# Patient Record
Sex: Female | Born: 1960 | Race: White | Hispanic: No | Smoking: Never smoker
Health system: Southern US, Community
[De-identification: ages and names within clinical notes are randomized; demographics above are authoritative.]

## PROBLEM LIST (undated history)

## (undated) DIAGNOSIS — M1990 Unspecified osteoarthritis, unspecified site: Secondary | ICD-10-CM

## (undated) DIAGNOSIS — C8590 Non-Hodgkin lymphoma, unspecified, unspecified site: Secondary | ICD-10-CM

## (undated) DIAGNOSIS — D649 Anemia, unspecified: Secondary | ICD-10-CM

## (undated) DIAGNOSIS — K219 Gastro-esophageal reflux disease without esophagitis: Secondary | ICD-10-CM

## (undated) DIAGNOSIS — M2550 Pain in unspecified joint: Secondary | ICD-10-CM

## (undated) DIAGNOSIS — L089 Local infection of the skin and subcutaneous tissue, unspecified: Principal | ICD-10-CM

## (undated) DIAGNOSIS — I10 Essential (primary) hypertension: Secondary | ICD-10-CM

## (undated) DIAGNOSIS — F411 Generalized anxiety disorder: Secondary | ICD-10-CM

## (undated) DIAGNOSIS — R591 Generalized enlarged lymph nodes: Secondary | ICD-10-CM

## (undated) DIAGNOSIS — F329 Major depressive disorder, single episode, unspecified: Secondary | ICD-10-CM

## (undated) DIAGNOSIS — F99 Mental disorder, not otherwise specified: Secondary | ICD-10-CM

## (undated) DIAGNOSIS — K5900 Constipation, unspecified: Secondary | ICD-10-CM

## (undated) DIAGNOSIS — M792 Neuralgia and neuritis, unspecified: Principal | ICD-10-CM

## (undated) DIAGNOSIS — F32A Depression, unspecified: Secondary | ICD-10-CM

## (undated) DIAGNOSIS — R51 Headache: Principal | ICD-10-CM

## (undated) DIAGNOSIS — J069 Acute upper respiratory infection, unspecified: Secondary | ICD-10-CM

## (undated) DIAGNOSIS — F419 Anxiety disorder, unspecified: Secondary | ICD-10-CM

## (undated) DIAGNOSIS — Z87442 Personal history of urinary calculi: Secondary | ICD-10-CM

## (undated) DIAGNOSIS — R599 Enlarged lymph nodes, unspecified: Secondary | ICD-10-CM

## (undated) DIAGNOSIS — R079 Chest pain, unspecified: Secondary | ICD-10-CM

## (undated) DIAGNOSIS — J45909 Unspecified asthma, uncomplicated: Secondary | ICD-10-CM

## (undated) DIAGNOSIS — B009 Herpesviral infection, unspecified: Secondary | ICD-10-CM

## (undated) DIAGNOSIS — E785 Hyperlipidemia, unspecified: Secondary | ICD-10-CM

## (undated) DIAGNOSIS — K2270 Barrett's esophagus without dysplasia: Secondary | ICD-10-CM

## (undated) DIAGNOSIS — E559 Vitamin D deficiency, unspecified: Secondary | ICD-10-CM

## (undated) HISTORY — DX: Acute upper respiratory infection, unspecified: J06.9

## (undated) HISTORY — DX: Hyperlipidemia, unspecified: E78.5

## (undated) HISTORY — DX: Local infection of the skin and subcutaneous tissue, unspecified: L08.9

## (undated) HISTORY — DX: Barrett's esophagus without dysplasia: K22.70

## (undated) HISTORY — DX: Essential (primary) hypertension: I10

## (undated) HISTORY — DX: Neuralgia and neuritis, unspecified: M79.2

## (undated) HISTORY — DX: Herpesviral infection, unspecified: B00.9

## (undated) HISTORY — DX: Constipation, unspecified: K59.00

## (undated) HISTORY — PX: CERVICAL CONIZATION W/BX: SHX1330

## (undated) HISTORY — DX: Anemia, unspecified: D64.9

## (undated) HISTORY — DX: Anxiety disorder, unspecified: F41.9

## (undated) HISTORY — DX: Pain in unspecified joint: M25.50

## (undated) HISTORY — DX: Non-Hodgkin lymphoma, unspecified, unspecified site: C85.90

## (undated) HISTORY — DX: Gastro-esophageal reflux disease without esophagitis: K21.9

## (undated) HISTORY — PX: TUBAL LIGATION: SHX77

## (undated) HISTORY — DX: Unspecified asthma, uncomplicated: J45.909

## (undated) HISTORY — DX: Headache: R51

## (undated) HISTORY — DX: Generalized anxiety disorder: F41.1

## (undated) HISTORY — DX: Vitamin D deficiency, unspecified: E55.9

## (undated) HISTORY — DX: Chest pain, unspecified: R07.9

---

## 1993-02-15 HISTORY — PX: CHOLECYSTECTOMY: SHX55

## 1993-02-15 HISTORY — PX: NISSEN FUNDOPLICATION: SHX2091

## 1996-03-30 ENCOUNTER — Encounter: Payer: Self-pay | Admitting: Gastroenterology

## 1998-09-09 ENCOUNTER — Other Ambulatory Visit: Admission: RE | Admit: 1998-09-09 | Discharge: 1998-09-09 | Payer: Self-pay | Admitting: Obstetrics and Gynecology

## 2000-01-12 ENCOUNTER — Other Ambulatory Visit: Admission: RE | Admit: 2000-01-12 | Discharge: 2000-01-12 | Payer: Self-pay | Admitting: Obstetrics and Gynecology

## 2000-01-12 ENCOUNTER — Encounter (INDEPENDENT_AMBULATORY_CARE_PROVIDER_SITE_OTHER): Payer: Self-pay | Admitting: Specialist

## 2000-02-16 HISTORY — PX: VAGINAL HYSTERECTOMY: SUR661

## 2000-03-16 ENCOUNTER — Encounter (INDEPENDENT_AMBULATORY_CARE_PROVIDER_SITE_OTHER): Payer: Self-pay

## 2000-03-16 ENCOUNTER — Observation Stay (HOSPITAL_COMMUNITY): Admission: RE | Admit: 2000-03-16 | Discharge: 2000-03-17 | Payer: Self-pay | Admitting: Obstetrics and Gynecology

## 2001-05-09 ENCOUNTER — Other Ambulatory Visit: Admission: RE | Admit: 2001-05-09 | Discharge: 2001-05-09 | Payer: Self-pay | Admitting: Family Medicine

## 2001-05-18 ENCOUNTER — Encounter: Payer: Self-pay | Admitting: Family Medicine

## 2001-05-18 ENCOUNTER — Encounter: Admission: RE | Admit: 2001-05-18 | Discharge: 2001-05-18 | Payer: Self-pay | Admitting: Family Medicine

## 2002-02-15 HISTORY — PX: INCISIONAL HERNIA REPAIR: SHX193

## 2002-05-17 ENCOUNTER — Ambulatory Visit (HOSPITAL_COMMUNITY): Admission: RE | Admit: 2002-05-17 | Discharge: 2002-05-18 | Payer: Self-pay | Admitting: Surgery

## 2004-05-18 ENCOUNTER — Ambulatory Visit: Payer: Self-pay | Admitting: Family Medicine

## 2004-05-18 ENCOUNTER — Other Ambulatory Visit: Admission: RE | Admit: 2004-05-18 | Discharge: 2004-05-18 | Payer: Self-pay | Admitting: Family Medicine

## 2004-05-27 ENCOUNTER — Ambulatory Visit: Payer: Self-pay

## 2005-04-28 ENCOUNTER — Encounter: Payer: Self-pay | Admitting: Family Medicine

## 2005-04-28 ENCOUNTER — Ambulatory Visit: Payer: Self-pay | Admitting: Family Medicine

## 2005-04-28 ENCOUNTER — Other Ambulatory Visit: Admission: RE | Admit: 2005-04-28 | Discharge: 2005-04-28 | Payer: Self-pay | Admitting: Family Medicine

## 2005-05-11 ENCOUNTER — Encounter: Admission: RE | Admit: 2005-05-11 | Discharge: 2005-05-11 | Payer: Self-pay | Admitting: Family Medicine

## 2006-03-14 ENCOUNTER — Ambulatory Visit: Payer: Self-pay | Admitting: Family Medicine

## 2006-04-26 ENCOUNTER — Encounter: Payer: Self-pay | Admitting: Family Medicine

## 2006-04-26 ENCOUNTER — Ambulatory Visit: Payer: Self-pay | Admitting: Family Medicine

## 2006-04-26 DIAGNOSIS — I10 Essential (primary) hypertension: Secondary | ICD-10-CM | POA: Insufficient documentation

## 2006-04-26 DIAGNOSIS — R51 Headache: Secondary | ICD-10-CM

## 2006-04-26 DIAGNOSIS — Z9889 Other specified postprocedural states: Secondary | ICD-10-CM

## 2006-04-26 DIAGNOSIS — R519 Headache, unspecified: Secondary | ICD-10-CM | POA: Insufficient documentation

## 2006-04-26 DIAGNOSIS — J45909 Unspecified asthma, uncomplicated: Secondary | ICD-10-CM

## 2006-05-27 ENCOUNTER — Encounter: Payer: Self-pay | Admitting: Family Medicine

## 2006-05-27 ENCOUNTER — Ambulatory Visit: Payer: Self-pay | Admitting: Family Medicine

## 2006-05-27 DIAGNOSIS — B009 Herpesviral infection, unspecified: Secondary | ICD-10-CM | POA: Insufficient documentation

## 2006-05-27 DIAGNOSIS — B351 Tinea unguium: Secondary | ICD-10-CM

## 2006-05-27 LAB — CONVERTED CEMR LAB
BUN: 16 mg/dL (ref 6–23)
CO2: 21 meq/L (ref 19–32)
Calcium: 8.8 mg/dL (ref 8.4–10.5)
Chloride: 103 meq/L (ref 96–112)
Creatinine, Ser: 0.93 mg/dL (ref 0.40–1.20)
Glucose, Bld: 81 mg/dL (ref 70–99)
Potassium: 4 meq/L (ref 3.5–5.3)
Sodium: 138 meq/L (ref 135–145)

## 2006-06-27 ENCOUNTER — Encounter: Payer: Self-pay | Admitting: Family Medicine

## 2006-06-27 ENCOUNTER — Ambulatory Visit: Payer: Self-pay | Admitting: Family Medicine

## 2006-06-27 DIAGNOSIS — S239XXA Sprain of unspecified parts of thorax, initial encounter: Secondary | ICD-10-CM

## 2006-10-20 ENCOUNTER — Ambulatory Visit: Payer: Self-pay | Admitting: Family Medicine

## 2006-10-20 DIAGNOSIS — M25569 Pain in unspecified knee: Secondary | ICD-10-CM

## 2006-10-21 ENCOUNTER — Encounter: Payer: Self-pay | Admitting: Family Medicine

## 2006-10-24 ENCOUNTER — Encounter (INDEPENDENT_AMBULATORY_CARE_PROVIDER_SITE_OTHER): Payer: Self-pay | Admitting: *Deleted

## 2007-05-03 ENCOUNTER — Telehealth (INDEPENDENT_AMBULATORY_CARE_PROVIDER_SITE_OTHER): Payer: Self-pay | Admitting: *Deleted

## 2007-05-09 ENCOUNTER — Ambulatory Visit: Payer: Self-pay | Admitting: Family Medicine

## 2007-05-09 DIAGNOSIS — F411 Generalized anxiety disorder: Secondary | ICD-10-CM | POA: Insufficient documentation

## 2007-06-09 ENCOUNTER — Ambulatory Visit: Payer: Self-pay | Admitting: Family Medicine

## 2007-11-07 ENCOUNTER — Ambulatory Visit: Payer: Self-pay | Admitting: Family Medicine

## 2007-11-07 DIAGNOSIS — J019 Acute sinusitis, unspecified: Secondary | ICD-10-CM | POA: Insufficient documentation

## 2007-11-20 ENCOUNTER — Encounter (INDEPENDENT_AMBULATORY_CARE_PROVIDER_SITE_OTHER): Payer: Self-pay | Admitting: *Deleted

## 2007-11-21 ENCOUNTER — Ambulatory Visit: Payer: Self-pay | Admitting: Family Medicine

## 2007-11-21 ENCOUNTER — Encounter: Payer: Self-pay | Admitting: Family Medicine

## 2007-11-21 ENCOUNTER — Other Ambulatory Visit: Admission: RE | Admit: 2007-11-21 | Discharge: 2007-11-21 | Payer: Self-pay | Admitting: Family Medicine

## 2007-11-21 DIAGNOSIS — Z780 Asymptomatic menopausal state: Secondary | ICD-10-CM | POA: Insufficient documentation

## 2007-11-30 ENCOUNTER — Encounter: Admission: RE | Admit: 2007-11-30 | Discharge: 2007-11-30 | Payer: Self-pay | Admitting: Family Medicine

## 2007-11-30 ENCOUNTER — Encounter (INDEPENDENT_AMBULATORY_CARE_PROVIDER_SITE_OTHER): Payer: Self-pay | Admitting: *Deleted

## 2007-12-04 ENCOUNTER — Encounter: Payer: Self-pay | Admitting: Family Medicine

## 2007-12-04 ENCOUNTER — Encounter: Admission: RE | Admit: 2007-12-04 | Discharge: 2007-12-04 | Payer: Self-pay | Admitting: Family Medicine

## 2007-12-04 ENCOUNTER — Telehealth (INDEPENDENT_AMBULATORY_CARE_PROVIDER_SITE_OTHER): Payer: Self-pay | Admitting: *Deleted

## 2007-12-05 ENCOUNTER — Ambulatory Visit: Payer: Self-pay | Admitting: Family Medicine

## 2007-12-05 DIAGNOSIS — Z8719 Personal history of other diseases of the digestive system: Secondary | ICD-10-CM

## 2007-12-05 DIAGNOSIS — N390 Urinary tract infection, site not specified: Secondary | ICD-10-CM

## 2007-12-05 LAB — CONVERTED CEMR LAB
Basophils Absolute: 0 10*3/uL (ref 0.0–0.1)
Basophils Relative: 0.3 % (ref 0.0–3.0)
Bilirubin Urine: NEGATIVE
Eosinophils Absolute: 0.2 10*3/uL (ref 0.0–0.7)
Eosinophils Relative: 1.6 % (ref 0.0–5.0)
Glucose, Urine, Semiquant: NEGATIVE
HCT: 38 % (ref 36.0–46.0)
Hemoglobin: 13.3 g/dL (ref 12.0–15.0)
Ketones, urine, test strip: NEGATIVE
Lymphocytes Relative: 21.6 % (ref 12.0–46.0)
MCHC: 34.9 g/dL (ref 30.0–36.0)
MCV: 85.9 fL (ref 78.0–100.0)
Monocytes Absolute: 0.9 10*3/uL (ref 0.1–1.0)
Monocytes Relative: 8.6 % (ref 3.0–12.0)
Neutro Abs: 6.7 10*3/uL (ref 1.4–7.7)
Neutrophils Relative %: 67.9 % (ref 43.0–77.0)
Nitrite: NEGATIVE
Platelets: 323 10*3/uL (ref 150–400)
Protein, U semiquant: NEGATIVE
RBC: 4.42 M/uL (ref 3.87–5.11)
RDW: 12.4 % (ref 11.5–14.6)
Specific Gravity, Urine: <1.005
Urobilinogen, UA: NEGATIVE
WBC: 10 10*3/uL (ref 4.5–10.5)
pH: 5.5

## 2007-12-06 ENCOUNTER — Encounter: Payer: Self-pay | Admitting: Family Medicine

## 2007-12-07 ENCOUNTER — Encounter (INDEPENDENT_AMBULATORY_CARE_PROVIDER_SITE_OTHER): Payer: Self-pay | Admitting: *Deleted

## 2007-12-08 DIAGNOSIS — D630 Anemia in neoplastic disease: Secondary | ICD-10-CM | POA: Insufficient documentation

## 2007-12-08 DIAGNOSIS — K219 Gastro-esophageal reflux disease without esophagitis: Secondary | ICD-10-CM

## 2007-12-12 ENCOUNTER — Encounter (INDEPENDENT_AMBULATORY_CARE_PROVIDER_SITE_OTHER): Payer: Self-pay | Admitting: *Deleted

## 2007-12-13 ENCOUNTER — Encounter: Payer: Self-pay | Admitting: Family Medicine

## 2007-12-13 ENCOUNTER — Ambulatory Visit: Payer: Self-pay | Admitting: Gastroenterology

## 2008-01-05 ENCOUNTER — Telehealth (INDEPENDENT_AMBULATORY_CARE_PROVIDER_SITE_OTHER): Payer: Self-pay | Admitting: *Deleted

## 2008-05-28 ENCOUNTER — Encounter: Admission: RE | Admit: 2008-05-28 | Discharge: 2008-05-28 | Payer: Self-pay | Admitting: Family Medicine

## 2008-10-17 ENCOUNTER — Ambulatory Visit: Payer: Self-pay | Admitting: Family Medicine

## 2008-12-02 ENCOUNTER — Encounter: Admission: RE | Admit: 2008-12-02 | Discharge: 2008-12-02 | Payer: Self-pay | Admitting: Family Medicine

## 2008-12-20 ENCOUNTER — Ambulatory Visit: Payer: Self-pay | Admitting: Family Medicine

## 2008-12-23 ENCOUNTER — Encounter: Payer: Self-pay | Admitting: Family Medicine

## 2009-01-06 ENCOUNTER — Encounter: Payer: Self-pay | Admitting: Family Medicine

## 2009-03-04 ENCOUNTER — Ambulatory Visit: Payer: Self-pay | Admitting: Family Medicine

## 2009-03-04 DIAGNOSIS — F329 Major depressive disorder, single episode, unspecified: Secondary | ICD-10-CM

## 2009-03-04 DIAGNOSIS — F324 Major depressive disorder, single episode, in partial remission: Secondary | ICD-10-CM | POA: Insufficient documentation

## 2009-06-17 ENCOUNTER — Ambulatory Visit: Payer: Self-pay | Admitting: Family Medicine

## 2009-08-19 ENCOUNTER — Telehealth (INDEPENDENT_AMBULATORY_CARE_PROVIDER_SITE_OTHER): Payer: Self-pay | Admitting: *Deleted

## 2009-09-16 ENCOUNTER — Telehealth: Payer: Self-pay | Admitting: Family Medicine

## 2009-11-27 ENCOUNTER — Telehealth: Payer: Self-pay | Admitting: Family Medicine

## 2009-12-09 ENCOUNTER — Encounter: Admission: RE | Admit: 2009-12-09 | Discharge: 2009-12-09 | Payer: Self-pay | Admitting: Family Medicine

## 2009-12-09 LAB — HM MAMMOGRAPHY: HM Mammogram: NORMAL

## 2009-12-22 ENCOUNTER — Ambulatory Visit: Payer: Self-pay | Admitting: Family Medicine

## 2009-12-22 ENCOUNTER — Encounter: Payer: Self-pay | Admitting: Family Medicine

## 2009-12-30 LAB — CONVERTED CEMR LAB
ALT: 34 units/L (ref 0–35)
AST: 28 units/L (ref 0–37)
Albumin: 3.7 g/dL (ref 3.5–5.2)
Alkaline Phosphatase: 112 units/L (ref 39–117)
BUN: 11 mg/dL (ref 6–23)
Basophils Absolute: 0 10*3/uL (ref 0.0–0.1)
Basophils Relative: 0.4 % (ref 0.0–3.0)
Bilirubin, Direct: 0.1 mg/dL (ref 0.0–0.3)
CO2: 28 meq/L (ref 19–32)
Calcium: 9.2 mg/dL (ref 8.4–10.5)
Chloride: 104 meq/L (ref 96–112)
Cholesterol: 194 mg/dL (ref 0–200)
Creatinine, Ser: 0.7 mg/dL (ref 0.4–1.2)
Eosinophils Absolute: 0.1 10*3/uL (ref 0.0–0.7)
Eosinophils Relative: 1.2 % (ref 0.0–5.0)
GFR calc non Af Amer: 95.29 mL/min (ref >60)
Glucose, Bld: 79 mg/dL (ref 70–99)
HCT: 39.9 % (ref 36.0–46.0)
HDL: 72.9 mg/dL (ref >39.00)
Hemoglobin: 13.5 g/dL (ref 12.0–15.0)
LDL Cholesterol: 109 mg/dL — ABNORMAL HIGH (ref 0–99)
Lymphocytes Relative: 21.1 % (ref 12.0–46.0)
Lymphs Abs: 1.3 10*3/uL (ref 0.7–4.0)
MCHC: 33.9 g/dL (ref 30.0–36.0)
MCV: 87 fL (ref 78.0–100.0)
Monocytes Absolute: 0.5 10*3/uL (ref 0.1–1.0)
Monocytes Relative: 8.6 % (ref 3.0–12.0)
Neutro Abs: 4.2 10*3/uL (ref 1.4–7.7)
Neutrophils Relative %: 68.7 % (ref 43.0–77.0)
Platelets: 304 10*3/uL (ref 150.0–400.0)
Potassium: 3.6 meq/L (ref 3.5–5.1)
RBC: 4.59 M/uL (ref 3.87–5.11)
RDW: 14.3 % (ref 11.5–14.6)
Sodium: 139 meq/L (ref 135–145)
TSH: 0.62 microintl units/mL (ref 0.35–5.50)
Total Bilirubin: 0.6 mg/dL (ref 0.3–1.2)
Total CHOL/HDL Ratio: 3
Total Protein: 6.3 g/dL (ref 6.0–8.3)
Triglycerides: 62 mg/dL (ref 0.0–149.0)
VLDL: 12.4 mg/dL (ref 0.0–40.0)
Vit D, 25-Hydroxy: 28 ng/mL — ABNORMAL LOW (ref 30–89)
WBC: 6.1 10*3/uL (ref 4.5–10.5)

## 2009-12-31 ENCOUNTER — Telehealth (INDEPENDENT_AMBULATORY_CARE_PROVIDER_SITE_OTHER): Payer: Self-pay | Admitting: *Deleted

## 2010-01-16 ENCOUNTER — Telehealth: Payer: Self-pay | Admitting: Family Medicine

## 2010-02-04 ENCOUNTER — Ambulatory Visit (HOSPITAL_COMMUNITY)
Admission: RE | Admit: 2010-02-04 | Discharge: 2010-02-04 | Payer: Self-pay | Source: Home / Self Care | Attending: Orthopedic Surgery | Admitting: Orthopedic Surgery

## 2010-02-15 HISTORY — PX: KNEE SURGERY: SHX244

## 2010-02-19 ENCOUNTER — Telehealth: Payer: Self-pay | Admitting: Family Medicine

## 2010-02-26 ENCOUNTER — Ambulatory Visit (HOSPITAL_COMMUNITY)
Admission: RE | Admit: 2010-02-26 | Discharge: 2010-02-26 | Payer: Self-pay | Source: Home / Self Care | Attending: Orthopedic Surgery | Admitting: Orthopedic Surgery

## 2010-03-02 LAB — BASIC METABOLIC PANEL
BUN: 10 mg/dL (ref 6–23)
CO2: 28 mEq/L (ref 19–32)
Calcium: 9.3 mg/dL (ref 8.4–10.5)
Chloride: 101 mEq/L (ref 96–112)
Creatinine, Ser: 0.73 mg/dL (ref 0.4–1.2)
GFR calc Af Amer: >60 mL/min (ref >60)
GFR calc non Af Amer: >60 mL/min (ref >60)
Glucose, Bld: 79 mg/dL (ref 70–99)
Potassium: 3.7 mEq/L (ref 3.5–5.1)
Sodium: 137 mEq/L (ref 135–145)

## 2010-03-02 LAB — SURGICAL PCR SCREEN
MRSA, PCR: NEGATIVE
Staphylococcus aureus: NEGATIVE

## 2010-03-08 ENCOUNTER — Encounter: Payer: Self-pay | Admitting: Family Medicine

## 2010-03-09 ENCOUNTER — Encounter: Payer: Self-pay | Admitting: Family Medicine

## 2010-03-15 LAB — CONVERTED CEMR LAB
ALT: 18 units/L (ref 0–35)
ALT: 20 units/L (ref 0–35)
ALT: 22 units/L (ref 0–35)
AST: 18 units/L (ref 0–37)
AST: 21 units/L (ref 0–37)
AST: 21 units/L (ref 0–37)
Albumin: 3.6 g/dL (ref 3.5–5.2)
Albumin: 3.7 g/dL (ref 3.5–5.2)
Albumin: 3.8 g/dL (ref 3.5–5.2)
Alkaline Phosphatase: 106 units/L (ref 39–117)
Alkaline Phosphatase: 89 units/L (ref 39–117)
Alkaline Phosphatase: 98 units/L (ref 39–117)
BUN: 8 mg/dL (ref 6–23)
BUN: 9 mg/dL (ref 6–23)
BUN: 9 mg/dL (ref 6–23)
Basophils Absolute: 0 10*3/uL (ref 0.0–0.1)
Basophils Absolute: 0 10*3/uL (ref 0.0–0.1)
Basophils Absolute: 0.1 10*3/uL (ref 0.0–0.1)
Basophils Relative: 0.2 % (ref 0.0–3.0)
Basophils Relative: 0.5 % (ref 0.0–3.0)
Basophils Relative: 0.8 % (ref 0.0–1.0)
Bilirubin, Direct: 0.1 mg/dL (ref 0.0–0.3)
Bilirubin, Direct: 0.1 mg/dL (ref 0.0–0.3)
Bilirubin, Direct: 0.1 mg/dL (ref 0.0–0.3)
CO2: 28 meq/L (ref 19–32)
CO2: 28 meq/L (ref 19–32)
CO2: 29 meq/L (ref 19–32)
Calcium: 8.7 mg/dL (ref 8.4–10.5)
Calcium: 9.1 mg/dL (ref 8.4–10.5)
Calcium: 9.2 mg/dL (ref 8.4–10.5)
Chloride: 103 meq/L (ref 96–112)
Chloride: 104 meq/L (ref 96–112)
Chloride: 108 meq/L (ref 96–112)
Cholesterol: 166 mg/dL (ref 0–200)
Cholesterol: 169 mg/dL (ref 0–200)
Cholesterol: 181 mg/dL (ref 0–200)
Creatinine, Ser: 0.7 mg/dL (ref 0.4–1.2)
Creatinine, Ser: 0.7 mg/dL (ref 0.4–1.2)
Creatinine, Ser: 0.8 mg/dL (ref 0.4–1.2)
Eosinophils Absolute: 0 10*3/uL (ref 0.0–0.7)
Eosinophils Absolute: 0.1 10*3/uL (ref 0.0–0.6)
Eosinophils Absolute: 0.1 10*3/uL (ref 0.0–0.7)
Eosinophils Relative: 0.4 % (ref 0.0–5.0)
Eosinophils Relative: 1.1 % (ref 0.0–5.0)
Eosinophils Relative: 1.7 % (ref 0.0–5.0)
GFR calc Af Amer: 117 mL/min
GFR calc Af Amer: 117 mL/min
GFR calc non Af Amer: 82.04 mL/min (ref >60)
GFR calc non Af Amer: 97 mL/min
GFR calc non Af Amer: 97 mL/min
Glucose, Bld: 80 mg/dL (ref 70–99)
Glucose, Bld: 88 mg/dL (ref 70–99)
Glucose, Bld: 89 mg/dL (ref 70–99)
HCT: 35.9 % — ABNORMAL LOW (ref 36.0–46.0)
HCT: 39.9 % (ref 36.0–46.0)
HCT: 40.8 % (ref 36.0–46.0)
HDL: 53.9 mg/dL (ref >39.0)
HDL: 61.7 mg/dL (ref >39.00)
HDL: 62 mg/dL (ref >39.0)
Hemoglobin: 12.4 g/dL (ref 12.0–15.0)
Hemoglobin: 13.5 g/dL (ref 12.0–15.0)
Hemoglobin: 13.7 g/dL (ref 12.0–15.0)
LDL Cholesterol: 106 mg/dL — ABNORMAL HIGH (ref 0–99)
LDL Cholesterol: 85 mg/dL (ref 0–99)
LDL Cholesterol: 96 mg/dL (ref 0–99)
Lymphocytes Relative: 11.6 % — ABNORMAL LOW (ref 12.0–46.0)
Lymphocytes Relative: 21.3 % (ref 12.0–46.0)
Lymphocytes Relative: 26.6 % (ref 12.0–46.0)
Lymphs Abs: 1.5 10*3/uL (ref 0.7–4.0)
MCHC: 33.5 g/dL (ref 30.0–36.0)
MCHC: 33.9 g/dL (ref 30.0–36.0)
MCHC: 34.6 g/dL (ref 30.0–36.0)
MCV: 84.7 fL (ref 78.0–100.0)
MCV: 87.8 fL (ref 78.0–100.0)
MCV: 89 fL (ref 78.0–100.0)
Monocytes Absolute: 0.4 10*3/uL (ref 0.1–1.0)
Monocytes Absolute: 0.5 10*3/uL (ref 0.1–1.0)
Monocytes Absolute: 0.5 10*3/uL (ref 0.2–0.7)
Monocytes Relative: 7 % (ref 3.0–12.0)
Monocytes Relative: 7.1 % (ref 3.0–12.0)
Monocytes Relative: 7.3 % (ref 3.0–11.0)
Neutro Abs: 3.8 10*3/uL (ref 1.4–7.7)
Neutro Abs: 5.1 10*3/uL (ref 1.4–7.7)
Neutro Abs: 5.9 10*3/uL (ref 1.4–7.7)
Neutrophils Relative %: 64.7 % (ref 43.0–77.0)
Neutrophils Relative %: 68.9 % (ref 43.0–77.0)
Neutrophils Relative %: 80.8 % — ABNORMAL HIGH (ref 43.0–77.0)
Pap Smear: NORMAL
Platelets: 299 10*3/uL (ref 150.0–400.0)
Platelets: 299 10*3/uL (ref 150–400)
Platelets: 343 10*3/uL (ref 150–400)
Potassium: 3.5 meq/L (ref 3.5–5.1)
Potassium: 3.5 meq/L (ref 3.5–5.1)
Potassium: 3.9 meq/L (ref 3.5–5.1)
RBC: 4.24 M/uL (ref 3.87–5.11)
RBC: 4.55 M/uL (ref 3.87–5.11)
RBC: 4.58 M/uL (ref 3.87–5.11)
RDW: 12.7 % (ref 11.5–14.6)
RDW: 12.7 % (ref 11.5–14.6)
RDW: 13.4 % (ref 11.5–14.6)
Sodium: 138 meq/L (ref 135–145)
Sodium: 138 meq/L (ref 135–145)
Sodium: 145 meq/L (ref 135–145)
TSH: 0.57 microintl units/mL (ref 0.35–5.50)
TSH: 0.59 microintl units/mL (ref 0.35–5.50)
TSH: 0.67 microintl units/mL (ref 0.35–5.50)
Total Bilirubin: 0.8 mg/dL (ref 0.3–1.2)
Total Bilirubin: 0.9 mg/dL (ref 0.3–1.2)
Total Bilirubin: 1.2 mg/dL (ref 0.3–1.2)
Total CHOL/HDL Ratio: 2.7
Total CHOL/HDL Ratio: 3
Total CHOL/HDL Ratio: 3.1
Total Protein: 6.6 g/dL (ref 6.0–8.3)
Total Protein: 6.6 g/dL (ref 6.0–8.3)
Total Protein: 6.7 g/dL (ref 6.0–8.3)
Triglycerides: 69 mg/dL (ref 0.0–149.0)
Triglycerides: 94 mg/dL (ref 0–149)
Triglycerides: 98 mg/dL (ref 0–149)
VLDL: 13.8 mg/dL (ref 0.0–40.0)
VLDL: 19 mg/dL (ref 0–40)
VLDL: 20 mg/dL (ref 0–40)
WBC: 5.8 10*3/uL (ref 4.5–10.5)
WBC: 7.2 10*3/uL (ref 4.5–10.5)
WBC: 7.4 10*3/uL (ref 4.5–10.5)

## 2010-03-19 NOTE — Progress Notes (Signed)
Summary: refill  Phone Note Refill Request Message from:  Fax from Pharmacy on January 16, 2010 4:00 PM  Refills Requested: Medication #1:  ALPRAZOLAM 0.5 MG TABS 1 by mouth 3 times per day as [facility] [address] fax fax [phone]  Initial call taken by: Okey Regal Spring,  January 16, 2010 4:01 PM  Follow-up for Phone Call        last seen 12/22/09 and filled 11/27/09. Please advise Follow-up by: Almeta Monas CMA Duncan Dull),  January 16, 2010 4:03 PM  Additional Follow-up for Phone Call Additional follow up Details #1::        ok to refillx1 Additional Follow-up by: Loreen Freud DO,  January 18, 2010 5:41 PM    Prescriptions: ALPRAZOLAM 0.5 MG TABS (ALPRAZOLAM) 1 by mouth 3 times per day as needed  #30 x 0   Entered by:   Almeta Monas CMA (AAMA)   Authorized by:   Loreen Freud DO   Signed by:   Almeta Monas CMA (AAMA) on 01/19/2010   Method used:   Printed then faxed to ...       [facility]  [address] ([facility]ail)       [address]       [place], [place]  [address]       Ph: [phone] or [phone]       Fax: [phone]   RxID:   [id number]

## 2010-03-19 NOTE — Assessment & Plan Note (Signed)
Summary: f/u med/kneepain//cbs   Vital Signs:  Patient profile:   50 year old female Weight:      241 pounds Pulse rate:   85 / minute Pulse rhythm:   regular BP sitting:   122 / 80  (left arm) Cuff size:   large  Vitals Entered By: Army Fossa CMA (Jun 17, 2009 10:37 AM) CC: Pt here to follow up on Prozac, right knee is bothering her- especially when she is on it alot.   History of Present Illness: Pt here for refill on prozac.  Pt states she is happy with dose and it is working well.  Pt also c/o R knee pain especially when she is on it for long periods.  Osteo biflex helps but she was not sure if it was ok to take.   No known injury.  Pt played softball in HS -- but no injury.    Current Medications (verified): 1)  Lisinopril-Hydrochlorothiazide 10-12.5 Mg  Tabs (Lisinopril-Hydrochlorothiazide) .Marland Kitchen.. 1 By Mouth Once Daily 2)  Valtrex 1 Gm  Tabs (Valacyclovir Hcl) .... As Directed 3)  Nasonex 50 Mcg/act Susp (Mometasone Furoate) .... 2 Sprays Each Nostril Once Daily As Needed 4)  Prilosec Otc 20 Mg Tbec (Omeprazole Magnesium) .... Take 1 Tablet By Mouth Once A Day 5)  Prozac 20 Mg Caps (Fluoxetine Hcl) .Marland Kitchen.. 1 By Mouth Once Daily  Allergies: 1)  ! Codeine  Past History:  Past medical, surgical, family and social histories (including risk factors) reviewed for relevance to current acute and chronic problems.  Past Medical History: Reviewed history from 03/04/2009 and no changes required. 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 Cholecystectomy with hernia repair Hysterectomy Nissen Funduplication cervical conization Tubal Ligation  Family History: Reviewed history from 12/13/2007 and no changes required. Family History of CAD Female 1st degree relative <63, M 2003 at 48yo Family Hsitory Headaches Family History Hypertension MGM-- breast cancer No FH of Colon Cancer:  Social History: Reviewed history from 12/13/2007 and no changes required. Married Never Smoked Alcohol use-yes-rare Regular exercise-no Drug use-no Occupation: [employer]  Review of Systems      See HPI  Physical Exam  General:  Well-developed,well-nourished,in no acute distress; alert,appropriate and cooperative throughout examination Msk:  --- R knee no joint tenderness, no joint warmth, and joint swelling.   Psych:  Oriented X3, normally interactive, good eye contact, not anxious appearing, and not depressed appearing.     Impression & Recommendations:  Problem # 1:  KNEE PAIN, RIGHT (ICD-719.46)  Orders: T-Knee Right 2 view (73560TC) Knee Orthosis Elastic Knee Cap (Z6109)  Discussed strengthening exercises, use of ice or heat, and medications.   Problem # 2:  DEPRESSION (ICD-311)  Her updated medication list for this problem includes:    Prozac 20 Mg Caps (Fluoxetine hcl) .Marland Kitchen... 1 by mouth once daily  Complete Medication List: 1)  Lisinopril-hydrochlorothiazide 10-12.5 Mg Tabs (Lisinopril-hydrochlorothiazide) .Marland Kitchen.. 1 by mouth once daily 2)  Valtrex 1 Gm Tabs (Valacyclovir hcl) .... As directed  3)  Nasonex 50 Mcg/act Susp (Mometasone furoate) .... 2 sprays each nostril once daily as needed 4)   Prilosec Otc 20 Mg Tbec (Omeprazole magnesium) .... Take 1 tablet by mouth once a day 5)  Prozac 20 Mg Caps (Fluoxetine hcl) .Marland Kitchen.. 1 by mouth once daily Prescriptions: PROZAC 20 MG CAPS (FLUOXETINE HCL) 1 by mouth once daily  #30 x 11   Entered and Authorized by:   Loreen Freud DO   Signed by:   Loreen Freud DO on 06/17/2009   Method used:   Electronically to        [facility] (retail)       [address]       [place], [place]  [address]       Ph: [phone] or [phone]       Fax: [phone]   RxID:   [id number]

## 2010-03-19 NOTE — Progress Notes (Signed)
Summary: Refill Request  Phone Note Refill Request Call back at [phone] Message from:  Pharmacy on February 19, 2010 8:15 AM  Refills Requested: Medication #1:  ALPRAZOLAM 0.5 MG TABS 1 by mouth 3 times per day as needed   Dosage confirmed as above?Dosage Confirmed   Supply Requested: 1 month   [facility] [address] [facility] [place]  Next Appointment Scheduled: none Initial call taken by: Harold Barban,  February 19, 2010 8:15 AM  Follow-up for Phone Call        last seen 12/22/09 and filled 01/19/10 please advise Follow-up by: Almeta Monas CMA Duncan Dull),  February 19, 2010 8:57 AM  Additional Follow-up for Phone Call Additional follow up Details #1::        refill x1  2 refills Additional Follow-up by: Loreen Freud DO,  February 19, 2010 9:52 AM    Prescriptions: ALPRAZOLAM 0.5 MG TABS (ALPRAZOLAM) 1 by mouth 3 times per day as needed  #30 x 2   Entered by:   Almeta Monas CMA (AAMA)   Authorized by:   Loreen Freud DO   Signed by:   Almeta Monas CMA (AAMA) on 02/19/2010   Method used:   Printed then faxed to ...       [facility] (retail)       [address]       [place], [place]  [address]       Ph: [phone] or [phone]       Fax: [phone]   RxID:   [id number]

## 2010-03-19 NOTE — Progress Notes (Signed)
Summary: knee pain referral  Phone Note Call from Patient Call back at Home Phone [phone]   Caller: Patient Summary of Call: PT states that she recently saw you for knee pain that has not gotten any better, PT would now like to be referred. pls advise...........Marland KitchenFelecia Deloach CMA  January 16, 2010 3:03 PM   Follow-up for Phone Call        ortho --referral put in Follow-up by: Loreen Freud DO,  January 16, 2010 3:08 PM  Additional Follow-up for Phone Call Additional follow up Details #1::        Phone Call Completed,Pt will be notified with appt when referral is complete Additional Follow-up by: Almeta Monas CMA Duncan Dull),  January 16, 2010 4:00 PM

## 2010-03-19 NOTE — Progress Notes (Signed)
Summary: Sleep Aids   Phone Note Call from Patient Call back at Home Phone [phone]   Caller: Patient Summary of Call: Patient is forgot to tell you at last OV that she has been having trouble sleeping at night if she is not using a sleep aid. She said she normally uses Tylenol PM or Ibuprofen PM or OTC sleep aid. She said without them she wakes up in the middle of the night and can't go back to sleep. She would like to know if you have any advise for her on this. Initial call taken by: Harold Barban,  December 31, 2009 9:43 AM  Follow-up for Phone Call        she can save a xanax for night time and take that ---has she tried that? Follow-up by: Loreen Freud DO,  December 31, 2009 9:45 AM  Additional Follow-up for Phone Call Additional follow up Details #1::        Left message to call back.Marland KitchenMarland KitchenHarold Barban  December 31, 2009 11:03 AM    Additional Follow-up for Phone Call Additional follow up Details #2::    Patient is aware and will try taking a Xanex at night.  Follow-up by: Harold Barban,  December 31, 2009 11:48 AM

## 2010-03-19 NOTE — Progress Notes (Signed)
Summary: ALPRAZOLAM   Phone Note Refill Request Message from:  Fax from Pharmacy on November 27, 2009 12:50 PM  Refills Requested: Medication #1:  ALPRAZOLAM 0.5 MG TABLET   Notes: TAKE 1 TABLET BY MOUTH 3 TIMES A DAY AS NEEDED [facility] [address], [place]    [phone]    THIS MED IS LISTED ON PATIENT'S DISCONTINUED LIST  Initial call taken by: Jerolyn Shin,  November 27, 2009 12:52 PM  Follow-up for Phone Call        Authorized 09/16/09 but not saved to med list. Last OV 06/17/09 last filled 09/16/09. Please advise Follow-up by: Almeta Monas CMA Duncan Dull),  November 27, 2009 4:17 PM  Additional Follow-up for Phone Call Additional follow up Details #1::        refill x1 Additional Follow-up by: Loreen Freud DO,  November 27, 2009 4:30 PM    New/Updated Medications: ALPRAZOLAM 0.5 MG TABS (ALPRAZOLAM) 1 by mouth 3 times per day as needed Prescriptions: ALPRAZOLAM 0.5 MG TABS (ALPRAZOLAM) 1 by mouth 3 times per day as needed  #30 x 0   Entered by:   Almeta Monas CMA (AAMA)   Authorized by:   Loreen Freud DO   Signed by:   Almeta Monas CMA (AAMA) on 11/27/2009   Method used:   Printed then faxed to ...       [facility]  [address] (retail)       [address]       [place], [place]  [address]       Ph: [phone] or [phone]       Fax: [phone]   RxID:   [id number]  Rx faxed, pt has been notified... Almeta Monas CMA Duncan Dull)  November 27, 2009 4:57 PM

## 2010-03-19 NOTE — Assessment & Plan Note (Signed)
Summary: discuss issues/kdc   Vital Signs:  Patient profile:   50 year old female Weight:      240 pounds Temp:     98.2 degrees F oral Pulse rate:   82 / minute Pulse rhythm:   regular BP sitting:   122 / 80  (left arm) Cuff size:   large  Vitals Entered By: Army Fossa CMA (March 04, 2009 10:51 AM) CC: discuss depression medication opitions. , Depressive symptoms   History of Present Illness:  Depressive symptoms      This is a 50 year old woman who presents with Depressive symptoms.  The symptoms began 3 months ago.  The patient reports depressed mood, loss of interest/pleasure, and insomnia, but denies significant weight loss, significant weight gain, hypersomnia, psychomotor agitation, and psychomotor retardation.  The patient also reports fatigue or loss of energy, feelings of worthlessness, and diminished concentration.  The patient denies indecisiveness, thoughts of death, thoughts of suicide, suicidal intent, and suicidal plans.  The patient reports the following psychosocial stressors: recent traumatic event.  Patient's past history includes family hx of depression.  The patient denies abnormally elevated mood, abnormally irritable mood, decreased need for sleep, increased talkativeness, distractibility, flight of ideas, increased goal-directed activity, and inflated self-esteem/ grandiosity. Pt husband has been out of work for over a year and pt may lose her job.    Preventive Screening-Counseling & Management  Alcohol-Tobacco     Alcohol drinks/day: <1     Alcohol type: beer1 x every 6 months     Smoking Status: never     Passive Smoke Exposure: yes  Caffeine-Diet-Exercise     Caffeine use/day: 5     Does Patient Exercise: no  Allergies: 1)  ! Codeine  Past History:  Past Medical History: Asthma Headache Hypertension Anxiety Current Problems:  Hx of ANEMIA, HX OF (ICD-V12.3) BARRETT'S ESOPHAGUS, HX OF (ICD-V12.79) GERD (ICD-530.81) RECTAL BLEEDING, HX  OF (ICD-V12.79) UTI (ICD-599.0) POSTMENOPAUSAL STATUS (ICD-V49.81) SINUSITIS- ACUTE-NOS (ICD-461.9) PREVENTIVE HEALTH CARE (ICD-V70.0) ANXIETY (ICD-300.00) KNEE PAIN, LEFT (ICD-719.46) PREVENTIVE HEALTH CARE (ICD-V70.0) SPRAIN/STRAIN, THORACIC REGION (ICD-847.1) HERPES SIMPLEX, UNCOMPLICATED (ICD-054.9) ONYCHOMYCOSIS, TOENAILS (ICD-110.1) FAMILY HISTORY OF CAD FEMALE 1ST DEGREE RELATIVE <60 (ICD-V16.49) UMBILICAL HERNIORRHAPHY, HX OF (ICD-V45.89) HYPERTENSION (ICD-401.9) HEADACHE (ICD-784.0) ASTHMA (ICD-493.90) Depression  Family History: Reviewed history from 12/13/2007 and no changes required. Family History of CAD Female 1st degree relative <88, M 2003 at 48yo Family Hsitory Headaches Family History Hypertension MGM-- breast cancer No FH of Colon Cancer:  Social History: Reviewed history from 12/13/2007 and no changes required. Married Never Smoked Alcohol use-yes-rare Regular exercise-no Drug use-no Occupation: [employer]  Review of Systems      See HPI  Physical Exam  General:  Well-developed,well-nourished,in no acute distress; alert,appropriate and cooperative throughout examination Psych:  Oriented X3, memory intact for recent and remote, normally interactive, and depressed affect.     Impression & Recommendations:  Problem # 1:  DEPRESSION (ICD-311)  The following medications were removed from the medication list:    Alprazolam 0.25 Mg Tabs (Alprazolam) .Marland Kitchen... 1 by mouth three times a day as needed Her updated medication list for this problem includes:    Prozac 20 Mg Caps (Fluoxetine hcl) .Marland Kitchen... 1 by mouth once daily  Complete Medication List: 1)  Lisinopril-hydrochlorothiazide 10-12.5 Mg Tabs (Lisinopril-hydrochlorothiazide) .Marland Kitchen.. 1 by mouth once daily 2)  Valtrex 1 Gm Tabs (Valacyclovir hcl) .... As directed 3)  Nasonex 50 Mcg/act Susp (Mometasone furoate) .... 2 sprays each nostril once daily as needed 4)  Prilosec Otc 20 Mg Tbec (Omeprazole  magnesium) .... Take 1 tablet by mouth once a day 5)  Prozac 20 Mg Caps (Fluoxetine hcl) .Marland Kitchen.. 1 by mouth once daily  Patient Instructions: 1)  rto 4-6 weeks for f/u Prescriptions: PROZAC 20 MG CAPS (FLUOXETINE HCL) 1 by mouth once daily  #30 x 2   Entered and Authorized by:   Loreen Freud DO   Signed by:   Loreen Freud DO on 03/04/2009   Method used:   Electronically to        [facility] (retail)       [address]       [place], [place]  [address]       Ph: [phone] or [phone]       Fax: [phone]   RxID:   [id number]

## 2010-03-19 NOTE — Progress Notes (Signed)
Summary: Rx for anxiety  Phone Note Call from Patient Call back at Home Phone [phone]   Caller: Patient Reason for Call: Talk to Doctor Summary of Call: Patient came in with husband to have his lab work and she is requesting to have some prescribed for her nerves since the recent dx of her husbands cancer. Please advise.   She also would like to know if she needs to up her dosage of Prozac from 20mg  to some thing higher?   [facility] [place]  Initial call taken by: Harold Barban,  August 19, 2009 11:33 AM  Follow-up for Phone Call        xanax 0.5 #60  1 by mouth three times a day as needed , no refills ok to up prozac to 40 mg daily Follow-up by: Loreen Freud DO,  August 19, 2009 12:03 PM  Additional Follow-up for Phone Call Additional follow up Details #1::        left message on voicemail to call back to office. Additional Follow-up by: Lucious Groves,  August 19, 2009 5:03 PM    Additional Follow-up for Phone Call Additional follow up Details #2::    Patient has already picked up rx.  Follow-up by: Harold Barban,  August 20, 2009 9:02 AM  New/Updated Medications: PROZAC 40 MG CAPS (FLUOXETINE HCL) 1po once daily ALPRAZOLAM 0.5 MG TABS (ALPRAZOLAM) 1 by mouth tid Prescriptions: ALPRAZOLAM 0.5 MG TABS (ALPRAZOLAM) 1 by mouth tid  #60 x 0   Entered by:   Lucious Groves   Authorized by:   Loreen Freud DO   Signed by:   Lucious Groves on 08/19/2009   Method used:   Telephoned to ...       [facility] (retail)       [address]       [place], [place]  [address]       Ph: [phone] or [phone]       Fax: [phone]   RxID:   [id number] PROZAC 40 MG CAPS (FLUOXETINE HCL) 1po once daily  #30 x 5   Entered and Authorized by:   Loreen Freud DO   Signed by:   Loreen Freud DO on 08/19/2009   Method used:   Electronically to        [facility] (retail)       [address]       [place], [place]  [address]       Ph:  [phone] or [phone]       Fax: [phone]   RxID:   [id number]

## 2010-03-19 NOTE — Assessment & Plan Note (Signed)
Summary: cpx/fasting//kn   Vital Signs:  Patient profile:   50 year old female Menstrual status:  hysterectomy Height:      66.75 inches Weight:      227.2 pounds BMI:     35.98 Temp:     97.8 degrees F oral Pulse rate:   76 / minute Pulse rhythm:   regular BP sitting:   120 / 90  (right arm) Cuff size:   large  Vitals Entered By: Almeta Monas CMA Duncan Dull) (December 22, 2009 8:36 AM) CC: CPX/Fasting     Menstrual Status hysterectomy Last PAP Result NEGATIVE FOR INTRAEPITHELIAL LESIONS OR MALIGNANCY.   History of Present Illness: Pt is here for cpe and labs.  Husband is going through chemo but is very sick with it so he may stop.  Pt was also demoted to packing table and it is bother her R knee.  Pt is wearing brace and using ice with no relief.  She is also taking osteobiflex with no relief.    Preventive Screening-Counseling & Management  Alcohol-Tobacco     Alcohol drinks/day: <1     Alcohol type: beer1 x every 6 months     Smoking Status: never     Passive Smoke Exposure: yes  Caffeine-Diet-Exercise     Caffeine use/day: 5     Does Patient Exercise: no  Hep-HIV-STD-Contraception     HIV Risk: no     Dental Visit-last 6 months no     Dental Care Counseling: to seek dental care; no dental care within six months     SBE monthly: yes     SBE Education/Counseling: to perform regular SBE     Sun Exposure-Excessive: rarely  Safety-Violence-Falls     Seat Belt Use: 100      Sexual History:  currently monogamous.        Drug Use:  never.        Blood Transfusions:  no.    Current Medications (verified): 1)  Lisinopril-Hydrochlorothiazide 20-25 Mg Tabs (Lisinopril-Hydrochlorothiazide) .Marland Kitchen.. 1 By Mouth Once Daily 2)  Valtrex 1 Gm  Tabs (Valacyclovir Hcl) .... As Directed 3)  Nasonex 50 Mcg/act Susp (Mometasone Furoate) .... 2 Sprays Each Nostril Once Daily As Needed 4)  Prilosec Otc 20 Mg Tbec (Omeprazole Magnesium) .... Take 1 Tablet By Mouth Once A Day 5)  Prozac  40 Mg Caps (Fluoxetine Hcl) .Marland Kitchen.. 1po Once Daily 6)  Alprazolam 0.5 Mg Tabs (Alprazolam) .Marland Kitchen.. 1 By Mouth 3 Times Per Day As Needed 7)  Pennsaid 1.5 % Soln (Diclofenac Sodium) .... 40 Gtts Per Knee Qid As Needed  Allergies (verified): 1)  ! Codeine  Past History:  Past Medical History: Last updated: 03/04/2009 Asthma Headache Hypertension Anxiety Current Problems:  Hx of ANEMIA, HX OF (ICD-V12.3) BARRETT'S ESOPHAGUS, HX OF (ICD-V12.79) GERD (ICD-530.81) RECTAL BLEEDING, HX OF (ICD-V12.79) UTI (ICD-599.0) POSTMENOPAUSAL STATUS (ICD-V49.81) SINUSITIS- ACUTE-NOS (ICD-461.9) PREVENTIVE HEALTH CARE (ICD-V70.0) ANXIETY (ICD-300.00) KNEE PAIN, LEFT (ICD-719.46) PREVENTIVE HEALTH CARE (ICD-V70.0) SPRAIN/STRAIN, THORACIC REGION (ICD-847.1) HERPES SIMPLEX, UNCOMPLICATED (ICD-054.9) ONYCHOMYCOSIS, TOENAILS (ICD-110.1) FAMILY HISTORY OF CAD FEMALE 1ST DEGREE RELATIVE <60 (ICD-V16.49) UMBILICAL HERNIORRHAPHY, HX OF (ICD-V45.89) HYPERTENSION (ICD-401.9) HEADACHE (ICD-784.0) ASTHMA (ICD-493.90) Depression  Family History: Last updated: 12/13/2007 Family History of CAD Female 1st degree relative <60, M 2003 at 48yo Family Hsitory Headaches Family History Hypertension MGM-- breast cancer No FH of Colon Cancer:  Social History: Last updated: 12/13/2007 Married Never Smoked Alcohol use-yes-rare Regular exercise-no Drug use-no Occupation: [employer]  Risk Factors: Alcohol Use: <1 (12/22/2009)  Caffeine Use: 5 (12/22/2009) Exercise: no (12/22/2009)  Risk Factors: Smoking Status: never (12/22/2009) Passive Smoke Exposure: yes (12/22/2009)  Past Surgical History: Cholecystectomy with hernia repair Hysterectomy   2006 Nissen Funduplication cervical conization Tubal Ligation  Family History: Reviewed history from 12/13/2007 and no changes required. Family History of CAD Female 1st degree relative <11, M 2003 at 50yo Family Hsitory Headaches Family History  Hypertension MGM-- breast cancer No FH of Colon Cancer:  Social History: Reviewed history from 12/13/2007 and no changes required. Married Never Smoked Alcohol use-yes-rare Regular exercise-no Drug use-no Occupation: [employer]  Review of Systems      See HPI General:  Denies chills, fatigue, fever, loss of appetite, malaise, sleep disorder, sweats, weakness, and weight loss. Eyes:  Denies blurring, discharge, double vision, eye irritation, eye pain, halos, itching, light sensitivity, red eye, vision loss-1 eye, and vision loss-both eyes; optho q2y. ENT:  Denies decreased hearing, difficulty swallowing, ear discharge, earache, hoarseness, nasal congestion, nosebleeds, postnasal drainage, ringing in ears, sinus pressure, and sore throat. CV:  Denies bluish discoloration of lips or nails, chest pain or discomfort, difficulty breathing at night, difficulty breathing while lying down, fainting, fatigue, leg cramps with exertion, lightheadness, near fainting, palpitations, shortness of breath with exertion, swelling of feet, swelling of hands, and weight gain. Resp:  Denies chest discomfort, chest pain with inspiration, cough, coughing up blood, excessive snoring, hypersomnolence, morning headaches, pleuritic, shortness of breath, sputum productive, and wheezing. GI:  Denies abdominal pain, bloody stools, change in bowel habits, constipation, dark tarry stools, diarrhea, excessive appetite, gas, hemorrhoids, indigestion, loss of appetite, nausea, vomiting, vomiting blood, and yellowish skin color. GU:  Denies abnormal vaginal bleeding, decreased libido, discharge, dysuria, genital sores, hematuria, incontinence, nocturia, urinary frequency, and urinary hesitancy. MS:  Complains of joint pain; denies joint redness, joint swelling, loss of strength, low back pain, mid back pain, muscle aches, muscle , cramps, muscle weakness, stiffness, and thoracic pain. Derm:  Denies changes in color of skin,  changes in nail beds, dryness, excessive perspiration, flushing, hair loss, insect bite(s), itching, lesion(s), poor wound healing, and rash. Neuro:  Denies brief paralysis, difficulty with concentration, disturbances in coordination, falling down, headaches, inability to speak, memory loss, numbness, poor balance, seizures, sensation of room spinning, tingling, tremors, visual disturbances, and weakness. Psych:  Denies alternate hallucination ( auditory/visual), anxiety, depression, easily angered, easily tearful, irritability, mental problems, panic attacks, sense of great danger, suicidal thoughts/plans, thoughts of violence, unusual visions or sounds, and thoughts /plans of harming others. Endo:  Denies cold intolerance, excessive hunger, excessive thirst, excessive urination, heat intolerance, polyuria, and weight change. Heme:  Denies abnormal bruising, bleeding, enlarge lymph nodes, fevers, pallor, and skin discoloration. Allergy:  Denies hives or rash, itching eyes, persistent infections, seasonal allergies, and sneezing.  Physical Exam  General:  Well-developed,well-nourished,in no acute distress; alert,appropriate and cooperative throughout examination Head:  Normocephalic and atraumatic without obvious abnormalities. No apparent alopecia or balding. Eyes:  vision grossly intact.  vision grossly intact, pupils equal, pupils round, pupils reactive to light, no injection, and no iris abnormalities.   Ears:  External ear exam shows no significant lesions or deformities.  Otoscopic examination reveals clear canals, tympanic membranes are intact bilaterally without bulging, retraction, inflammation or discharge. Hearing is grossly normal bilaterally. Nose:  External nasal examination shows no deformity or inflammation. Nasal mucosa are pink and moist without lesions or exudates. Mouth:  Oral mucosa and oropharynx without lesions or exudates.  Teeth in good repair. Neck:  No deformities,  masses,  or tenderness noted.no carotid bruits.  no carotid bruits.   Chest Wall:  No deformities, masses, or tenderness noted. Breasts:  No mass, nodules, thickening, tenderness, bulging, retraction, inflamation, nipple discharge or skin changes noted.   Lungs:  Normal respiratory effort, chest expands symmetrically. Lungs are clear to auscultation, no crackles or wheezes. Heart:  normal rate.  normal rate.   Abdomen:  Bowel sounds positive,abdomen soft and non-tender without masses, organomegaly or hernias noted. Genitalia:  s/p TAH Msk:  R knee swelling and tenderness no joint warmth, no redness over joints, and no crepitation.   Pulses:  R and L carotid,radial,femoral,dorsalis pedis and posterior tibial pulses are full and equal bilaterally Extremities:  No clubbing, cyanosis, edema, or deformity noted with normal full range of motion of all joints.   Neurologic:  No cranial nerve deficits noted. Station and gait are normal. Plantar reflexes are down-going bilaterally. DTRs are symmetrical throughout. Sensory, motor and coordinative functions appear intact. Skin:  Intact without suspicious lesions or rashes Cervical Nodes:  No lymphadenopathy noted Axillary Nodes:  No palpable lymphadenopathy Psych:  Cognition and judgment appear intact. Alert and cooperative with normal attention span and concentration. No apparent delusions, illusions, hallucinations Flu Vaccine Consent Questions     Do you have a history of severe allergic reactions to this vaccine? no    Any prior history of allergic reactions to egg and/or gelatin? no    Do you have a sensitivity to the preservative Thimersol? no    Do you have a past history of Guillan-Barre Syndrome? no    Do you currently have an acute febrile illness? no    Have you ever had a severe reaction to latex? no    Vaccine information given and explained to patient? yes    Are you currently pregnant? no    Lot Number:[id number]   Exp Date:08/15/2010   Site  Given  Left Deltoid IM   Impression & Recommendations:  Problem # 1:  PREVENTIVE HEALTH CARE (ICD-V70.0)  Orders: Venipuncture (30865) TLB-Lipid Panel (80061-LIPID) TLB-BMP (Basic Metabolic Panel-BMET) (80048-METABOL) TLB-CBC Platelet - w/Differential (85025-CBCD) TLB-Hepatic/Liver Function Pnl (80076-HEPATIC) TLB-TSH (Thyroid Stimulating Hormone) (84443-TSH) T-Vitamin D (25-Hydroxy) (78469-62952) Specimen Handling (84132) Specimen Handling (44010) EKG w/ Interpretation (93000)  Problem # 2:  KNEE PAIN, RIGHT (ICD-719.46)  pennsaid qid   Discussed strengthening exercises, use of ice or heat, and medications.   Problem # 3:  Hx of ANEMIA, HX OF (ICD-V12.3)  Orders: Venipuncture (27253) TLB-Lipid Panel (80061-LIPID) TLB-BMP (Basic Metabolic Panel-BMET) (80048-METABOL) TLB-CBC Platelet - w/Differential (85025-CBCD) TLB-Hepatic/Liver Function Pnl (80076-HEPATIC) TLB-TSH (Thyroid Stimulating Hormone) (84443-TSH) T-Vitamin D (25-Hydroxy) (66440-34742) Specimen Handling (59563) Specimen Handling (87564)  Problem # 4:  BARRETT'S ESOPHAGUS, HX OF (ICD-V12.79)  Problem # 5:  GERD (ICD-530.81)  Her updated medication list for this problem includes:    Prilosec Otc 20 Mg Tbec (Omeprazole magnesium) .Marland Kitchen... Take 1 tablet by mouth once a day  Diagnostics Reviewed:  Discussed lifestyle modifications, diet, antacids/medications, and preventive measures. Handout provided.   Problem # 6:  POSTMENOPAUSAL STATUS (ICD-V49.81)  Orders: Venipuncture (33295) TLB-Lipid Panel (80061-LIPID) TLB-BMP (Basic Metabolic Panel-BMET) (80048-METABOL) TLB-CBC Platelet - w/Differential (85025-CBCD) TLB-Hepatic/Liver Function Pnl (80076-HEPATIC) TLB-TSH (Thyroid Stimulating Hormone) (84443-TSH) T-Vitamin D (25-Hydroxy) (18841-66063) Specimen Handling (01601) Specimen Handling (09323)  Problem # 7:  ANXIETY (ICD-300.00)  Her updated medication list for this problem includes:    Prozac 40 Mg  Caps (Fluoxetine hcl) .Marland Kitchen... 1po once daily    Alprazolam 0.5 Mg Tabs (Alprazolam) .Marland KitchenMarland KitchenMarland KitchenMarland Kitchen  1 by mouth 3 times per day as needed  Discussed medication use and relaxation techniques.   Problem # 8:  HYPERTENSION (ICD-401.9)  Her updated medication list for this problem includes:    Lisinopril-hydrochlorothiazide 20-25 Mg Tabs (Lisinopril-hydrochlorothiazide) .Marland Kitchen... 1 by mouth once daily  Orders: Venipuncture (86578) TLB-Lipid Panel (80061-LIPID) TLB-BMP (Basic Metabolic Panel-BMET) (80048-METABOL) TLB-CBC Platelet - w/Differential (85025-CBCD) TLB-Hepatic/Liver Function Pnl (80076-HEPATIC) TLB-TSH (Thyroid Stimulating Hormone) (84443-TSH) T-Vitamin D (25-Hydroxy) (46962-95284) Specimen Handling (13244) Specimen Handling (01027)  BP today: 120/90 Prior BP: 122/80 (06/17/2009)  Prior 10 Yr Risk Heart Disease: Not enough information (04/26/2006)  Labs Reviewed: K+: 3.9 (12/20/2008) Creat: : 0.8 (12/20/2008)   Chol: 181 (12/20/2008)   HDL: 61.70 (12/20/2008)   LDL: 106 (12/20/2008)   TG: 69.0 (12/20/2008)  Problem # 9:  ASTHMA (ICD-493.90)  Complete Medication List: 1)  Lisinopril-hydrochlorothiazide 20-25 Mg Tabs (Lisinopril-hydrochlorothiazide) .Marland Kitchen.. 1 by mouth once daily 2)  Valtrex 1 Gm Tabs (Valacyclovir hcl) .... As directed 3)  Nasonex 50 Mcg/act Susp (Mometasone furoate) .... 2 sprays each nostril once daily as needed 4)  Prilosec Otc 20 Mg Tbec (Omeprazole magnesium) .... Take 1 tablet by mouth once a day 5)  Prozac 40 Mg Caps (Fluoxetine hcl) .Marland Kitchen.. 1po once daily 6)  Alprazolam 0.5 Mg Tabs (Alprazolam) .Marland Kitchen.. 1 by mouth 3 times per day as needed 7)  Pennsaid 1.5 % Soln (Diclofenac sodium) .... 40 gtts per knee qid as needed  Other Orders: Admin 1st Vaccine (25366) Flu Vaccine 50yrs + (44034) Prescriptions: LISINOPRIL-HYDROCHLOROTHIAZIDE 20-25 MG TABS (LISINOPRIL-HYDROCHLOROTHIAZIDE) 1 by mouth once daily  #90 x 3   Entered and Authorized by:   Loreen Freud DO   Signed by:    Loreen Freud DO on 12/22/2009   Method used:   [facility] to        [facility] [id number]       [address]       [place], [place]  [address]       Ph: [phone] or [phone]       Fax: [phone]   RxID:   [id number]    Orders Added: 1)  Admin 1st Vaccine [90471] 2)  Flu Vaccine 59yrs + [62831] 3)  Venipuncture [51761] 4)  TLB-Lipid Panel [80061-LIPID] 5)  TLB-BMP (Basic Metabolic Panel-BMET) [80048-METABOL] 6)  TLB-CBC Platelet - w/Differential [85025-CBCD] 7)  TLB-Hepatic/Liver Function Pnl [80076-HEPATIC] 8)  TLB-TSH (Thyroid Stimulating Hormone) [84443-TSH] 9)  T-Vitamin D (25-Hydroxy) [60737-10626] 10)  Specimen Handling [99000] 11)  Specimen Handling [99000] 12)  Est. Patient 40-64 years [99396] 13)  EKG w/ Interpretation [93000]    Last Flu Vaccine:  Fluvax 3+ (12/20/2008 8:31:36 AM) Flu Vaccine Result Date:  12/22/2009 Flu Vaccine Result:  given Flu Vaccine Next Due:  1 yr Last Mammogram:  ASSESSMENT: Negative - BI-RADS 1^MM DIGITAL SCREENING (12/09/2009 7:40:00 AM) Mammogram Result Date:  12/09/2009 Mammogram Result:  normal Mammogram Next Due:  1 yr

## 2010-03-19 NOTE — Progress Notes (Signed)
Summary: Alprazolam refill  Phone Note Refill Request Message from:  Fax from Pharmacy on September 16, 2009 2:01 PM  Refills Requested: Medication #1:  ALPRAZOLAM 0.5 MG TABS 1 by [facility] [phone]  Initial call taken by: Okey Regal Spring,  September 16, 2009 2:02 PM  Follow-up for Phone Call        refill x1 Follow-up by: Loreen Freud DO,  September 16, 2009 7:47 PM    New/Updated Medications: ALPRAZOLAM 0.5 MG TABS (ALPRAZOLAM) 1 by mouth three times a day as needed ALPRAZOLAM 0.5 MG TABS (ALPRAZOLAM) 1 by mouth three times a day as needed Prescriptions: ALPRAZOLAM 0.5 MG TABS (ALPRAZOLAM) 1 by mouth three times a day as needed  #60 x 0   Entered by:   Lucious Groves CMA   Authorized by:   Loreen Freud DO   Signed by:   Lucious Groves CMA on 09/17/2009   Method used:   Telephoned to ...       [facility]       [address]       [place], [place]  [address]       Ph: [phone] or [phone]       Fax: [phone]   RxID:   [id number] ALPRAZOLAM 0.5 MG TABS (ALPRAZOLAM) 1 by mouth three times a day as needed  #60 x 0   Entered and Authorized by:   Loreen Freud DO   Signed by:   Loreen Freud DO on 09/16/2009   Method used:   Historical   RxID:   [id number]

## 2010-05-21 ENCOUNTER — Other Ambulatory Visit: Payer: Self-pay

## 2010-05-21 MED ORDER — ALPRAZOLAM 0.5 MG PO TABS: 0.5000 mg | ORAL_TABLET | Freq: Three times a day (TID) | ORAL | Status: DC | PRN

## 2010-05-21 NOTE — Telephone Encounter (Signed)
FAXED     KP 

## 2010-05-21 NOTE — Telephone Encounter (Signed)
Last seen 12/22/09 and filled 04/10/10    Please advise      KP

## 2010-06-12 ENCOUNTER — Ambulatory Visit: Payer: Self-pay | Admitting: Family Medicine

## 2010-06-24 ENCOUNTER — Other Ambulatory Visit: Payer: Self-pay

## 2010-06-24 MED ORDER — ALPRAZOLAM 0.5 MG PO TABS: 0.5000 mg | ORAL_TABLET | Freq: Three times a day (TID) | ORAL | Status: DC | PRN

## 2010-06-24 NOTE — Telephone Encounter (Signed)
Faxed.   KP 

## 2010-06-24 NOTE — Telephone Encounter (Signed)
Last seen 01/01/10 and filled 05/21/10--- please advise      KP

## 2010-06-27 ENCOUNTER — Encounter: Payer: Self-pay | Admitting: Family Medicine

## 2010-06-30 ENCOUNTER — Ambulatory Visit (INDEPENDENT_AMBULATORY_CARE_PROVIDER_SITE_OTHER): Payer: PRIVATE HEALTH INSURANCE | Admitting: Family Medicine

## 2010-06-30 ENCOUNTER — Encounter: Payer: Self-pay | Admitting: Family Medicine

## 2010-06-30 VITALS — BP 120/70 | HR 75 | Temp 97.6°F | Wt 240.0 lb

## 2010-06-30 DIAGNOSIS — N949 Unspecified condition associated with female genital organs and menstrual cycle: Secondary | ICD-10-CM

## 2010-06-30 DIAGNOSIS — K219 Gastro-esophageal reflux disease without esophagitis: Secondary | ICD-10-CM

## 2010-06-30 DIAGNOSIS — K649 Unspecified hemorrhoids: Secondary | ICD-10-CM

## 2010-06-30 DIAGNOSIS — F329 Major depressive disorder, single episode, unspecified: Secondary | ICD-10-CM

## 2010-06-30 DIAGNOSIS — K2270 Barrett's esophagus without dysplasia: Secondary | ICD-10-CM

## 2010-06-30 DIAGNOSIS — R102 Pelvic and perineal pain: Secondary | ICD-10-CM

## 2010-06-30 DIAGNOSIS — R195 Other fecal abnormalities: Secondary | ICD-10-CM

## 2010-06-30 LAB — CBC WITH DIFFERENTIAL/PLATELET
Basophils Absolute: 0 10*3/uL (ref 0.0–0.1)
Eosinophils Absolute: 0.1 10*3/uL (ref 0.0–0.7)
Hemoglobin: 13 g/dL (ref 12.0–15.0)
Lymphocytes Relative: 28.4 % (ref 12.0–46.0)
MCHC: 34.1 g/dL (ref 30.0–36.0)
Neutro Abs: 4 10*3/uL (ref 1.4–7.7)
Neutrophils Relative %: 62.6 % (ref 43.0–77.0)
Platelets: 288 10*3/uL (ref 150.0–400.0)
RDW: 13.7 % (ref 11.5–14.6)

## 2010-06-30 LAB — BASIC METABOLIC PANEL
BUN: 14 mg/dL (ref 6–23)
CO2: 29 mEq/L (ref 19–32)
Calcium: 8.8 mg/dL (ref 8.4–10.5)
Creatinine, Ser: 0.7 mg/dL (ref 0.4–1.2)
Glucose, Bld: 70 mg/dL (ref 70–99)

## 2010-06-30 LAB — POCT URINALYSIS DIPSTICK
Glucose, UA: NEGATIVE
Ketones, UA: NEGATIVE
Nitrite, UA: NEGATIVE

## 2010-06-30 LAB — HEPATIC FUNCTION PANEL
Albumin: 3.4 g/dL — ABNORMAL LOW (ref 3.5–5.2)
Alkaline Phosphatase: 98 U/L (ref 39–117)

## 2010-06-30 MED ORDER — OMEPRAZOLE MAGNESIUM 20 MG PO TBEC: 20.0000 mg | DELAYED_RELEASE_TABLET | Freq: Every day | ORAL | Status: DC

## 2010-06-30 MED ORDER — BUPROPION HCL ER (XL) 150 MG PO TB24: 150.0000 mg | ORAL_TABLET | ORAL | Status: DC

## 2010-06-30 MED ORDER — HYDROCORTISONE 2.5 % RE CREA: TOPICAL_CREAM | Freq: Two times a day (BID) | RECTAL | Status: AC

## 2010-06-30 NOTE — Assessment & Plan Note (Signed)
Refer to gi.  

## 2010-06-30 NOTE — Assessment & Plan Note (Signed)
con't prilosec Hx barretts esophagus

## 2010-06-30 NOTE — Assessment & Plan Note (Signed)
proctosol cream S[employer] bath [employer] fiber in diet Drink more water

## 2010-06-30 NOTE — Assessment & Plan Note (Signed)
con't prozac Start wellbutrin rto 1 month or sooner prn

## 2010-06-30 NOTE — Progress Notes (Signed)
  Subjective:    Patient ID: Anna Jackson, female    DOB: 1961/02/15, 50 y.o.   MRN: [id number]  HPI Pt here c/o ? Rectal bleeding and ? Hemorrhoids.  No otc treatment. Pt also c/o increased irritability and stress at home with weight gain. Pt is not suicidal.   Review of Systems  All other systems reviewed and are negative.       Objective:   Physical Exam  Constitutional: She appears well-developed and well-nourished.  Pulmonary/Chest: Effort normal and breath sounds normal.  Abdominal: Soft. Bowel sounds are normal. She exhibits no distension and no mass. There is no tenderness. There is no rebound and no guarding.  Genitourinary: Guaiac positive stool.       + ext hemorrhoids Heme + stool   Psychiatric: She has a normal mood and affect. Her behavior is normal. Judgment and thought content normal.          Assessment & Plan:

## 2010-06-30 NOTE — Patient Instructions (Signed)
Hemorrhoids Hemorrhoids are dilated (enlarged) veins around the rectum. Sometimes clots will form in the veins. This makes them swollen and painful. These are called thrombosed hemorrhoids. Causes of hemorrhoids include:  Pregnancy: this increases the pressure in the hemorrhoidal veins.   Constipation.   Straining to have a bowel movement.  HOME CARE INSTRUCTIONS  Eat a well balanced diet and drink 6 to 8 glasses of water every day to avoid constipation. You may also use a bulk laxative.   Avoid straining to have bowel movements.   Keep anal area dry and clean.   Only take over-the-counter or prescription medicines for pain, discomfort, or fever as directed by your caregiver.  If thrombosed:  Take hot sitz baths for 20 to 30 minutes, 3 to 4 times per day.   If the hemorrhoids are very tender and swollen, place ice packs on area as tolerated. Using ice packs between sitz baths may be helpful. Fill a plastic bag with ice and use a towel between the bag of ice and your skin.   Special creams and suppositories (Anusol, Nupercainal, Wyanoids) may be used or applied as directed.   Do not use a donut shaped pillow or sit on the toilet for long periods. This increases blood pooling and pain.   Move your bowels when your body has the urge; this will require less straining and will decrease pain and pressure.   Only take over-the-counter or prescription medicines for pain, discomfort, or fever as directed by your caregiver.  SEEK MEDICAL CARE IF:  You have increasing pain and swelling that is not controlled with your prescription.   You have uncontrolled bleeding.   You have an inability or difficulty having a bowel movement.   You have pain or inflammation outside the area of the hemorrhoids.   You have chills and/or an oral temperature above 100.4 that lasts for 2 days or longer, or as your caregiver suggests.  MAKE SURE YOU:   Understand these instructions.   Will watch your  condition.   Will get help right away if you are not doing well or get worse.  Document Released: 01/30/2000 Document Re-Released: 01/15/2008 [employer] Patient Information 2011 [employer]. 

## 2010-07-01 ENCOUNTER — Encounter: Payer: Self-pay | Admitting: Family Medicine

## 2010-07-02 LAB — URINE CULTURE: Colony Count: 2000

## 2010-07-03 NOTE — Op Note (Signed)
NAME:  Anna Jackson, Anna Jackson                     ACCOUNT NO.:  [id number]   MEDICAL RECORD NO.:  [id number]                   PATIENT TYPE:  OIB   LOCATION:  2550                                 FACILITY:  [facility]   PHYSICIAN:  Abigail Miyamoto, M.D.              DATE OF BIRTH:  March 26, 1960   DATE OF PROCEDURE:  05/17/2002  DATE OF DISCHARGE:                                 OPERATIVE REPORT   PREOPERATIVE DIAGNOSIS:  Ventral incisional hernia.   POSTOPERATIVE DIAGNOSIS:  Ventral incisional hernia.   OPERATION PERFORMED:  Laparoscopic ventral incisional hernia repair with  mesh (15 x 20 cm Parietex).   SURGEON:  Douglas A. Magnus Ivan, M.D.   ASSISTANT:  Ollen Gross. Carolynne Edouard, M.D.   ANESTHESIA:  General endotracheal.   ESTIMATED BLOOD LOSS:  Minimal.   DESCRIPTION OF PROCEDURE:  The patient was brought to the operating room and  identified.  She was placed supine on the operating table and general  anesthesia was induced.  Her abdomen was then prepped and draped in the  usual sterile fashion.  Using a #15 blade, a small lateral incision was made  in the patient's right flank.  The incision was carried down through the  fascia which was opened with the electrocautery.  The underlying muscle  plane was then split and the peritoneum was then identified and opened with  Metzenbaums.  A 0 Vicryl pursestring suture was then placed around the  fascial opening.  The Hasson port was placed medially and insufflation of  the abdomen was begun.  Upon entering the abdomen, the patient was found to  have a large amount of omentum stuck up to the abdominal wall and into the  ventral hernia defect.  A 5 mm port was then placed in the patient's right  upper quadrant as well as the left upper quadrant.  Extensive lysis of  adhesions was then carried out with the blunt grasper as well as with the  Harmonic scalpel until all the omentum was freed up from the abdominal wall.  At this point the hernia defect  could be identified at the old midline  incision.  All of the contents had been reduced out.  At this point the  edges of the hernia were measured and it became apparent that a 15 x 20 cm  piece of mesh would be needed.  A piece of Parietex mesh was then brought  onto the field.  Four separate 0 Novofil sutures were then placed into the  mesh.  The mesh was placed into the abdominal cavity.  Four separate skin  incisions were then made and the suture passer was used to pull the sutures  up through the abdominal wall, pulling the mesh up to the abdominal wall and  securing it in place.  The surgical tacker was then used to tack in the mesh  circumferentially to the abdominal wall.  Excellent coverage of the hernia  defect  appeared to be achieved.  At this point again, the abdomen was  examined and hemostasis was found to be achieved.  All ports were then  removed under direct vision and the abdomen was deflated.  The mesh appeared  to be lying appropriately and the abdominal cavity was relieved of its  carbon dioxide.  The 0 Vicryl was then tied in place closing the fascial  defect at the camera port.  All incisions were anesthetized with 0.25%  Marcaine and then closed with 4-0 Monocryl subcuticular sutures.  Steri-  Strips, gauze and tape were then applied.  The patient tolerated the  procedure well.  All sponge, needle and instrument counts were correct at  the end of the procedure.  The patient was then extubated in the operating  room and taken in stable condition to the recovery room.                                                   Abigail Miyamoto, M.D.    DB/MEDQ  D:  05/17/2002  T:  05/18/2002  Job:  [id number]

## 2010-07-03 NOTE — H&P (Signed)
[facility]  Patient:    Anna Jackson, Anna Jackson                    MRN: [id number] Adm. Date:  03/16/00 Attending:  Janine Limbo, M.D.                         History and Physical  HISTORY OF PRESENT ILLNESS:   Ms. Westergren is a 50 year old female, para 1-0-2-1, who presents for vaginal hysterectomy.  She complains of worsening dysmenorrhea and irregular bleeding.  She has tried hormonal therapy in the past without success.  Nonsteroidal anti-inflammatory agents have not relieved her discomfort.  An endometrial biopsy showed benign endometrium.  An ultrasound showed an 8.1 x 4.9 cm uterus.  The endometrium measured 7.5 mm. The ovaries appeared normal.  Her TSH and her prolactin are both normal.  Her most recent Pap smear was in November 2001, and it was within normal limits. She denies a prior history of sexually transmitted infections.  She did have a laparoscopic tubal cautery in 1996.  The uterus, fallopian tubes, and ovaries appeared normal at that time.  OBSTETRICAL HISTORY:          The patient has had one term vaginal delivery and two miscarriages.  PAST MEDICAL HISTORY:         The patient has a history of cholelithiasis, and a hiatal hernia was also mentioned.  She is status post laparoscopic cholecystectomy.  She has a history of a grief reaction associated with her mothers death.  She complains of PMS type symptoms.  SOCIAL HISTORY:               The patient drinks alcohol socially.  She denies cigarette use and recreational drug use.  DRUG ALLERGIES:               None.  REVIEW OF SYSTEMS:             See History of Present Illness.  FAMILY HISTORY:               Noncontributory.  PHYSICAL EXAMINATION:  VITAL SIGNS:                  Weight 244 pounds.  HEENT:                        Within normal limits.  CHEST:                        Clear.  HEART:                        Regular rate and rhythm.  BREASTS:                       Without masses.  ABDOMEN:                      Nontender.  EXTREMITIES:                  Within normal limits.  NEUROLOGIC:                   Grossly normal.  PELVIC:                       External genitalia is normal.  The vagina  is normal.  Cervix is nontender, no lesions.  Uterus is upper limits normal size. Adnexa: No masses.  Rectovaginal exam confirms.  ASSESSMENT:                   1. Menorrhagia.                               2. Dysmenorrhea.  PLAN:                         The patient will undergo a vaginal hysterectomy. She understands the indications for her procedure as well as the alternative therapy.  She accepts the risks of, but not limited to, anesthetic complications, bleeding, infections, and possible damage to the surrounding organs. DD:  03/13/00 TD:  03/13/00 Job: [id number]

## 2010-07-03 NOTE — Op Note (Signed)
[facility]  Patient:    Anna Jackson, Anna Jackson                  MRN: [id number] Proc. Date: 03/16/00 Adm. Date:  [id number] Attending:  Leonard Schwartz                           Operative Report  PREOPERATIVE DIAGNOSES:       1. Menorrhagia.                               2. Dysmenorrhea.                               3. Anemia.  POSTOPERATIVE DIAGNOSES:      1. Menorrhagia.                               2. Dysmenorrhea.                               3. Anemia.  OPERATION:                    Vaginal hysterectomy.  SURGEON:                      Janine Limbo, M.D.  ASSISTANT:                    Henreitta Leber, P.A.  ANESTHESIA:                   General.  INDICATIONS:                  [name] is a 50 year old female, para 1-0-2-1, who presents with the above-mentioned diagnosis.  Nonsteroidal anti-inflammatory agents and hormonal therapy have not relieved her discomfort.  The patient is ready to proceed with definitive therapy.  The patient understands the indications for her procedure, and she accepts the risks of, but not limited to, anesthetic complications, bleeding, infection, and possible damage to the surrounding organs.  FINDINGS:                     The uterus was upper limits of normal size.  The fallopian tubes and ovaries appeared normal.  DESCRIPTION OF PROCEDURE:     The patient was taken to the operating room, where a general anesthetic was given.  The patients abdomen, perineum, and vagina were prepped with multiple layers of Betadine.  A  Foley catheter was placed in the bladder.  Examination under anesthesia was performed.  The patient was sterilely draped.  The cervix was injected with a diluted solution of Pitressin and saline.  A circumferential incision was made around the cervix, and the anterior and then posterior cul-de-sacs were sharply entered. Alternating from right to left, the uterosacral  ligaments, paracervical tissues, parametrial tissues, uterine arteries, and upper pedicles were clamped, cut, sutured, and tied securely.  The uterus was inverted through the posterior colpotomy, and the remainder of the upper pedicles were clamped and cut.  The uterus was removed from the operative field.  The upper pedicles were secured using a free tie and then a tie of suture ligature.  The sutures attached to the uterosacral ligaments were brought out through the vaginal angles and tied securely.  A McCall culdoplasty stitch was placed in the posterior cul-de-sac, incorporating the posterior peritoneum and the uterosacral ligaments bilaterally.  Hemostasis was confirmed throughout.  The vaginal cuff was then closed using figure-of-eight sutures, incorporating the anterior vaginal mucosa, the anterior peritoneum, the posterior peritoneum, and then the posterior vaginal mucosa.  The McCall culdoplasty suture was tied securely, and the apex of the vagina was noted to elevate into the mid-pelvis. Vicryl 0 was the suture material used throughout the procedure.  Sponge, needle, and instrument counts were correct on two occasions.  The estimated blood loss was 250 cc.  The patient tolerated her procedure well. She was noted to drain clear yellow urine at the end of her procedure.  She was taken to the recovery room in stable condition. DD:  03/16/00 TD:  03/16/00 Job: [id number]

## 2010-07-24 ENCOUNTER — Other Ambulatory Visit: Payer: Self-pay | Admitting: Family Medicine

## 2010-07-24 MED ORDER — ALPRAZOLAM 0.5 MG PO TABS: 0.5000 mg | ORAL_TABLET | Freq: Three times a day (TID) | ORAL | Status: DC | PRN

## 2010-07-24 NOTE — Telephone Encounter (Signed)
Last filled 5/9 and last office visit 5/11   

## 2010-07-24 NOTE — Telephone Encounter (Signed)
Rx faxed.    KP 

## 2010-07-24 NOTE — Telephone Encounter (Signed)
Refill x1 

## 2010-07-24 NOTE — Telephone Encounter (Signed)
Last filled 5/9 and last office visit 5/11

## 2010-08-10 ENCOUNTER — Encounter: Payer: Self-pay | Admitting: Gastroenterology

## 2010-08-10 ENCOUNTER — Ambulatory Visit (INDEPENDENT_AMBULATORY_CARE_PROVIDER_SITE_OTHER): Payer: PRIVATE HEALTH INSURANCE | Admitting: Gastroenterology

## 2010-08-10 DIAGNOSIS — K2270 Barrett's esophagus without dysplasia: Secondary | ICD-10-CM | POA: Insufficient documentation

## 2010-08-10 DIAGNOSIS — K649 Unspecified hemorrhoids: Secondary | ICD-10-CM

## 2010-08-10 DIAGNOSIS — K219 Gastro-esophageal reflux disease without esophagitis: Secondary | ICD-10-CM

## 2010-08-10 DIAGNOSIS — R195 Other fecal abnormalities: Secondary | ICD-10-CM

## 2010-08-10 MED ORDER — HYDROCORTISONE ACETATE 25 MG RE SUPP: 25.0000 mg | Freq: Two times a day (BID) | RECTAL | Status: DC

## 2010-08-10 MED ORDER — PEG-KCL-NACL-NASULF-NA ASC-C 100 G PO SOLR: 1.0000 | Freq: Once | ORAL | Status: DC

## 2010-08-10 MED ORDER — OMEPRAZOLE-SODIUM BICARBONATE 40-1100 MG PO CAPS: ORAL_CAPSULE | ORAL | Status: DC

## 2010-08-10 NOTE — Assessment & Plan Note (Addendum)
She remains symptomatically despite local therapy.  Recommendations #1 band ligation of her hemorrhoids

## 2010-08-10 NOTE — Assessment & Plan Note (Signed)
Plan upper endoscopy

## 2010-08-10 NOTE — Assessment & Plan Note (Signed)
She continues to have primarily nocturnal GERD.  Indications #1 begin Zegerid 40 mg each bedtime (discontinue Prilosec)

## 2010-08-10 NOTE — Assessment & Plan Note (Addendum)
This most likely is due to hemorrhoidal bleeding. A more  proximal colon bleeding should be ruled out.  Recommendations #1 colonoscopy

## 2010-08-10 NOTE — Patient Instructions (Signed)
Your Endoscopy is scheduled on 08/24/2010 at 3pm in [facility] 4th floor You colonoscopy with banding is scheduled on 10/02/2010 at 8:30am

## 2010-08-10 NOTE — Progress Notes (Signed)
History of Present Illness:  Anna Jackson is a 50 year old white female referred at the request of Dr. Laury Axon for evaluation of rectal bleeding and Barrett's esophagus. Barrett's was diagnosed before 2000 which was her last endoscopy. She was seen 4 years ago for limited rectal bleeding. Both colonoscopy and upper endoscopy were recommended but not done. She continues to complain of intermittent rectal bleeding and discomfort which she attributes to hemorrhoids. There is been no change in bowel habits. Rectal bleeding is fairly minimal and characterized by drops of blood in the  water or on the tissue. She has frequent pyrosis, especially at night. She denies dysphagia.    Review of Systems: Pertinent positive and negative review of systems were noted in the above HPI section. All other review of systems were otherwise negative.    Current Medications, Allergies, Past Medical History, Past Surgical History, Family History and Social History were reviewed in [employer] electronic medical record  Vital signs were reviewed in today's medical record. Physical Exam: General: Well developed , well nourished, no acute distress Head: Normocephalic and atraumatic Eyes:  sclerae anicteric, EOMI Ears: Normal auditory acuity Mouth: No deformity or lesions Lungs: Clear throughout to auscultation Heart: Regular rate and rhythm; no murmurs, rubs or bruits Abdomen: Soft, non tender and non distended. No masses, hepatosplenomegaly or hernias noted. Normal Bowel sounds Rectal: There is a small external hemorrhoid which is reducible Musculoskeletal: Symmetrical with no gross deformities  Pulses:  Normal pulses noted Extremities: No clubbing, cyanosis, edema or deformities noted Neurological: Alert oriented x 4, grossly nonfocal Psychological:  Alert and cooperative. Normal mood and affect

## 2010-08-11 ENCOUNTER — Encounter: Payer: Self-pay | Admitting: Gastroenterology

## 2010-08-12 ENCOUNTER — Ambulatory Visit (AMBULATORY_SURGERY_CENTER): Payer: PRIVATE HEALTH INSURANCE | Admitting: Gastroenterology

## 2010-08-12 ENCOUNTER — Encounter: Payer: Self-pay | Admitting: Gastroenterology

## 2010-08-12 VITALS — BP 119/88 | HR 69 | Temp 97.6°F | Resp 16 | Ht 68.0 in | Wt 238.0 lb

## 2010-08-12 DIAGNOSIS — K2270 Barrett's esophagus without dysplasia: Secondary | ICD-10-CM

## 2010-08-12 MED ORDER — SODIUM CHLORIDE 0.9 % IV SOLN: 500.0000 mL | INTRAVENOUS | Status: DC

## 2010-08-12 NOTE — Progress Notes (Signed)
PT GIVEN BENADRYL PER DR KAPLAN DUE TO DIFFICULTY TO SEDATE . PT ALERT TALKATIVE AND NOT DROWSY AT ALL AFTER FENTANYL AND VERSED 6 MG.  MEDS TITRATED PER MD. Scherrie November RN

## 2010-08-12 NOTE — Patient Instructions (Addendum)
Barrett's Esophagus The esophagus is the muscular tube that carries food and saliva from the mouth to the stomach. Barrett's esophagus involves changes in the esophagus. Some of its lining is replaced by a type of tissue similar to that found in the intestine. This process is called intestinal metaplasia. While Barrett's esophagus may cause no symptoms itself, a small number of people with this condition develop a relatively rare but often deadly type of cancer of the esophagus. It is called esophageal adenocarcinoma. Barrett's esophagus is associated with the common condition called GERD (gastroesophageal reflux disease). HOW THE ESOPHAGUS WORKS The esophagus carries food, liquids, and saliva from the mouth to the stomach. The stomach acts as a container to start digestion and pump food and liquids into the intestines in a controlled process. Food can then be properly digested over time. Nutrients can be taken in (absorbed) by the intestines. The esophagus moves food to the stomach by coordinated contractions of its muscular lining. This process is automatic. People are usually not aware of it. Many people have felt their esophagus when they:  Swallow something too large.   Try to eat too quickly.   Drink very hot or cold liquids.  They then feel the movement of the food or drink down the esophagus into the stomach. This may be an uncomfortable feeling. DIGESTIVE TRACT The muscular layers of the esophagus are normally pinched together at both the upper and lower ends by muscles. These muscles are called sphincters. When a person swallows, the sphincters relax automatically. This allows food or drink to pass from the mouth, into the stomach. The muscles then close rapidly. This prevents the swallowed food or drink from leaking out of the stomach, back into the esophagus or into the mouth. These muscles make it possible to swallow while lying down or even upside-down. When people belch to release  swallowed air or gas from carbonated beverages, the sphincters relax. Then small amounts of food or drink may come back up, briefly. This condition is called reflux. The esophagus quickly squeezes the material back into the stomach. This is considered normal. These functions of the esophagus are an important part of everyday life. However, people who must have their esophagus removed, for example because of cancer, can live a relatively healthy life without it. GERD (GASTROESOPHAGEAL REFLUX DISEASE) Having some stomach contents (liquids or gas) sometimes reflux (come back up from the stomach into the esophagus) is considered normal. When it happens often, and causes other symptoms, it is considered a medical problem or disease. However, it is not necessarily a serious one or one that requires seeing a caregiver. The stomach produces acid and enzymes to digest food. When this mixture refluxes into the esophagus more often than normal or for a longer period of time than normal, it may produce symptoms. These symptoms are often called acid reflux. They are often described by people as heartburn, indigestion, or "gas". The symptoms often consist of a burning sensation below and behind the lower part of the breastbone or sternum. Almost everyone has experienced these symptoms at least once. This is typically a result of overeating. Other things that provoke GERD symptoms include:  Being overweight.   Eating certain types of foods.   Being pregnant.  In most people, GERD symptoms may last only a short time and require no treatment at all.  More continual symptoms are often quickly relieved by over-the-counter acid-reducing agents, such as antacids. Other drugs used to relieve GERD symptoms are antisecretory drugs,  such as histamine2 (H2) blockers or proton pump inhibitors. People who have symptoms often should talk with their caregiver. Other diseases can have similar symptoms. Prescription medicines,  together with other actions, might be needed to reduce reflux. GERD that is untreated over a long period can lead to problems. An example is an ulcer in the esophagus, that could cause bleeding. Another common problem is scar tissue that blocks the movement of swallowed food and drink through the esophagus. This condition is called stricture. Esophageal reflux may also cause certain less common symptoms. These include hoarseness or chronic cough. It sometimes provokes conditions such as asthma. While most patients find that lifestyle changes and acid-blocking drugs relieve their symptoms, caregivers sometimes advise surgery. Overall, GERD is one of the most common medical conditions. About 20 percent of the population can be affected over a lifetime. GERD AND BARRETT'S ESOPHAGUS The exact causes of Barrett's esophagus are not known. It is thought to be caused in part by the same factors that cause GERD. People who do not have heartburn can have Barrett's esophagus. However, it is found about 3 to 5 times more often in people with this condition. Barrett's esophagus is uncommon in children. The average age at diagnosis is 32. But it is usually difficult to know when the problem started. It is about twice as common in men as in women. It is much more common in white men than in men of other races. BARRETT'S ESOPHAGUS AND CANCER OF THE ESOPHAGUS Barrett's esophagus does not cause symptoms itself. However, it seems to precede the development of a particular kind of cancer. This cancer is esophageal adenocarcinoma. The risk of developing this cancer is 30 to 125 times higher in people who have Barrett's esophagus than in people who do not. This type of cancer is increasing quickly in white men. The increase is possibly related to the rise in obesity and GERD. For people who have Barrett's esophagus, the risk of getting cancer of the esophagus is small. It is less than 1 percent (0.4 percent to 0.5 percent) per  year. Esophageal adenocarcinoma is often not curable. This is partly because the disease is often discovered at a late stage and treatments are not effective. DIAGNOSIS (HOW TO TELL WHAT IS WRONG) AND SCREENING Diagnosing Barrett's esophagus is not easy. At the present time it cannot be diagnosed based on symptoms, physical exam, or blood tests. The only useful test is upper gastrointestinal endoscopy and biopsy. In this procedure, a flexible tube called an endoscope is used. This tool is like a pencil sized flexible telescope. It has a light and tiny camera. It is passed into the esophagus. If the tissue appears suspicious to your caregiver, biopsies must be done. A biopsy is the removal of a small piece of tissue. This is done using a pincher-like device passed through the endoscope. A pathologist is a specialist who examines body tissue samples. He or she examines the tissue under a microscope to confirm the diagnosis. Looking for a medical problem in people who do not know whether they have one is called screening. Currently, there are no commonly accepted guidelines on who should have endoscopy, to check for Barrett's esophagus. There are many reasons for the lack of firm recommendations about screening. Among them are the great cost and occasional risk of side effects of the test. Also, the rate of finding Barrett's esophagus is low. Finding the problem early has not been proven to prevent deaths from cancer. Many caregivers advise that  adult patients who are over the age of 50 and have had GERD symptoms for a number of years have endoscopy, to see whether they have Barrett's esophagus. Screening for this condition in people who have no symptoms is not advised. TREATMENT Barrett's esophagus has no cure, other than surgical removal of the esophagus. This is a serious operation. Surgery is advised only for people who have a high risk of developing cancer or who already have it. Most caregivers recommend  treating GERD with acid-blocking drugs. This is sometimes linked to improvement in the extent of the Barrett's tissue. But this approach has not been proven to reduce the risk of cancer. Treating reflux with surgery for GERD also does not seem to cure Barrett's esophagus. Several experimental approaches are under study. One attempts to see whether destroying the Barrett's tissue by heat or other means, through an endoscope, can get rid of the condition. But this approach has potential risks and unknown effectiveness. LOOKING FOR DYSPLASIA AND CANCER Occasional endoscopic examinations to look for early warning signs of cancer are generally advised for people who have Barrett's esophagus. This approach is called surveillance. When people who have Barrett's esophagus develop cancer, the process seems to go through an intermediate stage. In this stage cancer cells appear in the Barrett's tissue. This condition is called dysplasia. It can be seen only in biopsies with a microscope. The process is patchy and cannot be seen directly through the endoscope. So, multiple biopsies must be taken. Even then, it can be missed. The process of change from Barrett's to cancer seems to happen only in a few patients. It is less than 1 percent per year. And it happens over a relatively long period of time. Most caregivers advise that patients with Barrett's esophagus undergo occasional endoscopy to have biopsies. The recommended time between endoscopies varies depending on circumstances. The best time interval has not been decided. TREATMENT FOR DYSPLASIA OR ESOPHAGEAL ADENOCARCINOMA If a person with Barrett's esophagus is found to have dysplasia or cancer, the caregiver will usually recommend surgery. This is if the person is strong enough and has a good chance of being cured. The type of surgery may vary. But it usually involves removing most of the esophagus and pulling the stomach up into the chest to attach it to what  remains of the esophagus. Many patients with Barrett's esophagus are elderly. They may have many other medical problems that make surgery unwise. In these patients, other methods to treat dysplasia are being studied. HOPE THROUGH RESEARCH Many important questions about Barrett's esophagus need further research to:  Find better ways to identify people who have the problem.   Find out what causes it.   Test treatments that may prevent or get rid of it.   Find better treatments for people who have Barrett's esophagus with cancer.  The [employer], and the [employer], sponsor research programs to study Barrett's esophagus. IMPORTANT POINTS TO REMEMBER  In Barrett's esophagus, the cells lining the esophagus change. They become similar to the cells lining the intestine.   Barrett's esophagus is connected with gastroesophageal reflux disease or GERD.   A small number of people with Barrett's esophagus may develop esophageal cancer.   Barrett's esophagus is diagnosed by upper gastrointestinal endoscopy and biopsy.   People who have Barrett's esophagus should have periodic esophageal exams.   Taking acid-blocking drugs for GERD may help improve Barrett's esophagus.   Removal of the esophagus is  recommended only for people who have a high risk of developing cancer or who already have it.  ADDITIONAL RESOURCES For more information about GERD or Barrett's esophagus, contact: [employer] ([employer] [address] [place], [place] [address] Phone: [phone] or [phone] Fax: [phone] Email: iffgd@iffgd .org Internet: www.iffgd.[facility] [employer] [employer] Information [facility] [address] [place], [place] [address] Email: nddic@info .[email or web address].si Document Released: 04/24/2003 Document Re-Released: 07/22/2009 [facility] Patient Information 2011  [place], [place].  Please review discharge instruction

## 2010-08-13 ENCOUNTER — Telehealth: Payer: Self-pay | Admitting: *Deleted

## 2010-08-13 NOTE — Telephone Encounter (Signed)

## 2010-08-24 ENCOUNTER — Other Ambulatory Visit: Payer: PRIVATE HEALTH INSURANCE | Admitting: Gastroenterology

## 2010-08-25 ENCOUNTER — Telehealth: Payer: Self-pay | Admitting: Gastroenterology

## 2010-08-25 NOTE — Telephone Encounter (Signed)
Reviewed the path results and Dr Marzetta Board letter with her.  Advised her she should be getting it in the mail in a day or two.  She is asked to call back for any further questions or problems.

## 2010-09-03 ENCOUNTER — Other Ambulatory Visit: Payer: Self-pay | Admitting: Family Medicine

## 2010-09-03 MED ORDER — ALPRAZOLAM 0.5 MG PO TABS: 0.5000 mg | ORAL_TABLET | Freq: Three times a day (TID) | ORAL | Status: DC | PRN

## 2010-09-03 NOTE — Telephone Encounter (Signed)
Last seen 06/30/10 and filled 07/24/10 please advise    KP

## 2010-09-03 NOTE — Telephone Encounter (Signed)
Faxed.   KP 

## 2010-09-18 ENCOUNTER — Other Ambulatory Visit: Payer: Self-pay | Admitting: Family Medicine

## 2010-09-18 NOTE — Telephone Encounter (Signed)
Last seen 06/30/10 and filled 5/25     KP

## 2010-10-02 ENCOUNTER — Other Ambulatory Visit: Payer: PRIVATE HEALTH INSURANCE | Admitting: Gastroenterology

## 2010-10-02 ENCOUNTER — Ambulatory Visit (HOSPITAL_COMMUNITY)
Admission: RE | Admit: 2010-10-02 | Discharge: 2010-10-02 | Disposition: A | Discharge status: Home / Self Care | Payer: PRIVATE HEALTH INSURANCE | Source: Ambulatory Visit | Attending: Gastroenterology | Admitting: Gastroenterology

## 2010-10-02 DIAGNOSIS — K625 Hemorrhage of anus and rectum: Secondary | ICD-10-CM

## 2010-10-02 DIAGNOSIS — K648 Other hemorrhoids: Secondary | ICD-10-CM | POA: Insufficient documentation

## 2010-10-20 ENCOUNTER — Other Ambulatory Visit: Payer: Self-pay | Admitting: Family Medicine

## 2010-10-20 MED ORDER — FLUOXETINE HCL 40 MG PO CAPS: 40.0000 mg | ORAL_CAPSULE | Freq: Every day | ORAL | Status: DC

## 2010-10-20 NOTE — Telephone Encounter (Signed)
Faxed.   KP 

## 2010-10-23 ENCOUNTER — Other Ambulatory Visit: Payer: Self-pay | Admitting: Family Medicine

## 2010-10-23 MED ORDER — ALPRAZOLAM 0.5 MG PO TABS: 0.5000 mg | ORAL_TABLET | Freq: Three times a day (TID) | ORAL | Status: DC | PRN

## 2010-10-23 NOTE — Telephone Encounter (Signed)
K p

## 2010-10-29 ENCOUNTER — Ambulatory Visit: Payer: PRIVATE HEALTH INSURANCE | Admitting: Internal Medicine

## 2010-10-31 ENCOUNTER — Other Ambulatory Visit: Payer: Self-pay | Admitting: Family Medicine

## 2010-11-04 ENCOUNTER — Ambulatory Visit (INDEPENDENT_AMBULATORY_CARE_PROVIDER_SITE_OTHER): Payer: PRIVATE HEALTH INSURANCE | Admitting: Gastroenterology

## 2010-11-04 ENCOUNTER — Encounter: Payer: Self-pay | Admitting: Gastroenterology

## 2010-11-04 DIAGNOSIS — K649 Unspecified hemorrhoids: Secondary | ICD-10-CM

## 2010-11-04 DIAGNOSIS — K2270 Barrett's esophagus without dysplasia: Secondary | ICD-10-CM

## 2010-11-04 DIAGNOSIS — R195 Other fecal abnormalities: Secondary | ICD-10-CM

## 2010-11-04 NOTE — Assessment & Plan Note (Signed)
Limited rectal bleeding was due to her hemorrhoids.

## 2010-11-04 NOTE — Assessment & Plan Note (Signed)
No evidence for dysplasia I endoscopy 2012.  Recommendations #1 follow endoscopy in 3 years

## 2010-11-04 NOTE — Progress Notes (Signed)
Anna Jackson has returned following band ligation of her hemorrhoids. Symptoms have subsided. Specifically, she is without discomfort or rectal bleeding.  Endoscopy in June, 2012 for history of Barrett's esophagus demonstrated changes consistent with Barrett's esophagus. Biopsies did not demonstrate dysplasia.

## 2010-11-04 NOTE — Patient Instructions (Signed)
Call back as needed 

## 2010-11-04 NOTE — Assessment & Plan Note (Signed)
Asymptomatic following band ligation 

## 2010-11-06 ENCOUNTER — Other Ambulatory Visit: Payer: Self-pay | Admitting: Family Medicine

## 2010-11-06 MED ORDER — ALPRAZOLAM 0.5 MG PO TABS: 0.5000 mg | ORAL_TABLET | Freq: Three times a day (TID) | ORAL | Status: DC | PRN

## 2010-11-06 NOTE — Telephone Encounter (Signed)
Last seen 06/30/10 and filled 10/23/10 please advise    KP

## 2010-11-06 NOTE — Telephone Encounter (Signed)
Faxed.   KP 

## 2010-11-16 ENCOUNTER — Other Ambulatory Visit: Payer: Self-pay | Admitting: Family Medicine

## 2010-11-16 DIAGNOSIS — Z1231 Encounter for screening mammogram for malignant neoplasm of breast: Secondary | ICD-10-CM

## 2010-11-23 ENCOUNTER — Encounter: Payer: Self-pay | Admitting: Family Medicine

## 2010-11-23 ENCOUNTER — Ambulatory Visit (INDEPENDENT_AMBULATORY_CARE_PROVIDER_SITE_OTHER): Payer: PRIVATE HEALTH INSURANCE | Admitting: Family Medicine

## 2010-11-23 VITALS — BP 124/86 | HR 77 | Temp 97.9°F | Ht 67.5 in | Wt 236.2 lb

## 2010-11-23 DIAGNOSIS — F411 Generalized anxiety disorder: Secondary | ICD-10-CM

## 2010-11-23 DIAGNOSIS — R319 Hematuria, unspecified: Secondary | ICD-10-CM

## 2010-11-23 DIAGNOSIS — Z780 Asymptomatic menopausal state: Secondary | ICD-10-CM

## 2010-11-23 DIAGNOSIS — Z23 Encounter for immunization: Secondary | ICD-10-CM

## 2010-11-23 DIAGNOSIS — F419 Anxiety disorder, unspecified: Secondary | ICD-10-CM

## 2010-11-23 DIAGNOSIS — I10 Essential (primary) hypertension: Secondary | ICD-10-CM

## 2010-11-23 DIAGNOSIS — Z0000 Encounter for general adult medical examination without abnormal findings: Secondary | ICD-10-CM

## 2010-11-23 LAB — CBC WITH DIFFERENTIAL/PLATELET
Basophils Relative: 0.5 % (ref 0.0–3.0)
Eosinophils Relative: 1.4 % (ref 0.0–5.0)
Hemoglobin: 13.1 g/dL (ref 12.0–15.0)
Lymphocytes Relative: 23 % (ref 12.0–46.0)
MCHC: 33 g/dL (ref 30.0–36.0)
Monocytes Relative: 7.9 % (ref 3.0–12.0)
Neutro Abs: 4.5 10*3/uL (ref 1.4–7.7)
Neutrophils Relative %: 67.2 % (ref 43.0–77.0)
RBC: 4.52 Mil/uL (ref 3.87–5.11)
WBC: 6.8 10*3/uL (ref 4.5–10.5)

## 2010-11-23 LAB — HEPATIC FUNCTION PANEL
ALT: 13 U/L (ref 0–35)
AST: 16 U/L (ref 0–37)
Albumin: 3.8 g/dL (ref 3.5–5.2)
Alkaline Phosphatase: 125 U/L — ABNORMAL HIGH (ref 39–117)
Bilirubin, Direct: 0.1 mg/dL (ref 0.0–0.3)
Total Protein: 7.2 g/dL (ref 6.0–8.3)

## 2010-11-23 LAB — POCT URINALYSIS DIPSTICK
Leukocytes, UA: NEGATIVE
Nitrite, UA: NEGATIVE
Urobilinogen, UA: 0.2
pH, UA: 6

## 2010-11-23 LAB — BASIC METABOLIC PANEL
CO2: 30 mEq/L (ref 19–32)
Calcium: 9 mg/dL (ref 8.4–10.5)
GFR: 74.85 mL/min (ref >60.00)
Sodium: 140 mEq/L (ref 135–145)

## 2010-11-23 LAB — TSH: TSH: 0.62 u[IU]/mL (ref 0.35–5.50)

## 2010-11-23 MED ORDER — BUPROPION HCL ER (XL) 150 MG PO TB24: ORAL_TABLET | ORAL | Status: DC

## 2010-11-23 MED ORDER — LISINOPRIL-HYDROCHLOROTHIAZIDE 20-25 MG PO TABS: 1.0000 | ORAL_TABLET | Freq: Every day | ORAL | Status: DC

## 2010-11-23 MED ORDER — ALPRAZOLAM 0.5 MG PO TABS: 0.5000 mg | ORAL_TABLET | Freq: Three times a day (TID) | ORAL | Status: DC | PRN

## 2010-11-23 NOTE — Progress Notes (Signed)
Addended by: Arnette Norris on: 11/23/2010 10:50 AM   Modules accepted: Orders

## 2010-11-23 NOTE — Progress Notes (Signed)
Addended by: Legrand Como on: 11/23/2010 09:44 AM   Modules accepted: Orders

## 2010-11-23 NOTE — Patient Instructions (Signed)

## 2010-11-23 NOTE — Progress Notes (Signed)
Subjective:     Anna Jackson is a 50 y.o. female and is here for a comprehensive physical exam. The patient reports no problems.  History   Social History  . Marital Status: Married    Spouse Name: N/A    Number of Children: 1  . Years of Education: N/A   Occupational History  . RECEIVING CLERK    Social History Main Topics  . Smoking status: Never Smoker   . Smokeless tobacco: Never Used  . Alcohol Use: Yes     occ varity  . Drug Use: No  . Sexually Active: Not on file   Other Topics Concern  . Not on file   Social History Narrative  . No narrative on file   Health Maintenance  Topic Date Due  . Pap Smear  11/21/2010  . Mammogram  12/10/2010  . Tetanus/tdap  05/10/2011  . Influenza Vaccine  11/16/2011    The following portions of the patient's history were reviewed and updated as appropriate: allergies, current medications, past family history, past medical history, past social history, past surgical history and problem list.  Review of Systems Review of Systems  Constitutional: Negative for activity change, appetite change and fatigue.  HENT: Negative for hearing loss, congestion, tinnitus and ear discharge.  dentist--NO Eyes: Negative for visual disturbance (see optho q1y -- vision corrected to 20/20 with glasses).  Respiratory: Negative for cough, chest tightness and shortness of breath.   Cardiovascular: Negative for chest pain, palpitations and leg swelling.  Gastrointestinal: Negative for abdominal pain, diarrhea, constipation and abdominal distention.  Genitourinary: Negative for urgency, frequency, decreased urine volume and difficulty urinating.  Musculoskeletal: Negative for back pain, arthralgias and gait problem.  Skin: Negative for color change, pallor and rash.  Neurological: Negative for dizziness, light-headedness, numbness and headaches.  Hematological: Negative for adenopathy. Does not bruise/bleed easily.  Psychiatric/Behavioral: Negative  for suicidal ideas, confusion, sleep disturbance, self-injury, dysphoric mood, decreased concentration and agitation.       Objective:    BP 124/86  Pulse 77  Temp(Src) 97.9 F (36.6 C) (Oral)  Ht 5' 7.5" (1.715 m)  Wt 236 lb 3.2 oz (107.14 kg)  BMI 36.45 kg/m2  SpO2 97% General appearance: alert, cooperative, appears stated age, no distress and moderately obese Head: Normocephalic, without obvious abnormality, atraumatic Eyes: conjunctivae/corneas clear. PERRL, EOM's intact. Fundi benign. Ears: normal TM's and external ear canals both ears Nose: Nares normal. Septum midline. Mucosa normal. No drainage or sinus tenderness. Throat: lips, mucosa, and tongue normal; teeth and gums normal Neck: no adenopathy, no carotid bruit, no JVD, supple, symmetrical, trachea midline and thyroid not enlarged, symmetric, no tenderness/mass/nodules Back: symmetric, no curvature. ROM normal. No CVA tenderness. Lungs: clear to auscultation bilaterally Breasts: normal appearance, no masses or tenderness Heart: regular rate and rhythm, S1, S2 normal, no murmur, click, rub or gallop Abdomen: soft, non-tender; bowel sounds normal; no masses,  no organomegaly Pelvic: not indicated; status post hysterectomy, negative ROS Extremities: extremities normal, atraumatic, no cyanosis or edema Pulses: 2+ and symmetric Skin: Skin color, texture, turgor normal. No rashes or lesions Lymph nodes: Cervical, supraclavicular, and axillary nodes normal. Neurologic: Alert and oriented X 3, normal strength and tone. Normal symmetric reflexes. Normal coordination and gait psych-- + anxiety secondary to husband being in hospice    Assessment:    Healthy female exam.  Anxiety/ depression---  Refill meds  htn--  Refill meds, stable    Plan:  Check labs mammo / bmd to be done  ghm utd   See After Visit Summary for Counseling Recommendations

## 2010-11-26 ENCOUNTER — Telehealth: Payer: Self-pay

## 2010-11-26 MED ORDER — POTASSIUM CHLORIDE CRYS ER 20 MEQ PO TBCR: 20.0000 meq | EXTENDED_RELEASE_TABLET | Freq: Two times a day (BID) | ORAL | Status: DC

## 2010-11-26 NOTE — Telephone Encounter (Signed)
Discussed with patient and she voiced understanding. Rx called to pharmacy and repeat labs scheduled for 12/10/10     KP

## 2010-11-26 NOTE — Telephone Encounter (Addendum)
Message copied by Arnette Norris on Thu Nov 26, 2010  9:02 AM ------      Message from: Lelon Perla      Created: Wed Nov 25, 2010  8:24 AM         k is low----if pt is supplementing K with diet--- Banana or OJ etc-----we need to add KCl 20 meq 1 po qd #30 1 po qd ---recheck 2 weeks 276.8 Bmp Cholesterol is great but alk phos is elevated-----repeat in 2 weeks---790.4 Hep, fractionated alk phos Neg culture

## 2010-12-09 ENCOUNTER — Other Ambulatory Visit: Payer: Self-pay | Admitting: Family Medicine

## 2010-12-10 ENCOUNTER — Other Ambulatory Visit (INDEPENDENT_AMBULATORY_CARE_PROVIDER_SITE_OTHER): Payer: PRIVATE HEALTH INSURANCE

## 2010-12-10 ENCOUNTER — Other Ambulatory Visit: Payer: Self-pay | Admitting: Family Medicine

## 2010-12-10 DIAGNOSIS — E876 Hypokalemia: Secondary | ICD-10-CM

## 2010-12-10 LAB — BASIC METABOLIC PANEL
BUN: 15 mg/dL (ref 6–23)
CO2: 28 mEq/L (ref 19–32)
Chloride: 101 mEq/L (ref 96–112)
Glucose, Bld: 84 mg/dL (ref 70–99)
Potassium: 3.6 mEq/L (ref 3.5–5.1)

## 2010-12-10 LAB — HEPATIC FUNCTION PANEL
ALT: 24 U/L (ref 0–35)
AST: 17 U/L (ref 0–37)
Albumin: 3.9 g/dL (ref 3.5–5.2)
Total Bilirubin: 0.7 mg/dL (ref 0.3–1.2)
Total Protein: 7.3 g/dL (ref 6.0–8.3)

## 2010-12-10 NOTE — Progress Notes (Signed)
Labs only

## 2010-12-11 LAB — ALKALINE PHOSPHATASE, ISOENZYMES
ALP, Heat Stable (Liver): 59 U/L
Alk Phos Bone Fract: 55 U/L
Alk Phos Liver Fract: 52 %
Alk Phos: 114 U/L (ref 39–117)

## 2010-12-14 ENCOUNTER — Other Ambulatory Visit: Payer: PRIVATE HEALTH INSURANCE

## 2010-12-14 ENCOUNTER — Ambulatory Visit: Payer: PRIVATE HEALTH INSURANCE

## 2010-12-16 ENCOUNTER — Other Ambulatory Visit: Payer: Self-pay | Admitting: Family Medicine

## 2010-12-17 ENCOUNTER — Ambulatory Visit
Admission: RE | Admit: 2010-12-17 | Discharge: 2010-12-17 | Disposition: A | Discharge status: Home / Self Care | Payer: PRIVATE HEALTH INSURANCE | Source: Ambulatory Visit | Attending: Family Medicine | Admitting: Family Medicine

## 2010-12-17 DIAGNOSIS — Z780 Asymptomatic menopausal state: Secondary | ICD-10-CM

## 2010-12-17 DIAGNOSIS — Z1231 Encounter for screening mammogram for malignant neoplasm of breast: Secondary | ICD-10-CM

## 2010-12-17 NOTE — Telephone Encounter (Signed)
Pharmacy rcv'd RX that was sent on the 0/8/12 and [place] the pharmacist advised me to disregard this Rx.      KP

## 2011-01-06 ENCOUNTER — Other Ambulatory Visit: Payer: Self-pay | Admitting: Family Medicine

## 2011-01-06 DIAGNOSIS — F419 Anxiety disorder, unspecified: Secondary | ICD-10-CM

## 2011-01-06 NOTE — Telephone Encounter (Signed)
Last seen and filled10/8/12.. Dr.Lowne patient. Please advise    KP

## 2011-01-08 MED ORDER — ALPRAZOLAM 0.5 MG PO TABS: 0.5000 mg | ORAL_TABLET | Freq: Three times a day (TID) | ORAL | Status: DC | PRN

## 2011-01-08 NOTE — Telephone Encounter (Signed)
OK X1 if not already called in

## 2011-01-08 NOTE — Telephone Encounter (Signed)
Manually faxed rx to pharmacy

## 2011-02-08 ENCOUNTER — Encounter: Payer: Self-pay | Admitting: Family Medicine

## 2011-02-22 ENCOUNTER — Other Ambulatory Visit: Payer: Self-pay | Admitting: Family Medicine

## 2011-02-22 DIAGNOSIS — F419 Anxiety disorder, unspecified: Secondary | ICD-10-CM

## 2011-02-22 MED ORDER — ALPRAZOLAM 0.5 MG PO TABS: 0.5000 mg | ORAL_TABLET | Freq: Three times a day (TID) | ORAL | Status: DC | PRN

## 2011-02-22 NOTE — Telephone Encounter (Signed)
Last seen 11/23/10 and filled 01/06/11 # 60. Please advise    KP

## 2011-02-23 MED ORDER — ALPRAZOLAM 0.5 MG PO TABS: 0.5000 mg | ORAL_TABLET | Freq: Three times a day (TID) | ORAL | Status: DC | PRN

## 2011-02-23 NOTE — Telephone Encounter (Signed)
Faxed.   KP 

## 2011-02-23 NOTE — Telephone Encounter (Signed)
Addended by: Arnette Norris on: 02/23/2011 08:38 AM   Modules accepted: Orders

## 2011-04-17 ENCOUNTER — Other Ambulatory Visit: Payer: Self-pay | Admitting: Family Medicine

## 2011-04-19 ENCOUNTER — Telehealth: Payer: Self-pay | Admitting: Family Medicine

## 2011-04-19 DIAGNOSIS — F419 Anxiety disorder, unspecified: Secondary | ICD-10-CM

## 2011-04-19 NOTE — Telephone Encounter (Signed)
Refill: Alprazolam .5mg  tablet. Take 1 tablet by mouth 3x daily as needed for sleep or anxiety.

## 2011-04-19 NOTE — Telephone Encounter (Signed)
Last seen 11/23/10 and filled 02/23/11 # 60. Please advise    KP

## 2011-04-19 NOTE — Telephone Encounter (Signed)
Ok to refill 

## 2011-04-20 MED ORDER — ALPRAZOLAM 0.5 MG PO TABS: 0.5000 mg | ORAL_TABLET | Freq: Three times a day (TID) | ORAL | Status: DC | PRN

## 2011-06-03 ENCOUNTER — Telehealth: Payer: Self-pay | Admitting: Family Medicine

## 2011-06-03 DIAGNOSIS — F419 Anxiety disorder, unspecified: Secondary | ICD-10-CM

## 2011-06-03 NOTE — Telephone Encounter (Signed)
OK X1 

## 2011-06-03 NOTE — Telephone Encounter (Signed)
Last OV 11-23-10, last filled 04-20-11 #60

## 2011-06-03 NOTE — Telephone Encounter (Signed)
Refill: alprazolam 0.5mg  tablet. Take 1 tablet by mouth 3 times daily as needed for sleep or anxiety

## 2011-06-04 MED ORDER — ALPRAZOLAM 0.5 MG PO TABS: 0.5000 mg | ORAL_TABLET | Freq: Three times a day (TID) | ORAL | Status: DC | PRN

## 2011-06-04 NOTE — Telephone Encounter (Signed)
RX sent

## 2011-06-17 ENCOUNTER — Other Ambulatory Visit: Payer: Self-pay | Admitting: Family Medicine

## 2011-06-18 ENCOUNTER — Encounter: Payer: Self-pay | Admitting: Family Medicine

## 2011-06-18 ENCOUNTER — Ambulatory Visit (INDEPENDENT_AMBULATORY_CARE_PROVIDER_SITE_OTHER): Payer: PRIVATE HEALTH INSURANCE | Admitting: Family Medicine

## 2011-06-18 VITALS — BP 126/74 | HR 77 | Temp 98.1°F | Wt 235.2 lb

## 2011-06-18 DIAGNOSIS — R300 Dysuria: Secondary | ICD-10-CM

## 2011-06-18 LAB — POCT URINALYSIS DIPSTICK
Bilirubin, UA: NEGATIVE
Glucose, UA: NEGATIVE
Ketones, UA: NEGATIVE
Nitrite, UA: NEGATIVE

## 2011-06-18 MED ORDER — CIPROFLOXACIN HCL 500 MG PO TABS: 500.0000 mg | ORAL_TABLET | Freq: Two times a day (BID) | ORAL | Status: AC

## 2011-06-18 NOTE — Patient Instructions (Signed)

## 2011-06-18 NOTE — Progress Notes (Signed)
  Subjective:    Anna Jackson is a 51 y.o. female who complains of frequency and urgency. She has had symptoms for 1 week. Patient also complains of none. Patient denies back pain, congestion, cough, fever, headache, rhinitis, sorethroat, stomach ache and vaginal discharge. Patient does not have a history of recurrent UTI. Patient does not have a history of pyelonephritis.   The following portions of the patient's history were reviewed and updated as appropriate: allergies, current medications, past family history, past medical history, past social history, past surgical history and problem list.  Review of Systems Pertinent items are noted in HPI.    Objective:    BP 126/74  Pulse 77  Temp(Src) 98.1 F (36.7 C) (Oral)  Wt 235 lb 3.2 oz (106.686 kg)  SpO2 99% General appearance: alert, cooperative, appears stated age and no distress Abdomen: soft, non-tender; bowel sounds normal; no masses,  no organomegaly  Laboratory:  Urine dipstick: large for hemoglobin and moderate for leukocyte esterase.   Micro exam: not done.    Assessment:    urinary frequency  ---check culture   Plan:    Medications: ciprofloxacin. Maintain adequate hydration.

## 2011-07-15 ENCOUNTER — Telehealth: Payer: Self-pay | Admitting: Family Medicine

## 2011-07-15 DIAGNOSIS — F419 Anxiety disorder, unspecified: Secondary | ICD-10-CM

## 2011-07-15 MED ORDER — ALPRAZOLAM 0.5 MG PO TABS: 0.5000 mg | ORAL_TABLET | Freq: Three times a day (TID) | ORAL | Status: DC | PRN

## 2011-07-15 NOTE — Telephone Encounter (Signed)
Last seen 06/18/11 and filled 06/04/11 # 60. Please advise     KP

## 2011-07-15 NOTE — Telephone Encounter (Signed)
Refill: Alprazolam 0.5mg  tablet. Take 1 tablet by mouth 3 times daily as needed for sleep or anxiety.

## 2011-07-15 NOTE — Telephone Encounter (Signed)
Refill x1 

## 2011-07-21 ENCOUNTER — Other Ambulatory Visit: Payer: Self-pay | Admitting: Family Medicine

## 2011-07-21 DIAGNOSIS — K219 Gastro-esophageal reflux disease without esophagitis: Secondary | ICD-10-CM

## 2011-07-21 DIAGNOSIS — K2270 Barrett's esophagus without dysplasia: Secondary | ICD-10-CM

## 2011-07-21 MED ORDER — OMEPRAZOLE MAGNESIUM 20 MG PO TBEC: 20.0000 mg | DELAYED_RELEASE_TABLET | Freq: Every day | ORAL | Status: DC

## 2011-07-21 NOTE — Telephone Encounter (Signed)
refill Prilosec otc 20mg  tablet Qty 28 Take one tablet by mouth once a day  Last fill 4.30.13 Last ov 5.3.13

## 2011-09-27 ENCOUNTER — Other Ambulatory Visit: Payer: Self-pay | Admitting: Family Medicine

## 2011-09-27 DIAGNOSIS — F419 Anxiety disorder, unspecified: Secondary | ICD-10-CM

## 2011-09-27 MED ORDER — ALPRAZOLAM 0.5 MG PO TABS: 0.5000 mg | ORAL_TABLET | Freq: Three times a day (TID) | ORAL | Status: DC | PRN

## 2011-09-27 NOTE — Telephone Encounter (Signed)
Refill x1   2 refills 

## 2011-09-27 NOTE — Telephone Encounter (Signed)
Please advise      KP 

## 2011-09-27 NOTE — Telephone Encounter (Signed)
Refill Alprazolam 0.5mg  Tablet, take 1-tablet by mouth 3-times daily as needed for sleep Last fill 5.30.13  LOV 5.3.13 Acute  Last qty 60

## 2011-09-30 ENCOUNTER — Other Ambulatory Visit: Payer: Self-pay | Admitting: Family Medicine

## 2011-10-04 ENCOUNTER — Encounter (HOSPITAL_COMMUNITY): Payer: Self-pay | Admitting: Emergency Medicine

## 2011-10-04 DIAGNOSIS — I10 Essential (primary) hypertension: Secondary | ICD-10-CM | POA: Insufficient documentation

## 2011-10-04 DIAGNOSIS — K219 Gastro-esophageal reflux disease without esophagitis: Secondary | ICD-10-CM | POA: Insufficient documentation

## 2011-10-04 DIAGNOSIS — D649 Anemia, unspecified: Secondary | ICD-10-CM | POA: Insufficient documentation

## 2011-10-04 NOTE — ED Notes (Signed)
PT. REPORTS HER SON COMMITED SUICIDE / DIED THIS EVENING , STATES ELEVATED BLOOD PRESSURE THIS EVENING = 200+/100+ , ALSO REPORTS HEADACHE AND SLIGHT CHEST DISCOMFORT.

## 2011-10-05 ENCOUNTER — Emergency Department (HOSPITAL_COMMUNITY)
Admission: EM | Admit: 2011-10-05 | Discharge: 2011-10-05 | Disposition: A | Discharge status: Home / Self Care | Payer: PRIVATE HEALTH INSURANCE | Attending: Emergency Medicine | Admitting: Emergency Medicine

## 2011-10-05 DIAGNOSIS — I10 Essential (primary) hypertension: Secondary | ICD-10-CM

## 2011-10-05 DIAGNOSIS — R51 Headache: Secondary | ICD-10-CM

## 2011-10-05 LAB — BASIC METABOLIC PANEL
BUN: 13 mg/dL (ref 6–23)
Calcium: 10.5 mg/dL (ref 8.4–10.5)
Creatinine, Ser: 0.91 mg/dL (ref 0.50–1.10)
GFR calc non Af Amer: 73 mL/min — ABNORMAL LOW (ref >90)
Glucose, Bld: 109 mg/dL — ABNORMAL HIGH (ref 70–99)

## 2011-10-05 LAB — CBC WITH DIFFERENTIAL/PLATELET
Eosinophils Absolute: 0.1 10*3/uL (ref 0.0–0.7)
Eosinophils Relative: 1 % (ref 0–5)
HCT: 38.7 % (ref 36.0–46.0)
Hemoglobin: 12.9 g/dL (ref 12.0–15.0)
Lymphs Abs: 2.1 10*3/uL (ref 0.7–4.0)
MCH: 28.2 pg (ref 26.0–34.0)
MCV: 84.7 fL (ref 78.0–100.0)
Monocytes Absolute: 0.8 10*3/uL (ref 0.1–1.0)
Monocytes Relative: 7 % (ref 3–12)
Platelets: 315 10*3/uL (ref 150–400)
RBC: 4.57 MIL/uL (ref 3.87–5.11)

## 2011-10-05 MED ORDER — METOCLOPRAMIDE HCL 5 MG PO TABS
5.0000 mg | ORAL_TABLET | Freq: Once | ORAL | Status: AC
  Administered 2011-10-05: 5 mg via ORAL
  Filled 2011-10-05: qty 1

## 2011-10-05 MED ORDER — IBUPROFEN 400 MG PO TABS
600.0000 mg | ORAL_TABLET | Freq: Once | ORAL | Status: AC
  Administered 2011-10-05: 600 mg via ORAL
  Filled 2011-10-05: qty 2

## 2011-10-05 MED ORDER — IBUPROFEN 600 MG PO TABS: 600.0000 mg | ORAL_TABLET | Freq: Four times a day (QID) | ORAL | Status: AC | PRN

## 2011-10-05 NOTE — ED Notes (Signed)
CHAPLAIN PAGED BY SECRETARY FOR EMOTIONAL SUPPORT.

## 2011-10-05 NOTE — Progress Notes (Signed)
I was called to ED by staff who said pt's son had committed suicide earlier that evening. Pt was w/boyfriend and was tearful. Her boyfriend was very attentive and supportive. Pt's first question and statement to me was, is it true in that in the Bible people who commit suicide will go to hell. I told her I had never read that and I shared with her Roman's 10:9-10. She said her son had confessed Jesus as his Savior. Pt said her son had experienced the loss of his father in December and his aunt (pt's sister), to whom he was very closed, passed in April. Since the passing of these two relatives pt said her son had been more withdrawn than usual. Over the last three months she said he had been very argumentative and was usually angry. She said tonight he got the gun and went outside and she said she heard the gun shot and she called police. Pt said she was thinking if he is shooting guns, this would be a good time to get help. Pt said when police arrived they told her her son had shot himself and was dead. Pt kept asking what more she could have done. She said she thought he may have had a mental illness but never got help.  She said she offered him help and once he saw a counselor at hospice but never went back. She said she tried to communicate with him but lately he was always angry. Pt was tearful as she asked what more she could have done. I told her I felt she had done all she could, and that she had been a great mother. I rehearsed some of what she told me about getting help for him and providing a home for him.  We talked about her belief that he had mental illness. We discussed how a mental illness is just like other illnesses and sometimes it too ends in death. She said she had never considered it in that way. Patient was waiting to be seen. She said she was having chest pains and a bad headache.   Marjory Lies, Chaplain October 05, 2011

## 2011-10-05 NOTE — ED Provider Notes (Signed)
History     CSN: [id number]  Arrival date & time 10/04/11  2343   First MD Initiated Contact with Patient 10/05/11 [phone]      Chief Complaint  Patient presents with  . Hypertension    (Consider location/radiation/quality/duration/timing/severity/associated sxs/prior treatment) HPI Comments: Pt w/ hx of HTn comes in with cc of elevated BP. Pt reportedly had a death in the family at her residence, and when EMS arrived her BP was 200s/100s and she was advised to come to the ED. Pt is having a moderate headache right now, but her pressures are well under the HTN urgency threshold. The pain is bilateral frontal, throbbing, and there is no associated n/v/visual complains/seziures/ams associated with them.Pt thinks she had am ild chest discomfort initially, but has been chest pain free throughout ED stay. No SOB. Pt has no hx of CAD. No illicit use.  Patient is a 51 y.o. female presenting with hypertension. The history is provided by the patient.  Hypertension Associated symptoms include headaches. Pertinent negatives include no chest pain, no abdominal pain and no shortness of breath.    Past Medical History  Diagnosis Date  . Asthma   . Hypertension   . Anxiety   . Headache   . Personal history of diseases of blood and blood-forming organs   . Personal history of other diseases of digestive system   . Esophageal reflux   . Personal history of other diseases of digestive system   . Urinary tract infection, site not specified   . Asymptomatic postmenopausal status (age-related) (natural)   . Acute sinusitis, unspecified   . Routine general medical examination at a health care facility   . Anxiety state, unspecified   . Pain in joint, lower leg   . Sprain of thoracic region   . Herpes simplex without mention of complication   . Dermatophytosis of nail   . Family history of malignant neoplasm of genital organ, other   . Other postprocedural status   . Unspecified essential  hypertension   . Headache   . Unspecified asthma   . Barrett esophagus   . Anemia     Past Surgical History  Procedure Date  . Cholecystectomy     with hernia repair  . Abdominal hysterectomy 2006  . Nissen fundoplication   . Cervical conization w/bx   . Tubal ligation   . Knee surgery 02/2010    Right--arthroscopic    Family History  Problem Relation Age of Onset  . Coronary artery disease      with hernia repair  . Migraines    . Hypertension    . Breast cancer Maternal Grandmother   . Heart disease Mother   . Colon cancer Neg Hx     History  Substance Use Topics  . Smoking status: Never Smoker   . Smokeless tobacco: Never Used  . Alcohol Use: Yes     occ varity    OB History    Grav Para Term Preterm Abortions TAB SAB Ect Mult Living                  Review of Systems  Constitutional: Positive for activity change.  HENT: Negative for neck pain.   Respiratory: Negative for chest tightness and shortness of breath.   Cardiovascular: Negative for chest pain and palpitations.  Gastrointestinal: Negative for nausea, vomiting and abdominal pain.  Genitourinary: Negative for dysuria.  Neurological: Positive for headaches. Negative for tremors, syncope, weakness and numbness.    Allergies  Codeine  Home Medications   Current Outpatient Rx  Name Route Sig Dispense Refill  . ALPRAZOLAM 0.5 MG PO TABS Oral Take 1 tablet (0.5 mg total) by mouth 3 (three) times daily as needed for sleep or anxiety. 60 tablet 2  . BUPROPION HCL ER (XL) 150 MG PO TB24  1 po qd 90 tablet 3  . FLUOXETINE HCL 40 MG PO CAPS  TAKE 1 CAPSULE BY MOUTH DAILY 30 capsule 4  . LISINOPRIL-HYDROCHLOROTHIAZIDE 20-25 MG PO TABS Oral Take 1 tablet by mouth daily. 90 tablet 3  . OMEPRAZOLE MAGNESIUM 20 MG PO TBEC Oral Take 1 tablet (20 mg total) by mouth daily. 30 tablet 4    BP 129/77  Pulse 70  Temp 97.9 F (36.6 C) (Oral)  Resp 17  SpO2 96%  Physical Exam  Constitutional: She is  oriented to person, place, and time. She appears well-developed and well-nourished.  HENT:  Head: Normocephalic and atraumatic.  Eyes: EOM are normal. Pupils are equal, round, and reactive to light.  Neck: Neck supple. No JVD present.  Cardiovascular: Normal rate, regular rhythm and normal heart sounds.   No murmur heard. Pulmonary/Chest: Effort normal. No respiratory distress.  Abdominal: Soft. She exhibits no distension. There is no tenderness. There is no rebound and no guarding.  Neurological: She is alert and oriented to person, place, and time.  Skin: Skin is warm and dry.    ED Course  Procedures (including critical care time)  Labs Reviewed  CBC WITH DIFFERENTIAL - Abnormal; Notable for the following:    WBC 11.1 (*)     Neutro Abs 8.2 (*)     All other components within normal limits  BASIC METABOLIC PANEL - Abnormal; Notable for the following:    Glucose, Bld 109 (*)     GFR calc non Af Amer 73 (*)     GFR calc Af Amer 85 (*)     All other components within normal limits  POCT I-STAT TROPONIN I   No results found.   No diagnosis found.    MDM   Date: 10/05/2011  Rate: 77  Rhythm: normal sinus rhythm  QRS Axis: normal  Intervals: normal  ST/T Wave abnormalities: nonspecific ST/T changes  Conduction Disutrbances:none  Narrative Interpretation:   Old EKG Reviewed: none available  Pt comes in with cc of elevated BP. Pt had death in the family at her residence, and was noted to have elevated BP. She has a moderate headache, no chest pain, sob at my evaluation, and her BP has been consistently below HTN urgency threshold - and was 129/84 when i was in the room. EKG is reassuring. Will discharge.  Likely the spike at home was from the stress of the death in the family.       Derwood Kaplan, MD 10/05/11 [phone]

## 2011-11-10 ENCOUNTER — Other Ambulatory Visit: Payer: Self-pay | Admitting: Internal Medicine

## 2011-11-10 ENCOUNTER — Telehealth: Payer: Self-pay | Admitting: Family Medicine

## 2011-11-10 NOTE — Telephone Encounter (Signed)
Msg left advising patient I would be mailing information to her home and to call back if she had any further questions or concerns.    KP

## 2011-11-10 NOTE — Telephone Encounter (Signed)
We can give her the list---- psych want pt to call

## 2011-11-10 NOTE — Telephone Encounter (Signed)
Please advise who you suggest.    KP

## 2011-11-10 NOTE — Telephone Encounter (Signed)
wants referral for psychiatrist or any type council- cb# [phone]

## 2011-11-10 NOTE — Telephone Encounter (Signed)
Rx sent.    MW 

## 2011-11-16 ENCOUNTER — Ambulatory Visit (INDEPENDENT_AMBULATORY_CARE_PROVIDER_SITE_OTHER): Payer: PRIVATE HEALTH INSURANCE | Admitting: Family Medicine

## 2011-11-16 ENCOUNTER — Encounter: Payer: Self-pay | Admitting: Family Medicine

## 2011-11-16 VITALS — BP 122/80 | HR 74 | Temp 98.1°F | Wt 236.4 lb

## 2011-11-16 DIAGNOSIS — G4700 Insomnia, unspecified: Secondary | ICD-10-CM

## 2011-11-16 DIAGNOSIS — F3289 Other specified depressive episodes: Secondary | ICD-10-CM

## 2011-11-16 DIAGNOSIS — F438 Other reactions to severe stress: Secondary | ICD-10-CM

## 2011-11-16 DIAGNOSIS — H5789 Other specified disorders of eye and adnexa: Secondary | ICD-10-CM

## 2011-11-16 DIAGNOSIS — F329 Major depressive disorder, single episode, unspecified: Secondary | ICD-10-CM

## 2011-11-16 DIAGNOSIS — F430 Acute stress reaction: Secondary | ICD-10-CM

## 2011-11-16 NOTE — Progress Notes (Signed)
  Subjective:    Patient ID: Anna Jackson, female    DOB: 1960/03/29, 51 y.o.   MRN: [id number]  HPI Pt here c/o waking up with a red eye this am.   No trauma, no coughing  , sneezing etc.  She does feel pressure below , behind that eye.  Pt has also been crying a lot --she lost her husband ,  Sister and son in the last 9 months.  She is going to go to the counselor.  Overall she is hanging in there and doing ok.     Review of Systems    as above Objective:   Physical Exam  Nursing note and vitals reviewed. Constitutional: She is oriented to person, place, and time. She appears well-developed and well-nourished.  Eyes:       R eye---sclera blood shot No other abnormality L eye-- normal  Neurological: She is alert and oriented to person, place, and time.  Psychiatric: She has a normal mood and affect. Her behavior is normal. Judgment and thought content normal.          Assessment & Plan:

## 2011-11-16 NOTE — Assessment & Plan Note (Signed)
con't meds Make appointment with psych

## 2011-11-16 NOTE — Assessment & Plan Note (Signed)
With pressure Maybe from sneezing or coughing in her sleep or the fact she has been crying but will have her opht see her---appointment is tomorrow

## 2011-11-16 NOTE — Patient Instructions (Addendum)

## 2011-12-06 ENCOUNTER — Other Ambulatory Visit: Payer: Self-pay | Admitting: Family Medicine

## 2011-12-06 DIAGNOSIS — Z1231 Encounter for screening mammogram for malignant neoplasm of breast: Secondary | ICD-10-CM

## 2011-12-08 ENCOUNTER — Other Ambulatory Visit: Payer: Self-pay

## 2011-12-08 DIAGNOSIS — I10 Essential (primary) hypertension: Secondary | ICD-10-CM

## 2011-12-08 DIAGNOSIS — F419 Anxiety disorder, unspecified: Secondary | ICD-10-CM

## 2011-12-08 MED ORDER — BUPROPION HCL ER (XL) 150 MG PO TB24: ORAL_TABLET | ORAL | Status: DC

## 2011-12-08 MED ORDER — LISINOPRIL-HYDROCHLOROTHIAZIDE 20-25 MG PO TABS: 1.0000 | ORAL_TABLET | Freq: Every day | ORAL | Status: DC

## 2011-12-08 NOTE — Telephone Encounter (Signed)
[facility] called and left message on triage line during lunch. They wanted to call in concerning 2 Rx for pt. I sent 2 Rx to pharmacy. Wellbutrin, Lisinopril       MW

## 2011-12-20 ENCOUNTER — Encounter: Payer: Self-pay | Admitting: Family Medicine

## 2011-12-20 ENCOUNTER — Ambulatory Visit (INDEPENDENT_AMBULATORY_CARE_PROVIDER_SITE_OTHER): Payer: PRIVATE HEALTH INSURANCE | Admitting: Family Medicine

## 2011-12-20 ENCOUNTER — Ambulatory Visit: Payer: PRIVATE HEALTH INSURANCE

## 2011-12-20 VITALS — BP 116/78 | HR 70 | Temp 98.3°F | Wt 237.0 lb

## 2011-12-20 DIAGNOSIS — R609 Edema, unspecified: Secondary | ICD-10-CM

## 2011-12-20 DIAGNOSIS — I10 Essential (primary) hypertension: Secondary | ICD-10-CM

## 2011-12-20 DIAGNOSIS — F329 Major depressive disorder, single episode, unspecified: Secondary | ICD-10-CM

## 2011-12-20 MED ORDER — ASENAPINE MALEATE 5 MG SL SUBL: SUBLINGUAL_TABLET | SUBLINGUAL | Status: DC

## 2011-12-20 MED ORDER — FUROSEMIDE 20 MG PO TABS: 20.0000 mg | ORAL_TABLET | Freq: Every day | ORAL | Status: DC

## 2011-12-20 MED ORDER — LISINOPRIL 20 MG PO TABS: 20.0000 mg | ORAL_TABLET | Freq: Every day | ORAL | Status: DC

## 2011-12-20 NOTE — Progress Notes (Signed)
  Subjective:    Anna Jackson is a 51 y.o. female who presents for evaluation of edema in both lower legs. The edema has been mild. Onset of symptoms was several weeks ago, and patient reports symptoms have gradually worsened since that time. The edema is present in the evening. The patient states the problem is long-standing. The swelling has been aggravated by dependency of involved area. The swelling has been relieved by diuretics, elevation of involved area, low-salt diet. Associated factors include: nothing. Cardiac risk factors: hypertension, obesity (BMI >= 30 kg/m2) and sedentary lifestyle.  The following portions of the patient's history were reviewed and updated as appropriate: allergies, current medications, past family history, past medical history, past social history, past surgical history and problem list.  Review of Systems Pertinent items are noted in HPI.   Objective:    BP 116/78  Pulse 70  Temp 98.3 F (36.8 C) (Oral)  Wt 237 lb (107.502 kg)  SpO2 99% General appearance: alert, cooperative, appears stated age and no distress Lungs: clear to auscultation bilaterally Heart: regular rate and rhythm, S1, S2 normal, no murmur, click, rub or gallop Extremities: extremities normal, atraumatic, no cyanosis or edema   Cardiographics ECG: not done  Imaging Chest x-ray: not indicated   Assessment:     Edema .    Plan:    Recommendations: decrease sodium in the diet, elevate feet above the level of the heart whenever possible and use of compression stockings. The patient was also instructed to call IMMEDIATELY (i.e., day or night) if any cardiopulmonary symptoms occur, especially chest pain, shortness of breath, dyspnea on exertion, paroxysmal nocturnal dyspnea, or orthopnea, and these were explained. Follow up in 2 weeks and as needed.  D/c hct and start lasix

## 2011-12-20 NOTE — Patient Instructions (Addendum)
Edema Edema is an abnormal build-up of fluids in tissues. Because this is partly dependent on gravity (water flows to the lowest place), it is more common in the legs and thighs (lower extremities). It is also common in the looser tissues, like around the eyes. Painless swelling of the feet and ankles is common and increases as a person ages. It may affect both legs and may include the calves or even thighs. When squeezed, the fluid may move out of the affected area and may leave a dent for a few moments. CAUSES   Prolonged standing or sitting in one place for extended periods of time. Movement helps pump tissue fluid into the veins, and absence of movement prevents this, resulting in edema.  Varicose veins. The valves in the veins do not work as well as they should. This causes fluid to leak into the tissues.  Fluid and salt overload.  Injury, burn, or surgery to the leg, ankle, or foot, may damage veins and allow fluid to leak out.  Sunburn damages vessels. Leaky vessels allow fluid to go out into the sunburned tissues.  Allergies (from insect bites or stings, medications or chemicals) cause swelling by allowing vessels to become leaky.  Protein in the blood helps keep fluid in your vessels. Low protein, as in malnutrition, allows fluid to leak out.  Hormonal changes, including pregnancy and menstruation, cause fluid retention. This fluid may leak out of vessels and cause edema.  Medications that cause fluid retention. Examples are sex hormones, blood pressure medications, steroid treatment, or anti-depressants.  Some illnesses cause edema, especially heart failure, kidney disease, or liver disease.  Surgery that cuts veins or lymph nodes, such as surgery done for the heart or for breast cancer, may result in edema. DIAGNOSIS  Your caregiver is usually easily able to determine what is causing your swelling (edema) by simply asking what is wrong (getting a history) and examining you (doing  a physical). Sometimes x-rays, EKG (electrocardiogram or heart tracing), and blood work may be done to evaluate for underlying medical illness. TREATMENT  General treatment includes:  Leg elevation (or elevation of the affected body part).  Restriction of fluid intake.  Prevention of fluid overload.  Compression of the affected body part. Compression with elastic bandages or support stockings squeezes the tissues, preventing fluid from entering and forcing it back into the blood vessels.  Diuretics (also called water pills or fluid pills) pull fluid out of your body in the form of increased urination. These are effective in reducing the swelling, but can have side effects and must be used only under your caregiver's supervision. Diuretics are appropriate only for some types of edema. The specific treatment can be directed at any underlying causes discovered. Heart, liver, or kidney disease should be treated appropriately. HOME CARE INSTRUCTIONS   Elevate the legs (or affected body part) above the level of the heart, while lying down.  Avoid sitting or standing still for prolonged periods of time.  Avoid putting anything directly under the knees when lying down, and do not wear constricting clothing or garters on the upper legs.  Exercising the legs causes the fluid to work back into the veins and lymphatic channels. This may help the swelling go down.  The pressure applied by elastic bandages or support stockings can help reduce ankle swelling.  A low-salt diet may help reduce fluid retention and decrease the ankle swelling.  Take any medications exactly as prescribed. SEEK MEDICAL CARE IF:  Your edema is   not responding to recommended treatments. SEEK IMMEDIATE MEDICAL CARE IF:   You develop shortness of breath or chest pain.  You cannot breathe when you lay down; or if, while lying down, you have to get up and go to the window to get your breath.  You are having increasing  swelling without relief from treatment.  You develop a fever over 102 F (38.9 C).  You develop pain or redness in the areas that are swollen.  Tell your caregiver right away if you have gained 3 lb/1.4 kg in 1 day or 5 lb/2.3 kg in a week. MAKE SURE YOU:   Understand these instructions.  Will watch your condition.  Will get help right away if you are not doing well or get worse. Document Released: 02/01/2005 Document Revised: 08/03/2011 Document Reviewed: 09/20/2007 [employer] Patient Information 2013 [employer].  

## 2011-12-23 ENCOUNTER — Ambulatory Visit: Payer: PRIVATE HEALTH INSURANCE

## 2012-02-28 ENCOUNTER — Ambulatory Visit
Admission: RE | Admit: 2012-02-28 | Discharge: 2012-02-28 | Disposition: A | Discharge status: Home / Self Care | Payer: PRIVATE HEALTH INSURANCE | Source: Ambulatory Visit | Attending: Family Medicine | Admitting: Family Medicine

## 2012-02-28 DIAGNOSIS — Z1231 Encounter for screening mammogram for malignant neoplasm of breast: Secondary | ICD-10-CM

## 2012-03-06 ENCOUNTER — Other Ambulatory Visit: Payer: Self-pay | Admitting: Family Medicine

## 2012-03-06 DIAGNOSIS — K219 Gastro-esophageal reflux disease without esophagitis: Secondary | ICD-10-CM

## 2012-03-06 DIAGNOSIS — K2270 Barrett's esophagus without dysplasia: Secondary | ICD-10-CM

## 2012-03-06 MED ORDER — OMEPRAZOLE MAGNESIUM 20 MG PO TBEC: 20.0000 mg | DELAYED_RELEASE_TABLET | Freq: Every day | ORAL | Status: DC

## 2012-03-06 NOTE — Telephone Encounter (Signed)
Refill PRILOSEC OTC 20 MG Take 1 tablet (20 mg total) by mouth daily. #30 wt 4-refills last fill 12.19.13

## 2012-03-14 ENCOUNTER — Encounter: Payer: Self-pay | Admitting: Lab

## 2012-03-15 ENCOUNTER — Encounter: Payer: PRIVATE HEALTH INSURANCE | Admitting: Family Medicine

## 2012-03-24 ENCOUNTER — Encounter: Payer: Self-pay | Admitting: Lab

## 2012-03-27 ENCOUNTER — Other Ambulatory Visit (HOSPITAL_COMMUNITY)
Admission: RE | Admit: 2012-03-27 | Discharge: 2012-03-27 | Disposition: A | Discharge status: Home / Self Care | Payer: PRIVATE HEALTH INSURANCE | Source: Ambulatory Visit | Attending: Family Medicine | Admitting: Family Medicine

## 2012-03-27 ENCOUNTER — Ambulatory Visit (INDEPENDENT_AMBULATORY_CARE_PROVIDER_SITE_OTHER): Payer: PRIVATE HEALTH INSURANCE | Admitting: Family Medicine

## 2012-03-27 ENCOUNTER — Encounter: Payer: Self-pay | Admitting: Family Medicine

## 2012-03-27 VITALS — BP 118/74 | HR 71 | Temp 97.9°F | Ht 66.75 in | Wt 236.4 lb

## 2012-03-27 DIAGNOSIS — B3749 Other urogenital candidiasis: Secondary | ICD-10-CM

## 2012-03-27 DIAGNOSIS — Z0000 Encounter for general adult medical examination without abnormal findings: Secondary | ICD-10-CM

## 2012-03-27 DIAGNOSIS — Z01419 Encounter for gynecological examination (general) (routine) without abnormal findings: Secondary | ICD-10-CM | POA: Insufficient documentation

## 2012-03-27 DIAGNOSIS — I10 Essential (primary) hypertension: Secondary | ICD-10-CM

## 2012-03-27 NOTE — Addendum Note (Signed)
Addended by: Arnette Norris on: 03/27/2012 06:01 PM   Modules accepted: Orders

## 2012-03-27 NOTE — Progress Notes (Signed)
Subjective:     Anna Jackson is a 52 y.o. female and is here for a comprehensive physical exam. The patient reports no problems.  History   Social History  . Marital Status: Widowed    Spouse Name: N/A    Number of Children: 1  . Years of Education: N/A   Occupational History  . RECEIVING CLERK    Social History Main Topics  . Smoking status: Never Smoker   . Smokeless tobacco: Never Used  . Alcohol Use: Yes     Comment: occ varity  . Drug Use: No  . Sexually Active: Not Currently -- Female partner(s)   Other Topics Concern  . Not on file   Social History Narrative   Exercise--  treadmill   Health Maintenance  Topic Date Due  . Influenza Vaccine  10/16/2012  . Mammogram  02/27/2013  . Pap Smear  03/28/2015  . Tetanus/tdap  11/22/2020    The following portions of the patient's history were reviewed and updated as appropriate:  She  has a past medical history of Asthma; Hypertension; Anxiety; Headache; Personal history of diseases of blood and blood-forming organs; Personal history of other diseases of digestive system; Esophageal reflux; Personal history of other diseases of digestive system; Urinary tract infection, site not specified; Asymptomatic postmenopausal status (age-related) (natural); Acute sinusitis, unspecified; Routine general medical examination at a health care facility; Anxiety state, unspecified; Pain in joint, lower leg; Sprain of thoracic region; Herpes simplex without mention of complication; Dermatophytosis of nail; Family history of malignant neoplasm of genital organ, other; Other postprocedural status; Unspecified essential hypertension; Headache; Unspecified asthma; Barrett esophagus; and Anemia. She  does not have any pertinent problems on file. She  has past surgical history that includes Cholecystectomy; Abdominal hysterectomy (2006); Nissen fundoplication; Cervical conization w/bx; Tubal ligation; and Knee surgery (02/2010). Her family  history includes Breast cancer in her maternal grandmother; Coronary artery disease in an unspecified family member; Heart disease in her mother; Hypertension in an unspecified family member; and Migraines in an unspecified family member.  There is no history of Colon cancer. She  reports that she has never smoked. She has never used smokeless tobacco. She reports that  drinks alcohol. She reports that she does not use illicit drugs. She has a current medication list which includes the following prescription(s): alprazolam, asenapine, bupropion, fluoxetine, furosemide, lisinopril, and omeprazole, and the following Facility-Administered Medications: sodium chloride. Current Outpatient Prescriptions on File Prior to Visit  Medication Sig Dispense Refill  . ALPRAZolam (XANAX) 0.5 MG tablet TAKE 1 TABLET BY MOUTH 3 TIMES DAILY AS NEEDED FOR SLEEP OR ANXIETY  60 tablet  0  . asenapine (SAPHRIS) 5 MG SUBL 1 po qhs  30 tablet  5  . buPROPion (WELLBUTRIN XL) 150 MG 24 hr tablet 1 po qd  90 tablet  3  . FLUoxetine (PROZAC) 40 MG capsule TAKE 1 CAPSULE BY MOUTH DAILY  30 capsule  4  . furosemide (LASIX) 20 MG tablet Take 1 tablet (20 mg total) by mouth daily.  30 tablet  3  . lisinopril (PRINIVIL,ZESTRIL) 20 MG tablet Take 1 tablet (20 mg total) by mouth daily.  90 tablet  3  . omeprazole (PRILOSEC OTC) 20 MG tablet Take 1 tablet (20 mg total) by mouth daily.  30 tablet  4   Current Facility-Administered Medications on File Prior to Visit  Medication Dose Route Frequency Provider Last Rate Last Dose  . 0.9 %  sodium chloride infusion  500 mL  Intravenous Continuous Louis Meckel, MD       She is allergic to codeine..  Review of Systems Review of Systems  Constitutional: Negative for activity change, appetite change and fatigue.  HENT: Negative for hearing loss, congestion, tinnitus and ear discharge.  dentist q13m Eyes: Negative for visual disturbance (see optho q1y -- vision corrected to 20/20 with  glasses).  Respiratory: Negative for cough, chest tightness and shortness of breath.   Cardiovascular: Negative for chest pain, palpitations and leg swelling.  Gastrointestinal: Negative for abdominal pain, diarrhea, constipation and abdominal distention.  Genitourinary: Negative for urgency, frequency, decreased urine volume and difficulty urinating.  Musculoskeletal: Negative for back pain, arthralgias and gait problem.  Skin: Negative for color change, pallor and rash.  Neurological: Negative for dizziness, light-headedness, numbness and headaches.  Hematological: Negative for adenopathy. Does not bruise/bleed easily.  Psychiatric/Behavioral: Negative for suicidal ideas, confusion, sleep disturbance, self-injury, dysphoric mood, decreased concentration and agitation.       Objective:    BP 118/74  Pulse 71  Temp(Src) 97.9 F (36.6 C) (Oral)  Ht 5' 6.75" (1.695 m)  Wt 236 lb 6.4 oz (107.23 kg)  BMI 37.32 kg/m2  SpO2 97% General appearance: alert, cooperative, appears stated age and no distress Head: Normocephalic, without obvious abnormality, atraumatic Eyes: conjunctivae/corneas clear. PERRL, EOM's intact. Fundi benign. Ears: normal TM's and external ear canals both ears Nose: Nares normal. Septum midline. Mucosa normal. No drainage or sinus tenderness. Throat: lips, mucosa, and tongue normal; teeth and gums normal Neck: no adenopathy, no carotid bruit, no JVD, supple, symmetrical, trachea midline and thyroid not enlarged, symmetric, no tenderness/mass/nodules Back: symmetric, no curvature. ROM normal. No CVA tenderness. Lungs: clear to auscultation bilaterally Breasts: normal appearance, no masses or tenderness Heart: regular rate and rhythm, S1, S2 normal, no murmur, click, rub or gallop Abdomen: soft, non-tender; bowel sounds normal; no masses,  no organomegaly Pelvic: external genitalia normal, no adnexal masses or tenderness, rectovaginal septum normal, uterus normal  size, shape, and consistency, uterus surgically absent and vagina normal without discharge Extremities: extremities normal, atraumatic, no cyanosis or edema Pulses: 2+ and symmetric Skin: Skin color, texture, turgor normal. No rashes or lesions Lymph nodes: Cervical, supraclavicular, and axillary nodes normal. Neurologic: Alert and oriented X 3, normal strength and tone. Normal symmetric reflexes. Normal coordination and gait Psych-- no depression/ anxiety---doing well since death of son, husband sister----in counseling      Assessment:    Healthy female exam.      Plan:     ghm utd Check labs See After Visit Summary for Counseling Recommendations

## 2012-03-27 NOTE — Patient Instructions (Addendum)
Preventive Care for Adults, Female A healthy lifestyle and preventive care can promote health and wellness. Preventive health guidelines for women include the following key practices.  A routine yearly physical is a good way to check with your caregiver about your health and preventive screening. It is a chance to share any concerns and updates on your health, and to receive a thorough exam.  Visit your dentist for a routine exam and preventive care every 6 months. Brush your teeth twice a day and floss once a day. Good oral hygiene prevents tooth decay and gum disease.  The frequency of eye exams is based on your age, health, family medical history, use of contact lenses, and other factors. Follow your caregiver's recommendations for frequency of eye exams.  Eat a healthy diet. Foods like vegetables, fruits, whole grains, low-fat dairy products, and lean protein foods contain the nutrients you need without too many calories. Decrease your intake of foods high in solid fats, added sugars, and salt. Eat the right amount of calories for you.Get information about a proper diet from your caregiver, if necessary.  Regular physical exercise is one of the most important things you can do for your health. Most adults should get at least 150 minutes of moderate-intensity exercise (any activity that increases your heart rate and causes you to sweat) each week. In addition, most adults need muscle-strengthening exercises on 2 or more days a week.  Maintain a healthy weight. The body mass index (BMI) is a screening tool to identify possible weight problems. It provides an estimate of body fat based on height and weight. Your caregiver can help determine your BMI, and can help you achieve or maintain a healthy weight.For adults 20 years and older:  A BMI below 18.5 is considered underweight.  A BMI of 18.5 to 24.9 is normal.  A BMI of 25 to 29.9 is considered overweight.  A BMI of 30 and above is  considered obese.  Maintain normal blood lipids and cholesterol levels by exercising and minimizing your intake of saturated fat. Eat a balanced diet with plenty of fruit and vegetables. Blood tests for lipids and cholesterol should begin at age 20 and be repeated every 5 years. If your lipid or cholesterol levels are high, you are over 50, or you are at high risk for heart disease, you may need your cholesterol levels checked more frequently.Ongoing high lipid and cholesterol levels should be treated with medicines if diet and exercise are not effective.  If you smoke, find out from your caregiver how to quit. If you do not use tobacco, do not start.  If you are pregnant, do not drink alcohol. If you are breastfeeding, be very cautious about drinking alcohol. If you are not pregnant and choose to drink alcohol, do not exceed 1 drink per day. One drink is considered to be 12 ounces (355 mL) of beer, 5 ounces (148 mL) of wine, or 1.5 ounces (44 mL) of liquor.  Avoid use of street drugs. Do not share needles with anyone. Ask for help if you need support or instructions about stopping the use of drugs.  High blood pressure causes heart disease and increases the risk of stroke. Your blood pressure should be checked at least every 1 to 2 years. Ongoing high blood pressure should be treated with medicines if weight loss and exercise are not effective.  If you are 55 to 50 years old, ask your caregiver if you should take aspirin to prevent strokes.  Diabetes   screening involves taking a blood sample to check your fasting blood sugar level. This should be done once every 3 years, after age 45, if you are within normal weight and without risk factors for diabetes. Testing should be considered at a younger age or be carried out more frequently if you are overweight and have at least 1 risk factor for diabetes.  Breast cancer screening is essential preventive care for women. You should practice "breast  self-awareness." This means understanding the normal appearance and feel of your breasts and may include breast self-examination. Any changes detected, no matter how small, should be reported to a caregiver. Women in their 20s and 30s should have a clinical breast exam (CBE) by a caregiver as part of a regular health exam every 1 to 3 years. After age 40, women should have a CBE every year. Starting at age 40, women should consider having a mammography (breast X-ray test) every year. Women who have a family history of breast cancer should talk to their caregiver about genetic screening. Women at a high risk of breast cancer should talk to their caregivers about having magnetic resonance imaging (MRI) and a mammography every year.  The Pap test is a screening test for cervical cancer. A Pap test can show cell changes on the cervix that might become cervical cancer if left untreated. A Pap test is a procedure in which cells are obtained and examined from the lower end of the uterus (cervix).  Women should have a Pap test starting at age 21.  Between ages 21 and 29, Pap tests should be repeated every 2 years.  Beginning at age 30, you should have a Pap test every 3 years as long as the past 3 Pap tests have been normal.  Some women have medical problems that increase the chance of getting cervical cancer. Talk to your caregiver about these problems. It is especially important to talk to your caregiver if a new problem develops soon after your last Pap test. In these cases, your caregiver may recommend more frequent screening and Pap tests.  The above recommendations are the same for women who have or have not gotten the vaccine for human papillomavirus (HPV).  If you had a hysterectomy for a problem that was not cancer or a condition that could lead to cancer, then you no longer need Pap tests. Even if you no longer need a Pap test, a regular exam is a good idea to make sure no other problems are  starting.  If you are between ages 65 and 70, and you have had normal Pap tests going back 10 years, you no longer need Pap tests. Even if you no longer need a Pap test, a regular exam is a good idea to make sure no other problems are starting.  If you have had past treatment for cervical cancer or a condition that could lead to cancer, you need Pap tests and screening for cancer for at least 20 years after your treatment.  If Pap tests have been discontinued, risk factors (such as a new sexual partner) need to be reassessed to determine if screening should be resumed.  The HPV test is an additional test that may be used for cervical cancer screening. The HPV test looks for the virus that can cause the cell changes on the cervix. The cells collected during the Pap test can be tested for HPV. The HPV test could be used to screen women aged 30 years and older, and should   be used in women of any age who have unclear Pap test results. After the age of 30, women should have HPV testing at the same frequency as a Pap test.  Colorectal cancer can be detected and often prevented. Most routine colorectal cancer screening begins at the age of 50 and continues through age 75. However, your caregiver may recommend screening at an earlier age if you have risk factors for colon cancer. On a yearly basis, your caregiver may provide home test kits to check for hidden blood in the stool. Use of a small camera at the end of a tube, to directly examine the colon (sigmoidoscopy or colonoscopy), can detect the earliest forms of colorectal cancer. Talk to your caregiver about this at age 50, when routine screening begins. Direct examination of the colon should be repeated every 5 to 10 years through age 75, unless early forms of pre-cancerous polyps or small growths are found.  Hepatitis C blood testing is recommended for all people born from 1945 through 1965 and any individual with known risks for hepatitis C.  Practice  safe sex. Use condoms and avoid high-risk sexual practices to reduce the spread of sexually transmitted infections (STIs). STIs include gonorrhea, chlamydia, syphilis, trichomonas, herpes, HPV, and human immunodeficiency virus (HIV). Herpes, HIV, and HPV are viral illnesses that have no cure. They can result in disability, cancer, and death. Sexually active women aged 25 and younger should be checked for chlamydia. Older women with new or multiple partners should also be tested for chlamydia. Testing for other STIs is recommended if you are sexually active and at increased risk.  Osteoporosis is a disease in which the bones lose minerals and strength with aging. This can result in serious bone fractures. The risk of osteoporosis can be identified using a bone density scan. Women ages 65 and over and women at risk for fractures or osteoporosis should discuss screening with their caregivers. Ask your caregiver whether you should take a calcium supplement or vitamin D to reduce the rate of osteoporosis.  Menopause can be associated with physical symptoms and risks. Hormone replacement therapy is available to decrease symptoms and risks. You should talk to your caregiver about whether hormone replacement therapy is right for you.  Use sunscreen with sun protection factor (SPF) of 30 or more. Apply sunscreen liberally and repeatedly throughout the day. You should seek shade when your shadow is shorter than you. Protect yourself by wearing long sleeves, pants, a wide-brimmed hat, and sunglasses year round, whenever you are outdoors.  Once a month, do a whole body skin exam, using a mirror to look at the skin on your back. Notify your caregiver of new moles, moles that have irregular borders, moles that are larger than a pencil eraser, or moles that have changed in shape or color.  Stay current with required immunizations.  Influenza. You need a dose every fall (or winter). The composition of the flu vaccine  changes each year, so being vaccinated once is not enough.  Pneumococcal polysaccharide. You need 1 to 2 doses if you smoke cigarettes or if you have certain chronic medical conditions. You need 1 dose at age 65 (or older) if you have never been vaccinated.  Tetanus, diphtheria, pertussis (Tdap, Td). Get 1 dose of Tdap vaccine if you are younger than age 65, are over 65 and have contact with an infant, are a healthcare worker, are pregnant, or simply want to be protected from whooping cough. After that, you need a Td   booster dose every 10 years. Consult your caregiver if you have not had at least 3 tetanus and diphtheria-containing shots sometime in your life or have a deep or dirty wound.  HPV. You need this vaccine if you are a woman age 26 or younger. The vaccine is given in 3 doses over 6 months.  Measles, mumps, rubella (MMR). You need at least 1 dose of MMR if you were born in 1957 or later. You may also need a second dose.  Meningococcal. If you are age 19 to 21 and a first-year college student living in a residence hall, or have one of several medical conditions, you need to get vaccinated against meningococcal disease. You may also need additional booster doses.  Zoster (shingles). If you are age 60 or older, you should get this vaccine.  Varicella (chickenpox). If you have never had chickenpox or you were vaccinated but received only 1 dose, talk to your caregiver to find out if you need this vaccine.  Hepatitis A. You need this vaccine if you have a specific risk factor for hepatitis A virus infection or you simply wish to be protected from this disease. The vaccine is usually given as 2 doses, 6 to 18 months apart.  Hepatitis B. You need this vaccine if you have a specific risk factor for hepatitis B virus infection or you simply wish to be protected from this disease. The vaccine is given in 3 doses, usually over 6 months. Preventive Services / Frequency Ages 19 to 39  Blood  pressure check.** / Every 1 to 2 years.  Lipid and cholesterol check.** / Every 5 years beginning at age 20.  Clinical breast exam.** / Every 3 years for women in their 20s and 30s.  Pap test.** / Every 2 years from ages 21 through 29. Every 3 years starting at age 30 through age 65 or 70 with a history of 3 consecutive normal Pap tests.  HPV screening.** / Every 3 years from ages 30 through ages 65 to 70 with a history of 3 consecutive normal Pap tests.  Hepatitis C blood test.** / For any individual with known risks for hepatitis C.  Skin self-exam. / Monthly.  Influenza immunization.** / Every year.  Pneumococcal polysaccharide immunization.** / 1 to 2 doses if you smoke cigarettes or if you have certain chronic medical conditions.  Tetanus, diphtheria, pertussis (Tdap, Td) immunization. / A one-time dose of Tdap vaccine. After that, you need a Td booster dose every 10 years.  HPV immunization. / 3 doses over 6 months, if you are 26 and younger.  Measles, mumps, rubella (MMR) immunization. / You need at least 1 dose of MMR if you were born in 1957 or later. You may also need a second dose.  Meningococcal immunization. / 1 dose if you are age 19 to 21 and a first-year college student living in a residence hall, or have one of several medical conditions, you need to get vaccinated against meningococcal disease. You may also need additional booster doses.  Varicella immunization.** / Consult your caregiver.  Hepatitis A immunization.** / Consult your caregiver. 2 doses, 6 to 18 months apart.  Hepatitis B immunization.** / Consult your caregiver. 3 doses usually over 6 months. Ages 40 to 64  Blood pressure check.** / Every 1 to 2 years.  Lipid and cholesterol check.** / Every 5 years beginning at age 20.  Clinical breast exam.** / Every year after age 40.  Mammogram.** / Every year beginning at age 40   and continuing for as long as you are in good health. Consult with your  caregiver.  Pap test.** / Every 3 years starting at age 30 through age 65 or 70 with a history of 3 consecutive normal Pap tests.  HPV screening.** / Every 3 years from ages 30 through ages 65 to 70 with a history of 3 consecutive normal Pap tests.  Fecal occult blood test (FOBT) of stool. / Every year beginning at age 50 and continuing until age 75. You may not need to do this test if you get a colonoscopy every 10 years.  Flexible sigmoidoscopy or colonoscopy.** / Every 5 years for a flexible sigmoidoscopy or every 10 years for a colonoscopy beginning at age 50 and continuing until age 75.  Hepatitis C blood test.** / For all people born from 1945 through 1965 and any individual with known risks for hepatitis C.  Skin self-exam. / Monthly.  Influenza immunization.** / Every year.  Pneumococcal polysaccharide immunization.** / 1 to 2 doses if you smoke cigarettes or if you have certain chronic medical conditions.  Tetanus, diphtheria, pertussis (Tdap, Td) immunization.** / A one-time dose of Tdap vaccine. After that, you need a Td booster dose every 10 years.  Measles, mumps, rubella (MMR) immunization. / You need at least 1 dose of MMR if you were born in 1957 or later. You may also need a second dose.  Varicella immunization.** / Consult your caregiver.  Meningococcal immunization.** / Consult your caregiver.  Hepatitis A immunization.** / Consult your caregiver. 2 doses, 6 to 18 months apart.  Hepatitis B immunization.** / Consult your caregiver. 3 doses, usually over 6 months. Ages 65 and over  Blood pressure check.** / Every 1 to 2 years.  Lipid and cholesterol check.** / Every 5 years beginning at age 20.  Clinical breast exam.** / Every year after age 40.  Mammogram.** / Every year beginning at age 40 and continuing for as long as you are in good health. Consult with your caregiver.  Pap test.** / Every 3 years starting at age 30 through age 65 or 70 with a 3  consecutive normal Pap tests. Testing can be stopped between 65 and 70 with 3 consecutive normal Pap tests and no abnormal Pap or HPV tests in the past 10 years.  HPV screening.** / Every 3 years from ages 30 through ages 65 or 70 with a history of 3 consecutive normal Pap tests. Testing can be stopped between 65 and 70 with 3 consecutive normal Pap tests and no abnormal Pap or HPV tests in the past 10 years.  Fecal occult blood test (FOBT) of stool. / Every year beginning at age 50 and continuing until age 75. You may not need to do this test if you get a colonoscopy every 10 years.  Flexible sigmoidoscopy or colonoscopy.** / Every 5 years for a flexible sigmoidoscopy or every 10 years for a colonoscopy beginning at age 50 and continuing until age 75.  Hepatitis C blood test.** / For all people born from 1945 through 1965 and any individual with known risks for hepatitis C.  Osteoporosis screening.** / A one-time screening for women ages 65 and over and women at risk for fractures or osteoporosis.  Skin self-exam. / Monthly.  Influenza immunization.** / Every year.  Pneumococcal polysaccharide immunization.** / 1 dose at age 65 (or older) if you have never been vaccinated.  Tetanus, diphtheria, pertussis (Tdap, Td) immunization. / A one-time dose of Tdap vaccine if you are over   65 and have contact with an infant, are a healthcare worker, or simply want to be protected from whooping cough. After that, you need a Td booster dose every 10 years.  Varicella immunization.** / Consult your caregiver.  Meningococcal immunization.** / Consult your caregiver.  Hepatitis A immunization.** / Consult your caregiver. 2 doses, 6 to 18 months apart.  Hepatitis B immunization.** / Check with your caregiver. 3 doses, usually over 6 months. ** Family history and personal history of risk and conditions may change your caregiver's recommendations. Document Released: 03/30/2001 Document Revised: 04/26/2011  Document Reviewed: 06/29/2010 [employer] Patient Information 2013 [employer].  

## 2012-03-27 NOTE — Assessment & Plan Note (Signed)
Refill meds Check labs

## 2012-03-28 LAB — LIPID PANEL
HDL: 66.4 mg/dL (ref >39.00)
Triglycerides: 77 mg/dL (ref 0.0–149.0)
VLDL: 15.4 mg/dL (ref 0.0–40.0)

## 2012-03-28 LAB — CBC WITH DIFFERENTIAL/PLATELET
Basophils Absolute: 0 10*3/uL (ref 0.0–0.1)
Eosinophils Absolute: 0.1 10*3/uL (ref 0.0–0.7)
Lymphocytes Relative: 29.3 % (ref 12.0–46.0)
MCHC: 33.5 g/dL (ref 30.0–36.0)
MCV: 84.5 fl (ref 78.0–100.0)
Monocytes Absolute: 0.4 10*3/uL (ref 0.1–1.0)
Neutro Abs: 4.1 10*3/uL (ref 1.4–7.7)
Neutrophils Relative %: 63.3 % (ref 43.0–77.0)
RDW: 13.9 % (ref 11.5–14.6)

## 2012-03-28 LAB — POCT URINALYSIS DIPSTICK
Bilirubin, UA: NEGATIVE
Blood, UA: NEGATIVE
Ketones, UA: NEGATIVE
Leukocytes, UA: NEGATIVE
Protein, UA: NEGATIVE
Spec Grav, UA: <=1.005
pH, UA: 7

## 2012-03-28 LAB — BASIC METABOLIC PANEL
CO2: 27 mEq/L (ref 19–32)
Calcium: 9.2 mg/dL (ref 8.4–10.5)
Chloride: 101 mEq/L (ref 96–112)
Creatinine, Ser: 0.8 mg/dL (ref 0.4–1.2)
Glucose, Bld: 81 mg/dL (ref 70–99)

## 2012-03-28 LAB — HEPATIC FUNCTION PANEL
Albumin: 3.9 g/dL (ref 3.5–5.2)
Alkaline Phosphatase: 145 U/L — ABNORMAL HIGH (ref 39–117)
Bilirubin, Direct: 0.1 mg/dL (ref 0.0–0.3)
Total Protein: 7 g/dL (ref 6.0–8.3)

## 2012-03-28 NOTE — Progress Notes (Signed)
Accidentally ordered a urine culture pt did not need one.Anna Jackson

## 2012-03-28 NOTE — Addendum Note (Signed)
Addended by: Silvio Pate D on: 03/28/2012 02:25 PM   Modules accepted: Orders

## 2012-04-04 ENCOUNTER — Encounter: Payer: Self-pay | Admitting: *Deleted

## 2012-04-05 ENCOUNTER — Telehealth: Payer: Self-pay | Admitting: *Deleted

## 2012-04-05 NOTE — Telephone Encounter (Signed)
Called pt, to discuss lab results.  lmovm to return call.

## 2012-04-21 ENCOUNTER — Other Ambulatory Visit: Payer: Self-pay | Admitting: Family Medicine

## 2012-05-08 ENCOUNTER — Other Ambulatory Visit: Payer: Self-pay | Admitting: Family Medicine

## 2012-06-09 ENCOUNTER — Other Ambulatory Visit: Payer: Self-pay | Admitting: Family Medicine

## 2012-06-23 ENCOUNTER — Encounter: Payer: Self-pay | Admitting: Family Medicine

## 2012-06-23 ENCOUNTER — Ambulatory Visit (INDEPENDENT_AMBULATORY_CARE_PROVIDER_SITE_OTHER): Payer: BC Managed Care – PPO | Admitting: Family Medicine

## 2012-06-23 VITALS — BP 100/72 | HR 69 | Temp 98.0°F | Ht 66.75 in | Wt 238.6 lb

## 2012-06-23 DIAGNOSIS — H109 Unspecified conjunctivitis: Secondary | ICD-10-CM

## 2012-06-23 MED ORDER — POLYMYXIN B-TRIMETHOPRIM 10000-0.1 UNIT/ML-% OP SOLN: OPHTHALMIC | Status: DC

## 2012-06-23 NOTE — Progress Notes (Signed)
  Subjective:    Patient ID: Denton Ar, female    DOB: 05-08-1960, 52 y.o.   MRN: [id number]  HPI L eye pain- sxs started last night w/ a gritty, itchy feeling, 'like there's something under the eyelid'.  Has attempted to flush eye w/out relief.  This AM was red and swollen and matted shut.  Drainage is thin and watery, not purulent.  No visual changes w/ exception of photosensitivity.   Review of Systems For ROS see HPI     Objective:   Physical Exam  Vitals reviewed. Constitutional: She appears well-developed and well-nourished. No distress.  HENT:  Head: Normocephalic and atraumatic.  Eyes: EOM are normal. Pupils are equal, round, and reactive to light. Right eye exhibits no discharge. Left eye exhibits no discharge.  L conjunctiva injected.  Fluorescein exam shows scattered uptake w/out linear abrasion.          Assessment & Plan:

## 2012-06-23 NOTE — Patient Instructions (Addendum)
This appears to be a superficial scratch Start the polytrim drops as directed- use for 7 days If no improvement by Monday- please call us or your eye doctor Call with any questions or concerns Happy Mother's Day!

## 2012-06-25 NOTE — Assessment & Plan Note (Signed)
New.  Fluorescein exam consistent w/ diffuse irritation rather than linear abrasion.  Suspect pt had small particulate foreign body and rubbed her eyes.  Start abx drops.  Reviewed supportive care and red flags that should prompt return.  Pt expressed understanding and is in agreement w/ plan.

## 2012-07-31 ENCOUNTER — Encounter: Payer: Self-pay | Admitting: Family Medicine

## 2012-08-08 ENCOUNTER — Ambulatory Visit (INDEPENDENT_AMBULATORY_CARE_PROVIDER_SITE_OTHER): Payer: BC Managed Care – PPO | Admitting: Family Medicine

## 2012-08-08 ENCOUNTER — Encounter: Payer: Self-pay | Admitting: Family Medicine

## 2012-08-08 VITALS — BP 118/80 | HR 75 | Temp 98.1°F | Wt 238.8 lb

## 2012-08-08 DIAGNOSIS — E669 Obesity, unspecified: Secondary | ICD-10-CM

## 2012-08-08 DIAGNOSIS — I10 Essential (primary) hypertension: Secondary | ICD-10-CM

## 2012-08-08 MED ORDER — PHENTERMINE HCL 37.5 MG PO TABS: 37.5000 mg | ORAL_TABLET | Freq: Every day | ORAL | Status: DC

## 2012-08-08 NOTE — Progress Notes (Signed)
  Subjective:    Patient ID: Anna Jackson, female    DOB: Jan 15, 1961, 52 y.o.   MRN: [id number]  HPI Pt here to discuss losing weight.   She is part of a competition at work.  She is riding a bike for exercise and is watching her diet.  Pt is struggling to lose weight.     Review of Systems As above    Objective:   Physical Exam  BP 118/80  Pulse 75  Temp(Src) 98.1 F (36.7 C) (Oral)  Wt 238 lb 12.8 oz (108.319 kg)  BMI 37.7 kg/m2  SpO2 97% General appearance: alert, cooperative, appears stated age, no distress and morbidly obese Neck: no adenopathy, no carotid bruit, no JVD, supple, symmetrical, trachea midline and thyroid not enlarged, symmetric, no tenderness/mass/nodules Lungs: clear to auscultation bilaterally Heart: S1, S2 normal Abdomen: soft, non-tender; bowel sounds normal; no masses,  no organomegaly      Assessment & Plan:

## 2012-08-08 NOTE — Patient Instructions (Signed)
Obesity Obesity is defined as having too much total body fat and a body mass index (BMI) of 30 or more. BMI is an estimate of body fat and is calculated from your height and weight. Obesity happens when you consume more calories than you can burn by exercising or performing daily physical tasks. Prolonged obesity can cause major illnesses or emergencies, such as:   A stroke.  Heart disease.  Diabetes.  Cancer.  Arthritis.  High blood pressure (hypertension).  High cholesterol.  Sleep apnea.  Erectile dysfunction.  Infertility problems. CAUSES   Regularly eating unhealthy foods.  Physical inactivity.  Certain disorders, such as an underactive thyroid (hypothyroidism), Cushing's syndrome, and polycystic ovarian syndrome.  Certain medicines, such as steroids, some depression medicines, and antipsychotics.  Genetics.  Lack of sleep. DIAGNOSIS  A caregiver can diagnose obesity after calculating your BMI. Obesity will be diagnosed if your BMI is 30 or higher.  There are other methods of measuring obesity levels. Some other methods include measuring your skin fold thickness, your waist circumference, and comparing your hip circumference to your waist circumference. TREATMENT  A healthy treatment program includes some or all of the following:  Long-term dietary changes.  Exercise and physical activity.  Behavioral and lifestyle changes.  Medicine only under the supervision of your caregiver. Medicines may help, but only if they are used with diet and exercise programs. An unhealthy treatment program includes:  Fasting.  Fad diets.  Supplements and drugs. These choices do not succeed in long-term weight control.  HOME CARE INSTRUCTIONS   Exercise and perform physical activity as directed by your caregiver. To increase physical activity, try the following:  Use stairs instead of elevators.  Park farther away from store entrances.  Garden, bike, or walk instead of  watching television or using the computer.  Eat healthy, low-calorie foods and drinks on a regular basis. Eat more fruits and vegetables. Use low-calorie cookbooks or take healthy cooking classes.  Limit fast food, sweets, and processed snack foods.  Eat smaller portions.  Keep a daily journal of everything you eat. There are many free websites to help you with this. It may be helpful to measure your foods so you can determine if you are eating the correct portion sizes.  Avoid drinking alcohol. Drink more water and drinks without calories.  Take vitamins and supplements only as recommended by your caregiver.  Weight-loss support groups, Registered Dieticians, counselors, and stress reduction education can also be very helpful. SEEK IMMEDIATE MEDICAL CARE IF:  You have chest pain or tightness.  You have trouble breathing or feel short of breath.  You have weakness or leg numbness.  You feel confused or have trouble talking.  You have sudden changes in your vision. MAKE SURE YOU:  Understand these instructions.  Will watch your condition.  Will get help right away if you are not doing well or get worse. Document Released: 03/11/2004 Document Revised: 08/03/2011 Document Reviewed: 03/10/2011 [employer] Patient Information 2014 [employer].  

## 2012-08-08 NOTE — Assessment & Plan Note (Signed)
D/w pt diet and exercise adipex qd If she does not lose 4 lbs in 1 month it will not work

## 2012-09-10 ENCOUNTER — Other Ambulatory Visit: Payer: Self-pay | Admitting: Family Medicine

## 2012-09-10 DIAGNOSIS — E669 Obesity, unspecified: Secondary | ICD-10-CM

## 2012-09-11 ENCOUNTER — Telehealth: Payer: Self-pay | Admitting: *Deleted

## 2012-09-11 MED ORDER — PHENTERMINE HCL 37.5 MG PO TABS: 37.5000 mg | ORAL_TABLET | Freq: Every day | ORAL | Status: DC

## 2012-09-11 NOTE — Telephone Encounter (Deleted)
Refill for Adipex se

## 2012-09-11 NOTE — Telephone Encounter (Signed)
Patient requesting refill on Adipex-P. LAst office visit and date of last refill both on 08/08/12 for #30 with no rf. Okay to refill?

## 2012-09-11 NOTE — Telephone Encounter (Signed)
Per Dr. Laury Axon, if she has not loss   4 pounds by now we cannot refill it. Otherwise okay ok #30, no refills.

## 2012-09-12 ENCOUNTER — Other Ambulatory Visit: Payer: Self-pay | Admitting: *Deleted

## 2012-09-12 DIAGNOSIS — E669 Obesity, unspecified: Secondary | ICD-10-CM

## 2012-09-12 MED ORDER — PHENTERMINE HCL 37.5 MG PO TABS: 37.5000 mg | ORAL_TABLET | Freq: Every day | ORAL | Status: DC

## 2012-09-12 NOTE — Telephone Encounter (Signed)
Left message on voice mail to advised med can be refilled only if pt has lost at least 4#. Weight needs to be at 234.8 at the time pt picks up Rx, otherwise we can't refill.

## 2012-09-13 NOTE — Telephone Encounter (Signed)
LM on (09-12-12) asking the pt to RTC regarding med refill-see telephone encounter note on(09-12-12).//AB/CMA

## 2012-09-29 ENCOUNTER — Other Ambulatory Visit: Payer: Self-pay | Admitting: Family Medicine

## 2012-11-09 ENCOUNTER — Other Ambulatory Visit: Payer: Self-pay | Admitting: Family Medicine

## 2012-11-22 ENCOUNTER — Encounter: Payer: Self-pay | Admitting: Family Medicine

## 2013-01-02 ENCOUNTER — Other Ambulatory Visit: Payer: Self-pay | Admitting: Family Medicine

## 2013-01-08 ENCOUNTER — Other Ambulatory Visit: Payer: Self-pay | Admitting: Family Medicine

## 2013-01-25 ENCOUNTER — Ambulatory Visit: Payer: BC Managed Care – PPO | Admitting: Family Medicine

## 2013-01-27 ENCOUNTER — Other Ambulatory Visit: Payer: Self-pay | Admitting: Family Medicine

## 2013-02-01 ENCOUNTER — Ambulatory Visit (INDEPENDENT_AMBULATORY_CARE_PROVIDER_SITE_OTHER): Payer: BC Managed Care – PPO | Admitting: Family Medicine

## 2013-02-01 ENCOUNTER — Encounter: Payer: Self-pay | Admitting: Family Medicine

## 2013-02-01 VITALS — BP 118/80 | HR 74 | Temp 98.2°F | Wt 236.0 lb

## 2013-02-01 DIAGNOSIS — R2232 Localized swelling, mass and lump, left upper limb: Secondary | ICD-10-CM

## 2013-02-01 DIAGNOSIS — R229 Localized swelling, mass and lump, unspecified: Secondary | ICD-10-CM

## 2013-02-01 DIAGNOSIS — L0291 Cutaneous abscess, unspecified: Secondary | ICD-10-CM

## 2013-02-01 MED ORDER — CEPHALEXIN 500 MG PO CAPS: 500.0000 mg | ORAL_CAPSULE | Freq: Two times a day (BID) | ORAL | Status: DC

## 2013-02-01 NOTE — Addendum Note (Signed)
Addended by: Lelon Perla on: 02/01/2013 12:16 PM   Modules accepted: Orders

## 2013-02-01 NOTE — Patient Instructions (Signed)
Diagnostic mammogram is being scheduled with [facility]

## 2013-02-01 NOTE — Progress Notes (Signed)
Pre visit review using our clinic review tool, if applicable. No additional management support is needed unless otherwise documented below in the visit note. 

## 2013-02-01 NOTE — Progress Notes (Signed)
   Subjective:    Patient ID: Anna Jackson, female    DOB: 1961-01-18, 52 y.o.   MRN: [id number]  HPI  Pt here c/o mass in L axilla--- very tender to touch x 1 week.  No other complaints.  Review of Systems As above    Objective:   Physical Exam BP 118/80  Pulse 74  Temp(Src) 98.2 F (36.8 C) (Oral)  Wt 236 lb (107.049 kg)  SpO2 99% General appearance: alert, cooperative, appears stated age and no distress Breasts: positive findings: + L axillary mass, tender to touch, no errythema , no hot to touch       Assessment & Plan:

## 2013-02-01 NOTE — Assessment & Plan Note (Signed)
abx per orders Diagnostic mammogram

## 2013-02-07 ENCOUNTER — Other Ambulatory Visit: Payer: Self-pay | Admitting: Family Medicine

## 2013-02-07 ENCOUNTER — Ambulatory Visit
Admission: RE | Admit: 2013-02-07 | Discharge: 2013-02-07 | Disposition: A | Discharge status: Home / Self Care | Payer: BC Managed Care – PPO | Source: Ambulatory Visit | Attending: Family Medicine | Admitting: Family Medicine

## 2013-02-07 DIAGNOSIS — R2232 Localized swelling, mass and lump, left upper limb: Secondary | ICD-10-CM

## 2013-02-13 ENCOUNTER — Other Ambulatory Visit: Payer: Self-pay | Admitting: Family Medicine

## 2013-02-13 ENCOUNTER — Ambulatory Visit
Admission: RE | Admit: 2013-02-13 | Discharge: 2013-02-13 | Disposition: A | Discharge status: Home / Self Care | Payer: BC Managed Care – PPO | Source: Ambulatory Visit | Attending: Family Medicine | Admitting: Family Medicine

## 2013-02-13 ENCOUNTER — Other Ambulatory Visit: Payer: Self-pay | Admitting: Radiology

## 2013-02-13 DIAGNOSIS — R2232 Localized swelling, mass and lump, left upper limb: Secondary | ICD-10-CM

## 2013-02-13 HISTORY — PX: BREAST BIOPSY: SHX20

## 2013-02-15 DIAGNOSIS — C8590 Non-Hodgkin lymphoma, unspecified, unspecified site: Secondary | ICD-10-CM

## 2013-02-15 HISTORY — DX: Non-Hodgkin lymphoma, unspecified, unspecified site: C85.90

## 2013-02-23 ENCOUNTER — Ambulatory Visit (INDEPENDENT_AMBULATORY_CARE_PROVIDER_SITE_OTHER): Payer: BC Managed Care – PPO | Admitting: Family Medicine

## 2013-02-23 ENCOUNTER — Encounter: Payer: Self-pay | Admitting: Family Medicine

## 2013-02-23 VITALS — BP 108/78 | HR 68 | Temp 98.2°F | Wt 230.4 lb

## 2013-02-23 DIAGNOSIS — G8929 Other chronic pain: Secondary | ICD-10-CM

## 2013-02-23 DIAGNOSIS — R1013 Epigastric pain: Secondary | ICD-10-CM

## 2013-02-23 DIAGNOSIS — K219 Gastro-esophageal reflux disease without esophagitis: Secondary | ICD-10-CM

## 2013-02-23 DIAGNOSIS — R229 Localized swelling, mass and lump, unspecified: Secondary | ICD-10-CM

## 2013-02-23 DIAGNOSIS — R0789 Other chest pain: Secondary | ICD-10-CM

## 2013-02-23 DIAGNOSIS — R2232 Localized swelling, mass and lump, left upper limb: Secondary | ICD-10-CM

## 2013-02-23 LAB — AMYLASE: AMYLASE: 31 U/L (ref 27–131)

## 2013-02-23 LAB — CBC WITH DIFFERENTIAL/PLATELET
BASOS ABS: 0 10*3/uL (ref 0.0–0.1)
Basophils Relative: 0.6 % (ref 0.0–3.0)
EOS PCT: 0.8 % (ref 0.0–5.0)
Eosinophils Absolute: 0 10*3/uL (ref 0.0–0.7)
HEMATOCRIT: 36.5 % (ref 36.0–46.0)
Hemoglobin: 12.1 g/dL (ref 12.0–15.0)
LYMPHS ABS: 1.6 10*3/uL (ref 0.7–4.0)
Lymphocytes Relative: 27 % (ref 12.0–46.0)
MCHC: 33 g/dL (ref 30.0–36.0)
MCV: 83.5 fl (ref 78.0–100.0)
Monocytes Absolute: 0.4 10*3/uL (ref 0.1–1.0)
Monocytes Relative: 7.5 % (ref 3.0–12.0)
NEUTROS ABS: 3.8 10*3/uL (ref 1.4–7.7)
Neutrophils Relative %: 64.1 % (ref 43.0–77.0)
Platelets: 341 10*3/uL (ref 150.0–400.0)
RBC: 4.38 Mil/uL (ref 3.87–5.11)
RDW: 14.2 % (ref 11.5–14.6)
WBC: 6 10*3/uL (ref 4.5–10.5)

## 2013-02-23 LAB — HEPATIC FUNCTION PANEL
ALT: 31 U/L (ref 0–35)
AST: 38 U/L — ABNORMAL HIGH (ref 0–37)
Albumin: 3.9 g/dL (ref 3.5–5.2)
Alkaline Phosphatase: 130 U/L — ABNORMAL HIGH (ref 39–117)
Bilirubin, Direct: 0 mg/dL (ref 0.0–0.3)
TOTAL PROTEIN: 7.2 g/dL (ref 6.0–8.3)
Total Bilirubin: 0.8 mg/dL (ref 0.3–1.2)

## 2013-02-23 LAB — BASIC METABOLIC PANEL
BUN: 9 mg/dL (ref 6–23)
CALCIUM: 9.3 mg/dL (ref 8.4–10.5)
CO2: 28 mEq/L (ref 19–32)
CREATININE: 0.8 mg/dL (ref 0.4–1.2)
Chloride: 103 mEq/L (ref 96–112)
GFR: 81.79 mL/min (ref >60.00)
GLUCOSE: 83 mg/dL (ref 70–99)
Potassium: 3.6 mEq/L (ref 3.5–5.1)
Sodium: 139 mEq/L (ref 135–145)

## 2013-02-23 LAB — LIPASE: LIPASE: 31 U/L (ref 11.0–59.0)

## 2013-02-23 LAB — H. PYLORI ANTIBODY, IGG: H Pylori IgG: NEGATIVE

## 2013-02-23 MED ORDER — DEXLANSOPRAZOLE 60 MG PO CPDR: 60.0000 mg | DELAYED_RELEASE_CAPSULE | Freq: Every day | ORAL | Status: DC

## 2013-02-23 NOTE — Patient Instructions (Signed)

## 2013-02-23 NOTE — Assessment & Plan Note (Signed)
Surgery upcoming.

## 2013-02-23 NOTE — Assessment & Plan Note (Signed)
Hx barretts esophagus dexilant  Consider GI

## 2013-02-23 NOTE — Progress Notes (Signed)
Pre visit review using our clinic review tool, if applicable. No additional management support is needed unless otherwise documented below in the visit note. 

## 2013-02-23 NOTE — Progress Notes (Signed)
   Subjective:    Patient ID: Anna Jackson, female    DOB: Jun 23, 1960, 53 y.o.   MRN: [id number]  HPI Pt here to discuss upcoming surgery to remove mass L axilla.  She is also having L shoulder blade pain and gerd symptoms.  No chest pain,  + sob.   Review of Systems As above    Objective:   Physical Exam BP 108/78  Pulse 68  Temp(Src) 98.2 F (36.8 C) (Oral)  Wt 230 lb 6.4 oz (104.509 kg)  SpO2 98% General appearance: alert, cooperative, appears stated age and no distress Throat: lips, mucosa, and tongue normal; teeth and gums normal Neck: no adenopathy, no carotid bruit, no JVD, supple, symmetrical, trachea midline and thyroid not enlarged, symmetric, no tenderness/mass/nodules Lungs: clear to auscultation bilaterally Heart: regular rate and rhythm, S1, S2 normal, no murmur, click, rub or gallop Abdomen: soft, non-tender; bowel sounds normal; no masses,  no organomegaly       Assessment & Plan:

## 2013-03-02 ENCOUNTER — Ambulatory Visit (INDEPENDENT_AMBULATORY_CARE_PROVIDER_SITE_OTHER): Payer: BC Managed Care – PPO | Admitting: General Surgery

## 2013-03-02 ENCOUNTER — Encounter (INDEPENDENT_AMBULATORY_CARE_PROVIDER_SITE_OTHER): Payer: Self-pay | Admitting: General Surgery

## 2013-03-02 VITALS — BP 130/88 | HR 76 | Temp 97.6°F | Resp 16 | Ht 68.0 in | Wt 232.2 lb

## 2013-03-02 DIAGNOSIS — R590 Localized enlarged lymph nodes: Secondary | ICD-10-CM

## 2013-03-02 DIAGNOSIS — R599 Enlarged lymph nodes, unspecified: Secondary | ICD-10-CM

## 2013-03-02 NOTE — Patient Instructions (Signed)
The ultrasound and the biopsy of your left axillary lymph nodes is somewhat suspicious for a lymphoproliferative disorder. Lymphoma would be one possibility. It is also possible that this is a benign reactive lymph node.  You will be scheduled for a small operation under general anesthesia where we excise one or 2 of the abnormal lymph nodes in your left axilla.  This will be an outpatient surgery and you will go home the same day.  We will try to do this at [facility] next week.

## 2013-03-02 NOTE — Progress Notes (Signed)
Patient ID: Anna Jackson, female   DOB: 12/04/1960, 53 y.o.   MRN: [id number]  Chief Complaint  Patient presents with  . Breast Problem    new pt- eval lt br atypical lymph nodes    HPI Anna Jackson is a 53 y.o. female.  She is referred by Dr. Shelly Bombard at the breast center [facility] for evaluation and consideration of excisional biopsy of a left axillary lymph node. Dr. Garnet Koyanagi is her PCP.   The patient has no prior history of lymphoproliferative disorders, and she has no prior history of breast disease. About a month ago she felt a lump in her left axilla and it was a little bit sore. Mammograms were basically normal, but diagnostic left mammogram and ultrasound left breast showed multiple enlarged left axillary lymph nodes with abnormal cortical thickening, the largest said to be 3.4 cm and another adjacent 2.4 cm and several smaller but prominent nodes. There was a cyst in the left breast. Ultrasound-guided core biopsy without marker clip was evaluated by Dr. Gari Crown in pathology. This was read as atypical lymphoid proliferation. He did flow cytometry which showed T-cell with predominant helper phenotype but no monoclonal B-cell population identified. Excisional biopsy was strongly recommended. She is here with her husband today.  She denies any injury to the upper extremity.  No cats or dogs. This is minimally painful. No fever or chills. No night sweats.  Comorbidities include depression, well-controlled on Wellbutrin. Hypertension. GERD. Morbid obesity. Status post open hiatal hernia repair by Lennie Hummer. Status post umbilical hernia repair by Dr. Rush Farmer. Status post vaginal hysterectomy and left salpingo-oophorectomy.  Family history reveals breast cancer in maternal grandmother. Coronary artery disease and hypertension in mother and sister.No history of leukemia or lymphoma in family. HPI  Past Medical History  Diagnosis Date  . Asthma   . Hypertension   . Anxiety   .  Headache(784.0)   . Personal history of diseases of blood and blood-forming organs   . Personal history of other diseases of digestive system   . Esophageal reflux   . Personal history of other diseases of digestive system   . Urinary tract infection, site not specified   . Asymptomatic postmenopausal status (age-related) (natural)   . Acute sinusitis, unspecified   . Routine general medical examination at a health care facility   . Anxiety state, unspecified   . Pain in joint, lower leg   . Sprain of thoracic region   . Herpes simplex without mention of complication   . Dermatophytosis of nail   . Family history of malignant neoplasm of genital organ, other   . Other postprocedural status(V45.89)   . Unspecified essential hypertension   . Headache(784.0)   . Unspecified asthma(493.90)   . Barrett esophagus   . Anemia     Past Surgical History  Procedure Laterality Date  . Cholecystectomy      with hernia repair  . Abdominal hysterectomy  2006  . Nissen fundoplication    . Cervical conization w/bx    . Tubal ligation    . Knee surgery  02/2010    Right--arthroscopic    Family History  Problem Relation Age of Onset  . Coronary artery disease      with hernia repair  . Migraines    . Hypertension    . Breast cancer Maternal Grandmother   . Cancer Maternal Grandmother     breast  . Heart disease Mother   . Colon cancer Neg Hx  Social History History  Substance Use Topics  . Smoking status: Never Smoker   . Smokeless tobacco: Never Used  . Alcohol Use: Yes     Comment: occ varity    Allergies  Allergen Reactions  . Codeine     REACTION: SICK-NAUSEATED    Current Outpatient Prescriptions  Medication Sig Dispense Refill  . ALPRAZolam (XANAX) 0.5 MG tablet TAKE 1 TABLET BY MOUTH 3 TIMES DAILY AS NEEDED FOR SLEEP OR ANXIETY  60 tablet  0  . buPROPion (WELLBUTRIN XL) 150 MG 24 hr tablet TAKE 1 TABLET BY MOUTH DAILY  90 tablet  2  . FLUoxetine (PROZAC) 40 MG  capsule TAKE 1 CAPSULE BY MOUTH DAILY  30 capsule  4  . furosemide (LASIX) 20 MG tablet TAKE 1 TABLET (20 MG TOTAL) BY MOUTH DAILY.  30 tablet  3  . lisinopril (PRINIVIL,ZESTRIL) 20 MG tablet TAKE 1 TABLET (20 MG TOTAL) BY MOUTH DAILY.  90 tablet  0  . omeprazole (PRILOSEC) 10 MG capsule Take 10 mg by mouth daily.       No current facility-administered medications for this visit.    Review of Systems Review of Systems  Constitutional: Negative for fever, chills and unexpected weight change.  HENT: Negative for congestion, hearing loss, sore throat, trouble swallowing and voice change.   Eyes: Negative for visual disturbance.  Respiratory: Negative for cough and wheezing.   Cardiovascular: Negative for chest pain, palpitations and leg swelling.  Gastrointestinal: Negative for nausea, vomiting, abdominal pain, diarrhea, constipation, blood in stool, abdominal distention and anal bleeding.  Genitourinary: Negative for hematuria, vaginal bleeding and difficulty urinating.  Musculoskeletal: Negative for arthralgias.  Skin: Negative for rash and wound.  Neurological: Negative for seizures, syncope and headaches.  Hematological: Negative for adenopathy. Does not bruise/bleed easily.  Psychiatric/Behavioral: Negative for confusion.    Blood pressure 130/88, pulse 76, temperature 97.6 F (36.4 C), temperature source Temporal, resp. rate 16, height 5\' 8"  (1.727 m), weight 232 lb 3.2 oz (105.325 kg).  Physical Exam Physical Exam  Constitutional: She is oriented to person, place, and time. She appears well-developed and well-nourished. No distress.  Height 5 feet 8. Weight 232.  HENT:  Head: Normocephalic and atraumatic.  Nose: Nose normal.  Mouth/Throat: No oropharyngeal exudate.  Eyes: Conjunctivae and EOM are normal. Pupils are equal, round, and reactive to light. Left eye exhibits no discharge. No scleral icterus.  Neck: Neck supple. No JVD present. No tracheal deviation present. No  thyromegaly present.  Cardiovascular: Normal rate, regular rhythm, normal heart sounds and intact distal pulses.   No murmur heard. Pulmonary/Chest: Effort normal and breath sounds normal. No respiratory distress. She has no wheezes. She has no rales. She exhibits no tenderness.  Breasts are large. No skin change. No nipple discharge. No breast mass. In the left axilla there are some enlarged lymph nodes although these are not dramatically enlarged. There are mobile and nontender. No mass in the right axilla.  Abdominal: Soft. Bowel sounds are normal. She exhibits no distension and no mass. There is no tenderness. There is no rebound and no guarding.  Midline scar from xiphoid to umbilicus.  Genitourinary:  No inguinal adenopathy.  Musculoskeletal: She exhibits no edema and no tenderness.  Lymphadenopathy:    She has no cervical adenopathy.  Neurological: She is alert and oriented to person, place, and time. She exhibits normal muscle tone. Coordination normal.  Skin: Skin is warm. No rash noted. She is not diaphoretic. No erythema. No pallor.  Psychiatric: She has a normal mood and affect. Her behavior is normal. Judgment and thought content normal.    Data Reviewed Imaging studies. Surgical pathology report. Flow cytometry.  Assessment    Left axillary adenopathy. Imaging studies and biopsies are atypical suggesting lymphoproliferative disorder. Excisional biopsy for tissue diagnosis is indicated at this time  Morbid obesity  Hypertension  Depression  GERD  History of open hiatal hernia repair  History vaginal hysterectomy and left salpingo-oophorectomy.     Plan    Schedule for left axillary lymph node excision under general anesthesia as outpatient in the near future.  I told the patient and her husband how the incision would be made. They're aware of the fact that she may or may not need a drain. They are aware of the risk of bleeding, infection, seroma and other  unforeseen problems. They are aware of the risk of arm swelling and numbness.   I discussed the indications, details, techniques, and numerous risk of surgery with him. All their questions were answered. They understand all these issues. They agree with this plan.       Edsel Petrin. Dalbert Batman, M.D., [facility] General and Minimally invasive Surgery Breast and Colorectal Surgery Office:   [phone] Pager:   [phone]  03/02/2013, 4:07 PM

## 2013-03-06 ENCOUNTER — Encounter (HOSPITAL_COMMUNITY)
Admission: RE | Admit: 2013-03-06 | Discharge: 2013-03-06 | Disposition: A | Discharge status: Home / Self Care | Payer: BC Managed Care – PPO | Source: Ambulatory Visit | Attending: General Surgery | Admitting: General Surgery

## 2013-03-06 ENCOUNTER — Encounter (HOSPITAL_COMMUNITY): Payer: Self-pay | Admitting: Pharmacy Technician

## 2013-03-06 ENCOUNTER — Encounter (HOSPITAL_COMMUNITY): Payer: Self-pay

## 2013-03-06 ENCOUNTER — Ambulatory Visit (HOSPITAL_COMMUNITY)
Admission: RE | Admit: 2013-03-06 | Discharge: 2013-03-06 | Disposition: A | Discharge status: Home / Self Care | Payer: BC Managed Care – PPO | Source: Ambulatory Visit | Attending: General Surgery | Admitting: General Surgery

## 2013-03-06 DIAGNOSIS — K449 Diaphragmatic hernia without obstruction or gangrene: Secondary | ICD-10-CM | POA: Insufficient documentation

## 2013-03-06 DIAGNOSIS — Z01818 Encounter for other preprocedural examination: Secondary | ICD-10-CM | POA: Insufficient documentation

## 2013-03-06 DIAGNOSIS — Z01812 Encounter for preprocedural laboratory examination: Secondary | ICD-10-CM | POA: Insufficient documentation

## 2013-03-06 HISTORY — DX: Enlarged lymph nodes, unspecified: R59.9

## 2013-03-06 HISTORY — DX: Generalized enlarged lymph nodes: R59.1

## 2013-03-06 LAB — COMPREHENSIVE METABOLIC PANEL
ALT: 16 U/L (ref 0–35)
AST: 15 U/L (ref 0–37)
Albumin: 3.7 g/dL (ref 3.5–5.2)
Alkaline Phosphatase: 162 U/L — ABNORMAL HIGH (ref 39–117)
BUN: 11 mg/dL (ref 6–23)
CHLORIDE: 102 meq/L (ref 96–112)
CO2: 28 mEq/L (ref 19–32)
CREATININE: 0.8 mg/dL (ref 0.50–1.10)
Calcium: 9.8 mg/dL (ref 8.4–10.5)
GFR calc Af Amer: >90 mL/min (ref >90)
GFR calc non Af Amer: 85 mL/min — ABNORMAL LOW (ref >90)
Glucose, Bld: 76 mg/dL (ref 70–99)
POTASSIUM: 4.5 meq/L (ref 3.7–5.3)
Sodium: 142 mEq/L (ref 137–147)
Total Bilirubin: 0.5 mg/dL (ref 0.3–1.2)
Total Protein: 7.2 g/dL (ref 6.0–8.3)

## 2013-03-06 LAB — CBC WITH DIFFERENTIAL/PLATELET
BASOS PCT: 0 % (ref 0–1)
Basophils Absolute: 0 10*3/uL (ref 0.0–0.1)
Eosinophils Absolute: 0.1 10*3/uL (ref 0.0–0.7)
Eosinophils Relative: 2 % (ref 0–5)
HCT: 38.3 % (ref 36.0–46.0)
Hemoglobin: 12.4 g/dL (ref 12.0–15.0)
Lymphocytes Relative: 27 % (ref 12–46)
Lymphs Abs: 1.6 10*3/uL (ref 0.7–4.0)
MCH: 27.6 pg (ref 26.0–34.0)
MCHC: 32.4 g/dL (ref 30.0–36.0)
MCV: 85.3 fL (ref 78.0–100.0)
Monocytes Absolute: 0.4 10*3/uL (ref 0.1–1.0)
Monocytes Relative: 7 % (ref 3–12)
NEUTROS ABS: 3.7 10*3/uL (ref 1.7–7.7)
NEUTROS PCT: 64 % (ref 43–77)
Platelets: 328 10*3/uL (ref 150–400)
RBC: 4.49 MIL/uL (ref 3.87–5.11)
RDW: 13.5 % (ref 11.5–15.5)
WBC: 5.9 10*3/uL (ref 4.0–10.5)

## 2013-03-06 NOTE — Patient Instructions (Addendum)
Anna Jackson  03/06/2013                           YOUR PROCEDURE IS SCHEDULED ON: 03/09/13               PLEASE REPORT TO SHORT STAY CENTER AT : 8:00 AM               CALL THIS NUMBER IF ANY PROBLEMS THE DAY OF SURGERY :               [phone]                      REMEMBER:   Do not eat food or drink liquids AFTER MIDNIGHT   Take these medicines the morning of surgery with A SIP OF WATER: WELLBUTRIN / PROZAC / PRILOSEC   Do not wear jewelry, make-up   Do not wear lotions, powders, or perfumes.   Do not shave legs or underarms 12 hrs. before surgery (men may shave face)  Do not bring valuables to the hospital.  Contacts, dentures or bridgework may not be worn into surgery.  Leave suitcase in the car. After surgery it may be brought to your room.  For patients admitted to the hospital more than one night, checkout time is 11:00                          The day of discharge.   Patients discharged the day of surgery will not be allowed to drive home                             If going home same day of surgery, must have someone stay with you first                           24 hrs at home and arrange for some one to drive you home from hospital.    Special Instructions:   Please read over the following fact sheets that you were given:               1. DISCONTINUE ASPIRIN AND HERBAL MEDS 5 DAYS PREOP                      2. [place]                                                X_____________________________________________________________________        Failure to follow these instructions may result in cancellation of your surgery

## 2013-03-06 NOTE — Progress Notes (Signed)
03/06/13 0927  OBSTRUCTIVE SLEEP APNEA  Have you ever been diagnosed with sleep apnea through a sleep study? No  Do you snore loudly (loud enough to be heard through closed doors)?  1  Do you often feel tired, fatigued, or sleepy during the daytime? 1  Has anyone observed you stop breathing during your sleep? 0  Do you have, or are you being treated for high blood pressure? 1  BMI more than 35 kg/m2? 0  Age over 51 years old? 1  Neck circumference greater than 40 cm/18 inches? 0  Gender: 0  Obstructive Sleep Apnea Score 4  Score 4 or greater  Results sent to PCP

## 2013-03-08 NOTE — H&P (Signed)
Anna Jackson    MRN:  [id number]   Description: 53 year old female  Provider: [name], MD  Department: [facility]          Diagnoses      Axillary adenopathy    -  Primary      785.6            Current Vitals - Last Recorded      BP Pulse Temp(Src) Resp Ht Wt      130/88 76 97.6 F (36.4 C) (Temporal) 16 5\' 8"  (1.727 m) 232 lb 3.2 oz (105.325 kg)      BMI - 35.31 kg/m2                     History and Physical   [name], MD      Status: Signed            Patient ID: Anna Jackson, female   DOB: 07-Jan-1961, 53 y.o.   MRN: [id number]                 HPI Anna Jackson is a 53 y.o. female.  She is referred by Dr. Shelly Bombard at the breast center [facility] for evaluation and consideration of excisional biopsy of a left axillary lymph node. Dr. Garnet Koyanagi is her PCP.    The patient has no prior history of lymphoproliferative disorders, and she has no prior history of breast disease. About a month ago she felt a lump in her left axilla and it was a little bit sore. Mammograms were basically normal, but diagnostic left mammogram and ultrasound left breast showed multiple enlarged left axillary lymph nodes with abnormal cortical thickening, the largest said to be 3.4 cm and another adjacent 2.4 cm and several smaller but prominent nodes. There was a cyst in the left breast. Ultrasound-guided core biopsy without marker clip was evaluated by Dr. Gari Crown in pathology. This was read as atypical lymphoid proliferation. He did flow cytometry which showed T-cell with predominant helper phenotype but no monoclonal B-cell population identified. Excisional biopsy was strongly recommended. She is here with her husband today.   She denies any injury to the upper extremity.  No cats or dogs. This is minimally painful. No fever or chills. No night sweats.   Comorbidities include depression, well-controlled on Wellbutrin. Hypertension. GERD. Morbid  obesity. Status post open hiatal hernia repair by Lennie Hummer. Status post umbilical hernia repair by Dr. Ninfa Linden. Status post vaginal hysterectomy and left salpingo-oophorectomy.   Family history reveals breast cancer in maternal grandmother. Coronary artery disease and hypertension in mother and sister.No history of leukemia or lymphoma in family.        Past Medical History   Diagnosis  Date   .  Asthma     .  Hypertension     .  Anxiety     .  Headache(784.0)     .  Personal history of diseases of blood and blood-forming organs     .  Personal history of other diseases of digestive system     .  Esophageal reflux     .  Personal history of other diseases of digestive system     .  Urinary tract infection, site not specified     .  Asymptomatic postmenopausal status (age-related) (natural)     .  Acute sinusitis, unspecified     .  Routine general medical examination at a health care  facility     .  Anxiety state, unspecified     .  Pain in joint, lower leg     .  Sprain of thoracic region     .  Herpes simplex without mention of complication     .  Dermatophytosis of nail     .  Family history of malignant neoplasm of genital organ, other     .  Other postprocedural status(V45.89)     .  Unspecified essential hypertension     .  Headache(784.0)     .  Unspecified asthma(493.90)     .  Barrett esophagus     .  Anemia           Past Surgical History   Procedure  Laterality  Date   .  Cholecystectomy           with hernia repair   .  Abdominal hysterectomy    2006   .  Nissen fundoplication       .  Cervical conization w/bx       .  Tubal ligation       .  Knee surgery    02/2010       Right--arthroscopic         Family History   Problem  Relation  Age of Onset   .  Coronary artery disease           with hernia repair   .  Migraines       .  Hypertension       .  Breast cancer  Maternal Grandmother     .  Cancer  Maternal Grandmother         breast   .   Heart disease  Mother     .  Colon cancer  Neg Hx          Social History History   Substance Use Topics   .  Smoking status:  Never Smoker    .  Smokeless tobacco:  Never Used   .  Alcohol Use:  Yes         Comment: occ varity         Allergies   Allergen  Reactions   .  Codeine         REACTION: SICK-NAUSEATED         Current Outpatient Prescriptions   Medication  Sig  Dispense  Refill   .  ALPRAZolam (XANAX) 0.5 MG tablet  TAKE 1 TABLET BY MOUTH 3 TIMES DAILY AS NEEDED FOR SLEEP OR ANXIETY   60 tablet   0   .  buPROPion (WELLBUTRIN XL) 150 MG 24 hr tablet  TAKE 1 TABLET BY MOUTH DAILY   90 tablet   2   .  FLUoxetine (PROZAC) 40 MG capsule  TAKE 1 CAPSULE BY MOUTH DAILY   30 capsule   4   .  furosemide (LASIX) 20 MG tablet  TAKE 1 TABLET (20 MG TOTAL) BY MOUTH DAILY.   30 tablet   3   .  lisinopril (PRINIVIL,ZESTRIL) 20 MG tablet  TAKE 1 TABLET (20 MG TOTAL) BY MOUTH DAILY.   90 tablet   0   .  omeprazole (PRILOSEC) 10 MG capsule  Take 10 mg by mouth daily.            .        Review of Systems   Constitutional: Negative for fever, chills and unexpected weight  change.  HENT: Negative for congestion, hearing loss, sore throat, trouble swallowing and voice change.   Eyes: Negative for visual disturbance.  Respiratory: Negative for cough and wheezing.   Cardiovascular: Negative for chest pain, palpitations and leg swelling.  Gastrointestinal: Negative for nausea, vomiting, abdominal pain, diarrhea, constipation, blood in stool, abdominal distention and anal bleeding.  Genitourinary: Negative for hematuria, vaginal bleeding and difficulty urinating.  Musculoskeletal: Negative for arthralgias.  Skin: Negative for rash and wound.  Neurological: Negative for seizures, syncope and headaches.  Hematological: Negative for adenopathy. Does not bruise/bleed easily.  Psychiatric/Behavioral: Negative for confusion.      Blood pressure 130/88, pulse 76, temperature 97.6  F (36.4 C), temperature source Temporal, resp. rate 16, height 5\' 8"  (1.727 m), weight 232 lb 3.2 oz (105.325 kg).   Physical Exam  Constitutional: She is oriented to person, place, and time. She appears well-developed and well-nourished. No distress.  Height 5 feet 8. Weight 232.  HENT:   Head: Normocephalic and atraumatic.   Nose: Nose normal.   Mouth/Throat: No oropharyngeal exudate.  Eyes: Conjunctivae and EOM are normal. Pupils are equal, round, and reactive to light. Left eye exhibits no discharge. No scleral icterus.  Neck: Neck supple. No JVD present. No tracheal deviation present. No thyromegaly present.  Cardiovascular: Normal rate, regular rhythm, normal heart sounds and intact distal pulses.    No murmur heard. Pulmonary/Chest: Effort normal and breath sounds normal. No respiratory distress. She has no wheezes. She has no rales. She exhibits no tenderness.  Breasts are large. No skin change. No nipple discharge. No breast mass. In the left axilla there are some enlarged lymph nodes although these are not dramatically enlarged. There are mobile and nontender. No mass in the right axilla.  Abdominal: Soft. Bowel sounds are normal. She exhibits no distension and no mass. There is no tenderness. There is no rebound and no guarding.  Midline scar from xiphoid to umbilicus.  Genitourinary:  No inguinal adenopathy.  Musculoskeletal: She exhibits no edema and no tenderness.  Lymphadenopathy:    She has no cervical adenopathy.  Neurological: She is alert and oriented to person, place, and time. She exhibits normal muscle tone. Coordination normal.  Skin: Skin is warm. No rash noted. She is not diaphoretic. No erythema. No pallor.  Psychiatric: She has a normal mood and affect. Her behavior is normal. Judgment and thought content normal.      Data Reviewed Imaging studies. Surgical pathology report. Flow cytometry.   Assessment    Left axillary adenopathy. Imaging studies and  biopsies are atypical suggesting lymphoproliferative disorder. Excisional biopsy for tissue diagnosis is indicated at this time   Morbid obesity   Hypertension   Depression   GERD   History of open hiatal hernia repair   History vaginal hysterectomy and left salpingo-oophorectomy.      Plan    Schedule for left axillary lymph node excision under general anesthesia as outpatient in the near future.   I told the patient and her husband how the incision would be made. They're aware of the fact that she may or may not need a drain. They are aware of the risk of bleeding, infection, seroma and other unforeseen problems. They are aware of the risk of arm swelling and numbness.    I discussed the indications, details, techniques, and numerous risk of surgery with him. All their questions were answered. They understand all these issues. They agree with this plan.  Edsel Petrin. Dalbert Batman, M.D., [facility] General and Minimally invasive Surgery Breast and Colorectal Surgery Office:   [phone] Pager:   [phone]

## 2013-03-09 ENCOUNTER — Ambulatory Visit (HOSPITAL_COMMUNITY)
Admission: RE | Admit: 2013-03-09 | Discharge: 2013-03-09 | Disposition: A | Discharge status: Home / Self Care | Payer: BC Managed Care – PPO | Source: Ambulatory Visit | Attending: General Surgery | Admitting: General Surgery

## 2013-03-09 ENCOUNTER — Encounter (INDEPENDENT_AMBULATORY_CARE_PROVIDER_SITE_OTHER): Payer: Self-pay

## 2013-03-09 ENCOUNTER — Encounter (HOSPITAL_COMMUNITY)
Admission: RE | Disposition: A | Discharge status: Home / Self Care | Payer: Self-pay | Source: Ambulatory Visit | Attending: General Surgery

## 2013-03-09 ENCOUNTER — Encounter (HOSPITAL_COMMUNITY): Payer: BC Managed Care – PPO | Admitting: Certified Registered Nurse Anesthetist

## 2013-03-09 ENCOUNTER — Telehealth (INDEPENDENT_AMBULATORY_CARE_PROVIDER_SITE_OTHER): Payer: Self-pay

## 2013-03-09 ENCOUNTER — Encounter (HOSPITAL_COMMUNITY): Payer: Self-pay | Admitting: *Deleted

## 2013-03-09 ENCOUNTER — Ambulatory Visit (HOSPITAL_COMMUNITY): Payer: BC Managed Care – PPO | Admitting: Certified Registered Nurse Anesthetist

## 2013-03-09 DIAGNOSIS — I10 Essential (primary) hypertension: Secondary | ICD-10-CM | POA: Insufficient documentation

## 2013-03-09 DIAGNOSIS — Z803 Family history of malignant neoplasm of breast: Secondary | ICD-10-CM | POA: Insufficient documentation

## 2013-03-09 DIAGNOSIS — F329 Major depressive disorder, single episode, unspecified: Secondary | ICD-10-CM | POA: Insufficient documentation

## 2013-03-09 DIAGNOSIS — C8584 Other specified types of non-Hodgkin lymphoma, lymph nodes of axilla and upper limb: Secondary | ICD-10-CM

## 2013-03-09 DIAGNOSIS — R2232 Localized swelling, mass and lump, left upper limb: Secondary | ICD-10-CM

## 2013-03-09 DIAGNOSIS — F3289 Other specified depressive episodes: Secondary | ICD-10-CM | POA: Insufficient documentation

## 2013-03-09 DIAGNOSIS — Z79899 Other long term (current) drug therapy: Secondary | ICD-10-CM | POA: Insufficient documentation

## 2013-03-09 DIAGNOSIS — K219 Gastro-esophageal reflux disease without esophagitis: Secondary | ICD-10-CM | POA: Insufficient documentation

## 2013-03-09 DIAGNOSIS — IMO0002 Reserved for concepts with insufficient information to code with codable children: Secondary | ICD-10-CM | POA: Insufficient documentation

## 2013-03-09 DIAGNOSIS — D649 Anemia, unspecified: Secondary | ICD-10-CM | POA: Insufficient documentation

## 2013-03-09 HISTORY — PX: AXILLARY LYMPH NODE DISSECTION: SHX5229

## 2013-03-09 HISTORY — PX: BREAST EXCISIONAL BIOPSY: SUR124

## 2013-03-09 SURGERY — LYMPHADENECTOMY, AXILLARY
Anesthesia: General | Site: Axilla | Laterality: Left

## 2013-03-09 MED ORDER — MORPHINE SULFATE 10 MG/ML IJ SOLN
1.0000 mg | INTRAMUSCULAR | Status: DC | PRN
  Administered 2013-03-09 (×5): 2 mg via INTRAVENOUS

## 2013-03-09 MED ORDER — HYDROCODONE-ACETAMINOPHEN 5-325 MG PO TABS: 1.0000 | ORAL_TABLET | ORAL | Status: DC | PRN

## 2013-03-09 MED ORDER — 0.9 % SODIUM CHLORIDE (POUR BTL) OPTIME
TOPICAL | Status: DC | PRN
  Administered 2013-03-09: 1000 mL

## 2013-03-09 MED ORDER — CEFAZOLIN SODIUM-DEXTROSE 2-3 GM-% IV SOLR
2.0000 g | INTRAVENOUS | Status: AC
  Administered 2013-03-09: 2 g via INTRAVENOUS

## 2013-03-09 MED ORDER — ONDANSETRON HCL 4 MG/2ML IJ SOLN
INTRAMUSCULAR | Status: AC
  Filled 2013-03-09: qty 2

## 2013-03-09 MED ORDER — ONDANSETRON HCL 4 MG/2ML IJ SOLN
INTRAMUSCULAR | Status: DC | PRN
  Administered 2013-03-09: 4 mg via INTRAVENOUS

## 2013-03-09 MED ORDER — FENTANYL CITRATE 0.05 MG/ML IJ SOLN: 25.0000 ug | INTRAMUSCULAR | Status: DC | PRN

## 2013-03-09 MED ORDER — BUPIVACAINE-EPINEPHRINE PF 0.5-1:200000 % IJ SOLN
INTRAMUSCULAR | Status: AC
  Filled 2013-03-09: qty 30

## 2013-03-09 MED ORDER — MIDAZOLAM HCL 2 MG/2ML IJ SOLN
INTRAMUSCULAR | Status: AC
  Filled 2013-03-09: qty 2

## 2013-03-09 MED ORDER — LIDOCAINE HCL (CARDIAC) 20 MG/ML IV SOLN
INTRAVENOUS | Status: DC | PRN
  Administered 2013-03-09: 100 mg via INTRAVENOUS

## 2013-03-09 MED ORDER — SODIUM CHLORIDE 0.9 % IV SOLN: 250.0000 mL | INTRAVENOUS | Status: DC | PRN

## 2013-03-09 MED ORDER — BUPIVACAINE-EPINEPHRINE PF 0.5-1:200000 % IJ SOLN
INTRAMUSCULAR | Status: DC | PRN
  Administered 2013-03-09: 10 mL

## 2013-03-09 MED ORDER — LACTATED RINGERS IV SOLN: INTRAVENOUS | Status: DC

## 2013-03-09 MED ORDER — DEXAMETHASONE SODIUM PHOSPHATE 10 MG/ML IJ SOLN
INTRAMUSCULAR | Status: DC | PRN
  Administered 2013-03-09: 10 mg via INTRAVENOUS

## 2013-03-09 MED ORDER — MORPHINE SULFATE 10 MG/ML IJ SOLN
INTRAMUSCULAR | Status: AC
  Filled 2013-03-09: qty 1

## 2013-03-09 MED ORDER — HEMOSTATIC AGENTS (NO CHARGE) OPTIME
TOPICAL | Status: DC | PRN
  Administered 2013-03-09: 1 via TOPICAL

## 2013-03-09 MED ORDER — FENTANYL CITRATE 0.05 MG/ML IJ SOLN
INTRAMUSCULAR | Status: AC
  Filled 2013-03-09: qty 2

## 2013-03-09 MED ORDER — ACETAMINOPHEN 650 MG RE SUPP
650.0000 mg | RECTAL | Status: DC | PRN
  Filled 2013-03-09: qty 1

## 2013-03-09 MED ORDER — ACETAMINOPHEN 325 MG PO TABS: 650.0000 mg | ORAL_TABLET | ORAL | Status: DC | PRN

## 2013-03-09 MED ORDER — ONDANSETRON HCL 4 MG/2ML IJ SOLN: 4.0000 mg | Freq: Four times a day (QID) | INTRAMUSCULAR | Status: DC | PRN

## 2013-03-09 MED ORDER — LACTATED RINGERS IV SOLN
INTRAVENOUS | Status: DC
  Administered 2013-03-09: 10:00:00 via INTRAVENOUS
  Administered 2013-03-09: 1000 mL via INTRAVENOUS

## 2013-03-09 MED ORDER — PROMETHAZINE HCL 25 MG/ML IJ SOLN
INTRAMUSCULAR | Status: AC
  Filled 2013-03-09: qty 1

## 2013-03-09 MED ORDER — CHLORHEXIDINE GLUCONATE 4 % EX LIQD: 1.0000 "application " | Freq: Once | CUTANEOUS | Status: DC

## 2013-03-09 MED ORDER — PROPOFOL 10 MG/ML IV BOLUS
INTRAVENOUS | Status: AC
  Filled 2013-03-09: qty 20

## 2013-03-09 MED ORDER — DEXAMETHASONE SODIUM PHOSPHATE 10 MG/ML IJ SOLN
INTRAMUSCULAR | Status: AC
  Filled 2013-03-09: qty 1

## 2013-03-09 MED ORDER — MIDAZOLAM HCL 5 MG/5ML IJ SOLN
INTRAMUSCULAR | Status: DC | PRN
  Administered 2013-03-09: 2 mg via INTRAVENOUS

## 2013-03-09 MED ORDER — EPHEDRINE SULFATE 50 MG/ML IJ SOLN
INTRAMUSCULAR | Status: DC | PRN
  Administered 2013-03-09 (×2): 10 mg via INTRAVENOUS

## 2013-03-09 MED ORDER — SODIUM CHLORIDE 0.9 % IV SOLN: INTRAVENOUS | Status: DC

## 2013-03-09 MED ORDER — SODIUM CHLORIDE 0.9 % IJ SOLN: 3.0000 mL | INTRAMUSCULAR | Status: DC | PRN

## 2013-03-09 MED ORDER — PROPOFOL 10 MG/ML IV BOLUS
INTRAVENOUS | Status: DC | PRN
  Administered 2013-03-09: 200 mg via INTRAVENOUS

## 2013-03-09 MED ORDER — FENTANYL CITRATE 0.05 MG/ML IJ SOLN
INTRAMUSCULAR | Status: AC
  Filled 2013-03-09: qty 5

## 2013-03-09 MED ORDER — SODIUM CHLORIDE 0.9 % IJ SOLN: 3.0000 mL | Freq: Two times a day (BID) | INTRAMUSCULAR | Status: DC

## 2013-03-09 MED ORDER — PROMETHAZINE HCL 25 MG/ML IJ SOLN
6.2500 mg | INTRAMUSCULAR | Status: DC | PRN
  Administered 2013-03-09: 6.25 mg via INTRAVENOUS

## 2013-03-09 MED ORDER — CEFAZOLIN SODIUM-DEXTROSE 2-3 GM-% IV SOLR
INTRAVENOUS | Status: AC
  Filled 2013-03-09: qty 50

## 2013-03-09 MED ORDER — EPHEDRINE SULFATE 50 MG/ML IJ SOLN
INTRAMUSCULAR | Status: AC
  Filled 2013-03-09: qty 1

## 2013-03-09 MED ORDER — OXYCODONE HCL 5 MG PO TABS
5.0000 mg | ORAL_TABLET | ORAL | Status: DC | PRN
  Administered 2013-03-09: 5 mg via ORAL
  Filled 2013-03-09: qty 1

## 2013-03-09 MED ORDER — FENTANYL CITRATE 0.05 MG/ML IJ SOLN
INTRAMUSCULAR | Status: DC | PRN
  Administered 2013-03-09: 100 ug via INTRAVENOUS
  Administered 2013-03-09 (×3): 50 ug via INTRAVENOUS

## 2013-03-09 MED ORDER — SODIUM CHLORIDE 0.9 % IJ SOLN
INTRAMUSCULAR | Status: AC
  Filled 2013-03-09: qty 10

## 2013-03-09 MED ORDER — LIDOCAINE HCL (CARDIAC) 20 MG/ML IV SOLN
INTRAVENOUS | Status: AC
  Filled 2013-03-09: qty 5

## 2013-03-09 SURGICAL SUPPLY — 39 items
APPLIER CLIP 11 MED OPEN (CLIP)
APPLIER CLIP 9.375 MED OPEN (MISCELLANEOUS) ×3
BENZOIN TINCTURE PRP APPL 2/3 (GAUZE/BANDAGES/DRESSINGS) IMPLANT
BLADE HEX COATED 2.75 (ELECTRODE) ×3 IMPLANT
BLADE SURG 15 STRL LF DISP TIS (BLADE) ×1 IMPLANT
BLADE SURG 15 STRL SS (BLADE) ×2
BLADE SURG SZ10 CARB STEEL (BLADE) ×3 IMPLANT
CANISTER SUCTION 2500CC (MISCELLANEOUS) IMPLANT
CLIP APPLIE 11 MED OPEN (CLIP) IMPLANT
CLIP APPLIE 9.375 MED OPEN (MISCELLANEOUS) ×1 IMPLANT
CLOSURE WOUND 1/2 X4 (GAUZE/BANDAGES/DRESSINGS)
DECANTER SPIKE VIAL GLASS SM (MISCELLANEOUS) ×3 IMPLANT
DERMABOND ADVANCED (GAUZE/BANDAGES/DRESSINGS) ×2
DERMABOND ADVANCED .7 DNX12 (GAUZE/BANDAGES/DRESSINGS) ×1 IMPLANT
DRAPE LAPAROSCOPIC ABDOMINAL (DRAPES) ×3 IMPLANT
ELECT REM PT RETURN 9FT ADLT (ELECTROSURGICAL) ×3
ELECTRODE REM PT RTRN 9FT ADLT (ELECTROSURGICAL) ×1 IMPLANT
GAUZE SPONGE 4X4 16PLY XRAY LF (GAUZE/BANDAGES/DRESSINGS) IMPLANT
GLOVE BIOGEL PI IND STRL 7.0 (GLOVE) ×1 IMPLANT
GLOVE BIOGEL PI INDICATOR 7.0 (GLOVE) ×2
GLOVE EUDERMIC 7 POWDERFREE (GLOVE) ×3 IMPLANT
GOWN STRL REUS W/TWL LRG LVL3 (GOWN DISPOSABLE) ×3 IMPLANT
GOWN STRL REUS W/TWL XL LVL3 (GOWN DISPOSABLE) ×6 IMPLANT
HEMOSTAT SNOW SURGICEL 2X4 (HEMOSTASIS) ×3 IMPLANT
KIT BASIN OR (CUSTOM PROCEDURE TRAY) ×3 IMPLANT
MARKER SKIN DUAL TIP RULER LAB (MISCELLANEOUS) ×3 IMPLANT
NEEDLE HYPO 25X1 1.5 SAFETY (NEEDLE) ×3 IMPLANT
NS IRRIG 1000ML POUR BTL (IV SOLUTION) ×3 IMPLANT
PACK BASIC VI WITH GOWN DISP (CUSTOM PROCEDURE TRAY) ×3 IMPLANT
PENCIL BUTTON HOLSTER BLD 10FT (ELECTRODE) ×3 IMPLANT
SPONGE GAUZE 4X4 12PLY (GAUZE/BANDAGES/DRESSINGS) IMPLANT
SPONGE LAP 18X18 X RAY DECT (DISPOSABLE) ×3 IMPLANT
STRIP CLOSURE SKIN 1/2X4 (GAUZE/BANDAGES/DRESSINGS) IMPLANT
SUT MNCRL AB 4-0 PS2 18 (SUTURE) ×3 IMPLANT
SUT VIC AB 3-0 SH 18 (SUTURE) ×3 IMPLANT
SYR BULB IRRIGATION 50ML (SYRINGE) ×3 IMPLANT
SYR CONTROL 10ML LL (SYRINGE) ×3 IMPLANT
TOWEL OR 17X26 10 PK STRL BLUE (TOWEL DISPOSABLE) ×3 IMPLANT
YANKAUER SUCT BULB TIP 10FT TU (MISCELLANEOUS) ×3 IMPLANT

## 2013-03-09 NOTE — Telephone Encounter (Signed)
I returned the call to [facility].  He had many questions.  He spoke to Dr Dalbert Batman today at the time of surgery.  He states she can return to work on Monday if feeling up to it.  She does a desk job.  He wants a note for her to be able to go back and if she doesn't feel up to it she will let us know.  He wants me to email it to her at [email or web address].  He asked about when she can shower.  Her paper said 24 hours.  I told him to wait until tomorrow afternoon.  I advised the glue on her incision will flake and peel off in 2 -3 weeks.  I advised she can't drive or work while on the pain medicine.  He said she may not even take much of it but may take Tylenol.  I advised she can take Advil or Ibuprofen.  He states he can drive her to work Monday because she may be sore and the seat belt will go right against her incision on the left side.  I agreed she may be sore.  The pt wants to save her time off in case she gets a bad diagnosis and she may need further treatment.  All questions were answered.  Pt has a follow up appointment already scheduled.

## 2013-03-09 NOTE — Anesthesia Postprocedure Evaluation (Signed)
Anesthesia Post Note  Patient: Anna Jackson  Procedure(s) Performed: Procedure(s) (LRB): EXCISION LEFT AXILLARY LYMPH NODES (Left)  Anesthesia type: General  Patient location: PACU  Post pain: Pain level controlled  Post assessment: Post-op Vital signs reviewed  Last Vitals:  Filed Vitals:   03/09/13 1130  BP: 142/76  Pulse: 85  Temp: 36.3 C  Resp: 14    Post vital signs: Reviewed  Level of consciousness: sedated  Complications: No apparent anesthesia complications

## 2013-03-09 NOTE — Transfer of Care (Signed)
Immediate Anesthesia Transfer of Care Note  Patient: Anna Jackson  Procedure(s) Performed: Procedure(s): EXCISION LEFT AXILLARY LYMPH NODES (Left)  Patient Location: PACU  Anesthesia Type:General  Level of Consciousness: sedated  Airway & Oxygen Therapy: Patient Spontanous Breathing and Patient connected to face mask oxygen  Post-op Assessment: Report given to PACU RN and Post -op Vital signs reviewed and stable  Post vital signs: Reviewed and stable  Complications: No apparent anesthesia complications

## 2013-03-09 NOTE — Op Note (Signed)
Patient Name:           Anna Jackson   Date of Surgery:        03/09/2013  Pre op Diagnosis:      Left axillary lymphadenopathy  Post op Diagnosis:    Same  Procedure:                 Excision of two left axillary lymph nodes  Surgeon:                     Edsel Petrin. Dalbert Batman, M.D., FACS  Assistant:                      none  Operative Indications:   Anna Jackson is a 53 y.o. female. She is referred by Dr. Shelly Bombard at the breast center [facility] for evaluation and consideration of excisional biopsy of a left axillary lymph node. Dr. Garnet Koyanagi is her PCP.  The patient has no prior history of lymphoproliferative disorders, and she has no prior history of breast disease. About a month ago she felt a lump in her left axilla and it was a little bit sore. Mammograms were basically normal, but diagnostic left mammogram and ultrasound left breast showed multiple enlarged left axillary lymph nodes with abnormal cortical thickening, the largest said to be 3.4 cm and another adjacent 2.4 cm and several smaller but prominent nodes.  Ultrasound-guided core biopsy without marker clip was evaluated by Dr. Gari Crown in pathology. This was read as atypical lymphoid proliferation. He did flow cytometry which showed T-cell with predominant helper phenotype but no monoclonal B-cell population identified. Excisional biopsy was strongly recommended.  She denies any injury to the upper extremity. No cats or dogs. This is minimally painful. No fever or chills. No night sweats. My exam confirms some slightly enlarged left axillary lymph nodes. Family history reveals breast cancer in maternal grandmother. Coronary artery disease and hypertension in mother and sister.No history of leukemia or lymphoma in family.   Operative Findings:       There were 4 or 5 abnormally enlarged lymph nodes in the left axilla. These were rubbery and mobile. No infection.There were 2 lymph nodes adjacent to each other that I excised.  The total dimensions of the lymph nodes excised was 3 cm x 2 cm. Dr. Gari Crown said that this was adequate tissue for lymphoma workup.  Procedure in Detail:          Following the induction of general LMA anesthesia the patient's left axilla was prepped and draped in a sterile fashion. Surgical time out was performed. Intravenous antibodies were given. 0.5% Marcaine with epinephrine was used as a local infiltration anesthetic. Transverse incision was made in the left axilla at the hairline. Dissection was carried down through the subcutaneous tissue. The clavipectoral fascia was incised. Retractors were placed. I could palpate several mobile abnormal lymph nodes. In level 1 I found 2 lymph nodes that were immediately adjacent and somewhat adherent to each other. Lymphatics and blood vessels  were controlled with metal clips or Vicryl ties and the lymph node was excised. It was sent fresh to the lab and Dr. Gari Crown said we had adequate tissue. The wound was irrigated with saline. Hemostasis was excellent. The deeper soft tissues were closed with interrupted sutures of 3-0 Vicryl and the skin closed with a running subcuticular suture of 4-0 Monocryl and Dermabond. The patient tolerated the procedure well was taken to PACU in stable  condition. EBL 15 cc. Counts correct. Complications none.    Edsel Petrin. Dalbert Batman, M.D., FACS General and Minimally Invasive Surgery Breast and Colorectal Surgery  03/09/2013 10:31 AM

## 2013-03-09 NOTE — Discharge Instructions (Signed)
You may return to work Monday, if everything seems to be doing well.  You may shower, starting on Saturday. Do not take a tub bath  You may drive a car when completely comfortable and alert   No sports or heavy lifting for 2-3 weeks  Return to see Dr. Dalbert Batman in 2 weeks  Hopefully, we can call the pathology report to you next Tuesday.

## 2013-03-09 NOTE — Telephone Encounter (Signed)
Please call patient , husband  is asking for a return to work note

## 2013-03-09 NOTE — Anesthesia Preprocedure Evaluation (Addendum)
Anesthesia Evaluation  Patient identified by MRN, date of birth, ID band Patient awake    Reviewed: Allergy & Precautions, H&P , NPO status , Patient's Chart, lab work & pertinent test results  Airway Mallampati: II TM Distance: >3 FB Neck ROM: Full    Dental  (+) Teeth Intact and Dental Advisory Given   Pulmonary asthma ,  breath sounds clear to auscultation  Pulmonary exam normal       Cardiovascular hypertension, Pt. on medications Rhythm:Regular Rate:Normal     Neuro/Psych Anxiety Depression negative neurological ROS     GI/Hepatic Neg liver ROS, GERD-  Medicated,  Endo/Other  Morbid obesity  Renal/GU negative Renal ROS  negative genitourinary   Musculoskeletal negative musculoskeletal ROS (+)   Abdominal   Peds  Hematology  (+) Blood dyscrasia, anemia ,   Anesthesia Other Findings   Reproductive/Obstetrics                          Anesthesia Physical Anesthesia Plan  ASA: II  Anesthesia Plan: General   Post-op Pain Management:    Induction: Intravenous  Airway Management Planned: LMA  Additional Equipment:   Intra-op Plan:   Post-operative Plan: Extubation in OR  Informed Consent: I have reviewed the patients History and Physical, chart, labs and discussed the procedure including the risks, benefits and alternatives for the proposed anesthesia with the patient or authorized representative who has indicated his/her understanding and acceptance.   Dental advisory given  Plan Discussed with: CRNA  Anesthesia Plan Comments:         Anesthesia Quick Evaluation

## 2013-03-09 NOTE — Interval H&P Note (Signed)
History and Physical Interval Note:  03/09/2013 8:47 AM  Anna Jackson  has presented today for surgery, with the diagnosis of axillary lymphadenopathy  The various methods of treatment have been discussed with the patient and family. After consideration of risks, benefits and other options for treatment, the patient has consented to  Procedure(s): EXCISION LEFT AXILLARY LYMPH NODES (Left) as a surgical intervention .  The patient's history has been reviewed, patient examined, no change in status, stable for surgery.  I have reviewed the patient's chart and labs.  Questions were answered to the patient's satisfaction.     [name]

## 2013-03-12 ENCOUNTER — Encounter (HOSPITAL_COMMUNITY): Payer: Self-pay | Admitting: General Surgery

## 2013-03-13 ENCOUNTER — Telehealth: Payer: Self-pay | Admitting: Hematology and Oncology

## 2013-03-13 ENCOUNTER — Other Ambulatory Visit (INDEPENDENT_AMBULATORY_CARE_PROVIDER_SITE_OTHER): Payer: Self-pay

## 2013-03-13 ENCOUNTER — Telehealth: Payer: Self-pay | Admitting: Family Medicine

## 2013-03-13 ENCOUNTER — Telehealth (INDEPENDENT_AMBULATORY_CARE_PROVIDER_SITE_OTHER): Payer: Self-pay | Admitting: General Surgery

## 2013-03-13 DIAGNOSIS — IMO0002 Reserved for concepts with insufficient information to code with codable children: Secondary | ICD-10-CM

## 2013-03-13 NOTE — Telephone Encounter (Signed)
I received a verbal report from Dr. 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  I called Ms. Lepore and discussed this with her. I told her that she would need to be referred to a medical oncologist that she would most likely need some form of staging workup and chemotherapy. We talked about this for some time and she understands that fairly well.She is not having any trouble with wound healing.  The patient is referred to Dr. Heath Lark at [facility].  Patient will follow with me in the office in about 2 weeks.   Edsel Petrin. Dalbert Batman, M.D., [facility] General and Minimally invasive Surgery Breast and Colorectal Surgery Office:   [phone] Pager:   [phone]

## 2013-03-13 NOTE — Telephone Encounter (Signed)
C/D 03/13/13 for appt. 03/20/13 °

## 2013-03-13 NOTE — Telephone Encounter (Signed)
Error. BC °

## 2013-03-14 ENCOUNTER — Encounter: Payer: Self-pay | Admitting: Family Medicine

## 2013-03-14 NOTE — Telephone Encounter (Signed)
Letter detailing need for Saphris as a sleep aid. Wellbutrin and Prozac were prescribed after the sudden death of the patients son. Patient is attempting to get life insurance and needs letter.

## 2013-03-15 ENCOUNTER — Other Ambulatory Visit: Payer: BC Managed Care – PPO

## 2013-03-15 ENCOUNTER — Telehealth (INDEPENDENT_AMBULATORY_CARE_PROVIDER_SITE_OTHER): Payer: Self-pay | Admitting: *Deleted

## 2013-03-15 NOTE — Telephone Encounter (Signed)
Rolena Infante called and asked to speak directly with Clarise Cruz only.  Explained that she was in a room with a patient at this time but I would pass the message on.  He states understanding and agreeable at this time.

## 2013-03-15 NOTE — Telephone Encounter (Signed)
I called him back and he had several questions.  He asked about heavy lifting restrictions for the pt.  I advised wait at least 2 weeks or until she sees Dr Dalbert Batman back since she has a large incision to make sure she is healing ok.  He asked about a short [employer] form she needs filled out and FMLA.  I advised there may be $20 fees for both of these forms but they can be done here.  I told him Dr Dalbert Batman will keep her out of work on the fmla to cover his surgery and recovery and to see the Oncologist.  Beyond that the Oncologist will pick up coverage and extend leave if she needs it for treatment.  He will see if she can fax me the forms.  I also gave my email to assist them in getting forms to [place].  I stated she will need to sign a release form for [place] to send information to [facility] and her work.

## 2013-03-16 ENCOUNTER — Telehealth: Payer: Self-pay

## 2013-03-16 NOTE — Telephone Encounter (Signed)
Pt needs a referral for pulm   Dx sleep apnea--- per anesthesia    ----- Message -----       From: Koren Shiver, RN       Sent: 03/06/2013   3:33 PM         To: Rosalita Chessman, DO    Subject: Inpatient Notes                                       Your patient has screened at an elevated risk for obstructive sleep apnea using the STOP-BANG tool during a presurgical visit. A score of 4 or greater is an elevated risk.    My-chart message sent to the patient.       KP

## 2013-03-20 ENCOUNTER — Telehealth (INDEPENDENT_AMBULATORY_CARE_PROVIDER_SITE_OTHER): Payer: Self-pay | Admitting: *Deleted

## 2013-03-20 ENCOUNTER — Encounter: Payer: Self-pay | Admitting: Hematology and Oncology

## 2013-03-20 ENCOUNTER — Other Ambulatory Visit (HOSPITAL_COMMUNITY): Payer: Self-pay

## 2013-03-20 ENCOUNTER — Other Ambulatory Visit (INDEPENDENT_AMBULATORY_CARE_PROVIDER_SITE_OTHER): Payer: Self-pay | Admitting: General Surgery

## 2013-03-20 ENCOUNTER — Telehealth: Payer: Self-pay | Admitting: Hematology and Oncology

## 2013-03-20 ENCOUNTER — Ambulatory Visit (HOSPITAL_BASED_OUTPATIENT_CLINIC_OR_DEPARTMENT_OTHER): Payer: BC Managed Care – PPO | Admitting: Hematology and Oncology

## 2013-03-20 ENCOUNTER — Other Ambulatory Visit: Payer: Self-pay | Admitting: Hematology and Oncology

## 2013-03-20 ENCOUNTER — Ambulatory Visit: Payer: BC Managed Care – PPO

## 2013-03-20 VITALS — BP 128/75 | HR 73 | Temp 97.0°F | Resp 18 | Ht 68.0 in | Wt 228.3 lb

## 2013-03-20 DIAGNOSIS — C8590 Non-Hodgkin lymphoma, unspecified, unspecified site: Secondary | ICD-10-CM

## 2013-03-20 DIAGNOSIS — IMO0002 Reserved for concepts with insufficient information to code with codable children: Secondary | ICD-10-CM

## 2013-03-20 DIAGNOSIS — R61 Generalized hyperhidrosis: Secondary | ICD-10-CM

## 2013-03-20 DIAGNOSIS — Z8579 Personal history of other malignant neoplasms of lymphoid, hematopoietic and related tissues: Secondary | ICD-10-CM | POA: Insufficient documentation

## 2013-03-20 DIAGNOSIS — R630 Anorexia: Secondary | ICD-10-CM

## 2013-03-20 HISTORY — DX: Non-Hodgkin lymphoma, unspecified, unspecified site: C85.90

## 2013-03-20 NOTE — Progress Notes (Signed)
[facility] CONSULT NOTE  Patient Care Team: Rosalita Chessman, DO as PCP - General  CHIEF COMPLAINTS/PURPOSE OF CONSULTATION:  T cell lymphoma  HISTORY OF PRESENTING ILLNESS:  Anna Jackson 53 y.o. female is here because of recent diagnosis of lymphoma Around mid-December, she palpated a mass in her left axilla with associated tenderness She subsequently had mammogram and [place] which only showed abnormal lymphadenopathy with no associated breast abnormalities She had been complaining of anorexia and reported 10 pounds weight loss She has occasional sweats but thought this is due to hot flashes. Subsequently, she was referred to general surgery and underwent lymph node biopsy which confirmed diagnosis of lymphoma. She denies complication from surgery. Her wound is healing well  MEDICAL HISTORY:  Past Medical History  Diagnosis Date  . Hypertension   . Anxiety   . Esophageal reflux   . Anxiety state, unspecified   . Herpes simplex without mention of complication   . Unspecified essential hypertension   . Barrett esophagus   . Anemia   . Asthma     no problem with asthma in several years  . Unspecified asthma(493.90)   . Adenopathy     left axillary  . Lymphoma, T-cell 03/20/2013    SURGICAL HISTORY: Past Surgical History  Procedure Laterality Date  . Cholecystectomy      with hernia repair  . Abdominal hysterectomy  2006  . Nissen fundoplication    . Cervical conization w/bx    . Tubal ligation    . Knee surgery  02/2010    Right--arthroscopic  . Axillary lymph node dissection Left 03/09/2013    Procedure: EXCISION LEFT AXILLARY LYMPH NODES;  Surgeon: [name], MD;  Location: [facility];  Service: General;  Laterality: Left;    SOCIAL HISTORY: History   Social History  . Marital Status: Single    Spouse Name: N/A    Number of Children: 1  . Years of Education: N/A   Occupational History  . RECEIVING CLERK    Social History Main Topics  .  Smoking status: Never Smoker   . Smokeless tobacco: Never Used  . Alcohol Use: Yes     Comment: occasional  . Drug Use: No  . Sexual Activity: Not Currently    Partners: Male   Other Topics Concern  . Not on file   Social History Narrative   Exercise--  treadmill    FAMILY HISTORY: Family History  Problem Relation Age of Onset  . Coronary artery disease      with hernia repair  . Migraines    . Hypertension    . Breast cancer Maternal Grandmother   . Cancer Maternal Grandmother 17    breast  . Heart disease Mother   . Colon cancer Neg Hx     ALLERGIES:  is allergic to codeine.  MEDICATIONS:  Current Outpatient Prescriptions  Medication Sig Dispense Refill  . buPROPion (WELLBUTRIN XL) 150 MG 24 hr tablet Take 150 mg by mouth every morning.      Marland Kitchen FLUoxetine (PROZAC) 40 MG capsule Take 40 mg by mouth every morning.      . furosemide (LASIX) 20 MG tablet Take 20 mg by mouth every morning.      Marland Kitchen lisinopril-hydrochlorothiazide (PRINZIDE,ZESTORETIC) 20-25 MG per tablet Take 1 tablet by mouth every morning.      Marland Kitchen omeprazole (PRILOSEC OTC) 20 MG tablet Take 20 mg by mouth daily.       No current facility-administered medications  for this visit.    REVIEW OF SYSTEMS:   Constitutional: Denies fevers, chills or abnormal night sweats Eyes: Denies blurriness of vision, double vision or watery eyes Ears, nose, mouth, throat, and face: Denies mucositis or sore throat Respiratory: Denies cough, dyspnea or wheezes Cardiovascular: Denies palpitation, chest discomfort or lower extremity swelling Gastrointestinal:  Denies nausea, heartburn or change in bowel habits Skin: Denies abnormal skin rashes Lymphatics: Denies new lymphadenopathy or easy bruising Neurological:Denies numbness, tingling or new weaknesses Behavioral/Psych: Mood is stable, no new changes  All other systems were reviewed with the patient and are negative.  PHYSICAL EXAMINATION: ECOG PERFORMANCE STATUS: 0 -  Asymptomatic  Filed Vitals:   03/20/13 1357  BP: 128/75  Pulse: 73  Temp: 97 F (36.1 C)  Resp: 18   Filed Weights   03/20/13 1357  Weight: 228 lb 4.8 oz (103.556 kg)    GENERAL:alert, no distress and comfortable. She is morbidly obese SKIN: skin color, texture, turgor are normal, no rashes or significant lesions EYES: normal, conjunctiva are pink and non-injected, sclera clear OROPHARYNX:no exudate, no erythema and lips, buccal mucosa, and tongue normal  NECK: supple, thyroid normal size, non-tender, without nodularity LYMPH:  No other palpable lymphadenopathy in the cervical, axillary or inguinal. Her recent scar is well healed. There is a palpable fullness on her left axilla LUNGS: clear to auscultation and percussion with normal breathing effort HEART: regular rate & rhythm and no murmurs and no lower extremity edema ABDOMEN:abdomen soft, non-tender and normal bowel sounds Musculoskeletal:no cyanosis of digits and no clubbing  PSYCH: alert & oriented x 3 with fluent speech NEURO: no focal motor/sensory deficits  LABORATORY DATA:  I have reviewed the data as listed Lab Results  Component Value Date   WBC 5.9 03/06/2013   HGB 12.4 03/06/2013   HCT 38.3 03/06/2013   MCV 85.3 03/06/2013   PLT 328 03/06/2013   Lab Results  Component Value Date   NA 142 03/06/2013   K 4.5 03/06/2013   CL 102 03/06/2013   CO2 28 03/06/2013    RADIOGRAPHIC STUDIES: I have personally reviewed the radiological images as listed and agreed with the findings in the report. Chest 2 View  03/06/2013   CLINICAL DATA:  Preoperative films.  EXAM: CHEST  2 VIEW  COMPARISON:  None.  FINDINGS: The lungs are clear. Heart size is normal. Hiatal hernia is noted. Surgical clips are seen in the upper abdomen.  IMPRESSION: No acute disease.  Hiatal hernia.   Electronically Signed   By: Inge Rise M.D.   On: 03/06/2013 10:43    ASSESSMENT & PLAN: #1 Anaplastic T cell lymphoma We discussed the approach to  lymphoma treatment She will need staging with PET scan & BM biopsy I recommend placement of infusaport She will need baseline ECHO prior to treatment At present time, there is no clinical trial available at the [place] website for treatment naive patients I reviewed with her current NCCN guidelines briefly I plan to see her back in 2 weeks to review all test results and discuss treatment  Most likely I will plan on using CHOP plus etoposide as treatment of choice for her I will get her case presented at the next Hematology Tumor Board  Orders Placed This Encounter  Procedures  . NM PET Image Initial (PI) Skull Base To Thigh    Standing Status: Future     Number of Occurrences:      Standing Expiration Date: 05/20/2014    Order Specific  Question:  Reason for Exam (SYMPTOM  OR DIAGNOSIS REQUIRED)    Answer:  staging lymphoma    Order Specific Question:  Is the patient pregnant?    Answer:  No    Order Specific Question:  Preferred imaging location?    Answer:  [facility]  . CT Biopsy    Standing Status: Future     Number of Occurrences:      Standing Expiration Date: 03/20/2014    Order Specific Question:  Reason for Exam (SYMPTOM  OR DIAGNOSIS REQUIRED)    Answer:  BM biopsy staging lymphoma    Order Specific Question:  Preferred imaging location?    Answer:  [facility]    Order Specific Question:  Is the patient pregnant?    Answer:  No  . 2D Echocardiogram without contrast    Standing Status: Future     Number of Occurrences:      Standing Expiration Date: 03/20/2014    Order Specific Question:  Type of Echo    Answer:  Limited    Order Specific Question:  Reason for Exam    Answer:  need EF    Order Specific Question:  Where should this test be performed    Answer:  [facility]    All questions were answered. The patient knows to call the clinic with any problems, questions or concerns. I spent 40 minutes counseling the patient face to face. The total time  spent in the appointment was 60 minutes and more than 50% was on counseling.     [facility], [place], MD 03/20/2013 7:29 PM

## 2013-03-20 NOTE — Telephone Encounter (Signed)
Phone call noted. I had entered preprocedure orders in EPIC .   I have sent a posting sheet via the in-box to scheduling. The patient is due to start chemotherapy on February 18, so we need to make this an urgent case.   hmi

## 2013-03-20 NOTE — Telephone Encounter (Signed)
Gave pt appt for lab,md and chemo class, for february 2015 called Anna Jackson regarding 2Decho needs precert. emailed michelle regarding chemo

## 2013-03-20 NOTE — Progress Notes (Signed)
Checked in new pt with no financial concerns. °

## 2013-03-20 NOTE — Telephone Encounter (Signed)
Called pt left message regarding 2-D echo for 2/9 precerted by Benedetto Goad

## 2013-03-20 NOTE — Telephone Encounter (Signed)
Mr. Rolena Infante called to report that patient was seen by Dr. Alvy Bimler and they were told to call to make Dr. Dalbert Batman aware a PAC insert is needed.  Mr. Rolena Infante reported that patient is due to start chemo on Feb 18th and another appt with Dr. Alvy Bimler on the 17th.  He is also asking about picking up [employer] paperwork that he had dropped off to [place].  I explained that I would send a message to Dr. Dalbert Batman to make him aware that a PAC is needed and that I would copy Clarise Cruz on it regarding the paperwork.  Mr. Rolena Infante states understanding and agreeable with plan at this time.

## 2013-03-21 ENCOUNTER — Encounter: Payer: Self-pay | Admitting: *Deleted

## 2013-03-21 ENCOUNTER — Telehealth: Payer: Self-pay | Admitting: Hematology and Oncology

## 2013-03-21 ENCOUNTER — Telehealth: Payer: Self-pay | Admitting: *Deleted

## 2013-03-21 ENCOUNTER — Encounter: Payer: Self-pay | Admitting: Hematology and Oncology

## 2013-03-21 NOTE — Telephone Encounter (Signed)
Talked to pt, she is aware of all appts due to " My Chart"

## 2013-03-21 NOTE — Progress Notes (Signed)
MD filled out fmla form for patient; put in registration desk.

## 2013-03-21 NOTE — H&P (Signed)
Anna Jackson   MRN:  [id number]   Description: 53 year old female  Provider: [name], MD  Department: [facility]          Diagnoses     lymphoma         Current Vitals - Last Recorded      BP Pulse Temp(Src) Resp Ht Wt      130/88 76 97.6 F (36.4 C) (Temporal) 16 _0  (1.727 m) 232 lb 3.2 oz (105.325 kg)     BMI - 35.31 kg/m2                      History and Physical   [name], MD       Status: Signed            Patient ID: Anna Jackson, female   DOB: [birth year], 53 y.o.   MRN: [id number]             HPI Anna Jackson is a 53 y.o. female. . Dr. Garnet Koyanagi is her PCP.  Dr. Alvy Bimler is her oncologist.    The patient has no prior history of lymphoproliferative disorders, and she has no prior history of breast disease. About a month ago she felt a lump in her left axilla and it was a little bit sore. Mammograms were basically normal, but diagnostic left mammogram and ultrasound left breast showed multiple enlarged left axillary lymph nodes with abnormal cortical thickening, the largest said to be 3.4 cm and another adjacent 2.4 cm and several smaller but prominent nodes. There was a cyst in the left breast. Ultrasound-guided core biopsy without marker clip was evaluated by Dr. Gari Crown in pathology. This was read as atypical lymphoid proliferation. He did flow cytometry which showed T-cell with predominant helper phenotype but no monoclonal B-cell population identified. 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    Comorbidities include depression, well-controlled on Wellbutrin. Hypertension. GERD. Morbid obesity. Status post open hiatal hernia repair by Lennie Hummer. Status post umbilical hernia repair by Dr. Rush Farmer. Status post vaginal hysterectomy and left salpingo-oophorectomy.   Family history reveals breast cancer in maternal grandmother. Coronary artery disease and hypertension in  mother and sister.No history of leukemia or lymphoma in family.        Past Medical History   Diagnosis  Date   .  Asthma     .  Hypertension     .  Anxiety     .  Headache(784.0)     .  Personal history of diseases of blood and blood-forming organs     .  Personal history of other diseases of digestive system     .  Esophageal reflux     .  Personal history of other diseases of digestive system     .  Urinary tract infection, site not specified     .  Asymptomatic postmenopausal status (age-related) (natural)     .  Acute sinusitis, unspecified     .  Routine general medical examination at a health care facility     .  Anxiety state, unspecified     .  Pain in joint, lower leg     .  Sprain of thoracic region     .  Herpes simplex without mention of complication     .  Dermatophytosis of nail     .  Family history of malignant neoplasm of  genital organ, other     .  Other postprocedural status(V45.89)     .  Unspecified essential hypertension     .  Headache(784.0)     .  Unspecified asthma(493.90)     .  Barrett esophagus     .  Anemia           Past Surgical History   Procedure  Laterality  Date   .  Cholecystectomy           with hernia repair   .  Abdominal hysterectomy    2006   .  Nissen fundoplication       .  Cervical conization w/bx       .  Tubal ligation       .  Knee surgery    02/2010       Right--arthroscopic         Family History   Problem  Relation  Age of Onset   .  Coronary artery disease           with hernia repair   .  Migraines       .  Hypertension       .  Breast cancer  Maternal Grandmother     .  Cancer  Maternal Grandmother         breast   .  Heart disease  Mother     .  Colon cancer  Neg Hx          Social History History   Substance Use Topics   .  Smoking status:  Never Smoker    .  Smokeless tobacco:  Never Used   .  Alcohol Use:  Yes         Comment: occ varity         Allergies   Allergen  Reactions   .   Codeine         REACTION: SICK-NAUSEATED         Current Outpatient Prescriptions   Medication  Sig  Dispense  Refill   .  ALPRAZolam (XANAX) 0.5 MG tablet  TAKE 1 TABLET BY MOUTH 3 TIMES DAILY AS NEEDED FOR SLEEP OR ANXIETY   60 tablet   0   .  buPROPion (WELLBUTRIN XL) 150 MG 24 hr tablet  TAKE 1 TABLET BY MOUTH DAILY   90 tablet   2   .  FLUoxetine (PROZAC) 40 MG capsule  TAKE 1 CAPSULE BY MOUTH DAILY   30 capsule   4   .  furosemide (LASIX) 20 MG tablet  TAKE 1 TABLET (20 MG TOTAL) BY MOUTH DAILY.   30 tablet   3   .  lisinopril (PRINIVIL,ZESTRIL) 20 MG tablet  TAKE 1 TABLET (20 MG TOTAL) BY MOUTH DAILY.   90 tablet   0   .  omeprazole (PRILOSEC) 10 MG capsule  Take 10 mg by mouth daily.               Review of Systems   Constitutional: Negative for fever, chills and unexpected weight change.  HENT: Negative for congestion, hearing loss, sore throat, trouble swallowing and voice change.   Eyes: Negative for visual disturbance.  Respiratory: Negative for cough and wheezing.   Cardiovascular: Negative for chest pain, palpitations and leg swelling.  Gastrointestinal: Negative for nausea, vomiting, abdominal pain, diarrhea, constipation, blood in stool, abdominal distention and anal bleeding.  Genitourinary: Negative for hematuria, vaginal bleeding and difficulty urinating.  Musculoskeletal: Negative for arthralgias.  Skin: Negative for rash and wound.  Neurological: Negative for seizures, syncope and headaches.  Hematological: Negative for adenopathy. Does not bruise/bleed easily.  Psychiatric/Behavioral: Negative for confusion.      Blood pressure 130/88, pulse 76, temperature 97.6 F (36.4 C), temperature source Temporal, resp. rate 16, height _0  (1.727 m), weight 232 lb 3.2 oz (105.325 kg).   Physical Exam   Constitutional: She is oriented to person, place, and time. She appears well-developed and well-nourished. No distress.  Height 5 feet 8. Weight 232.  HENT:    Head: Normocephalic and atraumatic.   Nose: Nose normal.   Mouth/Throat: No oropharyngeal exudate.  Eyes: Conjunctivae and EOM are normal. Pupils are equal, round, and reactive to light. Left eye exhibits no discharge. No scleral icterus.  Neck: Neck supple. No JVD present. No tracheal deviation present. No thyromegaly present.  Cardiovascular: Normal rate, regular rhythm, normal heart sounds and intact distal pulses.    No murmur heard. Pulmonary/Chest: Effort normal and breath sounds normal. No respiratory distress. She has no wheezes. She has no rales. She exhibits no tenderness.  Breasts are large. No skin change. No nipple discharge. No breast mass. In the left axilla there are some enlarged lymph nodes although these are not dramatically enlarged. There are mobile and nontender. No mass in the right axilla.  Abdominal: Soft. Bowel sounds are normal. She exhibits no distension and no mass. There is no tenderness. There is no rebound and no guarding.  Midline scar from xiphoid to umbilicus.  Genitourinary:  No inguinal adenopathy.  Musculoskeletal: She exhibits no edema and no tenderness.  Lymphadenopathy:    She has no cervical adenopathy.  Neurological: She is alert and oriented to person, place, and time. She exhibits normal muscle tone. Coordination normal.  Skin: Skin is warm. No rash noted. She is not diaphoretic. No erythema. No pallor.  Psychiatric: She has a normal mood and affect. Her behavior is normal. Judgment and thought content normal.      Data Reviewed Imaging studies. Surgical pathology report. Flow cytometry.   Assessment    Left axillary adenopathy. 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 Dr. Alvy Bimler requests port a cath insertion.   Morbid obesity   Hypertension   Depression   GERD   History of open hiatal hernia repair   History vaginal hysterectomy and left salpingo-oophorectomy.      Plan    Schedule for  port a cath insertion.  Discussed with patient by Dr. Alvy Bimler and chemo nurses. I will also discuss in detail preop.          Edsel Petrin. Dalbert Batman, M.D., [facility] General and Minimally invasive Surgery Breast and Colorectal Surgery Office:   [phone] Pager:   [phone]

## 2013-03-21 NOTE — Telephone Encounter (Signed)
Per staff message and POF I have scheduled appts.  JMW  

## 2013-03-21 NOTE — Telephone Encounter (Signed)
Called to update Mr. Levada Dy that the orders have been sent to surgery schedulers to schedule the [facility] insert.  Also updated him that the [employer] paperwork has been filled out but is awaiting a signature from Dr. Dalbert Batman.  Explained that he will be in the office tomorrow so we hope to have it done tomorrow.  He states understanding and agreeable at this time.

## 2013-03-21 NOTE — Progress Notes (Signed)
FMLA paperwork signed/completed by Dr. Alvy Bimler and forwarded to [facility] in managed care dept.Marland Kitchen

## 2013-03-22 ENCOUNTER — Encounter (HOSPITAL_COMMUNITY): Payer: Self-pay | Admitting: *Deleted

## 2013-03-22 MED ORDER — CEFAZOLIN SODIUM-DEXTROSE 2-3 GM-% IV SOLR
2.0000 g | INTRAVENOUS | Status: AC
  Administered 2013-03-23: 2 g via INTRAVENOUS
  Filled 2013-03-22: qty 50

## 2013-03-23 ENCOUNTER — Encounter (HOSPITAL_COMMUNITY)
Admission: RE | Disposition: A | Discharge status: Home / Self Care | Payer: Self-pay | Source: Ambulatory Visit | Attending: General Surgery

## 2013-03-23 ENCOUNTER — Ambulatory Visit (HOSPITAL_COMMUNITY)
Admission: RE | Admit: 2013-03-23 | Discharge: 2013-03-23 | Disposition: A | Discharge status: Home / Self Care | Payer: BC Managed Care – PPO | Source: Ambulatory Visit | Attending: General Surgery | Admitting: General Surgery

## 2013-03-23 ENCOUNTER — Ambulatory Visit (HOSPITAL_COMMUNITY): Payer: BC Managed Care – PPO | Admitting: Anesthesiology

## 2013-03-23 ENCOUNTER — Encounter (HOSPITAL_COMMUNITY): Payer: Self-pay | Admitting: Pharmacy Technician

## 2013-03-23 ENCOUNTER — Encounter (HOSPITAL_COMMUNITY): Payer: BC Managed Care – PPO | Admitting: Anesthesiology

## 2013-03-23 ENCOUNTER — Ambulatory Visit (HOSPITAL_COMMUNITY): Payer: BC Managed Care – PPO

## 2013-03-23 ENCOUNTER — Encounter (HOSPITAL_COMMUNITY): Payer: Self-pay | Admitting: *Deleted

## 2013-03-23 DIAGNOSIS — R443 Hallucinations, unspecified: Secondary | ICD-10-CM | POA: Insufficient documentation

## 2013-03-23 DIAGNOSIS — C8590 Non-Hodgkin lymphoma, unspecified, unspecified site: Secondary | ICD-10-CM

## 2013-03-23 DIAGNOSIS — C8584 Other specified types of non-Hodgkin lymphoma, lymph nodes of axilla and upper limb: Secondary | ICD-10-CM

## 2013-03-23 DIAGNOSIS — I10 Essential (primary) hypertension: Secondary | ICD-10-CM | POA: Insufficient documentation

## 2013-03-23 DIAGNOSIS — IMO0002 Reserved for concepts with insufficient information to code with codable children: Secondary | ICD-10-CM | POA: Insufficient documentation

## 2013-03-23 DIAGNOSIS — F411 Generalized anxiety disorder: Secondary | ICD-10-CM | POA: Insufficient documentation

## 2013-03-23 DIAGNOSIS — J45909 Unspecified asthma, uncomplicated: Secondary | ICD-10-CM | POA: Insufficient documentation

## 2013-03-23 DIAGNOSIS — F3289 Other specified depressive episodes: Secondary | ICD-10-CM | POA: Insufficient documentation

## 2013-03-23 DIAGNOSIS — Z8579 Personal history of other malignant neoplasms of lymphoid, hematopoietic and related tissues: Secondary | ICD-10-CM | POA: Diagnosis present

## 2013-03-23 DIAGNOSIS — K219 Gastro-esophageal reflux disease without esophagitis: Secondary | ICD-10-CM | POA: Insufficient documentation

## 2013-03-23 DIAGNOSIS — F329 Major depressive disorder, single episode, unspecified: Secondary | ICD-10-CM | POA: Insufficient documentation

## 2013-03-23 HISTORY — DX: Major depressive disorder, single episode, unspecified: F32.9

## 2013-03-23 HISTORY — DX: Mental disorder, not otherwise specified: F99

## 2013-03-23 HISTORY — DX: Depression, unspecified: F32.A

## 2013-03-23 HISTORY — PX: PORTACATH PLACEMENT: SHX2246

## 2013-03-23 LAB — BASIC METABOLIC PANEL
BUN: 13 mg/dL (ref 6–23)
CALCIUM: 9.1 mg/dL (ref 8.4–10.5)
CO2: 26 mEq/L (ref 19–32)
Chloride: 101 mEq/L (ref 96–112)
Creatinine, Ser: 0.86 mg/dL (ref 0.50–1.10)
GFR calc non Af Amer: 77 mL/min — ABNORMAL LOW (ref >90)
GFR, EST AFRICAN AMERICAN: 90 mL/min — AB (ref >90)
Glucose, Bld: 91 mg/dL (ref 70–99)
Potassium: 4.2 mEq/L (ref 3.7–5.3)
SODIUM: 140 meq/L (ref 137–147)

## 2013-03-23 LAB — CBC
HCT: 35.7 % — ABNORMAL LOW (ref 36.0–46.0)
Hemoglobin: 11.8 g/dL — ABNORMAL LOW (ref 12.0–15.0)
MCH: 27.9 pg (ref 26.0–34.0)
MCHC: 33.1 g/dL (ref 30.0–36.0)
MCV: 84.4 fL (ref 78.0–100.0)
PLATELETS: 331 10*3/uL (ref 150–400)
RBC: 4.23 MIL/uL (ref 3.87–5.11)
RDW: 13.4 % (ref 11.5–15.5)
WBC: 6.8 10*3/uL (ref 4.0–10.5)

## 2013-03-23 SURGERY — INSERTION, TUNNELED CENTRAL VENOUS DEVICE, WITH PORT
Anesthesia: General | Site: Chest

## 2013-03-23 MED ORDER — HEPARIN SOD (PORK) LOCK FLUSH 100 UNIT/ML IV SOLN
INTRAVENOUS | Status: DC | PRN
  Administered 2013-03-23: 300 [IU] via INTRAVENOUS

## 2013-03-23 MED ORDER — OXYCODONE HCL 5 MG/5ML PO SOLN: 5.0000 mg | Freq: Once | ORAL | Status: DC | PRN

## 2013-03-23 MED ORDER — SUCCINYLCHOLINE CHLORIDE 20 MG/ML IJ SOLN
INTRAMUSCULAR | Status: DC | PRN
  Administered 2013-03-23: 120 mg via INTRAVENOUS

## 2013-03-23 MED ORDER — MIDAZOLAM HCL 5 MG/5ML IJ SOLN
INTRAMUSCULAR | Status: DC | PRN
  Administered 2013-03-23: 2 mg via INTRAVENOUS

## 2013-03-23 MED ORDER — HYDROMORPHONE HCL PF 1 MG/ML IJ SOLN
0.2500 mg | INTRAMUSCULAR | Status: DC | PRN
Start: 2013-03-23 — End: 2013-03-23
  Administered 2013-03-23: 0.25 mg via INTRAVENOUS
  Administered 2013-03-23: 0.5 mg via INTRAVENOUS

## 2013-03-23 MED ORDER — LIDOCAINE-EPINEPHRINE (PF) 1 %-1:200000 IJ SOLN
INTRAMUSCULAR | Status: DC | PRN
  Administered 2013-03-23: 10 mL

## 2013-03-23 MED ORDER — HYDROMORPHONE HCL PF 1 MG/ML IJ SOLN
INTRAMUSCULAR | Status: AC
  Administered 2013-03-23: 0.25 mg via INTRAVENOUS
  Filled 2013-03-23: qty 1

## 2013-03-23 MED ORDER — HYDROCODONE-ACETAMINOPHEN 5-325 MG PO TABS: 1.0000 | ORAL_TABLET | ORAL | Status: DC | PRN

## 2013-03-23 MED ORDER — PROMETHAZINE HCL 25 MG/ML IJ SOLN: 6.2500 mg | INTRAMUSCULAR | Status: DC | PRN

## 2013-03-23 MED ORDER — CHLORHEXIDINE GLUCONATE 4 % EX LIQD: 1.0000 "application " | Freq: Once | CUTANEOUS | Status: DC

## 2013-03-23 MED ORDER — LACTATED RINGERS IV SOLN
INTRAVENOUS | Status: DC | PRN
  Administered 2013-03-23 (×2): via INTRAVENOUS

## 2013-03-23 MED ORDER — PROPOFOL 10 MG/ML IV BOLUS
INTRAVENOUS | Status: DC | PRN
  Administered 2013-03-23: 300 mg via INTRAVENOUS

## 2013-03-23 MED ORDER — LIDOCAINE HCL (CARDIAC) 20 MG/ML IV SOLN
INTRAVENOUS | Status: DC | PRN
  Administered 2013-03-23: 100 mg via INTRAVENOUS

## 2013-03-23 MED ORDER — OXYCODONE HCL 5 MG PO TABS: 5.0000 mg | ORAL_TABLET | Freq: Once | ORAL | Status: DC | PRN

## 2013-03-23 MED ORDER — MIDAZOLAM HCL 2 MG/2ML IJ SOLN
INTRAMUSCULAR | Status: AC
  Filled 2013-03-23: qty 2

## 2013-03-23 MED ORDER — FENTANYL CITRATE 0.05 MG/ML IJ SOLN
INTRAMUSCULAR | Status: AC
  Filled 2013-03-23: qty 5

## 2013-03-23 MED ORDER — ONDANSETRON HCL 4 MG/2ML IJ SOLN
INTRAMUSCULAR | Status: AC
  Filled 2013-03-23: qty 2

## 2013-03-23 MED ORDER — FENTANYL CITRATE 0.05 MG/ML IJ SOLN
INTRAMUSCULAR | Status: DC | PRN
  Administered 2013-03-23: 100 ug via INTRAVENOUS

## 2013-03-23 MED ORDER — LACTATED RINGERS IV SOLN
INTRAVENOUS | Status: DC
  Administered 2013-03-23: 11:00:00 via INTRAVENOUS

## 2013-03-23 MED ORDER — ONDANSETRON HCL 4 MG/2ML IJ SOLN
INTRAMUSCULAR | Status: DC | PRN
  Administered 2013-03-23: 4 mg via INTRAVENOUS

## 2013-03-23 MED ORDER — SODIUM CHLORIDE 0.9 % IR SOLN
Status: DC | PRN
  Administered 2013-03-23: 12:00:00

## 2013-03-23 MED ORDER — HEPARIN SOD (PORK) LOCK FLUSH 100 UNIT/ML IV SOLN
INTRAVENOUS | Status: AC
  Filled 2013-03-23: qty 5

## 2013-03-23 MED ORDER — METOCLOPRAMIDE HCL 5 MG/ML IJ SOLN
INTRAMUSCULAR | Status: AC
  Filled 2013-03-23: qty 2

## 2013-03-23 SURGICAL SUPPLY — 55 items
BAG DECANTER FOR FLEXI CONT (MISCELLANEOUS) ×3 IMPLANT
BLADE SURG 10 STRL SS (BLADE) ×3 IMPLANT
BLADE SURG 15 STRL LF DISP TIS (BLADE) ×1 IMPLANT
BLADE SURG 15 STRL SS (BLADE) ×2
BLADE SURG ROTATE 9660 (MISCELLANEOUS) IMPLANT
CANISTER SUCTION 2500CC (MISCELLANEOUS) IMPLANT
CHLORAPREP W/TINT 10.5 ML (MISCELLANEOUS) ×3 IMPLANT
COVER PROBE W GEL 5X96 (DRAPES) IMPLANT
COVER SURGICAL LIGHT HANDLE (MISCELLANEOUS) ×3 IMPLANT
CRADLE DONUT ADULT HEAD (MISCELLANEOUS) ×3 IMPLANT
DECANTER SPIKE VIAL GLASS SM (MISCELLANEOUS) ×3 IMPLANT
DERMABOND ADHESIVE PROPEN (GAUZE/BANDAGES/DRESSINGS) ×2
DERMABOND ADVANCED (GAUZE/BANDAGES/DRESSINGS) ×2
DERMABOND ADVANCED .7 DNX12 (GAUZE/BANDAGES/DRESSINGS) ×1 IMPLANT
DERMABOND ADVANCED .7 DNX6 (GAUZE/BANDAGES/DRESSINGS) ×1 IMPLANT
DRAPE C-ARM 42X72 X-RAY (DRAPES) ×3 IMPLANT
DRAPE LAPAROSCOPIC ABDOMINAL (DRAPES) ×3 IMPLANT
DRAPE UTILITY 15X26 W/TAPE STR (DRAPE) ×6 IMPLANT
ELECT CAUTERY BLADE 6.4 (BLADE) ×3 IMPLANT
ELECT REM PT RETURN 9FT ADLT (ELECTROSURGICAL) ×3
ELECTRODE REM PT RTRN 9FT ADLT (ELECTROSURGICAL) ×1 IMPLANT
GAUZE SPONGE 4X4 16PLY XRAY LF (GAUZE/BANDAGES/DRESSINGS) ×3 IMPLANT
GLOVE EUDERMIC 7 POWDERFREE (GLOVE) ×3 IMPLANT
GOWN STRL NON-REIN LRG LVL3 (GOWN DISPOSABLE) ×3 IMPLANT
GOWN STRL REIN XL XLG (GOWN DISPOSABLE) ×3 IMPLANT
INTRODUCER 13FR (MISCELLANEOUS) IMPLANT
INTRODUCER COOK 11FR (CATHETERS) IMPLANT
KIT BASIN OR (CUSTOM PROCEDURE TRAY) ×3 IMPLANT
KIT PORT POWER 8FR ISP CVUE (Catheter) ×3 IMPLANT
KIT PORT POWER 9.6FR MRI PREA (Catheter) IMPLANT
KIT PORT POWER ISP 8FR (Catheter) IMPLANT
KIT POWER CATH 8FR (Catheter) IMPLANT
KIT ROOM TURNOVER OR (KITS) ×3 IMPLANT
NEEDLE HYPO 25GX1X1/2 BEV (NEEDLE) ×6 IMPLANT
NS IRRIG 1000ML POUR BTL (IV SOLUTION) ×3 IMPLANT
PACK SURGICAL SETUP 50X90 (CUSTOM PROCEDURE TRAY) ×3 IMPLANT
PAD ARMBOARD 7.5X6 YLW CONV (MISCELLANEOUS) ×3 IMPLANT
PENCIL BUTTON HOLSTER BLD 10FT (ELECTRODE) ×3 IMPLANT
SET INTRODUCER 12FR PACEMAKER (SHEATH) IMPLANT
SET SHEATH INTRODUCER 10FR (MISCELLANEOUS) IMPLANT
SHEATH COOK PEEL AWAY SET 9F (SHEATH) IMPLANT
SURGILUBE 3G PEEL PACK STRL (MISCELLANEOUS) IMPLANT
SUT MNCRL AB 4-0 PS2 18 (SUTURE) ×3 IMPLANT
SUT PROLENE 2 0 CT2 30 (SUTURE) ×3 IMPLANT
SUT VIC AB 3-0 SH 18 (SUTURE) ×3 IMPLANT
SYR 20ML ECCENTRIC (SYRINGE) ×6 IMPLANT
SYR 5ML LUER SLIP (SYRINGE) ×3 IMPLANT
SYR BULB 3OZ (MISCELLANEOUS) ×3 IMPLANT
SYR CONTROL 10ML LL (SYRINGE) ×3 IMPLANT
SYRINGE 10CC LL (SYRINGE) ×3 IMPLANT
TOWEL OR 17X24 6PK STRL BLUE (TOWEL DISPOSABLE) ×6 IMPLANT
TOWEL OR 17X26 10 PK STRL BLUE (TOWEL DISPOSABLE) ×3 IMPLANT
TUBE CONNECTING 12'X1/4 (SUCTIONS)
TUBE CONNECTING 12X1/4 (SUCTIONS) IMPLANT
YANKAUER SUCT BULB TIP NO VENT (SUCTIONS) IMPLANT

## 2013-03-23 NOTE — Transfer of Care (Signed)
Immediate Anesthesia Transfer of Care Note  Patient: Anna Jackson  Procedure(s) Performed: Procedure(s): INSERTION PORT-A-CATH (N/A)  Patient Location: PACU  Anesthesia Type:General  Level of Consciousness: awake, alert  and oriented  Airway & Oxygen Therapy: Patient Spontanous Breathing and Patient connected to face mask oxygen  Post-op Assessment: Report given to PACU RN, Post -op Vital signs reviewed and stable and Patient moving all extremities X 4  Post vital signs: Reviewed and stable  Complications: No apparent anesthesia complications

## 2013-03-23 NOTE — Preoperative (Signed)
Beta Blockers   Reason not to administer Beta Blockers:Not Applicable 

## 2013-03-23 NOTE — Anesthesia Procedure Notes (Signed)
Procedure Name: Intubation Date/Time: 03/23/2013 11:49 AM Performed by: Neldon Newport Pre-anesthesia Checklist: Patient identified, Timeout performed, Emergency Drugs available, Suction available and Patient being monitored Patient Re-evaluated:Patient Re-evaluated prior to inductionOxygen Delivery Method: Circle system utilized Preoxygenation: Pre-oxygenation with 100% oxygen Intubation Type: IV induction and Rapid sequence Ventilation: Mask ventilation without difficulty Laryngoscope Size: Mac and 3 (massiter muscle rigidity on induction with propofol.  Anectine used to break) Grade View: Grade I Tube type: Oral Tube size: 7.5 mm Number of attempts: 2 Placement Confirmation: positive ETCO2,  ETT inserted through vocal cords under direct vision and breath sounds checked- equal and bilateral Secured at: 22 cm Tube secured with: Tape Dental Injury: Teeth and Oropharynx as per pre-operative assessment

## 2013-03-23 NOTE — Op Note (Signed)
Patient Name:           Anna Jackson   Date of Surgery:        03/23/2013   Pre op Diagnosis:    Left axillary anaplastic large cell lymphoma, ALK positive. Marland Kitchen    Post op Diagnosis:    same Procedure:                  Insertion of 8 French ClearView power port tunneled vascular access device with fluoroscopic guidance and positioning  Surgeon:                     Edsel Petrin. Dalbert Batman, M.D., FACS  Assistant:                      none  Operative Indications:   Anna Jackson is a 53 y.o. female. . Dr. Garnet Koyanagi is her PCP. Dr. Alvy Bimler is her oncologist.  About a month ago she felt a lump in her left axilla. Mammograms were basically normal, but diagnostic left mammogram and ultrasound left breast showed multiple enlarged left axillary lymph nodes with abnormal cortical thickening, the largest said to be 3.4 cm and another adjacent 2.4 cm and several smaller but prominent nodes. There was a cyst in the left breast. Ultrasound-guided core biopsy without marker clip was evaluated by Dr. Gari Crown in pathology. This was read as atypical lymphoid proliferation. He did flow cytometry which showed T-cell with predominant helper phenotype but no monoclonal B-cell population identified. 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 Dr. Alvy Bimler has evaluated the patient and requested a port be placed to facilitate primary chemotherapy Comorbidities include depression, well-controlled on Wellbutrin. Hypertension. GERD. Morbid obesity. Status post open hiatal hernia repair. Status post umbilical hernia repair. Status post vaginal hysterectomy and left salpingo-oophorectomy.  Family history reveals breast cancer in maternal grandmother. Coronary artery disease and hypertension in mother and sister.No history of leukemia or lymphoma in family.   Operative Findings:       We placed the catheter through the right subclavian vein and positioned the tip in the superior vena  cava near the right atrial junction. The catheter flushed well and had excellent blood return. Intraoperative fluoroscopy showed good positioning of the catheter.  Procedure in Detail:          Following the induction of general endotracheal anesthesia the patient was positioned with a small roll behind her shoulders and her arms at her sides. The neck and chest were prepped and draped in a sterile fashion. Intravenous antibiotics were given. Surgical time out was performed. 0.5% Marcaine with epinephrine was used as a local infiltration anesthetic.      A right subclavian venipuncture was performed without difficulty, a guidewire was inserted into the central venous circulation. The wire appeared to be in the superior vena cava under fluoroscopic guidance. A small incision was made at the wire insertion site. Using the fluoroscope I marked a template on the chest wall to guide insertion and positioning of the catheter so the tip would be in the superior vena cava at the right atrial junction. A transverse incision was made in the right parasternal area. Some of the subcutaneous fat was debrided. The subcutaneous pocket was created at the level of the pectoralis fascia. Using a tunneling device I passed the catheter from the wire insertion site to the port pocket site. Using a fluoroscopically created template on the chest wall  I measured the catheter length and found that it needed to be 23 cm in length. The catheter was secured to the port with the locking device and the porta catheter were flushed with heparinized saline. I placed the port in the port pocket. The dilator and peel-away sheath were inserted  over the guidewire into the central venous circulation. The dilator and guidewire were removed. The catheter was threaded into the peel-away sheath and the peel-away sheath removed. The catheter flushed easily and had excellent blood return. Fluoroscopy confirmed that the tip of the catheter was in the SVC   near the right atrial junction. There is no deformity or kinking of the catheter anywhere along its course. I flushed the catheter and port with concentrated heparin. The port was sutured to the pectoralis fascia with 3 interrupted sutures of 2-0 Prolene. There was no bleeding. Subcutaneous tissues were closed with 0 Vicryl sutures and the skin incisions were closed with subcuticular stitches of 4-0 Monocryl and Dermabond. The patient was taken to recovery room where a chest x-ray will be taken. EBL 10 cc. Counts correct. Complications none.     Edsel Petrin. Dalbert Batman, M.D., FACS General and Minimally Invasive Surgery Breast and Colorectal Surgery  03/23/2013 12:39 PM

## 2013-03-23 NOTE — Interval H&P Note (Signed)
History and Physical Interval Note:  03/23/2013 11:11 AM  [name]  has presented today for surgery, with the diagnosis of lymphoma  The goals and the various methods of treatment have been discussed with the patient and family. After consideration of risks, benefits and other options for treatment, the patient has consented to  Procedure(s): INSERTION PORT-A-CATH (N/A) as a surgical intervention .  The patient's history has been reviewed, patient examined today, no change in status, stable for surgery.  I have reviewed the patient's chart and labs.  Questions were answered to the patient's satisfaction.     [name]

## 2013-03-23 NOTE — Anesthesia Preprocedure Evaluation (Addendum)
Anesthesia Evaluation  Patient identified by MRN, date of birth, ID band Patient awake    Reviewed: Allergy & Precautions, H&P , NPO status , Patient's Chart, lab work & pertinent test results  Airway Mallampati: II TM Distance: >3 FB Neck ROM: Full    Dental  (+) Teeth Intact and Dental Advisory Given   Pulmonary asthma ,  breath sounds clear to auscultation  Pulmonary exam normal       Cardiovascular hypertension, Pt. on medications Rhythm:Regular Rate:Normal     Neuro/Psych negative neurological ROS     GI/Hepatic Neg liver ROS, GERD-  Medicated,  Endo/Other  Morbid obesity  Renal/GU      Musculoskeletal negative musculoskeletal ROS (+)   Abdominal   Peds  Hematology  (+) Blood dyscrasia, anemia ,   Anesthesia Other Findings   Reproductive/Obstetrics                         Anesthesia Physical Anesthesia Plan  ASA: II  Anesthesia Plan: General   Post-op Pain Management:    Induction: Intravenous  Airway Management Planned: LMA  Additional Equipment:   Intra-op Plan:   Post-operative Plan: Extubation in OR  Informed Consent: I have reviewed the patients History and Physical, chart, labs and discussed the procedure including the risks, benefits and alternatives for the proposed anesthesia with the patient or authorized representative who has indicated his/her understanding and acceptance.   Dental advisory given and Dental Advisory Given  Plan Discussed with: CRNA, Surgeon and Anesthesiologist  Anesthesia Plan Comments:        Anesthesia Quick Evaluation

## 2013-03-23 NOTE — Anesthesia Postprocedure Evaluation (Signed)
  Anesthesia Post-op Note  Patient: Anna Jackson  Procedure(s) Performed: Procedure(s): INSERTION PORT-A-CATH (N/A)  Patient Location: PACU  Anesthesia Type:General  Level of Consciousness: awake  Airway and Oxygen Therapy: Patient Spontanous Breathing  Post-op Pain: mild  Post-op Assessment: Post-op Vital signs reviewed  Post-op Vital Signs: stable  Complications: No apparent anesthesia complications

## 2013-03-26 ENCOUNTER — Encounter (HOSPITAL_COMMUNITY): Payer: Self-pay | Admitting: General Surgery

## 2013-03-26 ENCOUNTER — Ambulatory Visit (HOSPITAL_COMMUNITY)
Admission: RE | Admit: 2013-03-26 | Discharge: 2013-03-26 | Disposition: A | Discharge status: Home / Self Care | Payer: BC Managed Care – PPO | Source: Ambulatory Visit | Attending: Hematology and Oncology | Admitting: Hematology and Oncology

## 2013-03-26 ENCOUNTER — Other Ambulatory Visit: Payer: Self-pay | Admitting: Family Medicine

## 2013-03-26 DIAGNOSIS — I517 Cardiomegaly: Secondary | ICD-10-CM

## 2013-03-26 DIAGNOSIS — C8589 Other specified types of non-Hodgkin lymphoma, extranodal and solid organ sites: Secondary | ICD-10-CM | POA: Insufficient documentation

## 2013-03-26 DIAGNOSIS — C8590 Non-Hodgkin lymphoma, unspecified, unspecified site: Secondary | ICD-10-CM

## 2013-03-26 DIAGNOSIS — M7989 Other specified soft tissue disorders: Secondary | ICD-10-CM | POA: Insufficient documentation

## 2013-03-26 DIAGNOSIS — I10 Essential (primary) hypertension: Secondary | ICD-10-CM | POA: Insufficient documentation

## 2013-03-26 DIAGNOSIS — Z8249 Family history of ischemic heart disease and other diseases of the circulatory system: Secondary | ICD-10-CM | POA: Insufficient documentation

## 2013-03-26 NOTE — Progress Notes (Signed)
  Echocardiogram 2D Echocardiogram has been performed.  Anna Jackson 03/26/2013, 10:47 AM

## 2013-03-27 ENCOUNTER — Encounter (HOSPITAL_COMMUNITY): Payer: Self-pay | Admitting: Pharmacy Technician

## 2013-03-27 ENCOUNTER — Other Ambulatory Visit: Payer: Self-pay | Admitting: Radiology

## 2013-03-29 ENCOUNTER — Ambulatory Visit (HOSPITAL_COMMUNITY)
Admission: RE | Admit: 2013-03-29 | Discharge: 2013-03-29 | Disposition: A | Discharge status: Home / Self Care | Payer: BC Managed Care – PPO | Source: Ambulatory Visit | Attending: Hematology and Oncology | Admitting: Hematology and Oncology

## 2013-03-29 ENCOUNTER — Encounter (HOSPITAL_COMMUNITY): Payer: Self-pay

## 2013-03-29 ENCOUNTER — Encounter (INDEPENDENT_AMBULATORY_CARE_PROVIDER_SITE_OTHER): Payer: BC Managed Care – PPO | Admitting: General Surgery

## 2013-03-29 DIAGNOSIS — Z9089 Acquired absence of other organs: Secondary | ICD-10-CM | POA: Insufficient documentation

## 2013-03-29 DIAGNOSIS — F3289 Other specified depressive episodes: Secondary | ICD-10-CM | POA: Insufficient documentation

## 2013-03-29 DIAGNOSIS — I10 Essential (primary) hypertension: Secondary | ICD-10-CM | POA: Diagnosis not present

## 2013-03-29 DIAGNOSIS — C8590 Non-Hodgkin lymphoma, unspecified, unspecified site: Secondary | ICD-10-CM

## 2013-03-29 DIAGNOSIS — Z79899 Other long term (current) drug therapy: Secondary | ICD-10-CM | POA: Insufficient documentation

## 2013-03-29 DIAGNOSIS — K219 Gastro-esophageal reflux disease without esophagitis: Secondary | ICD-10-CM | POA: Insufficient documentation

## 2013-03-29 DIAGNOSIS — F329 Major depressive disorder, single episode, unspecified: Secondary | ICD-10-CM | POA: Insufficient documentation

## 2013-03-29 DIAGNOSIS — D649 Anemia, unspecified: Secondary | ICD-10-CM | POA: Diagnosis not present

## 2013-03-29 DIAGNOSIS — F411 Generalized anxiety disorder: Secondary | ICD-10-CM | POA: Insufficient documentation

## 2013-03-29 DIAGNOSIS — C8409 Mycosis fungoides, extranodal and solid organ sites: Secondary | ICD-10-CM | POA: Diagnosis present

## 2013-03-29 LAB — CBC
HEMATOCRIT: 35.6 % — AB (ref 36.0–46.0)
Hemoglobin: 11.6 g/dL — ABNORMAL LOW (ref 12.0–15.0)
MCH: 27.4 pg (ref 26.0–34.0)
MCHC: 32.6 g/dL (ref 30.0–36.0)
MCV: 84 fL (ref 78.0–100.0)
PLATELETS: 320 10*3/uL (ref 150–400)
RBC: 4.24 MIL/uL (ref 3.87–5.11)
RDW: 13.3 % (ref 11.5–15.5)
WBC: 7.8 10*3/uL (ref 4.0–10.5)

## 2013-03-29 LAB — BONE MARROW EXAM: Bone Marrow Exam: 113

## 2013-03-29 LAB — PROTIME-INR
INR: 0.99 (ref 0.00–1.49)
PROTHROMBIN TIME: 12.9 s (ref 11.6–15.2)

## 2013-03-29 LAB — APTT: APTT: 33 s (ref 24–37)

## 2013-03-29 MED ORDER — MIDAZOLAM HCL 2 MG/2ML IJ SOLN
INTRAMUSCULAR | Status: AC | PRN
  Administered 2013-03-29: 1 mg via INTRAVENOUS
  Administered 2013-03-29: 2 mg via INTRAVENOUS

## 2013-03-29 MED ORDER — HYDROCODONE-ACETAMINOPHEN 5-325 MG PO TABS
1.0000 | ORAL_TABLET | ORAL | Status: DC | PRN
  Filled 2013-03-29: qty 2

## 2013-03-29 MED ORDER — FENTANYL CITRATE 0.05 MG/ML IJ SOLN
INTRAMUSCULAR | Status: AC | PRN
  Administered 2013-03-29: 100 ug via INTRAVENOUS

## 2013-03-29 MED ORDER — SODIUM CHLORIDE 0.9 % IV SOLN: INTRAVENOUS | Status: DC

## 2013-03-29 MED ORDER — MIDAZOLAM HCL 2 MG/2ML IJ SOLN
INTRAMUSCULAR | Status: AC
  Filled 2013-03-29: qty 4

## 2013-03-29 MED ORDER — FENTANYL CITRATE 0.05 MG/ML IJ SOLN
INTRAMUSCULAR | Status: AC
  Filled 2013-03-29: qty 4

## 2013-03-29 NOTE — H&P (Signed)
Anna Jackson is an 53 y.o. female.   Chief Complaint: "I'm having a bone marrow biopsy" HPI: Patient with history of recently diagnosed anaplastic T cell lymphoma presents today for staging CT guided bone marrow biopsy.                      Past Medical History  Diagnosis Date  . Hypertension   . Anxiety   . Esophageal reflux   . Anxiety state, unspecified   . Herpes simplex without mention of complication   . Unspecified essential hypertension   . Barrett esophagus   . Anemia   . Asthma     no problem with asthma in several years  . Unspecified asthma(493.90)   . Adenopathy     left axillary  . 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 Mental disorder   . Depression     Past Surgical History  Procedure Laterality Date  . Cholecystectomy      with hernia repair  . Abdominal hysterectomy  2006  . Nissen fundoplication    . Cervical conization w/bx    . Tubal ligation    . Knee surgery  02/2010    Right--arthroscopic  . Axillary lymph node dissection Left 03/09/2013    Procedure: EXCISION LEFT AXILLARY LYMPH NODES;  Surgeon: [name], MD;  Location: [facility];  Service: General;  Laterality: Left;  . Portacath placement N/A 03/23/2013    Procedure: INSERTION PORT-A-CATH;  Surgeon: [name], MD;  Location: [employer];  Service: General;  Laterality: N/A;    Family History  Problem Relation Age of Onset  . Coronary artery disease      with hernia repair  . Migraines    . Hypertension    . Breast cancer Maternal Grandmother   . Cancer Maternal Grandmother 66    breast  . Heart disease Mother   . Colon cancer Neg Hx    Social History:  reports that she has never smoked. She has never used smokeless tobacco. She reports that she drinks alcohol. She reports that she does not use illicit drugs.  Allergies:  Allergies  Allergen Reactions  . Betadine [Povidone Iodine] Hives and Itching  . Codeine     REACTION: SICK-NAUSEATED    Current outpatient  prescriptions:buPROPion (WELLBUTRIN XL) 150 MG 24 hr tablet, Take 150 mg by mouth every morning., Disp: , Rfl: ;  docusate sodium (COLACE) 100 MG capsule, Take 100 mg by mouth daily., Disp: , Rfl: ;  FLUoxetine (PROZAC) 40 MG capsule, Take 40 mg by mouth every morning., Disp: , Rfl: ;  furosemide (LASIX) 20 MG tablet, Take 20 mg by mouth every morning., Disp: , Rfl:  lisinopril (PRINIVIL,ZESTRIL) 20 MG tablet, TAKE 1 TABLET (20 MG TOTAL) BY MOUTH DAILY., Disp: 90 tablet, Rfl: 1;  lisinopril-hydrochlorothiazide (PRINZIDE,ZESTORETIC) 20-25 MG per tablet, Take 1 tablet by mouth every morning., Disp: , Rfl: ;  omeprazole (PRILOSEC OTC) 20 MG tablet, Take 20 mg by mouth daily., Disp: , Rfl:  Current facility-administered medications:0.9 %  sodium chloride infusion, , Intravenous, Continuous, D Rowe Robert, PA-C Results for orders placed during the hospital encounter of 03/29/13  CBC      Result Value Ref Range   WBC 7.8  4.0 - 10.5 K/uL   RBC 4.24  3.87 - 5.11 MIL/uL   Hemoglobin 11.6 (*) 12.0 - 15.0 g/dL   HCT 35.6 (*) 36.0 - 46.0 %   MCV 84.0  78.0 - 100.0 fL  MCH 27.4  26.0 - 34.0 pg   MCHC 32.6  30.0 - 36.0 g/dL   RDW 13.3  11.5 - 15.5 %   Platelets 320  150 - 400 K/uL    Review of Systems  Constitutional: Negative for fever and chills.  Respiratory: Negative for cough and shortness of breath.   Cardiovascular: Negative for chest pain.  Gastrointestinal: Negative for nausea, vomiting and abdominal pain.  Musculoskeletal: Negative for back pain.  Neurological: Negative for headaches.  Endo/Heme/Allergies: Does not bruise/bleed easily.    Blood pressure 122/88, pulse 81, temperature 98.2 F (36.8 C), temperature source Oral, resp. rate 16, SpO2 100.00%. Physical Exam  Constitutional: She is oriented to person, place, and time. She appears well-developed and well-nourished.  Cardiovascular: Normal rate and regular rhythm.   Respiratory: Effort normal and breath sounds normal.  GI:  Soft. Bowel sounds are normal. There is no tenderness.  Musculoskeletal: Normal range of motion. She exhibits no edema.  Neurological: She is alert and oriented to person, place, and time.     Assessment/Plan Pt with hx of anaplastic T cell lymphoma. Plan is for staging CT guided bone marrow biopsy today. Details/risks of procedure d/w pt with her understanding and consent.  Anish Vana,D KEVIN 03/29/2013, 7:54 AM

## 2013-03-29 NOTE — Discharge Instructions (Signed)
Bone Marrow Aspiration, Bone Marrow Biopsy Care After Read the instructions outlined below and refer to this sheet in the next few weeks. These discharge instructions provide you with general information on caring for yourself after you leave the hospital. Your caregiver may also give you specific instructions. While your treatment has been planned according to the most current medical practices available, unavoidable complications occasionally occur. If you have any problems or questions after discharge, call your caregiver. FINDING OUT THE RESULTS OF YOUR TEST Not all test results are available during your visit. If your test results are not back during the visit, make an appointment with your caregiver to find out the results. Do not assume everything is normal if you have not heard from your caregiver or the medical facility. It is important for you to follow up on all of your test results.  HOME CARE INSTRUCTIONS  You have had sedation and may be sleepy or dizzy. Your thinking may not be as clear as usual. For the next 24 hours:  Only take over-the-counter or prescription medicines for pain, discomfort, and or fever as directed by your caregiver.  Do not drink alcohol.  Do not smoke.  Do not drive.  Do not make important legal decisions.  Do not operate heavy machinery.  Do not care for small children by yourself.  Keep your dressing clean and dry. You may replace dressing with a bandage after 24 hours.  You may take a bath or shower after 24 hours.  Use an ice pack for 20 minutes every 2 hours while awake for pain as needed. SEEK MEDICAL CARE IF:   There is redness, swelling, or increasing pain at the biopsy site.  There is pus coming from the biopsy site.  There is drainage from a biopsy site lasting longer than one day.  An unexplained oral temperature above 102 F (38.9 C) develops. SEEK IMMEDIATE MEDICAL CARE IF:   You develop a rash.  You have difficulty  breathing.  You develop any reaction or side effects to medications given. Document Released: 08/21/2004 Document Revised: 04/26/2011 Document Reviewed: 01/30/2008 [facility] Patient Information 2014 [place]. Conscious Sedation, Adult, Care After Refer to this sheet in the next few weeks. These instructions provide you with information on caring for yourself after your procedure. Your health care provider may also give you more specific instructions. Your treatment has been planned according to current medical practices, but problems sometimes occur. Call your health care provider if you have any problems or questions after your procedure. WHAT TO EXPECT AFTER THE PROCEDURE  After your procedure:  You may feel sleepy, clumsy, and have poor balance for several hours.  Vomiting may occur if you eat too soon after the procedure. HOME CARE INSTRUCTIONS  Do not participate in any activities where you could become injured for at least 24 hours. Do not:  Drive.  Swim.  Ride a bicycle.  Operate heavy machinery.  Cook.  Use power tools.  Climb ladders.  Work from a high place.  Do not make important decisions or sign legal documents until you are improved.  If you vomit, drink water, juice, or soup when you can drink without vomiting. Make sure you have little or no nausea before eating solid foods.  Only take over-the-counter or prescription medicines for pain, discomfort, or fever as directed by your health care provider.  Make sure you and your family fully understand everything about the medicines given to you, including what side effects may occur.  You should not drink alcohol, take sleeping pills, or take medicines that cause drowsiness for at least 24 hours.  If you smoke, do not smoke without supervision.  If you are feeling better, you may resume normal activities 24 hours after you were sedated.  Keep all appointments with your health care provider. SEEK  MEDICAL CARE IF:  Your skin is pale or bluish in color.  You continue to feel nauseous or vomit.  Your pain is getting worse and is not helped by medicine.  You have bleeding or swelling.  You are still sleepy or feeling clumsy after 24 hours. SEEK IMMEDIATE MEDICAL CARE IF:  You develop a rash.  You have difficulty breathing.  You develop any type of allergic problem.  You have a fever. MAKE SURE YOU:  Understand these instructions.  Will watch your condition.  Will get help right away if you are not doing well or get worse. Document Released: 11/22/2012 Document Reviewed: 09/08/2012 [facility] Patient Information 2014 [place], [place].

## 2013-03-29 NOTE — Procedures (Signed)
CT-guided  R iliac bone marrow aspiration and core biopsy No complication No blood loss. See complete dictation in [facility] PACS  

## 2013-04-02 ENCOUNTER — Encounter (HOSPITAL_COMMUNITY): Payer: Self-pay

## 2013-04-02 ENCOUNTER — Ambulatory Visit (HOSPITAL_COMMUNITY)
Admission: RE | Admit: 2013-04-02 | Discharge: 2013-04-02 | Disposition: A | Discharge status: Home / Self Care | Payer: BC Managed Care – PPO | Source: Ambulatory Visit | Attending: Hematology and Oncology | Admitting: Hematology and Oncology

## 2013-04-02 DIAGNOSIS — K449 Diaphragmatic hernia without obstruction or gangrene: Secondary | ICD-10-CM | POA: Insufficient documentation

## 2013-04-02 DIAGNOSIS — M47817 Spondylosis without myelopathy or radiculopathy, lumbosacral region: Secondary | ICD-10-CM | POA: Insufficient documentation

## 2013-04-02 DIAGNOSIS — Z9089 Acquired absence of other organs: Secondary | ICD-10-CM | POA: Insufficient documentation

## 2013-04-02 DIAGNOSIS — C8590 Non-Hodgkin lymphoma, unspecified, unspecified site: Secondary | ICD-10-CM

## 2013-04-02 DIAGNOSIS — C8409 Mycosis fungoides, extranodal and solid organ sites: Secondary | ICD-10-CM | POA: Insufficient documentation

## 2013-04-02 LAB — GLUCOSE, CAPILLARY: Glucose-Capillary: 87 mg/dL (ref 70–99)

## 2013-04-02 MED ORDER — FLUDEOXYGLUCOSE F - 18 (FDG) INJECTION
11.9000 | Freq: Once | INTRAVENOUS | Status: AC | PRN
  Administered 2013-04-02: 11.9 via INTRAVENOUS

## 2013-04-03 ENCOUNTER — Encounter: Payer: Self-pay | Admitting: Hematology and Oncology

## 2013-04-03 ENCOUNTER — Other Ambulatory Visit: Payer: BC Managed Care – PPO

## 2013-04-03 ENCOUNTER — Encounter: Payer: Self-pay | Admitting: *Deleted

## 2013-04-03 ENCOUNTER — Other Ambulatory Visit (HOSPITAL_BASED_OUTPATIENT_CLINIC_OR_DEPARTMENT_OTHER): Payer: BC Managed Care – PPO

## 2013-04-03 ENCOUNTER — Ambulatory Visit (HOSPITAL_BASED_OUTPATIENT_CLINIC_OR_DEPARTMENT_OTHER): Payer: BC Managed Care – PPO | Admitting: Hematology and Oncology

## 2013-04-03 ENCOUNTER — Telehealth: Payer: Self-pay | Admitting: Hematology and Oncology

## 2013-04-03 VITALS — BP 131/83 | HR 76 | Temp 98.0°F | Resp 18 | Ht 68.0 in | Wt 229.3 lb

## 2013-04-03 DIAGNOSIS — IMO0002 Reserved for concepts with insufficient information to code with codable children: Secondary | ICD-10-CM

## 2013-04-03 DIAGNOSIS — C8590 Non-Hodgkin lymphoma, unspecified, unspecified site: Secondary | ICD-10-CM

## 2013-04-03 LAB — COMPREHENSIVE METABOLIC PANEL
ALK PHOS: 164 U/L — AB (ref 39–117)
ALT: 18 U/L (ref 0–35)
AST: 16 U/L (ref 0–37)
Albumin: 3.9 g/dL (ref 3.5–5.2)
BILIRUBIN TOTAL: 0.5 mg/dL (ref 0.2–1.2)
BUN: 13 mg/dL (ref 6–23)
CO2: 25 meq/L (ref 19–32)
Calcium: 9.1 mg/dL (ref 8.4–10.5)
Chloride: 100 mEq/L (ref 96–112)
Creatinine, Ser: 0.87 mg/dL (ref 0.50–1.10)
GLUCOSE: 85 mg/dL (ref 70–99)
Potassium: 4.1 mEq/L (ref 3.5–5.3)
Sodium: 140 mEq/L (ref 135–145)
TOTAL PROTEIN: 6.2 g/dL (ref 6.0–8.3)

## 2013-04-03 LAB — CBC WITH DIFFERENTIAL/PLATELET
BASO%: 0.9 % (ref 0.0–2.0)
BASOS ABS: 0.1 10*3/uL (ref 0.0–0.1)
EOS ABS: 0.3 10*3/uL (ref 0.0–0.5)
EOS%: 2.8 % (ref 0.0–7.0)
HEMATOCRIT: 37.2 % (ref 34.8–46.6)
HEMOGLOBIN: 12.3 g/dL (ref 11.6–15.9)
LYMPH%: 17.4 % (ref 14.0–49.7)
MCH: 27.7 pg (ref 25.1–34.0)
MCHC: 33 g/dL (ref 31.5–36.0)
MCV: 83.7 fL (ref 79.5–101.0)
MONO#: 0.7 10*3/uL (ref 0.1–0.9)
MONO%: 7.7 % (ref 0.0–14.0)
NEUT#: 6.8 10*3/uL — ABNORMAL HIGH (ref 1.5–6.5)
NEUT%: 71.2 % (ref 38.4–76.8)
PLATELETS: 378 10*3/uL (ref 145–400)
RBC: 4.44 10*6/uL (ref 3.70–5.45)
RDW: 13.9 % (ref 11.2–14.5)
WBC: 9.6 10*3/uL (ref 3.9–10.3)
lymph#: 1.7 10*3/uL (ref 0.9–3.3)

## 2013-04-03 LAB — LACTATE DEHYDROGENASE: LDH: 149 U/L (ref 94–250)

## 2013-04-03 LAB — URIC ACID: URIC ACID, SERUM: 3.7 mg/dL (ref 2.4–7.0)

## 2013-04-03 MED ORDER — ALLOPURINOL 300 MG PO TABS: 300.0000 mg | ORAL_TABLET | Freq: Every day | ORAL | Status: DC

## 2013-04-03 MED ORDER — LIDOCAINE-PRILOCAINE 2.5-2.5 % EX CREA: 1.0000 "application " | TOPICAL_CREAM | CUTANEOUS | Status: DC | PRN

## 2013-04-03 MED ORDER — PROCHLORPERAZINE MALEATE 10 MG PO TABS: 10.0000 mg | ORAL_TABLET | Freq: Four times a day (QID) | ORAL | Status: DC | PRN

## 2013-04-03 MED ORDER — ONDANSETRON HCL 8 MG PO TABS: 8.0000 mg | ORAL_TABLET | Freq: Two times a day (BID) | ORAL | Status: DC

## 2013-04-03 MED ORDER — PREDNISONE 20 MG PO TABS: 60.0000 mg | ORAL_TABLET | Freq: Every day | ORAL | Status: DC

## 2013-04-03 NOTE — Progress Notes (Signed)
[place] OFFICE PROGRESS NOTE  Patient Care Team: Rosalita Chessman, DO as PCP - General [name], MD as Consulting Physician (General Surgery) Heath Lark, MD as Consulting Physician (Hematology and Oncology)  DIAGNOSIS: Anaplastic T-cell lymphoma SUMMARY OF ONCOLOGIC HISTORY: Oncology History   Anaplastic Lymphoma, T-cell, 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)   Staging method: AJCC 7th Edition   Clinical: Stage II signed by Heath Lark, MD on 04/03/2013  3:31 PM   Summary: Stage II      Lymphoma, T-cell   02/07/2013 Imaging [place] confirmed enlarged LN   03/09/2013 Procedure LN biopsy confirmed T cell lymphoma   03/23/2013 Procedure She has port placement   03/26/2013 Imaging Echo showed normal ejection fraction   03/29/2013 Bone Marrow Biopsy Bone marrow biopsy was negative   04/02/2013 Imaging PET/CT scan showed localized, stage II disease    INTERVAL HISTORY: Anna Jackson 53 y.o. female returns for further management She tolerated recent procedures well. She denies any new complaints I have reviewed the past medical history, past surgical history, social history and family history with the patient and they are unchanged from previous note.  ALLERGIES:  is allergic to betadine and codeine.  MEDICATIONS:  Current Outpatient Prescriptions  Medication Sig Dispense Refill  . buPROPion (WELLBUTRIN XL) 150 MG 24 hr tablet Take 150 mg by mouth every morning.      . docusate sodium (COLACE) 100 MG capsule Take 100 mg by mouth daily.      Marland Kitchen FLUoxetine (PROZAC) 40 MG capsule Take 40 mg by mouth every morning.      . furosemide (LASIX) 20 MG tablet Take 20 mg by mouth every morning.      Marland Kitchen lisinopril-hydrochlorothiazide (PRINZIDE,ZESTORETIC) 20-25 MG per tablet Take 1 tablet by mouth every morning.      Marland Kitchen omeprazole (PRILOSEC OTC) 20 MG tablet Take 20 mg by mouth daily.      Marland Kitchen allopurinol (ZYLOPRIM) 300 MG tablet Take 1 tablet (300  mg total) by mouth daily.  30 tablet  0  . lidocaine-prilocaine (EMLA) cream Apply 1 application topically as needed.  30 g  0  . lisinopril (PRINIVIL,ZESTRIL) 20 MG tablet TAKE 1 TABLET (20 MG TOTAL) BY MOUTH DAILY.  90 tablet  1  . ondansetron (ZOFRAN) 8 MG tablet Take 1 tablet (8 mg total) by mouth 2 (two) times daily. Start the day after chemo for 3 days. Then as needed for nausea or vomiting.  30 tablet  1  . predniSONE (DELTASONE) 20 MG tablet Take 3 tablets (60 mg total) by mouth daily. Take on days 1-5 of chemotherapy.  90 tablet  0  . prochlorperazine (COMPAZINE) 10 MG tablet Take 1 tablet (10 mg total) by mouth every 6 (six) hours as needed (Nausea or vomiting).  30 tablet  6   No current facility-administered medications for this visit.    REVIEW OF SYSTEMS:   All other systems were reviewed with the patient and are negative.  PHYSICAL EXAMINATION: ECOG PERFORMANCE STATUS: 0 - Asymptomatic  Filed Vitals:   04/03/13 1338  BP: 131/83  Pulse: 76  Temp: 98 F (36.7 C)  Resp: 18   Filed Weights   04/03/13 1338  Weight: 229 lb 4.8 oz (104.01 kg)    GENERAL:alert, no distress and comfortable Musculoskeletal:no cyanosis of digits and no clubbing . The port site looks okay NEURO: alert & oriented x 3 with fluent speech, no focal motor/sensory deficits  LABORATORY DATA:  I have reviewed the data as listed    Component Value Date/Time   NA 140 03/23/2013 0953   K 4.2 03/23/2013 0953   CL 101 03/23/2013 0953   CO2 26 03/23/2013 0953   GLUCOSE 91 03/23/2013 0953   BUN 13 03/23/2013 0953   CREATININE 0.86 03/23/2013 0953   CALCIUM 9.1 03/23/2013 0953   PROT 7.2 03/06/2013 1000   ALBUMIN 3.7 03/06/2013 1000   AST 15 03/06/2013 1000   ALT 16 03/06/2013 1000   ALKPHOS 162* 03/06/2013 1000   BILITOT 0.5 03/06/2013 1000   GFRNONAA 77* 03/23/2013 0953   GFRAA 90* 03/23/2013 0953    No results found for this basename: SPEP, UPEP,  kappa and lambda light chains    Lab Results  Component Value  Date   WBC 9.6 04/03/2013   NEUTROABS 6.8* 04/03/2013   HGB 12.3 04/03/2013   HCT 37.2 04/03/2013   MCV 83.7 04/03/2013   PLT 378 04/03/2013      Chemistry      Component Value Date/Time   NA 140 03/23/2013 0953   K 4.2 03/23/2013 0953   CL 101 03/23/2013 0953   CO2 26 03/23/2013 0953   BUN 13 03/23/2013 0953   CREATININE 0.86 03/23/2013 0953      Component Value Date/Time   CALCIUM 9.1 03/23/2013 0953   ALKPHOS 162* 03/06/2013 1000   AST 15 03/06/2013 1000   ALT 16 03/06/2013 1000   BILITOT 0.5 03/06/2013 1000       RADIOGRAPHIC STUDIES: I have personally reviewed the radiological images as listed and agreed with the findings in the report. Nm Pet Image Initial (pi) Skull Base To Thigh  04/02/2013   CLINICAL DATA:  Initial treatment strategy for T-cell lymphoma.  EXAM: NUCLEAR MEDICINE PET SKULL BASE TO THIGH  FASTING BLOOD GLUCOSE:  Value: 87 mg/dl  TECHNIQUE: 11.9 mCi F-18 FDG was injected intravenously. Full-ring PET imaging was performed from the skull base to thigh after the radiotracer. CT data was obtained and used for attenuation correction and anatomic localization.  COMPARISON:  None.  FINDINGS: NECK  There is a hypermetabolic 11 mm level 4 cervical node on the left (image 35). No other hypermetabolic cervical lymph nodes are identified. There is symmetric activity within the lymphoid tissue of Waldeyer's ring.No focal lesions of the pharyngeal mucosal space are identified.  CHEST  There are surgical clips in the left axilla consistent with recent nodal biopsy. There are several hypermetabolic left axillary and subpectoral lymph nodes. A 19 mm left axillary node on image 45 has an SUV max of 9.0. A 17 mm left subpectoral node on image 50 has an SUV max of 15.1. There are no hypermetabolic right axillary, internal mammary, mediastinal or hilar lymph nodes. There is no hypermetabolic pulmonary activity. The lungs are clear. There is no pleural or pericardial effusion. There is a moderate size hiatal  hernia.  ABDOMEN/PELVIS  There is no hypermetabolic activity within the liver, adrenal glands, spleen or pancreas. The spleen is normal in size. There is no hypermetabolic nodal activity. There is a group of calcifications posteriorly in the mid right kidney, likely renal or caliceal calculi. The gallbladder surgically absent. The patient has undergone ventral hernia repair. No recurrent hernia is demonstrated.  SKELETON  There is no hypermetabolic activity to suggest osseous metastatic disease. Mild interspinous activity in the lower lumbar spine is likely degenerative. Mild lower lumbar spondylosis is noted.  IMPRESSION: 1. There are several hypermetabolic left  supraclavicular, left subpectoral and left axillary lymph nodes consistent with lymphoma. 2. There is no subdiaphragmatic or marrow involvement.   Electronically Signed   By: Camie Patience M.D.   On: 04/02/2013 16:55      ASSESSMENT & PLAN:  #1 T-cell lymphoma We discussed the role of chemotherapy. The intent is for cure.  We discussed some of the risks, benefits and side-effects of Cytoxan, Adriamycin,Vincristine and Solumedrol/Prednisone.   Some of the short term side-effects included, though not limited to, risk of fatigue, weight loss, tumor lysis syndrome, risk of allergic reactions, pancytopenia, life-threatening infections, need for transfusions of blood products, nausea, vomiting, change in bowel habits, hair loss, risk of congestive heart failure, admission to hospital for various reasons, and risks of death.   Long term side-effects are also discussed including permanent damage to nerve function, chronic fatigue, and rare secondary malignancy including bone marrow disorders.   The patient is aware that the response rates discussed earlier is not guaranteed.    After a long discussion, patient made an informed decision to proceed with the prescribed plan of care and went ahead to sign the consent form today.   Patient education  material was dispensed #2 tumor lysis prophylaxis I will prescribe allopurinol and encourage oral intake. I have discontinue her lisinopril due to risk of kidney injury #3 history of hypertension I recommend the patient to check your blood pressure regularly. I recommend she take Lasix periodically if she has excessive fluid retention #4 preventive care I recommend she take vitamin D supplementation  Orders Placed This Encounter  Procedures  . Comprehensive metabolic panel    Standing Status: Future     Number of Occurrences:      Standing Expiration Date: 04/03/2014  . CBC with Differential    Standing Status: Future     Number of Occurrences:      Standing Expiration Date: 04/03/2014  . Lactate dehydrogenase    Standing Status: Future     Number of Occurrences:      Standing Expiration Date: 04/03/2014   All questions were answered. The patient knows to call the clinic with any problems, questions or concerns. No barriers to learning was detected. I spent 40 minutes counseling the patient face to face. The total time spent in the appointment was 60 minutes and more than 50% was on counseling and review of test results     [facility], [place], MD 04/03/2013 3:42 PM

## 2013-04-03 NOTE — Telephone Encounter (Signed)
gv pt appt schedule for feb/march °

## 2013-04-04 ENCOUNTER — Ambulatory Visit (HOSPITAL_BASED_OUTPATIENT_CLINIC_OR_DEPARTMENT_OTHER): Payer: BC Managed Care – PPO

## 2013-04-04 VITALS — BP 123/83 | HR 81 | Temp 97.8°F | Resp 18

## 2013-04-04 DIAGNOSIS — IMO0002 Reserved for concepts with insufficient information to code with codable children: Secondary | ICD-10-CM

## 2013-04-04 DIAGNOSIS — C8590 Non-Hodgkin lymphoma, unspecified, unspecified site: Secondary | ICD-10-CM

## 2013-04-04 DIAGNOSIS — Z5111 Encounter for antineoplastic chemotherapy: Secondary | ICD-10-CM

## 2013-04-04 MED ORDER — SODIUM CHLORIDE 0.9 % IJ SOLN
10.0000 mL | INTRAMUSCULAR | Status: DC | PRN
  Administered 2013-04-04: 10 mL
  Filled 2013-04-04: qty 10

## 2013-04-04 MED ORDER — SODIUM CHLORIDE 0.9 % IV SOLN
750.0000 mg/m2 | Freq: Once | INTRAVENOUS | Status: AC
  Administered 2013-04-04: 1680 mg via INTRAVENOUS
  Filled 2013-04-04: qty 84

## 2013-04-04 MED ORDER — VINCRISTINE SULFATE CHEMO INJECTION 1 MG/ML
2.0000 mg | Freq: Once | INTRAVENOUS | Status: AC
  Administered 2013-04-04: 2 mg via INTRAVENOUS
  Filled 2013-04-04: qty 2

## 2013-04-04 MED ORDER — DEXAMETHASONE SODIUM PHOSPHATE 20 MG/5ML IJ SOLN
INTRAMUSCULAR | Status: AC
  Filled 2013-04-04: qty 5

## 2013-04-04 MED ORDER — ETOPOSIDE CHEMO INJECTION 1 GM/50ML
100.0000 mg/m2 | Freq: Once | INTRAVENOUS | Status: AC
  Administered 2013-04-04: 220 mg via INTRAVENOUS
  Filled 2013-04-04: qty 11

## 2013-04-04 MED ORDER — ONDANSETRON 16 MG/50ML IVPB (CHCC)
INTRAVENOUS | Status: AC
  Filled 2013-04-04: qty 16

## 2013-04-04 MED ORDER — ONDANSETRON 16 MG/50ML IVPB (CHCC)
16.0000 mg | Freq: Once | INTRAVENOUS | Status: AC
  Administered 2013-04-04: 16 mg via INTRAVENOUS

## 2013-04-04 MED ORDER — DEXAMETHASONE SODIUM PHOSPHATE 20 MG/5ML IJ SOLN
20.0000 mg | Freq: Once | INTRAMUSCULAR | Status: AC
  Administered 2013-04-04: 20 mg via INTRAVENOUS

## 2013-04-04 MED ORDER — SODIUM CHLORIDE 0.9 % IV SOLN
Freq: Once | INTRAVENOUS | Status: AC
  Administered 2013-04-04: 12:00:00 via INTRAVENOUS

## 2013-04-04 MED ORDER — DOXORUBICIN HCL CHEMO IV INJECTION 2 MG/ML
50.0000 mg/m2 | Freq: Once | INTRAVENOUS | Status: AC
  Administered 2013-04-04: 112 mg via INTRAVENOUS
  Filled 2013-04-04: qty 56

## 2013-04-04 MED ORDER — HEPARIN SOD (PORK) LOCK FLUSH 100 UNIT/ML IV SOLN
500.0000 [IU] | Freq: Once | INTRAVENOUS | Status: AC | PRN
  Administered 2013-04-04: 500 [IU]
  Filled 2013-04-04: qty 5

## 2013-04-04 NOTE — Patient Instructions (Addendum)
[facility] Discharge Instructions for Patients Receiving Chemotherapy  Today you received the following chemotherapy agents etoposide/doxorubicin/vincristine/cytoxan.   To help prevent nausea and vomiting after your treatment, we encourage you to take your nausea medication as directed.   If you develop nausea and vomiting that is not controlled by your nausea medication, call the clinic.   BELOW ARE SYMPTOMS THAT SHOULD BE REPORTED IMMEDIATELY:  *FEVER GREATER THAN 100.5 F  *CHILLS WITH OR WITHOUT FEVER  NAUSEA AND VOMITING THAT IS NOT CONTROLLED WITH YOUR NAUSEA MEDICATION  *UNUSUAL SHORTNESS OF BREATH  *UNUSUAL BRUISING OR BLEEDING  TENDERNESS IN MOUTH AND THROAT WITH OR WITHOUT PRESENCE OF ULCERS  *URINARY PROBLEMS  *BOWEL PROBLEMS  UNUSUAL RASH Items with * indicate a potential emergency and should be followed up as soon as possible.  Feel free to call the clinic you have any questions or concerns. The clinic phone number is [phone].   Cyclophosphamide injection What is this medicine? CYCLOPHOSPHAMIDE (sye kloe FOSS fa mide) is a chemotherapy drug. It slows the growth of cancer cells. This medicine is used to treat many types of cancer like lymphoma, myeloma, leukemia, breast cancer, and ovarian cancer, to name a few. This medicine may be used for other purposes; ask your health care provider or pharmacist if you have questions. COMMON BRAND NAME(S): Cytoxan, Neosar What should I tell my health care provider before I take this medicine? They need to know if you have any of these conditions: -blood disorders -history of other chemotherapy -infection -kidney disease -liver disease -recent or ongoing radiation therapy -tumors in the bone marrow -an unusual or allergic reaction to cyclophosphamide, other chemotherapy, other medicines, foods, dyes, or preservatives -pregnant or trying to get pregnant -breast-feeding How should I use this  medicine? This drug is usually given as an injection into a vein or muscle or by infusion into a vein. It is administered in a hospital or clinic by a specially trained health care professional. Talk to your pediatrician regarding the use of this medicine in children. Special care may be needed. Overdosage: If you think you have taken too much of this medicine contact a poison control center or emergency room at once. NOTE: This medicine is only for you. Do not share this medicine with others. What if I miss a dose? It is important not to miss your dose. Call your doctor or health care professional if you are unable to keep an appointment. What may interact with this medicine? This medicine may interact with the following medications: -amiodarone -amphotericin B -azathioprine -certain antiviral medicines for HIV or AIDS such as protease inhibitors (e.g., indinavir, ritonavir) and zidovudine -certain blood pressure medications such as benazepril, captopril, enalapril, fosinopril, lisinopril, moexipril, monopril, perindopril, quinapril, ramipril, trandolapril -certain cancer medications such as anthracyclines (e.g., daunorubicin, doxorubicin), busulfan, cytarabine, paclitaxel, pentostatin, tamoxifen, trastuzumab -certain diuretics such as chlorothiazide, chlorthalidone, hydrochlorothiazide, indapamide, metolazone -certain medicines that treat or prevent blood clots like warfarin -certain muscle relaxants such as succinylcholine -cyclosporine -etanercept -indomethacin -medicines to increase blood counts like filgrastim, pegfilgrastim, sargramostim -medicines used as general anesthesia -metronidazole -natalizumab This list may not describe all possible interactions. Give your health care provider a list of all the medicines, herbs, non-prescription drugs, or dietary supplements you use. Also tell them if you smoke, drink alcohol, or use illegal drugs. Some items may interact with your  medicine. What should I watch for while using this medicine? Visit your doctor for checks on your progress. This drug may make  you feel generally unwell. This is not uncommon, as chemotherapy can affect healthy cells as well as cancer cells. Report any side effects. Continue your course of treatment even though you feel ill unless your doctor tells you to stop. Drink water or other fluids as directed. Urinate often, even at night. In some cases, you may be given additional medicines to help with side effects. Follow all directions for their use. Call your doctor or health care professional for advice if you get a fever, chills or sore throat, or other symptoms of a cold or flu. Do not treat yourself. This drug decreases your body's ability to fight infections. Try to avoid being around people who are sick. This medicine may increase your risk to bruise or bleed. Call your doctor or health care professional if you notice any unusual bleeding. Be careful brushing and flossing your teeth or using a toothpick because you may get an infection or bleed more easily. If you have any dental work done, tell your dentist you are receiving this medicine. You may get drowsy or dizzy. Do not drive, use machinery, or do anything that needs mental alertness until you know how this medicine affects you. Do not become pregnant while taking this medicine or for 1 year after stopping it. Women should inform their doctor if they wish to become pregnant or think they might be pregnant. Men should not father a child while taking this medicine and for 4 months after stopping it. There is a potential for serious side effects to an unborn child. Talk to your health care professional or pharmacist for more information. Do not breast-feed an infant while taking this medicine. This medicine may interfere with the ability to have a child. This medicine has caused ovarian failure in some women. This medicine has caused reduced sperm  counts in some men. You should talk with your doctor or health care professional if you are concerned about your fertility. If you are going to have surgery, tell your doctor or health care professional that you have taken this medicine. What side effects may I notice from receiving this medicine? Side effects that you should report to your doctor or health care professional as soon as possible: -allergic reactions like skin rash, itching or hives, swelling of the face, lips, or tongue -low blood counts - this medicine may decrease the number of white blood cells, red blood cells and platelets. You may be at increased risk for infections and bleeding. -signs of infection - fever or chills, cough, sore throat, pain or difficulty passing urine -signs of decreased platelets or bleeding - bruising, pinpoint red spots on the skin, black, tarry stools, blood in the urine -signs of decreased red blood cells - unusually weak or tired, fainting spells, lightheadedness -breathing problems -dark urine -dizziness -palpitations -swelling of the ankles, feet, hands -trouble passing urine or change in the amount of urine -weight gain -yellowing of the eyes or skin Side effects that usually do not require medical attention (report to your doctor or health care professional if they continue or are bothersome): -changes in nail or skin color -hair loss -missed menstrual periods -mouth sores -nausea, vomiting This list may not describe all possible side effects. Call your doctor for medical advice about side effects. You may report side effects to FDA at 1-800-FDA-1088. Where should I keep my medicine? This drug is given in a hospital or clinic and will not be stored at home. NOTE: This sheet is a summary. It may  not cover all possible information. If you have questions about this medicine, talk to your doctor, pharmacist, or health care provider.  2014, [employer] Standard. (2011-12-17  16:22:58)   Doxorubicin injection What is this medicine? DOXORUBICIN (dox oh ROO bi sin) is a chemotherapy drug. It is used to treat many kinds of cancer like Hodgkin's disease, leukemia, non-Hodgkin's lymphoma, neuroblastoma, sarcoma, and Wilms' tumor. It is also used to treat bladder cancer, breast cancer, lung cancer, ovarian cancer, stomach cancer, and thyroid cancer. This medicine may be used for other purposes; ask your health care provider or pharmacist if you have questions. COMMON BRAND NAME(S): Adriamycin PFS, Adriamycin RDF, Adriamycin, Rubex What should I tell my health care provider before I take this medicine? They need to know if you have any of these conditions: -blood disorders -heart disease, recent heart attack -infection (especially a virus infection such as chickenpox, cold sores, or herpes) -irregular heartbeat -liver disease -recent or ongoing radiation therapy -an unusual or allergic reaction to doxorubicin, other chemotherapy agents, other medicines, foods, dyes, or preservatives -pregnant or trying to get pregnant -breast-feeding How should I use this medicine? This drug is given as an infusion into a vein. It is administered in a hospital or clinic by a specially trained health care professional. If you have pain, swelling, burning or any unusual feeling around the site of your injection, tell your health care professional right away. Talk to your pediatrician regarding the use of this medicine in children. Special care may be needed. Overdosage: If you think you have taken too much of this medicine contact a poison control center or emergency room at once. NOTE: This medicine is only for you. Do not share this medicine with others. What if I miss a dose? It is important not to miss your dose. Call your doctor or health care professional if you are unable to keep an appointment. What may interact with this medicine? Do not take this medicine with any of the  following medications: -cisapride -droperidol -halofantrine -pimozide -zidovudine This medicine may also interact with the following medications: -chloroquine -chlorpromazine -clarithromycin -cyclophosphamide -cyclosporine -erythromycin -medicines for depression, anxiety, or psychotic disturbances -medicines for irregular heart beat like amiodarone, bepridil, dofetilide, encainide, flecainide, propafenone, quinidine -medicines for seizures like ethotoin, fosphenytoin, phenytoin -medicines for nausea, vomiting like dolasetron, ondansetron, palonosetron -medicines to increase blood counts like filgrastim, pegfilgrastim, sargramostim -methadone -methotrexate -pentamidine -progesterone -vaccines -verapamil Talk to your doctor or health care professional before taking any of these medicines: -acetaminophen -aspirin -ibuprofen -ketoprofen -naproxen This list may not describe all possible interactions. Give your health care provider a list of all the medicines, herbs, non-prescription drugs, or dietary supplements you use. Also tell them if you smoke, drink alcohol, or use illegal drugs. Some items may interact with your medicine. What should I watch for while using this medicine? Your condition will be monitored carefully while you are receiving this medicine. You will need important blood work done while you are taking this medicine. This drug may make you feel generally unwell. This is not uncommon, as chemotherapy can affect healthy cells as well as cancer cells. Report any side effects. Continue your course of treatment even though you feel ill unless your doctor tells you to stop. Your urine may turn red for a few days after your dose. This is not blood. If your urine is dark or brown, call your doctor. In some cases, you may be given additional medicines to help with side effects. Follow all directions for their  use. Call your doctor or health care professional for advice if you  get a fever, chills or sore throat, or other symptoms of a cold or flu. Do not treat yourself. This drug decreases your body's ability to fight infections. Try to avoid being around people who are sick. This medicine may increase your risk to bruise or bleed. Call your doctor or health care professional if you notice any unusual bleeding. Be careful brushing and flossing your teeth or using a toothpick because you may get an infection or bleed more easily. If you have any dental work done, tell your dentist you are receiving this medicine. Avoid taking products that contain aspirin, acetaminophen, ibuprofen, naproxen, or ketoprofen unless instructed by your doctor. These medicines may hide a fever. Men and women of childbearing age should use effective birth control methods while using taking this medicine. Do not become pregnant while taking this medicine. There is a potential for serious side effects to an unborn child. Talk to your health care professional or pharmacist for more information. Do not breast-feed an infant while taking this medicine. Do not let others touch your urine or other body fluids for 5 days after each treatment with this medicine. Caregivers should wear latex gloves to avoid touching body fluids during this time. There is a maximum amount of this medicine you should receive throughout your life. The amount depends on the medical condition being treated and your overall health. Your doctor will watch how much of this medicine you receive in your lifetime. Tell your doctor if you have taken this medicine before. What side effects may I notice from receiving this medicine? Side effects that you should report to your doctor or health care professional as soon as possible: -allergic reactions like skin rash, itching or hives, swelling of the face, lips, or tongue -low blood counts - this medicine may decrease the number of white blood cells, red blood cells and platelets. You may be at  increased risk for infections and bleeding. -signs of infection - fever or chills, cough, sore throat, pain or difficulty passing urine -signs of decreased platelets or bleeding - bruising, pinpoint red spots on the skin, black, tarry stools, blood in the urine -signs of decreased red blood cells - unusually weak or tired, fainting spells, lightheadedness -breathing problems -chest pain -fast, irregular heartbeat -mouth sores -nausea, vomiting -pain, swelling, redness at site where injected -pain, tingling, numbness in the hands or feet -swelling of ankles, feet, or hands -unusual bleeding or bruising Side effects that usually do not require medical attention (report to your doctor or health care professional if they continue or are bothersome): -diarrhea -facial flushing -hair loss -loss of appetite -missed menstrual periods -nail discoloration or damage -red or watery eyes -red colored urine -stomach upset This list may not describe all possible side effects. Call your doctor for medical advice about side effects. You may report side effects to FDA at 1-800-FDA-1088. Where should I keep my medicine? This drug is given in a hospital or clinic and will not be stored at home. NOTE: This sheet is a summary. It may not cover all possible information. If you have questions about this medicine, talk to your doctor, pharmacist, or health care provider.  2014, [employer] Standard. (2012-05-30 09:54:34)  Etoposide, VP-16 injection What is this medicine? ETOPOSIDE, VP-16 (e toe POE side) is a chemotherapy drug. It is used to treat testicular cancer, lung cancer, and other cancers. This medicine may be used for other purposes; ask  your health care provider or pharmacist if you have questions. COMMON BRAND NAME(S): Etopophos, Toposar, VePesid What should I tell my health care provider before I take this medicine? They need to know if you have any of these conditions: -infection -kidney  disease -low blood counts, like low white cell, platelet, or red cell counts -an unusual or allergic reaction to etoposide, other chemotherapeutic agents, other medicines, foods, dyes, or preservatives -pregnant or trying to get pregnant -breast-feeding How should I use this medicine? This medicine is for infusion into a vein. It is administered in a hospital or clinic by a specially trained health care professional. Talk to your pediatrician regarding the use of this medicine in children. Special care may be needed. Overdosage: If you think you have taken too much of this medicine contact a poison control center or emergency room at once. NOTE: This medicine is only for you. Do not share this medicine with others. What if I miss a dose? It is important not to miss your dose. Call your doctor or health care professional if you are unable to keep an appointment. What may interact with this medicine? -cyclosporine -medicines to increase blood counts like filgrastim, pegfilgrastim, sargramostim -vaccines This list may not describe all possible interactions. Give your health care provider a list of all the medicines, herbs, non-prescription drugs, or dietary supplements you use. Also tell them if you smoke, drink alcohol, or use illegal drugs. Some items may interact with your medicine. What should I watch for while using this medicine? Visit your doctor for checks on your progress. This drug may make you feel generally unwell. This is not uncommon, as chemotherapy can affect healthy cells as well as cancer cells. Report any side effects. Continue your course of treatment even though you feel ill unless your doctor tells you to stop. In some cases, you may be given additional medicines to help with side effects. Follow all directions for their use. Call your doctor or health care professional for advice if you get a fever, chills or sore throat, or other symptoms of a cold or flu. Do not treat  yourself. This drug decreases your body's ability to fight infections. Try to avoid being around people who are sick. This medicine may increase your risk to bruise or bleed. Call your doctor or health care professional if you notice any unusual bleeding. Be careful brushing and flossing your teeth or using a toothpick because you may get an infection or bleed more easily. If you have any dental work done, tell your dentist you are receiving this medicine. Avoid taking products that contain aspirin, acetaminophen, ibuprofen, naproxen, or ketoprofen unless instructed by your doctor. These medicines may hide a fever. Do not become pregnant while taking this medicine. Women should inform their doctor if they wish to become pregnant or think they might be pregnant. There is a potential for serious side effects to an unborn child. Talk to your health care professional or pharmacist for more information. Do not breast-feed an infant while taking this medicine. What side effects may I notice from receiving this medicine? Side effects that you should report to your doctor or health care professional as soon as possible: -allergic reactions like skin rash, itching or hives, swelling of the face, lips, or tongue -low blood counts - this medicine may decrease the number of white blood cells, red blood cells and platelets. You may be at increased risk for infections and bleeding. -signs of infection - fever or chills, cough,  sore throat, pain or difficulty passing urine -signs of decreased platelets or bleeding - bruising, pinpoint red spots on the skin, black, tarry stools, blood in the urine -signs of decreased red blood cells - unusually weak or tired, fainting spells, lightheadedness -breathing problems -changes in vision -mouth or throat sores or ulcers -pain, redness, swelling or irritation at the injection site -pain, tingling, numbness in the hands or feet -redness, blistering, peeling or loosening of  the skin, including inside the mouth -seizures -vomiting Side effects that usually do not require medical attention (report to your doctor or health care professional if they continue or are bothersome): -diarrhea -hair loss -loss of appetite -nausea -stomach pain This list may not describe all possible side effects. Call your doctor for medical advice about side effects. You may report side effects to FDA at 1-800-FDA-1088. Where should I keep my medicine? This drug is given in a hospital or clinic and will not be stored at home. NOTE: This sheet is a summary. It may not cover all possible information. If you have questions about this medicine, talk to your doctor, pharmacist, or health care provider.  2014, [employer] Standard. (2007-06-05 17:24:12)  Vincristine injection What is this medicine? VINCRISTINE (vin KRIS teen) is a chemotherapy drug. It slows the growth of cancer cells. This medicine is used to treat many types of cancer like Hodgkin's disease, leukemia, non-Hodgkin's lymphoma, neuroblastoma (brain cancer), rhabdomyosarcoma, and Wilms' tumor. This medicine may be used for other purposes; ask your health care provider or pharmacist if you have questions. COMMON BRAND NAME(S): Oncovin, Vincasar PFS What should I tell my health care provider before I take this medicine? They need to know if you have any of these conditions: -blood disorders -gout -infection (especially chickenpox, cold sores, or herpes) -kidney disease -liver disease -lung disease -nervous system disease like Charcot-Marie-Tooth (CMT) -recent or ongoing radiation therapy -an unusual or allergic reaction to vincristine, other chemotherapy agents, other medicines, foods, dyes, or preservatives -pregnant or trying to get pregnant -breast-feeding How should I use this medicine? This drug is given as an infusion into a vein. It is administered in a hospital or clinic by a specially trained health care  professional. If you have pain, swelling, burning, or any unusual feeling around the site of your injection, tell your health care professional right away. Talk to your pediatrician regarding the use of this medicine in children. While this drug may be prescribed for selected conditions, precautions do apply. Overdosage: If you think you have taken too much of this medicine contact a poison control center or emergency room at once. NOTE: This medicine is only for you. Do not share this medicine with others. What if I miss a dose? It is important not to miss your dose. Call your doctor or health care professional if you are unable to keep an appointment. What may interact with this medicine? Do not take this medicine with any of the following medications: -itraconazole -mibefradil -voriconazole This medicine may also interact with the following medications: -cyclosporine -erythromycin -fluconazole -ketoconazole -medicines for HIV like delavirdine, efavirenz, nevirapine -medicines for seizures like ethotoin, fosphenotoin, phenytoin -medicines to increase blood counts like filgrastim, pegfilgrastim, sargramostim -other chemotherapy drugs like cisplatin, L-asparaginase, methotrexate, mitomycin, paclitaxel -pegaspargase -vaccines -zalcitabine, ddC Talk to your doctor or health care professional before taking any of these medicines: -acetaminophen -aspirin -ibuprofen -ketoprofen -naproxen This list may not describe all possible interactions. Give your health care provider a list of all the medicines, herbs, non-prescription drugs, or  dietary supplements you use. Also tell them if you smoke, drink alcohol, or use illegal drugs. Some items may interact with your medicine. What should I watch for while using this medicine? Your condition will be monitored carefully while you are receiving this medicine. You will need important blood work done while you are taking this medicine. This drug may  make you feel generally unwell. This is not uncommon, as chemotherapy can affect healthy cells as well as cancer cells. Report any side effects. Continue your course of treatment even though you feel ill unless your doctor tells you to stop. In some cases, you may be given additional medicines to help with side effects. Follow all directions for their use. Call your doctor or health care professional for advice if you get a fever, chills or sore throat, or other symptoms of a cold or flu. Do not treat yourself. Avoid taking products that contain aspirin, acetaminophen, ibuprofen, naproxen, or ketoprofen unless instructed by your doctor. These medicines may hide a fever. Do not become pregnant while taking this medicine. Women should inform their doctor if they wish to become pregnant or think they might be pregnant. There is a potential for serious side effects to an unborn child. Talk to your health care professional or pharmacist for more information. Do not breast-feed an infant while taking this medicine. Men may have a lower sperm count while taking this medicine. Talk to your doctor if you plan to father a child. What side effects may I notice from receiving this medicine? Side effects that you should report to your doctor or health care professional as soon as possible: -allergic reactions like skin rash, itching or hives, swelling of the face, lips, or tongue -breathing problems -confusion or changes in emotions or moods -constipation -cough -mouth sores -muscle weakness -nausea and vomiting -pain, swelling, redness or irritation at the injection site -pain, tingling, numbness in the hands or feet -problems with balance, talking, walking -seizures -stomach pain -trouble passing urine or change in the amount of urine Side effects that usually do not require medical attention (report to your doctor or health care professional if they continue or are bothersome): -diarrhea -hair  loss -jaw pain -loss of appetite This list may not describe all possible side effects. Call your doctor for medical advice about side effects. You may report side effects to FDA at 1-800-FDA-1088. Where should I keep my medicine? This drug is given in a hospital or clinic and will not be stored at home. NOTE: This sheet is a summary. It may not cover all possible information. If you have questions about this medicine, talk to your doctor, pharmacist, or health care provider.  2014, Elsevier/Gold Standard. (2007-10-30 17:17:13)   Pegfilgrastim injection What is this medicine? PEGFILGRASTIM (peg fil GRA stim) helps the body make more white blood cells. It is used to prevent infection in people with low amounts of white blood cells following cancer treatment. This medicine may be used for other purposes; ask your health care provider or pharmacist if you have questions. COMMON BRAND NAME(S): Neulasta What should I tell my health care provider before I take this medicine? They need to know if you have any of these conditions: -sickle cell disease -an unusual or allergic reaction to pegfilgrastim, filgrastim, E.coli protein, other medicines, foods, dyes, or preservatives -pregnant or trying to get pregnant -breast-feeding How should I use this medicine? This medicine is for injection under the skin. It is usually given by a health care professional in  a hospital or clinic setting. If you get this medicine at home, you will be taught how to prepare and give this medicine. Do not shake this medicine. Use exactly as directed. Take your medicine at regular intervals. Do not take your medicine more often than directed. It is important that you put your used needles and syringes in a special sharps container. Do not put them in a trash can. If you do not have a sharps container, call your pharmacist or healthcare provider to get one. Talk to your pediatrician regarding the use of this medicine in  children. While this drug may be prescribed for children who weigh more than 45 kg for selected conditions, precautions do apply Overdosage: If you think you have taken too much of this medicine contact a poison control center or emergency room at once. NOTE: This medicine is only for you. Do not share this medicine with others. What if I miss a dose? If you miss a dose, take it as soon as you can. If it is almost time for your next dose, take only that dose. Do not take double or extra doses. What may interact with this medicine? -lithium -medicines for growth therapy This list may not describe all possible interactions. Give your health care provider a list of all the medicines, herbs, non-prescription drugs, or dietary supplements you use. Also tell them if you smoke, drink alcohol, or use illegal drugs. Some items may interact with your medicine. What should I watch for while using this medicine? Visit your doctor for regular check ups. You will need important blood work done while you are taking this medicine. What side effects may I notice from receiving this medicine? Side effects that you should report to your doctor or health care professional as soon as possible: -allergic reactions like skin rash, itching or hives, swelling of the face, lips, or tongue -breathing problems -fever -pain, redness, or swelling where injected -shoulder pain -stomach or side pain Side effects that usually do not require medical attention (report to your doctor or health care professional if they continue or are bothersome): -aches, pains -headache -loss of appetite -nausea, vomiting -unusually tired This list may not describe all possible side effects. Call your doctor for medical advice about side effects. You may report side effects to FDA at 1-800-FDA-1088. Where should I keep my medicine? Keep out of the reach of children. Store in a refrigerator between 2 and 8 degrees C (36 and 46 degrees F).  Do not freeze. Keep in carton to protect from light. Throw away this medicine if it is left out of the refrigerator for more than 48 hours. Throw away any unused medicine after the expiration date. NOTE: This sheet is a summary. It may not cover all possible information. If you have questions about this medicine, talk to your doctor, pharmacist, or health care provider.  2014, Elsevier/Gold Standard. (2007-09-04 15:41:44)

## 2013-04-05 ENCOUNTER — Ambulatory Visit (HOSPITAL_BASED_OUTPATIENT_CLINIC_OR_DEPARTMENT_OTHER): Payer: BC Managed Care – PPO

## 2013-04-05 VITALS — BP 126/65 | HR 58 | Temp 97.8°F | Resp 18

## 2013-04-05 DIAGNOSIS — C8590 Non-Hodgkin lymphoma, unspecified, unspecified site: Secondary | ICD-10-CM

## 2013-04-05 DIAGNOSIS — Z5111 Encounter for antineoplastic chemotherapy: Secondary | ICD-10-CM

## 2013-04-05 DIAGNOSIS — IMO0002 Reserved for concepts with insufficient information to code with codable children: Secondary | ICD-10-CM

## 2013-04-05 MED ORDER — HEPARIN SOD (PORK) LOCK FLUSH 100 UNIT/ML IV SOLN
500.0000 [IU] | Freq: Once | INTRAVENOUS | Status: AC | PRN
  Administered 2013-04-05: 500 [IU]
  Filled 2013-04-05: qty 5

## 2013-04-05 MED ORDER — ONDANSETRON 8 MG/50ML IVPB (CHCC)
8.0000 mg | Freq: Once | INTRAVENOUS | Status: AC
  Administered 2013-04-05: 8 mg via INTRAVENOUS

## 2013-04-05 MED ORDER — SODIUM CHLORIDE 0.9 % IJ SOLN
10.0000 mL | INTRAMUSCULAR | Status: DC | PRN
  Administered 2013-04-05: 10 mL
  Filled 2013-04-05: qty 10

## 2013-04-05 MED ORDER — ONDANSETRON 8 MG/NS 50 ML IVPB
INTRAVENOUS | Status: AC
  Filled 2013-04-05: qty 8

## 2013-04-05 MED ORDER — SODIUM CHLORIDE 0.9 % IV SOLN
100.0000 mg/m2 | Freq: Once | INTRAVENOUS | Status: AC
  Administered 2013-04-05: 220 mg via INTRAVENOUS
  Filled 2013-04-05: qty 11

## 2013-04-05 MED ORDER — SODIUM CHLORIDE 0.9 % IV SOLN
Freq: Once | INTRAVENOUS | Status: AC
  Administered 2013-04-05: 11:00:00 via INTRAVENOUS

## 2013-04-05 NOTE — Patient Instructions (Signed)
[facility] Discharge Instructions for Patients Receiving Chemotherapy  Today you received the following chemotherapy agents Etoposide.   To help prevent nausea and vomiting after your treatment, we encourage you to take your nausea medication as prescribed.    If you develop nausea and vomiting that is not controlled by your nausea medication, call the clinic.   BELOW ARE SYMPTOMS THAT SHOULD BE REPORTED IMMEDIATELY:  *FEVER GREATER THAN 100.5 F  *CHILLS WITH OR WITHOUT FEVER  NAUSEA AND VOMITING THAT IS NOT CONTROLLED WITH YOUR NAUSEA MEDICATION  *UNUSUAL SHORTNESS OF BREATH  *UNUSUAL BRUISING OR BLEEDING  TENDERNESS IN MOUTH AND THROAT WITH OR WITHOUT PRESENCE OF ULCERS  *URINARY PROBLEMS  *BOWEL PROBLEMS  UNUSUAL RASH Items with * indicate a potential emergency and should be followed up as soon as possible.  Feel free to call the clinic should you have any questions or concerns. The clinic phone number is [phone].  It was my pleasure to take care of you today!  Tina Jilberto Vanderwall, RN    

## 2013-04-06 ENCOUNTER — Ambulatory Visit (HOSPITAL_BASED_OUTPATIENT_CLINIC_OR_DEPARTMENT_OTHER): Payer: BC Managed Care – PPO

## 2013-04-06 VITALS — BP 137/85 | HR 68 | Temp 98.5°F | Resp 18

## 2013-04-06 DIAGNOSIS — C8590 Non-Hodgkin lymphoma, unspecified, unspecified site: Secondary | ICD-10-CM

## 2013-04-06 DIAGNOSIS — IMO0002 Reserved for concepts with insufficient information to code with codable children: Secondary | ICD-10-CM

## 2013-04-06 DIAGNOSIS — Z5111 Encounter for antineoplastic chemotherapy: Secondary | ICD-10-CM

## 2013-04-06 MED ORDER — SODIUM CHLORIDE 0.9 % IJ SOLN
10.0000 mL | INTRAMUSCULAR | Status: DC | PRN
  Administered 2013-04-06: 10 mL
  Filled 2013-04-06: qty 10

## 2013-04-06 MED ORDER — ONDANSETRON 8 MG/50ML IVPB (CHCC)
8.0000 mg | Freq: Once | INTRAVENOUS | Status: AC
  Administered 2013-04-06: 8 mg via INTRAVENOUS

## 2013-04-06 MED ORDER — ONDANSETRON 8 MG/NS 50 ML IVPB
INTRAVENOUS | Status: AC
  Filled 2013-04-06: qty 8

## 2013-04-06 MED ORDER — SODIUM CHLORIDE 0.9 % IV SOLN
100.0000 mg/m2 | Freq: Once | INTRAVENOUS | Status: AC
  Administered 2013-04-06: 220 mg via INTRAVENOUS
  Filled 2013-04-06: qty 11

## 2013-04-06 MED ORDER — SODIUM CHLORIDE 0.9 % IV SOLN
Freq: Once | INTRAVENOUS | Status: AC
  Administered 2013-04-06: 15:00:00 via INTRAVENOUS

## 2013-04-06 MED ORDER — HEPARIN SOD (PORK) LOCK FLUSH 100 UNIT/ML IV SOLN
500.0000 [IU] | Freq: Once | INTRAVENOUS | Status: AC | PRN
  Administered 2013-04-06: 500 [IU]
  Filled 2013-04-06: qty 5

## 2013-04-06 NOTE — Patient Instructions (Signed)
[facility] Discharge Instructions for Patients Receiving Chemotherapy  Today you received the following chemotherapy agents: Etoposide.  To help prevent nausea and vomiting after your treatment, we encourage you to take your nausea medication,   If you develop nausea and vomiting that is not controlled by your nausea medication, call the clinic.   BELOW ARE SYMPTOMS THAT SHOULD BE REPORTED IMMEDIATELY:  *FEVER GREATER THAN 100.5 F  *CHILLS WITH OR WITHOUT FEVER  NAUSEA AND VOMITING THAT IS NOT CONTROLLED WITH YOUR NAUSEA MEDICATION  *UNUSUAL SHORTNESS OF BREATH  *UNUSUAL BRUISING OR BLEEDING  TENDERNESS IN MOUTH AND THROAT WITH OR WITHOUT PRESENCE OF ULCERS  *URINARY PROBLEMS  *BOWEL PROBLEMS  UNUSUAL RASH Items with * indicate a potential emergency and should be followed up as soon as possible.  Feel free to call the clinic you have any questions or concerns. The clinic phone number is [phone].

## 2013-04-07 ENCOUNTER — Ambulatory Visit (HOSPITAL_BASED_OUTPATIENT_CLINIC_OR_DEPARTMENT_OTHER): Payer: BC Managed Care – PPO

## 2013-04-07 VITALS — BP 134/70 | HR 57 | Temp 98.2°F | Resp 18

## 2013-04-07 DIAGNOSIS — Z5189 Encounter for other specified aftercare: Secondary | ICD-10-CM

## 2013-04-07 DIAGNOSIS — C8590 Non-Hodgkin lymphoma, unspecified, unspecified site: Secondary | ICD-10-CM

## 2013-04-07 DIAGNOSIS — C962 Malignant mast cell neoplasm, unspecified: Secondary | ICD-10-CM

## 2013-04-07 MED ORDER — PEGFILGRASTIM INJECTION 6 MG/0.6ML
6.0000 mg | Freq: Once | SUBCUTANEOUS | Status: AC
  Administered 2013-04-07: 6 mg via SUBCUTANEOUS

## 2013-04-07 NOTE — Patient Instructions (Signed)

## 2013-04-09 ENCOUNTER — Telehealth: Payer: Self-pay | Admitting: *Deleted

## 2013-04-09 NOTE — Telephone Encounter (Signed)
Message copied by Cherylynn Ridges on Mon Apr 09, 2013  3:40 PM ------      Message from: Prentiss Bells      Created: Wed Apr 04, 2013 12:35 PM      Regarding: First time chemo      Contact: [phone]       VP/Cytoxan/Adria/vincristine ------

## 2013-04-09 NOTE — Telephone Encounter (Signed)
[facility] for chemotherapy F/U.  Patient is doing well.  Denies n/v.  Denies any new side effects or symptoms.  "Bowels have not moved in a week but this is normal for me."  Bladder is functioning well.  Eating and drinking well and I instructed to drink 64 oz minimum daily or at least the day before, of and after treatment.  Denies questions at this time and encouraged to call if needed.  Reviewed how to call after hours in the case of an emergency.

## 2013-04-09 NOTE — Telephone Encounter (Signed)
Message copied by Cherylynn Ridges on Mon Apr 09, 2013  3:44 PM ------      Message from: Prentiss Bells      Created: Wed Apr 04, 2013 12:35 PM      Regarding: First time chemo      Contact: [phone]       VP/Cytoxan/Adria/vincristine ------

## 2013-04-10 ENCOUNTER — Telehealth: Payer: Self-pay | Admitting: *Deleted

## 2013-04-10 LAB — CHROMOSOME ANALYSIS, BONE MARROW

## 2013-04-10 NOTE — Telephone Encounter (Signed)
Yes, of course, as tolerated

## 2013-04-10 NOTE — Telephone Encounter (Signed)
Pt states she is feeling so well after her first chemo treatment,  She asks if ok w/ Dr. Alvy Bimler for her to either work from home or go into work on the days she feels well?

## 2013-04-10 NOTE — Telephone Encounter (Signed)
Informed pt Dr. Alvy Bimler states ok for pt to work as tolerated.  She requests letter faxed to [facility] Jackson in HR at fax [phone].   Letter faxed and copy left for pt out front to pick up at her convenience.

## 2013-04-11 ENCOUNTER — Other Ambulatory Visit: Payer: Self-pay | Admitting: Hematology and Oncology

## 2013-04-11 ENCOUNTER — Telehealth: Payer: Self-pay | Admitting: *Deleted

## 2013-04-11 ENCOUNTER — Encounter: Payer: Self-pay | Admitting: Hematology and Oncology

## 2013-04-11 DIAGNOSIS — C8590 Non-Hodgkin lymphoma, unspecified, unspecified site: Secondary | ICD-10-CM

## 2013-04-11 DIAGNOSIS — B009 Herpesviral infection, unspecified: Secondary | ICD-10-CM

## 2013-04-11 MED ORDER — ACYCLOVIR 400 MG PO TABS: 400.0000 mg | ORAL_TABLET | Freq: Every day | ORAL | Status: DC

## 2013-04-11 NOTE — Progress Notes (Signed)
Enrolled pt in the [employer] First Step program.  I faxed signed form and activated card today.  °

## 2013-04-11 NOTE — Progress Notes (Signed)
I spoke with the patient over the phone. 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 I prescribed acyclovir for her to be taken 5 times a day for 7 days.

## 2013-04-11 NOTE — Telephone Encounter (Signed)
I'm wondering whether it is mucositis versus herpes simplex Can the patient take a picture?

## 2013-04-11 NOTE — Telephone Encounter (Signed)
Asked pt to take a picture and send to Dr. Alvy Bimler cell phone.  She agreed.

## 2013-04-11 NOTE — Telephone Encounter (Signed)
Pt states has a fever blister on her lip since this weekend, which is not going away.  In the past her doctor has prescribed Valtrex for this.  Will Dr.Gorsuch order valtrex?

## 2013-04-13 ENCOUNTER — Telehealth: Payer: Self-pay | Admitting: *Deleted

## 2013-04-13 NOTE — Telephone Encounter (Signed)
Pt requests Rx for Cranial Prosthesis to pick up.  She is still looking for wig shop that takes her insurance. Informed pt will leave a Rx,  One wig voucher for [employer] shop and a list of wig shops in envelope out front for her to pick up.  She plans to pick up on Monday.  Also gave her some numbers to wig shops over the phone.  Rx left for Dr. Alvy Bimler to sign on Monday.

## 2013-04-16 ENCOUNTER — Encounter (HOSPITAL_COMMUNITY): Payer: Self-pay

## 2013-04-25 ENCOUNTER — Ambulatory Visit (HOSPITAL_BASED_OUTPATIENT_CLINIC_OR_DEPARTMENT_OTHER): Payer: BC Managed Care – PPO | Admitting: Hematology and Oncology

## 2013-04-25 ENCOUNTER — Encounter: Payer: Self-pay | Admitting: Hematology and Oncology

## 2013-04-25 ENCOUNTER — Ambulatory Visit (HOSPITAL_BASED_OUTPATIENT_CLINIC_OR_DEPARTMENT_OTHER): Payer: BC Managed Care – PPO

## 2013-04-25 ENCOUNTER — Other Ambulatory Visit (HOSPITAL_BASED_OUTPATIENT_CLINIC_OR_DEPARTMENT_OTHER): Payer: BC Managed Care – PPO

## 2013-04-25 ENCOUNTER — Telehealth: Payer: Self-pay | Admitting: Hematology and Oncology

## 2013-04-25 VITALS — BP 128/93 | HR 77 | Temp 98.1°F | Resp 18 | Ht 68.0 in | Wt 228.8 lb

## 2013-04-25 DIAGNOSIS — Z5111 Encounter for antineoplastic chemotherapy: Secondary | ICD-10-CM

## 2013-04-25 DIAGNOSIS — R5381 Other malaise: Secondary | ICD-10-CM

## 2013-04-25 DIAGNOSIS — IMO0002 Reserved for concepts with insufficient information to code with codable children: Secondary | ICD-10-CM

## 2013-04-25 DIAGNOSIS — C8590 Non-Hodgkin lymphoma, unspecified, unspecified site: Secondary | ICD-10-CM

## 2013-04-25 DIAGNOSIS — I10 Essential (primary) hypertension: Secondary | ICD-10-CM

## 2013-04-25 DIAGNOSIS — R5383 Other fatigue: Secondary | ICD-10-CM

## 2013-04-25 LAB — CBC WITH DIFFERENTIAL/PLATELET
BASO%: 0.7 % (ref 0.0–2.0)
Basophils Absolute: 0 10*3/uL (ref 0.0–0.1)
EOS ABS: 0 10*3/uL (ref 0.0–0.5)
EOS%: 0 % (ref 0.0–7.0)
HEMATOCRIT: 34.6 % — AB (ref 34.8–46.6)
HGB: 11.5 g/dL — ABNORMAL LOW (ref 11.6–15.9)
LYMPH%: 14.4 % (ref 14.0–49.7)
MCH: 27.8 pg (ref 25.1–34.0)
MCHC: 33.2 g/dL (ref 31.5–36.0)
MCV: 83.6 fL (ref 79.5–101.0)
MONO#: 0.7 10*3/uL (ref 0.1–0.9)
MONO%: 11 % (ref 0.0–14.0)
NEUT#: 4.7 10*3/uL (ref 1.5–6.5)
NEUT%: 73.9 % (ref 38.4–76.8)
PLATELETS: 461 10*3/uL — AB (ref 145–400)
RBC: 4.14 10*6/uL (ref 3.70–5.45)
RDW: 14.1 % (ref 11.2–14.5)
WBC: 6.4 10*3/uL (ref 3.9–10.3)
lymph#: 0.9 10*3/uL (ref 0.9–3.3)

## 2013-04-25 LAB — LACTATE DEHYDROGENASE (CC13): LDH: 188 U/L (ref 125–245)

## 2013-04-25 LAB — COMPREHENSIVE METABOLIC PANEL (CC13)
ALBUMIN: 3.6 g/dL (ref 3.5–5.0)
ALK PHOS: 129 U/L (ref 40–150)
ALT: 17 U/L (ref 0–55)
AST: 17 U/L (ref 5–34)
Anion Gap: 9 mEq/L (ref 3–11)
BILIRUBIN TOTAL: 0.31 mg/dL (ref 0.20–1.20)
BUN: 9.6 mg/dL (ref 7.0–26.0)
CO2: 26 mEq/L (ref 22–29)
Calcium: 9.6 mg/dL (ref 8.4–10.4)
Chloride: 105 mEq/L (ref 98–109)
Creatinine: 0.7 mg/dL (ref 0.6–1.1)
GLUCOSE: 91 mg/dL (ref 70–140)
POTASSIUM: 3.7 meq/L (ref 3.5–5.1)
SODIUM: 140 meq/L (ref 136–145)
TOTAL PROTEIN: 6.7 g/dL (ref 6.4–8.3)

## 2013-04-25 MED ORDER — SODIUM CHLORIDE 0.9 % IV SOLN
100.0000 mg/m2 | Freq: Once | INTRAVENOUS | Status: AC
  Administered 2013-04-25: 220 mg via INTRAVENOUS
  Filled 2013-04-25: qty 11

## 2013-04-25 MED ORDER — SODIUM CHLORIDE 0.9 % IJ SOLN
10.0000 mL | INTRAMUSCULAR | Status: DC | PRN
  Administered 2013-04-25: 10 mL
  Filled 2013-04-25: qty 10

## 2013-04-25 MED ORDER — DEXAMETHASONE SODIUM PHOSPHATE 20 MG/5ML IJ SOLN
20.0000 mg | Freq: Once | INTRAMUSCULAR | Status: AC
  Administered 2013-04-25: 20 mg via INTRAVENOUS

## 2013-04-25 MED ORDER — SODIUM CHLORIDE 0.9 % IV SOLN
750.0000 mg/m2 | Freq: Once | INTRAVENOUS | Status: AC
  Administered 2013-04-25: 1680 mg via INTRAVENOUS
  Filled 2013-04-25: qty 84

## 2013-04-25 MED ORDER — ONDANSETRON 16 MG/50ML IVPB (CHCC)
16.0000 mg | Freq: Once | INTRAVENOUS | Status: AC
  Administered 2013-04-25: 16 mg via INTRAVENOUS

## 2013-04-25 MED ORDER — HEPARIN SOD (PORK) LOCK FLUSH 100 UNIT/ML IV SOLN
500.0000 [IU] | Freq: Once | INTRAVENOUS | Status: AC | PRN
  Administered 2013-04-25: 500 [IU]
  Filled 2013-04-25: qty 5

## 2013-04-25 MED ORDER — DEXAMETHASONE SODIUM PHOSPHATE 20 MG/5ML IJ SOLN
INTRAMUSCULAR | Status: AC
  Filled 2013-04-25: qty 5

## 2013-04-25 MED ORDER — VINCRISTINE SULFATE CHEMO INJECTION 1 MG/ML
2.0000 mg | Freq: Once | INTRAVENOUS | Status: AC
  Administered 2013-04-25: 2 mg via INTRAVENOUS
  Filled 2013-04-25: qty 2

## 2013-04-25 MED ORDER — SODIUM CHLORIDE 0.9 % IV SOLN
Freq: Once | INTRAVENOUS | Status: AC
  Administered 2013-04-25: 10:00:00 via INTRAVENOUS

## 2013-04-25 MED ORDER — DOXORUBICIN HCL CHEMO IV INJECTION 2 MG/ML
50.0000 mg/m2 | Freq: Once | INTRAVENOUS | Status: AC
  Administered 2013-04-25: 112 mg via INTRAVENOUS
  Filled 2013-04-25: qty 56

## 2013-04-25 MED ORDER — ONDANSETRON 16 MG/50ML IVPB (CHCC)
INTRAVENOUS | Status: AC
  Filled 2013-04-25: qty 16

## 2013-04-25 NOTE — Telephone Encounter (Signed)
gv and printed appt sched and avs for pt for March adn April....sed added tx.... °

## 2013-04-25 NOTE — Patient Instructions (Signed)
[facility] Discharge Instructions for Patients Receiving Chemotherapy  Today you received the following chemotherapy agents ADRIAMYCIN, VINCRISTINE, CYTOXAN, ETOPOSIDE  To help prevent nausea and vomiting after your treatment, we encourage you to take your nausea medication COMPAZINE ABOUT 4 PM IF NEEDED.   If you develop nausea and vomiting that is not controlled by your nausea medication, call the clinic.   BELOW ARE SYMPTOMS THAT SHOULD BE REPORTED IMMEDIATELY:  *FEVER GREATER THAN 100.5 F  *CHILLS WITH OR WITHOUT FEVER  NAUSEA AND VOMITING THAT IS NOT CONTROLLED WITH YOUR NAUSEA MEDICATION  *UNUSUAL SHORTNESS OF BREATH  *UNUSUAL BRUISING OR BLEEDING  TENDERNESS IN MOUTH AND THROAT WITH OR WITHOUT PRESENCE OF ULCERS  *URINARY PROBLEMS  *BOWEL PROBLEMS  UNUSUAL RASH Items with * indicate a potential emergency and should be followed up as soon as possible.  Feel free to call the clinic you have any questions or concerns. The clinic phone number is [phone].

## 2013-04-25 NOTE — Progress Notes (Signed)
[place] OFFICE PROGRESS NOTE  Patient Care Team: Rosalita Chessman, DO as PCP - General [name], MD as Consulting Physician (General Surgery) Heath Lark, MD as Consulting Physician (Hematology and Oncology)  DIAGNOSIS: T-cell lymphoma, seen prior to cycle 2 of treatment  SUMMARY OF ONCOLOGIC HISTORY: Oncology History   Anaplastic Lymphoma, T-cell, 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)   Staging method: AJCC 7th Edition   Clinical: Stage II signed by Heath Lark, MD on 04/03/2013  3:31 PM   Summary: Stage II      Lymphoma, T-cell   02/07/2013 Imaging [place] confirmed enlarged LN   03/09/2013 Procedure LN biopsy confirmed T cell lymphoma   03/23/2013 Procedure She has port placement   03/26/2013 Imaging Echo showed normal ejection fraction   03/29/2013 Bone Marrow Biopsy Bone marrow biopsy was negative   04/02/2013 Imaging PET/CT scan showed localized, stage II disease    INTERVAL HISTORY: [facility] 53 y.o. female returns for further followup. She is doing well.  She complained of mild fatigue. She denies any recent fever, chills, night sweats or abnormal weight loss The patient denies any mouth sores, nausea, vomiting or change in bowel habits  I have reviewed the past medical history, past surgical history, social history and family history with the patient and they are unchanged from previous note.  ALLERGIES:  is allergic to betadine and codeine.  MEDICATIONS:  Current Outpatient Prescriptions  Medication Sig Dispense Refill  . allopurinol (ZYLOPRIM) 300 MG tablet Take 1 tablet (300 mg total) by mouth daily.  30 tablet  0  . buPROPion (WELLBUTRIN XL) 150 MG 24 hr tablet Take 150 mg by mouth every morning.      . docusate sodium (COLACE) 100 MG capsule Take 100 mg by mouth daily.      Marland Kitchen FLUoxetine (PROZAC) 40 MG capsule Take 40 mg by mouth every morning.      . lidocaine-prilocaine (EMLA) cream Apply 1 application  topically as needed.  30 g  0  . omeprazole (PRILOSEC OTC) 20 MG tablet Take 20 mg by mouth daily.      . ondansetron (ZOFRAN) 8 MG tablet Take 1 tablet (8 mg total) by mouth 2 (two) times daily. Start the day after chemo for 3 days. Then as needed for nausea or vomiting.  30 tablet  1  . predniSONE (DELTASONE) 20 MG tablet Take 3 tablets (60 mg total) by mouth daily. Take on days 1-5 of chemotherapy.  90 tablet  0  . prochlorperazine (COMPAZINE) 10 MG tablet Take 1 tablet (10 mg total) by mouth every 6 (six) hours as needed (Nausea or vomiting).  30 tablet  6   No current facility-administered medications for this visit.    REVIEW OF SYSTEMS:   Constitutional: Denies fevers, chills or abnormal weight loss Eyes: Denies blurriness of vision Ears, nose, mouth, throat, and face: Denies mucositis or sore throat Respiratory: Denies cough, dyspnea or wheezes Cardiovascular: Denies palpitation, chest discomfort or lower extremity swelling Gastrointestinal:  Denies nausea, heartburn or change in bowel habits Skin: Denies abnormal skin rashes Lymphatics: Denies new lymphadenopathy or easy bruising Neurological:Denies numbness, tingling or new weaknesses Behavioral/Psych: Mood is stable, no new changes  All other systems were reviewed with the patient and are negative.  PHYSICAL EXAMINATION: ECOG PERFORMANCE STATUS: 0 - Asymptomatic  Filed Vitals:   04/25/13 0837  BP: 128/93  Pulse: 77  Temp: 98.1 F (36.7 C)  Resp: 18  Filed Weights   04/25/13 0837  Weight: 228 lb 12.8 oz (103.783 kg)    GENERAL:alert, no distress and comfortable SKIN: skin color, texture, turgor are normal, no rashes or significant lesions. Noted total alopecia EYES: normal, Conjunctiva are pink and non-injected, sclera clear OROPHARYNX:no exudate, no erythema and lips, buccal mucosa, and tongue normal  NECK: supple, thyroid normal size, non-tender, without nodularity LYMPH:  no palpable lymphadenopathy in the  cervical, axillary or inguinal LUNGS: clear to auscultation and percussion with normal breathing effort HEART: regular rate & rhythm and no murmurs and no lower extremity edema ABDOMEN:abdomen soft, non-tender and normal bowel sounds Musculoskeletal:no cyanosis of digits and no clubbing  NEURO: alert & oriented x 3 with fluent speech, no focal motor/sensory deficits  LABORATORY DATA:  I have reviewed the data as listed    Component Value Date/Time   NA 140 04/25/2013 0821   NA 140 04/03/2013 1328   K 3.7 04/25/2013 0821   K 4.1 04/03/2013 1328   CL 100 04/03/2013 1328   CO2 26 04/25/2013 0821   CO2 25 04/03/2013 1328   GLUCOSE 91 04/25/2013 0821   GLUCOSE 85 04/03/2013 1328   BUN 9.6 04/25/2013 0821   BUN 13 04/03/2013 1328   CREATININE 0.7 04/25/2013 0821   CREATININE 0.87 04/03/2013 1328   CALCIUM 9.6 04/25/2013 0821   CALCIUM 9.1 04/03/2013 1328   PROT 6.7 04/25/2013 0821   PROT 6.2 04/03/2013 1328   ALBUMIN 3.6 04/25/2013 0821   ALBUMIN 3.9 04/03/2013 1328   AST 17 04/25/2013 0821   AST 16 04/03/2013 1328   ALT 17 04/25/2013 0821   ALT 18 04/03/2013 1328   ALKPHOS 129 04/25/2013 0821   ALKPHOS 164* 04/03/2013 1328   BILITOT 0.31 04/25/2013 0821   BILITOT 0.5 04/03/2013 1328   GFRNONAA 77* 03/23/2013 0953   GFRAA 90* 03/23/2013 0953    No results found for this basename: SPEP, UPEP,  kappa and lambda light chains    Lab Results  Component Value Date   WBC 6.4 04/25/2013   NEUTROABS 4.7 04/25/2013   HGB 11.5* 04/25/2013   HCT 34.6* 04/25/2013   MCV 83.6 04/25/2013   PLT 461* 04/25/2013      Chemistry      Component Value Date/Time   NA 140 04/25/2013 0821   NA 140 04/03/2013 1328   K 3.7 04/25/2013 0821   K 4.1 04/03/2013 1328   CL 100 04/03/2013 1328   CO2 26 04/25/2013 0821   CO2 25 04/03/2013 1328   BUN 9.6 04/25/2013 0821   BUN 13 04/03/2013 1328   CREATININE 0.7 04/25/2013 0821   CREATININE 0.87 04/03/2013 1328      Component Value Date/Time   CALCIUM 9.6 04/25/2013 0821   CALCIUM 9.1  04/03/2013 1328   ALKPHOS 129 04/25/2013 0821   ALKPHOS 164* 04/03/2013 1328   AST 17 04/25/2013 0821   AST 16 04/03/2013 1328   ALT 17 04/25/2013 0821   ALT 18 04/03/2013 1328   BILITOT 0.31 04/25/2013 0821   BILITOT 0.5 04/03/2013 1328     ASSESSMENT & PLAN:  #1 T-cell lymphoma She tolerated chemotherapy well without side effects. We will proceed with treatment without dosage adjustment. #2 tumor lysis prophylaxis I will prescribe allopurinol and encourage oral intake. I have discontinue her lisinopril due to risk of kidney injury #3 history of hypertension I recommend the patient to check your blood pressure regularly. Her blood pressure monitoring at home is satisfactory. #4 preventive care I  recommend she take vitamin D supplementation  Orders Placed This Encounter  Procedures  . Comprehensive metabolic panel    Standing Status: Standing     Number of Occurrences: 6     Standing Expiration Date: 04/26/2014  . CBC with Differential    Standing Status: Standing     Number of Occurrences: 6     Standing Expiration Date: 04/26/2014   All questions were answered. The patient knows to call the clinic with any problems, questions or concerns. No barriers to learning was detected. I spent 25 minutes counseling the patient face to face. The total time spent in the appointment was 30 minutes and more than 50% was on counseling and review of test results     [facility], [place], MD 04/25/2013 9:11 AM

## 2013-04-26 ENCOUNTER — Ambulatory Visit (HOSPITAL_BASED_OUTPATIENT_CLINIC_OR_DEPARTMENT_OTHER): Payer: BC Managed Care – PPO

## 2013-04-26 VITALS — BP 127/84 | HR 71 | Temp 97.9°F

## 2013-04-26 DIAGNOSIS — C8590 Non-Hodgkin lymphoma, unspecified, unspecified site: Secondary | ICD-10-CM

## 2013-04-26 DIAGNOSIS — IMO0002 Reserved for concepts with insufficient information to code with codable children: Secondary | ICD-10-CM

## 2013-04-26 DIAGNOSIS — Z5111 Encounter for antineoplastic chemotherapy: Secondary | ICD-10-CM

## 2013-04-26 MED ORDER — DEXAMETHASONE SODIUM PHOSPHATE 20 MG/5ML IJ SOLN
INTRAMUSCULAR | Status: AC
  Filled 2013-04-26: qty 5

## 2013-04-26 MED ORDER — SODIUM CHLORIDE 0.9 % IJ SOLN
10.0000 mL | INTRAMUSCULAR | Status: DC | PRN
  Administered 2013-04-26: 10 mL
  Filled 2013-04-26: qty 10

## 2013-04-26 MED ORDER — DEXAMETHASONE SODIUM PHOSPHATE 20 MG/5ML IJ SOLN
20.0000 mg | Freq: Once | INTRAMUSCULAR | Status: AC
  Administered 2013-04-26: 20 mg via INTRAVENOUS

## 2013-04-26 MED ORDER — ONDANSETRON 16 MG/50ML IVPB (CHCC)
INTRAVENOUS | Status: AC
  Filled 2013-04-26: qty 16

## 2013-04-26 MED ORDER — HEPARIN SOD (PORK) LOCK FLUSH 100 UNIT/ML IV SOLN
500.0000 [IU] | Freq: Once | INTRAVENOUS | Status: AC | PRN
  Administered 2013-04-26: 500 [IU]
  Filled 2013-04-26: qty 5

## 2013-04-26 MED ORDER — SODIUM CHLORIDE 0.9 % IV SOLN
100.0000 mg/m2 | Freq: Once | INTRAVENOUS | Status: AC
  Administered 2013-04-26: 220 mg via INTRAVENOUS
  Filled 2013-04-26: qty 11

## 2013-04-26 MED ORDER — SODIUM CHLORIDE 0.9 % IV SOLN
Freq: Once | INTRAVENOUS | Status: AC
  Administered 2013-04-26: 12:00:00 via INTRAVENOUS

## 2013-04-26 MED ORDER — ONDANSETRON 16 MG/50ML IVPB (CHCC)
16.0000 mg | Freq: Once | INTRAVENOUS | Status: AC
  Administered 2013-04-26: 16 mg via INTRAVENOUS

## 2013-04-26 NOTE — Patient Instructions (Signed)
[facility] Discharge Instructions for Patients Receiving Chemotherapy  Today you received the following chemotherapy agents Etoposide.   To help prevent nausea and vomiting after your treatment, we encourage you to take your nausea medication as prescribed.    If you develop nausea and vomiting that is not controlled by your nausea medication, call the clinic.   BELOW ARE SYMPTOMS THAT SHOULD BE REPORTED IMMEDIATELY:  *FEVER GREATER THAN 100.5 F  *CHILLS WITH OR WITHOUT FEVER  NAUSEA AND VOMITING THAT IS NOT CONTROLLED WITH YOUR NAUSEA MEDICATION  *UNUSUAL SHORTNESS OF BREATH  *UNUSUAL BRUISING OR BLEEDING  TENDERNESS IN MOUTH AND THROAT WITH OR WITHOUT PRESENCE OF ULCERS  *URINARY PROBLEMS  *BOWEL PROBLEMS  UNUSUAL RASH Items with * indicate a potential emergency and should be followed up as soon as possible.  Feel free to call the clinic should you have any questions or concerns. The clinic phone number is [phone].  It was my pleasure to take care of you today!  Tina Jorgina Binning, RN    

## 2013-04-27 ENCOUNTER — Other Ambulatory Visit: Payer: Self-pay | Admitting: *Deleted

## 2013-04-27 ENCOUNTER — Ambulatory Visit (HOSPITAL_BASED_OUTPATIENT_CLINIC_OR_DEPARTMENT_OTHER): Payer: BC Managed Care – PPO

## 2013-04-27 ENCOUNTER — Other Ambulatory Visit: Payer: Self-pay | Admitting: Oncology

## 2013-04-27 VITALS — BP 143/71 | HR 73 | Temp 97.0°F

## 2013-04-27 DIAGNOSIS — C8590 Non-Hodgkin lymphoma, unspecified, unspecified site: Secondary | ICD-10-CM

## 2013-04-27 DIAGNOSIS — IMO0002 Reserved for concepts with insufficient information to code with codable children: Secondary | ICD-10-CM

## 2013-04-27 DIAGNOSIS — Z5111 Encounter for antineoplastic chemotherapy: Secondary | ICD-10-CM

## 2013-04-27 MED ORDER — SODIUM CHLORIDE 0.9 % IV SOLN
100.0000 mg/m2 | Freq: Once | INTRAVENOUS | Status: AC
  Administered 2013-04-27: 220 mg via INTRAVENOUS
  Filled 2013-04-27: qty 11

## 2013-04-27 MED ORDER — ONDANSETRON 16 MG/50ML IVPB (CHCC)
16.0000 mg | Freq: Once | INTRAVENOUS | Status: AC
  Administered 2013-04-27: 16 mg via INTRAVENOUS

## 2013-04-27 MED ORDER — DEXAMETHASONE SODIUM PHOSPHATE 20 MG/5ML IJ SOLN
INTRAMUSCULAR | Status: AC
  Filled 2013-04-27: qty 5

## 2013-04-27 MED ORDER — DEXAMETHASONE SODIUM PHOSPHATE 20 MG/5ML IJ SOLN
20.0000 mg | Freq: Once | INTRAMUSCULAR | Status: AC
  Administered 2013-04-27: 20 mg via INTRAVENOUS

## 2013-04-27 MED ORDER — SODIUM CHLORIDE 0.9 % IJ SOLN
10.0000 mL | INTRAMUSCULAR | Status: DC | PRN
  Administered 2013-04-27: 10 mL
  Filled 2013-04-27: qty 10

## 2013-04-27 MED ORDER — SODIUM CHLORIDE 0.9 % IV SOLN
Freq: Once | INTRAVENOUS | Status: AC
  Administered 2013-04-27: 12:00:00 via INTRAVENOUS

## 2013-04-27 MED ORDER — ONDANSETRON 16 MG/50ML IVPB (CHCC)
INTRAVENOUS | Status: AC
  Filled 2013-04-27: qty 16

## 2013-04-27 MED ORDER — HEPARIN SOD (PORK) LOCK FLUSH 100 UNIT/ML IV SOLN
500.0000 [IU] | Freq: Once | INTRAVENOUS | Status: AC | PRN
  Administered 2013-04-27: 500 [IU]
  Filled 2013-04-27: qty 5

## 2013-04-27 NOTE — Patient Instructions (Signed)
[facility] Discharge Instructions for Patients Receiving Chemotherapy  Today you received the following chemotherapy agents etoposide   To help prevent nausea and vomiting after your treatment, we encourage you to take your nausea medication as directed  If you develop nausea and vomiting that is not controlled by your nausea medication, call the clinic.   BELOW ARE SYMPTOMS THAT SHOULD BE REPORTED IMMEDIATELY:  *FEVER GREATER THAN 100.5 F  *CHILLS WITH OR WITHOUT FEVER  NAUSEA AND VOMITING THAT IS NOT CONTROLLED WITH YOUR NAUSEA MEDICATION  *UNUSUAL SHORTNESS OF BREATH  *UNUSUAL BRUISING OR BLEEDING  TENDERNESS IN MOUTH AND THROAT WITH OR WITHOUT PRESENCE OF ULCERS  *URINARY PROBLEMS  *BOWEL PROBLEMS  UNUSUAL RASH Items with * indicate a potential emergency and should be followed up as soon as possible.  Feel free to call the clinic you have any questions or concerns. The clinic phone number is [phone].  

## 2013-04-28 ENCOUNTER — Ambulatory Visit (HOSPITAL_BASED_OUTPATIENT_CLINIC_OR_DEPARTMENT_OTHER): Payer: BC Managed Care – PPO

## 2013-04-28 VITALS — BP 150/80 | HR 57 | Temp 98.2°F

## 2013-04-28 DIAGNOSIS — IMO0002 Reserved for concepts with insufficient information to code with codable children: Secondary | ICD-10-CM

## 2013-04-28 DIAGNOSIS — C8590 Non-Hodgkin lymphoma, unspecified, unspecified site: Secondary | ICD-10-CM

## 2013-04-28 DIAGNOSIS — Z5189 Encounter for other specified aftercare: Secondary | ICD-10-CM

## 2013-04-28 MED ORDER — PEGFILGRASTIM INJECTION 6 MG/0.6ML
6.0000 mg | Freq: Once | SUBCUTANEOUS | Status: AC
  Administered 2013-04-28: 6 mg via SUBCUTANEOUS

## 2013-04-28 NOTE — Patient Instructions (Signed)

## 2013-05-01 ENCOUNTER — Telehealth: Payer: Self-pay | Admitting: *Deleted

## 2013-05-01 NOTE — Telephone Encounter (Signed)
Per Dr. Lurlean Leyden,  Pt may do another enema this evening and take the miralax daily the senokot TID as recommended.   Instructed pt on above and suggested that she just needs more time for these interventions to work.  Instructed if her pain is too severe she may need to go to the ED to be disimpacted.  She verbalized understanding.

## 2013-05-01 NOTE — Telephone Encounter (Signed)
Pt reports severe constipation.  States no BM in 7 days.  She says this is normal for her to only have BM once a week but started feeling very constipated last night.  She takes 2 stool softeners daily and last night did two fleets enemas without any results.  She asks what she should try now, says it is very uncomfortable.

## 2013-05-01 NOTE — Telephone Encounter (Signed)
Pt left VM states she took Miralax and senokot tabs and still hasn't had BM.  States "it hurts so bad" and asks if anything else to do to "speed up this process?"

## 2013-05-01 NOTE — Telephone Encounter (Signed)
Per Dr. Alvy Bimler; Pt to take Miralax 17 gm once daily and Senokot 2 tabs TID.  Continue stool softeners once daily.   Instructed pt on above and to call back if not effective.  Encouraged daily regimen to prevent constipation in the future.  She verbalized understanding.

## 2013-05-02 ENCOUNTER — Telehealth: Payer: Self-pay | Admitting: *Deleted

## 2013-05-02 NOTE — Telephone Encounter (Signed)
Pt left a message that she has had success with her bowels. Wanted to let Dr Alvy Bimler know.

## 2013-05-04 ENCOUNTER — Other Ambulatory Visit: Payer: Self-pay | Admitting: Family Medicine

## 2013-05-15 ENCOUNTER — Telehealth: Payer: Self-pay | Admitting: *Deleted

## 2013-05-15 NOTE — Telephone Encounter (Signed)
Pt left VM today reporting she has been having headaches and a sore throat which "comes and goes."  She has appt to see Dr. Alvy Bimler tomorrow and will wait to see her unless Dr. Alvy Bimler needs her to come in earlier.  Pt asked for return call only if she needs to come in early,, otherwise she will keep her appt tomorrow as scheduled. Per Dr. Alvy Bimler, ok for pt to be seen tomorrow as scheduled.

## 2013-05-16 ENCOUNTER — Other Ambulatory Visit (HOSPITAL_BASED_OUTPATIENT_CLINIC_OR_DEPARTMENT_OTHER): Payer: BC Managed Care – PPO

## 2013-05-16 ENCOUNTER — Other Ambulatory Visit: Payer: Self-pay | Admitting: Hematology and Oncology

## 2013-05-16 ENCOUNTER — Other Ambulatory Visit: Payer: Self-pay | Admitting: *Deleted

## 2013-05-16 ENCOUNTER — Telehealth: Payer: Self-pay | Admitting: Hematology and Oncology

## 2013-05-16 ENCOUNTER — Ambulatory Visit (HOSPITAL_BASED_OUTPATIENT_CLINIC_OR_DEPARTMENT_OTHER): Payer: BC Managed Care – PPO | Admitting: Hematology and Oncology

## 2013-05-16 ENCOUNTER — Ambulatory Visit (HOSPITAL_BASED_OUTPATIENT_CLINIC_OR_DEPARTMENT_OTHER): Payer: BC Managed Care – PPO

## 2013-05-16 ENCOUNTER — Encounter: Payer: Self-pay | Admitting: Hematology and Oncology

## 2013-05-16 VITALS — BP 136/80 | HR 80 | Temp 98.3°F | Resp 20 | Ht 68.0 in | Wt 229.3 lb

## 2013-05-16 DIAGNOSIS — K5900 Constipation, unspecified: Secondary | ICD-10-CM

## 2013-05-16 DIAGNOSIS — C8409 Mycosis fungoides, extranodal and solid organ sites: Secondary | ICD-10-CM

## 2013-05-16 DIAGNOSIS — C8590 Non-Hodgkin lymphoma, unspecified, unspecified site: Secondary | ICD-10-CM

## 2013-05-16 DIAGNOSIS — K219 Gastro-esophageal reflux disease without esophagitis: Secondary | ICD-10-CM

## 2013-05-16 DIAGNOSIS — D649 Anemia, unspecified: Secondary | ICD-10-CM

## 2013-05-16 DIAGNOSIS — R519 Headache, unspecified: Secondary | ICD-10-CM | POA: Insufficient documentation

## 2013-05-16 DIAGNOSIS — R12 Heartburn: Secondary | ICD-10-CM

## 2013-05-16 DIAGNOSIS — R51 Headache: Secondary | ICD-10-CM

## 2013-05-16 DIAGNOSIS — Z5111 Encounter for antineoplastic chemotherapy: Secondary | ICD-10-CM

## 2013-05-16 DIAGNOSIS — J029 Acute pharyngitis, unspecified: Secondary | ICD-10-CM

## 2013-05-16 HISTORY — DX: Headache, unspecified: R51.9

## 2013-05-16 LAB — CBC WITH DIFFERENTIAL/PLATELET
BASO%: 0.9 % (ref 0.0–2.0)
BASOS ABS: 0 10*3/uL (ref 0.0–0.1)
EOS%: 0.1 % (ref 0.0–7.0)
Eosinophils Absolute: 0 10*3/uL (ref 0.0–0.5)
HEMATOCRIT: 33.9 % — AB (ref 34.8–46.6)
HGB: 11.1 g/dL — ABNORMAL LOW (ref 11.6–15.9)
LYMPH#: 0.8 10*3/uL — AB (ref 0.9–3.3)
LYMPH%: 14.5 % (ref 14.0–49.7)
MCH: 27.7 pg (ref 25.1–34.0)
MCHC: 32.6 g/dL (ref 31.5–36.0)
MCV: 84.9 fL (ref 79.5–101.0)
MONO#: 0.7 10*3/uL (ref 0.1–0.9)
MONO%: 12.7 % (ref 0.0–14.0)
NEUT#: 3.8 10*3/uL (ref 1.5–6.5)
NEUT%: 71.8 % (ref 38.4–76.8)
PLATELETS: 434 10*3/uL — AB (ref 145–400)
RBC: 3.99 10*6/uL (ref 3.70–5.45)
RDW: 17.9 % — ABNORMAL HIGH (ref 11.2–14.5)
WBC: 5.3 10*3/uL (ref 3.9–10.3)

## 2013-05-16 LAB — COMPREHENSIVE METABOLIC PANEL (CC13)
ALK PHOS: 127 U/L (ref 40–150)
ALT: 23 U/L (ref 0–55)
ANION GAP: 9 meq/L (ref 3–11)
AST: 17 U/L (ref 5–34)
Albumin: 3.6 g/dL (ref 3.5–5.0)
BUN: 7.7 mg/dL (ref 7.0–26.0)
CO2: 26 mEq/L (ref 22–29)
Calcium: 9.4 mg/dL (ref 8.4–10.4)
Chloride: 108 mEq/L (ref 98–109)
Creatinine: 0.7 mg/dL (ref 0.6–1.1)
GLUCOSE: 87 mg/dL (ref 70–140)
Potassium: 3.9 mEq/L (ref 3.5–5.1)
SODIUM: 143 meq/L (ref 136–145)
TOTAL PROTEIN: 6.5 g/dL (ref 6.4–8.3)
Total Bilirubin: 0.31 mg/dL (ref 0.20–1.20)

## 2013-05-16 MED ORDER — HEPARIN SOD (PORK) LOCK FLUSH 100 UNIT/ML IV SOLN
500.0000 [IU] | Freq: Once | INTRAVENOUS | Status: AC | PRN
  Administered 2013-05-16: 500 [IU]
  Filled 2013-05-16: qty 5

## 2013-05-16 MED ORDER — LIDOCAINE-PRILOCAINE 2.5-2.5 % EX CREA: 1.0000 "application " | TOPICAL_CREAM | CUTANEOUS | Status: DC | PRN

## 2013-05-16 MED ORDER — ETOPOSIDE CHEMO INJECTION 1 GM/50ML
100.0000 mg/m2 | Freq: Once | INTRAVENOUS | Status: AC
  Administered 2013-05-16: 220 mg via INTRAVENOUS
  Filled 2013-05-16: qty 11

## 2013-05-16 MED ORDER — DOXORUBICIN HCL CHEMO IV INJECTION 2 MG/ML
50.0000 mg/m2 | Freq: Once | INTRAVENOUS | Status: AC
  Administered 2013-05-16: 112 mg via INTRAVENOUS
  Filled 2013-05-16: qty 56

## 2013-05-16 MED ORDER — SODIUM CHLORIDE 0.9 % IV SOLN
Freq: Once | INTRAVENOUS | Status: AC
  Administered 2013-05-16: 11:00:00 via INTRAVENOUS

## 2013-05-16 MED ORDER — SODIUM CHLORIDE 0.9 % IV SOLN
750.0000 mg/m2 | Freq: Once | INTRAVENOUS | Status: AC
  Administered 2013-05-16: 1680 mg via INTRAVENOUS
  Filled 2013-05-16: qty 84

## 2013-05-16 MED ORDER — VINCRISTINE SULFATE CHEMO INJECTION 1 MG/ML
2.0000 mg | Freq: Once | INTRAVENOUS | Status: AC
  Administered 2013-05-16: 2 mg via INTRAVENOUS
  Filled 2013-05-16: qty 2

## 2013-05-16 MED ORDER — DEXAMETHASONE SODIUM PHOSPHATE 20 MG/5ML IJ SOLN
20.0000 mg | Freq: Once | INTRAMUSCULAR | Status: AC
  Administered 2013-05-16: 20 mg via INTRAVENOUS

## 2013-05-16 MED ORDER — BUTALBITAL-APAP-CAFFEINE 50-325-40 MG PO TABS: 1.0000 | ORAL_TABLET | Freq: Four times a day (QID) | ORAL | Status: AC | PRN

## 2013-05-16 MED ORDER — DEXAMETHASONE SODIUM PHOSPHATE 20 MG/5ML IJ SOLN
INTRAMUSCULAR | Status: AC
  Filled 2013-05-16: qty 5

## 2013-05-16 MED ORDER — ONDANSETRON 16 MG/50ML IVPB (CHCC)
INTRAVENOUS | Status: AC
  Filled 2013-05-16: qty 16

## 2013-05-16 MED ORDER — SODIUM CHLORIDE 0.9 % IJ SOLN
10.0000 mL | INTRAMUSCULAR | Status: DC | PRN
  Administered 2013-05-16: 10 mL
  Filled 2013-05-16: qty 10

## 2013-05-16 MED ORDER — ONDANSETRON 16 MG/50ML IVPB (CHCC)
16.0000 mg | Freq: Once | INTRAVENOUS | Status: AC
  Administered 2013-05-16: 16 mg via INTRAVENOUS

## 2013-05-16 NOTE — Progress Notes (Signed)
[facility] OFFICE PROGRESS NOTE  Patient Care Team: Rosalita Chessman, DO as PCP - General [name], MD as Consulting Physician (General Surgery) Heath Lark, MD as Consulting Physician (Hematology and Oncology)  DIAGNOSIS: T cell lymphoma, seen prior to cycle 3 of treatment  SUMMARY OF ONCOLOGIC HISTORY: Oncology History   Anaplastic Lymphoma, T-cell, 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)   Staging method: AJCC 7th Edition   Clinical: Stage II signed by Heath Lark, MD on 04/03/2013  3:31 PM   Summary: Stage II      Lymphoma, T-cell   02/07/2013 Imaging [place] confirmed enlarged LN   03/09/2013 Procedure LN biopsy confirmed T cell lymphoma   03/23/2013 Procedure She has port placement   03/26/2013 Imaging Echo showed normal ejection fraction   03/29/2013 Bone Marrow Biopsy Bone marrow biopsy was negative   04/02/2013 Imaging PET/CT scan showed localized, stage II disease    INTERVAL HISTORY: Anna Jackson 53 y.o. female returns for further follow-up. With last cycle, she complained of severe heartburn and mild sore throat. She denies mucositis. She had severe constipation for 7 days before resolution with aggressive laxative therapy. She had been taking prednisone 20 mg TID around time of treatment and had severe insomnia. She sleeps poorly and had been complaining of severe headaches for almost 2 weeks. She denies any recent fever, chills, night sweats or abnormal weight loss  I have reviewed the past medical history, past surgical history, social history and family history with the patient and they are unchanged from previous note.  ALLERGIES:  is allergic to betadine and codeine.  MEDICATIONS:  Current Outpatient Prescriptions  Medication Sig Dispense Refill  . buPROPion (WELLBUTRIN XL) 150 MG 24 hr tablet Take 1 tablet (150 mg total) by mouth daily.  90 tablet  1  . docusate sodium (COLACE) 100 MG capsule Take 100 mg by mouth  daily.      Marland Kitchen FLUoxetine (PROZAC) 40 MG capsule Take 40 mg by mouth every morning.      . lidocaine-prilocaine (EMLA) cream Apply 1 application topically as needed.  30 g  0  . omeprazole (PRILOSEC OTC) 20 MG tablet Take 20 mg by mouth daily.      . ondansetron (ZOFRAN) 8 MG tablet Take 1 tablet (8 mg total) by mouth 2 (two) times daily. Start the day after chemo for 3 days. Then as needed for nausea or vomiting.  30 tablet  1  . predniSONE (DELTASONE) 20 MG tablet Take 3 tablets (60 mg total) by mouth daily. Take on days 1-5 of chemotherapy.  90 tablet  0  . prochlorperazine (COMPAZINE) 10 MG tablet Take 1 tablet (10 mg total) by mouth every 6 (six) hours as needed (Nausea or vomiting).  30 tablet  6  . butalbital-acetaminophen-caffeine (FIORICET) 50-325-40 MG per tablet Take 1 tablet by mouth every 6 (six) hours as needed for headache.  30 tablet  0  . lidocaine-prilocaine (EMLA) cream Apply 1 application topically as needed.  30 g  0   No current facility-administered medications for this visit.    REVIEW OF SYSTEMS:   Constitutional: Denies fevers, chills or abnormal weight loss Eyes: Denies blurriness of vision Ears, nose, mouth, throat, and face: Denies mucositis  Respiratory: Denies cough, dyspnea or wheezes Cardiovascular: Denies palpitation, chest discomfort or lower extremity swelling Skin: Denies abnormal skin rashes Lymphatics: Denies new lymphadenopathy or easy bruising Neurological:Denies numbness, tingling or new weaknesses Behavioral/Psych: Mood  is stable, no new changes  All other systems were reviewed with the patient and are negative.  PHYSICAL EXAMINATION: ECOG PERFORMANCE STATUS: 1 - Symptomatic but completely ambulatory  Filed Vitals:   05/16/13 1006  BP: 136/80  Pulse: 80  Temp: 98.3 F (36.8 C)  Resp: 20   Filed Weights   05/16/13 1006  Weight: 229 lb 4.8 oz (104.01 kg)    GENERAL:alert, no distress and comfortable. She is obese. SKIN: skin color,  texture, turgor are normal, no rashes or significant lesions EYES: normal, Conjunctiva are pale and non-injected, sclera clear OROPHARYNX:no exudate, no erythema and lips, buccal mucosa, and tongue normal  NECK: supple, thyroid normal size, non-tender, without nodularity LYMPH:  no palpable lymphadenopathy in the cervical, axillary or inguinal LUNGS: clear to auscultation and percussion with normal breathing effort HEART: regular rate & rhythm and no murmurs and no lower extremity edema ABDOMEN:abdomen soft, non-tender and normal bowel sounds Musculoskeletal:no cyanosis of digits and no clubbing  NEURO: alert & oriented x 3 with fluent speech, no focal motor/sensory deficits  LABORATORY DATA:  I have reviewed the data as listed    Component Value Date/Time   NA 143 05/16/2013 0947   NA 140 04/03/2013 1328   K 3.9 05/16/2013 0947   K 4.1 04/03/2013 1328   CL 100 04/03/2013 1328   CO2 26 05/16/2013 0947   CO2 25 04/03/2013 1328   GLUCOSE 87 05/16/2013 0947   GLUCOSE 85 04/03/2013 1328   BUN 7.7 05/16/2013 0947   BUN 13 04/03/2013 1328   CREATININE 0.7 05/16/2013 0947   CREATININE 0.87 04/03/2013 1328   CALCIUM 9.4 05/16/2013 0947   CALCIUM 9.1 04/03/2013 1328   PROT 6.5 05/16/2013 0947   PROT 6.2 04/03/2013 1328   ALBUMIN 3.6 05/16/2013 0947   ALBUMIN 3.9 04/03/2013 1328   AST 17 05/16/2013 0947   AST 16 04/03/2013 1328   ALT 23 05/16/2013 0947   ALT 18 04/03/2013 1328   ALKPHOS 127 05/16/2013 0947   ALKPHOS 164* 04/03/2013 1328   BILITOT 0.31 05/16/2013 0947   BILITOT 0.5 04/03/2013 1328   GFRNONAA 77* 03/23/2013 0953   GFRAA 90* 03/23/2013 0953    No results found for this basename: SPEP, UPEP,  kappa and lambda light chains    Lab Results  Component Value Date   WBC 5.3 05/16/2013   NEUTROABS 3.8 05/16/2013   HGB 11.1* 05/16/2013   HCT 33.9* 05/16/2013   MCV 84.9 05/16/2013   PLT 434* 05/16/2013      Chemistry      Component Value Date/Time   NA 143 05/16/2013 0947   NA 140 04/03/2013 1328   K 3.9 05/16/2013  0947   K 4.1 04/03/2013 1328   CL 100 04/03/2013 1328   CO2 26 05/16/2013 0947   CO2 25 04/03/2013 1328   BUN 7.7 05/16/2013 0947   BUN 13 04/03/2013 1328   CREATININE 0.7 05/16/2013 0947   CREATININE 0.87 04/03/2013 1328      Component Value Date/Time   CALCIUM 9.4 05/16/2013 0947   CALCIUM 9.1 04/03/2013 1328   ALKPHOS 127 05/16/2013 0947   ALKPHOS 164* 04/03/2013 1328   AST 17 05/16/2013 0947   AST 16 04/03/2013 1328   ALT 23 05/16/2013 0947   ALT 18 04/03/2013 1328   BILITOT 0.31 05/16/2013 0947   BILITOT 0.5 04/03/2013 1328     ASSESSMENT & PLAN:  #1 T Cell lymphoma I will continue treatment without dosage adjustment. I plan to  order repeat staging PET scan before cycle 4 #2 Headaches I suspect this is multi-factorial. She was attempting to cut down on caffeine intake due to severe heartburn. She was also taking prednisone late in the day causing poor sleep. I am giving her a prescription Fioricet to take as needed for headache. If her headaches is still persistent, I will consider ordering an MRI of her head #3 Severe heartburn I plan to reduce prednisone to 40 mg only and advised her to take them in the morning with food #4 Anemia  This is likely due to recent treatment. The patient denies recent history of bleeding such as epistaxis, hematuria or hematochezia. She is asymptomatic from the anemia. I will observe for now.  She does not require transfusion now. I will continue the chemotherapy at current dose without dosage adjustment.  If the anemia gets progressive worse in the future, I might have to delay her treatment or adjust the chemotherapy dose. #5 Recent constipation Recommend aggressive laxative regimen #6 Sore throat Likely side effect from GERD and from chemotherapy. Recommend conservative management   Orders Placed This Encounter  Procedures  . NM PET Image Restag (PS) Skull Base To Thigh    Standing Status: Future     Number of Occurrences:      Standing Expiration Date:  07/16/2014    Order Specific Question:  Reason for Exam (SYMPTOM  OR DIAGNOSIS REQUIRED)    Answer:  staging lymphoma, assess response to Rx    Order Specific Question:  Is the patient pregnant?    Answer:  No    Order Specific Question:  Preferred imaging location?    Answer:  [facility]   All questions were answered. The patient knows to call the clinic with any problems, questions or concerns. No barriers to learning was detected. I spent 40 minutes counseling the patient face to face. The total time spent in the appointment was 55 minutes and more than 50% was on counseling and review of test results     [facility], [place], MD 05/16/2013 8:36 PM

## 2013-05-16 NOTE — Telephone Encounter (Signed)
gv husband appt schedule for april/may

## 2013-05-17 ENCOUNTER — Ambulatory Visit (HOSPITAL_BASED_OUTPATIENT_CLINIC_OR_DEPARTMENT_OTHER): Payer: BC Managed Care – PPO

## 2013-05-17 ENCOUNTER — Other Ambulatory Visit: Payer: Self-pay | Admitting: Hematology and Oncology

## 2013-05-17 VITALS — BP 122/71 | HR 69 | Temp 98.4°F | Resp 18

## 2013-05-17 DIAGNOSIS — C8590 Non-Hodgkin lymphoma, unspecified, unspecified site: Secondary | ICD-10-CM

## 2013-05-17 DIAGNOSIS — Z5111 Encounter for antineoplastic chemotherapy: Secondary | ICD-10-CM

## 2013-05-17 DIAGNOSIS — IMO0002 Reserved for concepts with insufficient information to code with codable children: Secondary | ICD-10-CM

## 2013-05-17 MED ORDER — ONDANSETRON 16 MG/50ML IVPB (CHCC)
16.0000 mg | Freq: Once | INTRAVENOUS | Status: AC
  Administered 2013-05-17: 16 mg via INTRAVENOUS

## 2013-05-17 MED ORDER — SODIUM CHLORIDE 0.9 % IJ SOLN
10.0000 mL | INTRAMUSCULAR | Status: DC | PRN
  Administered 2013-05-17: 10 mL
  Filled 2013-05-17: qty 10

## 2013-05-17 MED ORDER — DEXAMETHASONE SODIUM PHOSPHATE 20 MG/5ML IJ SOLN
INTRAMUSCULAR | Status: AC
Start: 2013-05-17 — End: 2013-05-17
  Filled 2013-05-17: qty 5

## 2013-05-17 MED ORDER — ONDANSETRON 16 MG/50ML IVPB (CHCC)
INTRAVENOUS | Status: AC
  Filled 2013-05-17: qty 16

## 2013-05-17 MED ORDER — ETOPOSIDE CHEMO INJECTION 1 GM/50ML
100.0000 mg/m2 | Freq: Once | INTRAVENOUS | Status: AC
  Administered 2013-05-17: 220 mg via INTRAVENOUS
  Filled 2013-05-17: qty 11

## 2013-05-17 MED ORDER — SODIUM CHLORIDE 0.9 % IV SOLN
Freq: Once | INTRAVENOUS | Status: AC
  Administered 2013-05-17: 11:00:00 via INTRAVENOUS

## 2013-05-17 MED ORDER — DEXAMETHASONE SODIUM PHOSPHATE 20 MG/5ML IJ SOLN
20.0000 mg | Freq: Once | INTRAMUSCULAR | Status: AC
  Administered 2013-05-17: 20 mg via INTRAVENOUS

## 2013-05-17 MED ORDER — HEPARIN SOD (PORK) LOCK FLUSH 100 UNIT/ML IV SOLN
500.0000 [IU] | Freq: Once | INTRAVENOUS | Status: AC | PRN
  Administered 2013-05-17: 500 [IU]
  Filled 2013-05-17: qty 5

## 2013-05-17 NOTE — Patient Instructions (Signed)
[facility] Discharge Instructions for Patients Receiving Chemotherapy  Today you received the following chemotherapy agents Etoposide (VP 16) To help prevent nausea and vomiting after your treatment, we encourage you to take your nausea medication as prescribed.If you develop nausea and vomiting that is not controlled by your nausea medication, call the clinic.   BELOW ARE SYMPTOMS THAT SHOULD BE REPORTED IMMEDIATELY:  *FEVER GREATER THAN 100.5 F  *CHILLS WITH OR WITHOUT FEVER  NAUSEA AND VOMITING THAT IS NOT CONTROLLED WITH YOUR NAUSEA MEDICATION  *UNUSUAL SHORTNESS OF BREATH  *UNUSUAL BRUISING OR BLEEDING  TENDERNESS IN MOUTH AND THROAT WITH OR WITHOUT PRESENCE OF ULCERS  *URINARY PROBLEMS  *BOWEL PROBLEMS  UNUSUAL RASH Items with * indicate a potential emergency and should be followed up as soon as possible.  Feel free to call the clinic you have any questions or concerns. The clinic phone number is [phone].    

## 2013-05-18 ENCOUNTER — Ambulatory Visit (HOSPITAL_BASED_OUTPATIENT_CLINIC_OR_DEPARTMENT_OTHER): Payer: BC Managed Care – PPO

## 2013-05-18 VITALS — BP 124/82 | HR 66 | Temp 98.0°F | Resp 18

## 2013-05-18 DIAGNOSIS — C8590 Non-Hodgkin lymphoma, unspecified, unspecified site: Secondary | ICD-10-CM

## 2013-05-18 DIAGNOSIS — Z5111 Encounter for antineoplastic chemotherapy: Secondary | ICD-10-CM

## 2013-05-18 DIAGNOSIS — IMO0002 Reserved for concepts with insufficient information to code with codable children: Secondary | ICD-10-CM

## 2013-05-18 MED ORDER — SODIUM CHLORIDE 0.9 % IJ SOLN
10.0000 mL | INTRAMUSCULAR | Status: DC | PRN
  Administered 2013-05-18: 10 mL
  Filled 2013-05-18: qty 10

## 2013-05-18 MED ORDER — SODIUM CHLORIDE 0.9 % IV SOLN
100.0000 mg/m2 | Freq: Once | INTRAVENOUS | Status: AC
  Administered 2013-05-18: 220 mg via INTRAVENOUS
  Filled 2013-05-18: qty 11

## 2013-05-18 MED ORDER — SODIUM CHLORIDE 0.9 % IV SOLN
Freq: Once | INTRAVENOUS | Status: AC
  Administered 2013-05-18: 11:00:00 via INTRAVENOUS

## 2013-05-18 MED ORDER — ONDANSETRON 16 MG/50ML IVPB (CHCC)
INTRAVENOUS | Status: AC
  Filled 2013-05-18: qty 16

## 2013-05-18 MED ORDER — HEPARIN SOD (PORK) LOCK FLUSH 100 UNIT/ML IV SOLN
500.0000 [IU] | Freq: Once | INTRAVENOUS | Status: AC | PRN
  Administered 2013-05-18: 500 [IU]
  Filled 2013-05-18: qty 5

## 2013-05-18 MED ORDER — DEXAMETHASONE SODIUM PHOSPHATE 20 MG/5ML IJ SOLN
20.0000 mg | Freq: Once | INTRAMUSCULAR | Status: AC
  Administered 2013-05-18: 20 mg via INTRAVENOUS

## 2013-05-18 MED ORDER — DEXAMETHASONE SODIUM PHOSPHATE 20 MG/5ML IJ SOLN
INTRAMUSCULAR | Status: AC
  Filled 2013-05-18: qty 5

## 2013-05-18 MED ORDER — ONDANSETRON 16 MG/50ML IVPB (CHCC)
16.0000 mg | Freq: Once | INTRAVENOUS | Status: AC
  Administered 2013-05-18: 16 mg via INTRAVENOUS

## 2013-05-18 NOTE — Patient Instructions (Signed)
[facility] Discharge Instructions for Patients Receiving Chemotherapy  Today you received the following chemotherapy agent: Etoposide   To help prevent nausea and vomiting after your treatment, we encourage you to take your nausea medication as prescribed.   If you develop nausea and vomiting that is not controlled by your nausea medication, call the clinic.   BELOW ARE SYMPTOMS THAT SHOULD BE REPORTED IMMEDIATELY:  *FEVER GREATER THAN 100.5 F  *CHILLS WITH OR WITHOUT FEVER  NAUSEA AND VOMITING THAT IS NOT CONTROLLED WITH YOUR NAUSEA MEDICATION  *UNUSUAL SHORTNESS OF BREATH  *UNUSUAL BRUISING OR BLEEDING  TENDERNESS IN MOUTH AND THROAT WITH OR WITHOUT PRESENCE OF ULCERS  *URINARY PROBLEMS  *BOWEL PROBLEMS  UNUSUAL RASH Items with * indicate a potential emergency and should be followed up as soon as possible.  Feel free to call the clinic you have any questions or concerns. The clinic phone number is [phone].    

## 2013-05-19 ENCOUNTER — Ambulatory Visit (HOSPITAL_BASED_OUTPATIENT_CLINIC_OR_DEPARTMENT_OTHER): Payer: BC Managed Care – PPO

## 2013-05-19 DIAGNOSIS — C8590 Non-Hodgkin lymphoma, unspecified, unspecified site: Secondary | ICD-10-CM

## 2013-05-19 DIAGNOSIS — Z5189 Encounter for other specified aftercare: Secondary | ICD-10-CM

## 2013-05-19 DIAGNOSIS — IMO0002 Reserved for concepts with insufficient information to code with codable children: Secondary | ICD-10-CM

## 2013-05-19 MED ORDER — PEGFILGRASTIM INJECTION 6 MG/0.6ML
6.0000 mg | Freq: Once | SUBCUTANEOUS | Status: AC
  Administered 2013-05-19: 6 mg via SUBCUTANEOUS

## 2013-05-21 ENCOUNTER — Other Ambulatory Visit: Payer: Self-pay

## 2013-05-23 ENCOUNTER — Telehealth: Payer: Self-pay | Admitting: *Deleted

## 2013-05-23 NOTE — Telephone Encounter (Signed)
Should be 200 mg 4 times a day for 1 week

## 2013-05-23 NOTE — Telephone Encounter (Signed)
Left VM for pt to please call nurse back so I can relay Dr. Calton Dach message below.

## 2013-05-23 NOTE — Telephone Encounter (Signed)
Left VM detailed vm on cell phone w/ Dr. Calton Dach instructions below.  Instructed if pt can break or cut the 400 mg tablets in half, then to take 200 mg Four times daily for Seven Days.  If she needs new Rx sent to pharmacy or any questions, please call back.  Pt called back and left VM she repeated above instructions,  States understanding.  She will check her medication and call back if they cannot be broke in half and she needs a new rx.

## 2013-05-23 NOTE — Telephone Encounter (Signed)
Pt reports new "cold sore" on lip started last night and is very painful.  States she has Acyclovir 400 mg tablets,  16 pills left at home.   Should she take these? How often and for how long?

## 2013-05-25 ENCOUNTER — Other Ambulatory Visit: Payer: Self-pay | Admitting: Family Medicine

## 2013-05-28 ENCOUNTER — Other Ambulatory Visit: Payer: Self-pay | Admitting: Hematology and Oncology

## 2013-05-28 ENCOUNTER — Telehealth: Payer: Self-pay | Admitting: *Deleted

## 2013-05-28 NOTE — Telephone Encounter (Signed)
Pt left Vm reports she is not having any more headaches and does not need MRI.

## 2013-05-28 NOTE — Telephone Encounter (Signed)
Can you pls cancel MRI

## 2013-06-01 ENCOUNTER — Encounter (HOSPITAL_COMMUNITY): Payer: Self-pay

## 2013-06-01 ENCOUNTER — Ambulatory Visit (HOSPITAL_COMMUNITY)
Admission: RE | Admit: 2013-06-01 | Discharge: 2013-06-01 | Disposition: A | Discharge status: Home / Self Care | Payer: BC Managed Care – PPO | Source: Ambulatory Visit | Attending: Hematology and Oncology | Admitting: Hematology and Oncology

## 2013-06-01 DIAGNOSIS — C8589 Other specified types of non-Hodgkin lymphoma, extranodal and solid organ sites: Secondary | ICD-10-CM | POA: Insufficient documentation

## 2013-06-01 DIAGNOSIS — C8590 Non-Hodgkin lymphoma, unspecified, unspecified site: Secondary | ICD-10-CM

## 2013-06-01 LAB — GLUCOSE, CAPILLARY: Glucose-Capillary: 83 mg/dL (ref 70–99)

## 2013-06-01 MED ORDER — FLUDEOXYGLUCOSE F - 18 (FDG) INJECTION
11.6000 | Freq: Once | INTRAVENOUS | Status: AC | PRN
  Administered 2013-06-01: 11.6 via INTRAVENOUS

## 2013-06-06 ENCOUNTER — Telehealth: Payer: Self-pay | Admitting: Hematology and Oncology

## 2013-06-06 ENCOUNTER — Encounter: Payer: Self-pay | Admitting: Hematology and Oncology

## 2013-06-06 ENCOUNTER — Ambulatory Visit (HOSPITAL_BASED_OUTPATIENT_CLINIC_OR_DEPARTMENT_OTHER): Payer: BC Managed Care – PPO

## 2013-06-06 ENCOUNTER — Other Ambulatory Visit (HOSPITAL_BASED_OUTPATIENT_CLINIC_OR_DEPARTMENT_OTHER): Payer: BC Managed Care – PPO

## 2013-06-06 ENCOUNTER — Ambulatory Visit (HOSPITAL_BASED_OUTPATIENT_CLINIC_OR_DEPARTMENT_OTHER): Payer: BC Managed Care – PPO | Admitting: Hematology and Oncology

## 2013-06-06 VITALS — BP 138/83 | HR 84 | Temp 98.2°F | Resp 18 | Ht 68.0 in | Wt 228.1 lb

## 2013-06-06 DIAGNOSIS — D6481 Anemia due to antineoplastic chemotherapy: Secondary | ICD-10-CM

## 2013-06-06 DIAGNOSIS — Z5111 Encounter for antineoplastic chemotherapy: Secondary | ICD-10-CM

## 2013-06-06 DIAGNOSIS — K5900 Constipation, unspecified: Secondary | ICD-10-CM

## 2013-06-06 DIAGNOSIS — C8590 Non-Hodgkin lymphoma, unspecified, unspecified site: Secondary | ICD-10-CM

## 2013-06-06 DIAGNOSIS — D649 Anemia, unspecified: Secondary | ICD-10-CM

## 2013-06-06 DIAGNOSIS — IMO0002 Reserved for concepts with insufficient information to code with codable children: Secondary | ICD-10-CM

## 2013-06-06 DIAGNOSIS — T451X5A Adverse effect of antineoplastic and immunosuppressive drugs, initial encounter: Secondary | ICD-10-CM

## 2013-06-06 LAB — COMPREHENSIVE METABOLIC PANEL (CC13)
ALBUMIN: 3.6 g/dL (ref 3.5–5.0)
ALT: 28 U/L (ref 0–55)
ANION GAP: 9 meq/L (ref 3–11)
AST: 27 U/L (ref 5–34)
Alkaline Phosphatase: 130 U/L (ref 40–150)
BUN: 8 mg/dL (ref 7.0–26.0)
CALCIUM: 9.7 mg/dL (ref 8.4–10.4)
CHLORIDE: 108 meq/L (ref 98–109)
CO2: 24 meq/L (ref 22–29)
Creatinine: 0.7 mg/dL (ref 0.6–1.1)
GLUCOSE: 80 mg/dL (ref 70–140)
POTASSIUM: 3.9 meq/L (ref 3.5–5.1)
SODIUM: 141 meq/L (ref 136–145)
TOTAL PROTEIN: 6.5 g/dL (ref 6.4–8.3)
Total Bilirubin: 0.38 mg/dL (ref 0.20–1.20)

## 2013-06-06 LAB — CBC WITH DIFFERENTIAL/PLATELET
BASO%: 0.4 % (ref 0.0–2.0)
Basophils Absolute: 0 10*3/uL (ref 0.0–0.1)
EOS%: 0.1 % (ref 0.0–7.0)
Eosinophils Absolute: 0 10*3/uL (ref 0.0–0.5)
HCT: 34.6 % — ABNORMAL LOW (ref 34.8–46.6)
HGB: 11.5 g/dL — ABNORMAL LOW (ref 11.6–15.9)
LYMPH%: 13 % — AB (ref 14.0–49.7)
MCH: 28.5 pg (ref 25.1–34.0)
MCHC: 33.2 g/dL (ref 31.5–36.0)
MCV: 85.9 fL (ref 79.5–101.0)
MONO#: 0.6 10*3/uL (ref 0.1–0.9)
MONO%: 13.2 % (ref 0.0–14.0)
NEUT#: 3.5 10*3/uL (ref 1.5–6.5)
NEUT%: 73.3 % (ref 38.4–76.8)
Platelets: 420 10*3/uL — ABNORMAL HIGH (ref 145–400)
RBC: 4.02 10*6/uL (ref 3.70–5.45)
RDW: 20.9 % — ABNORMAL HIGH (ref 11.2–14.5)
WBC: 4.7 10*3/uL (ref 3.9–10.3)
lymph#: 0.6 10*3/uL — ABNORMAL LOW (ref 0.9–3.3)

## 2013-06-06 MED ORDER — SODIUM CHLORIDE 0.9 % IV SOLN
100.0000 mg/m2 | Freq: Once | INTRAVENOUS | Status: AC
  Administered 2013-06-06: 220 mg via INTRAVENOUS
  Filled 2013-06-06: qty 11

## 2013-06-06 MED ORDER — ONDANSETRON 16 MG/50ML IVPB (CHCC)
16.0000 mg | Freq: Once | INTRAVENOUS | Status: AC
  Administered 2013-06-06: 16 mg via INTRAVENOUS

## 2013-06-06 MED ORDER — DEXAMETHASONE SODIUM PHOSPHATE 20 MG/5ML IJ SOLN
20.0000 mg | Freq: Once | INTRAMUSCULAR | Status: AC
  Administered 2013-06-06: 20 mg via INTRAVENOUS

## 2013-06-06 MED ORDER — DOXORUBICIN HCL CHEMO IV INJECTION 2 MG/ML
50.0000 mg/m2 | Freq: Once | INTRAVENOUS | Status: AC
  Administered 2013-06-06: 112 mg via INTRAVENOUS
  Filled 2013-06-06: qty 56

## 2013-06-06 MED ORDER — ONDANSETRON 16 MG/50ML IVPB (CHCC)
INTRAVENOUS | Status: AC
  Filled 2013-06-06: qty 16

## 2013-06-06 MED ORDER — HEPARIN SOD (PORK) LOCK FLUSH 100 UNIT/ML IV SOLN
500.0000 [IU] | Freq: Once | INTRAVENOUS | Status: AC | PRN
  Administered 2013-06-06: 500 [IU]
  Filled 2013-06-06: qty 5

## 2013-06-06 MED ORDER — SODIUM CHLORIDE 0.9 % IV SOLN
750.0000 mg/m2 | Freq: Once | INTRAVENOUS | Status: AC
  Administered 2013-06-06: 1680 mg via INTRAVENOUS
  Filled 2013-06-06: qty 84

## 2013-06-06 MED ORDER — VINCRISTINE SULFATE CHEMO INJECTION 1 MG/ML
2.0000 mg | Freq: Once | INTRAVENOUS | Status: AC
  Administered 2013-06-06: 2 mg via INTRAVENOUS
  Filled 2013-06-06: qty 2

## 2013-06-06 MED ORDER — SODIUM CHLORIDE 0.9 % IV SOLN
Freq: Once | INTRAVENOUS | Status: AC
  Administered 2013-06-06: 11:00:00 via INTRAVENOUS

## 2013-06-06 MED ORDER — SODIUM CHLORIDE 0.9 % IJ SOLN
10.0000 mL | INTRAMUSCULAR | Status: DC | PRN
  Administered 2013-06-06: 10 mL
  Filled 2013-06-06: qty 10

## 2013-06-06 MED ORDER — DEXAMETHASONE SODIUM PHOSPHATE 20 MG/5ML IJ SOLN
INTRAMUSCULAR | Status: AC
  Filled 2013-06-06: qty 5

## 2013-06-06 NOTE — Progress Notes (Signed)
[facility] OFFICE PROGRESS NOTE  Patient Care Team: Lelon Perla, DO as PCP - General Ernestene Mention, MD as Consulting Physician (General Surgery) Artis Delay, MD as Consulting Physician (Hematology and Oncology)  DIAGNOSIS: Anaplastic T-cell lymphoma, seen prior to cycle 4 of treatment  SUMMARY OF ONCOLOGIC HISTORY: Oncology History   Anaplastic Lymphoma, T-cell, 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)   Staging method: AJCC 7th Edition   Clinical: Stage II signed by Artis Delay, MD on 04/03/2013  3:31 PM   Summary: Stage II      Lymphoma, T-cell   02/07/2013 Imaging [place] confirmed enlarged LN   03/09/2013 Procedure LN biopsy confirmed T cell lymphoma   03/23/2013 Procedure She has port placement   03/26/2013 Imaging Echo showed normal ejection fraction   03/29/2013 Bone Marrow Biopsy Bone marrow biopsy was negative   04/02/2013 Imaging PET/CT scan showed localized, stage II disease   04/04/2013 -  Chemotherapy The patient is started on cycle 1 of CHOP plus etoposide chemotherapy   06/01/2013 Imaging PET CT scan show complete response to treatment    INTERVAL HISTORY: Anna Jackson 53 y.o. female returns for further followup. She had no further headaches. Denies any side effects from recent treatment in the past from mild fatigue. She had some persistent constipation, resolved with laxatives.  I have reviewed the past medical history, past surgical history, social history and family history with the patient and they are unchanged from previous note.  ALLERGIES:  is allergic to betadine and codeine.  MEDICATIONS:  Current Outpatient Prescriptions  Medication Sig Dispense Refill  . buPROPion (WELLBUTRIN XL) 150 MG 24 hr tablet Take 1 tablet (150 mg total) by mouth daily.  90 tablet  1  . butalbital-acetaminophen-caffeine (FIORICET) 50-325-40 MG per tablet Take 1 tablet by mouth every 6 (six) hours as needed for headache.  30 tablet   0  . docusate sodium (COLACE) 100 MG capsule Take 100 mg by mouth daily.      Marland Kitchen FLUoxetine (PROZAC) 40 MG capsule TAKE 1 CAPSULE BY MOUTH DAILY  30 capsule  5  . lidocaine-prilocaine (EMLA) cream Apply 1 application topically as needed.  30 g  0  . omeprazole (PRILOSEC OTC) 20 MG tablet Take 20 mg by mouth daily.      . ondansetron (ZOFRAN) 8 MG tablet Take 1 tablet (8 mg total) by mouth 2 (two) times daily. Start the day after chemo for 3 days. Then as needed for nausea or vomiting.  30 tablet  1  . predniSONE (DELTASONE) 20 MG tablet Take 3 tablets (60 mg total) by mouth daily. Take on days 1-5 of chemotherapy.  90 tablet  0  . prochlorperazine (COMPAZINE) 10 MG tablet Take 1 tablet (10 mg total) by mouth every 6 (six) hours as needed (Nausea or vomiting).  30 tablet  6   No current facility-administered medications for this visit.    REVIEW OF SYSTEMS:   Constitutional: Denies fevers, chills or abnormal weight loss Eyes: Denies blurriness of vision Ears, nose, mouth, throat, and face: Denies mucositis or sore throat Respiratory: Denies cough, dyspnea or wheezes Cardiovascular: Denies palpitation, chest discomfort or lower extremity swelling Gastrointestinal:  Denies nausea, heartburn  Skin: Denies abnormal skin rashes Lymphatics: Denies new lymphadenopathy or easy bruising Neurological:Denies numbness, tingling or new weaknesses Behavioral/Psych: Mood is stable, no new changes  All other systems were reviewed with the patient and are negative.  PHYSICAL EXAMINATION: ECOG PERFORMANCE  STATUS: 1 - Symptomatic but completely ambulatory  Filed Vitals:   06/06/13 0928  BP: 138/83  Pulse: 84  Temp: 98.2 F (36.8 C)  Resp: 18   Filed Weights   06/06/13 0928  Weight: 228 lb 1.6 oz (103.465 kg)    GENERAL:alert, no distress and comfortable. She is morbidly obese SKIN: skin color, texture, turgor are normal, no rashes or significant lesions EYES: normal, Conjunctiva are pink and  non-injected, sclera clear OROPHARYNX:no exudate, no erythema and lips, buccal mucosa, and tongue normal  NECK: supple, thyroid normal size, non-tender, without nodularity LYMPH:  no palpable lymphadenopathy in the cervical, axillary or inguinal LUNGS: clear to auscultation and percussion with normal breathing effort HEART: regular rate & rhythm and no murmurs and no lower extremity edema ABDOMEN:abdomen soft, non-tender and normal bowel sounds Musculoskeletal:no cyanosis of digits and no clubbing  NEURO: alert & oriented x 3 with fluent speech, no focal motor/sensory deficits  LABORATORY DATA:  I have reviewed the data as listed    Component Value Date/Time   NA 141 06/06/2013 0917   NA 140 04/03/2013 1328   K 3.9 06/06/2013 0917   K 4.1 04/03/2013 1328   CL 100 04/03/2013 1328   CO2 24 06/06/2013 0917   CO2 25 04/03/2013 1328   GLUCOSE 80 06/06/2013 0917   GLUCOSE 85 04/03/2013 1328   BUN 8.0 06/06/2013 0917   BUN 13 04/03/2013 1328   CREATININE 0.7 06/06/2013 0917   CREATININE 0.87 04/03/2013 1328   CALCIUM 9.7 06/06/2013 0917   CALCIUM 9.1 04/03/2013 1328   PROT 6.5 06/06/2013 0917   PROT 6.2 04/03/2013 1328   ALBUMIN 3.6 06/06/2013 0917   ALBUMIN 3.9 04/03/2013 1328   AST 27 06/06/2013 0917   AST 16 04/03/2013 1328   ALT 28 06/06/2013 0917   ALT 18 04/03/2013 1328   ALKPHOS 130 06/06/2013 0917   ALKPHOS 164* 04/03/2013 1328   BILITOT 0.38 06/06/2013 0917   BILITOT 0.5 04/03/2013 1328   GFRNONAA 77* 03/23/2013 0953   GFRAA 90* 03/23/2013 0953    No results found for this basename: SPEP, UPEP,  kappa and lambda light chains    Lab Results  Component Value Date   WBC 4.7 06/06/2013   NEUTROABS 3.5 06/06/2013   HGB 11.5* 06/06/2013   HCT 34.6* 06/06/2013   MCV 85.9 06/06/2013   PLT 420* 06/06/2013      Chemistry      Component Value Date/Time   NA 141 06/06/2013 0917   NA 140 04/03/2013 1328   K 3.9 06/06/2013 0917   K 4.1 04/03/2013 1328   CL 100 04/03/2013 1328   CO2 24 06/06/2013 0917    CO2 25 04/03/2013 1328   BUN 8.0 06/06/2013 0917   BUN 13 04/03/2013 1328   CREATININE 0.7 06/06/2013 0917   CREATININE 0.87 04/03/2013 1328      Component Value Date/Time   CALCIUM 9.7 06/06/2013 0917   CALCIUM 9.1 04/03/2013 1328   ALKPHOS 130 06/06/2013 0917   ALKPHOS 164* 04/03/2013 1328   AST 27 06/06/2013 0917   AST 16 04/03/2013 1328   ALT 28 06/06/2013 0917   ALT 18 04/03/2013 1328   BILITOT 0.38 06/06/2013 0917   BILITOT 0.5 04/03/2013 1328       RADIOGRAPHIC STUDIES: I reviewed the PET/CT scan with her and family I have personally reviewed the radiological images as listed and agreed with the findings in the report.   ASSESSMENT & PLAN:  #1 T Cell  lymphoma I will continue treatment without dosage adjustment. Repeat staging PET scan show excellent response to treatment.  #2 Headaches, resolved #3 Anemia  This is likely due to recent treatment. The patient denies recent history of bleeding such as epistaxis, hematuria or hematochezia. She is asymptomatic from the anemia. I will observe for now.  She does not require transfusion now. I will continue the chemotherapy at current dose without dosage adjustment.  If the anemia gets progressive worse in the future, I might have to delay her treatment or adjust the chemotherapy dose. #4 Constipation Recommend aggressive laxative regimen  All questions were answered. The patient knows to call the clinic with any problems, questions or concerns. No barriers to learning was detected. I spent 40 minutes counseling the patient face to face. The total time spent in the appointment was 55 minutes and more than 50% was on counseling and review of test results     Heath Lark, MD 06/06/2013 10:59 AM

## 2013-06-06 NOTE — Patient Instructions (Signed)
[facility] Discharge Instructions for Patients Receiving Chemotherapy  Today you received the following chemotherapy agents Adriamycin/Vincristine/Cytoxan/VP 16 (Etoposide) To help prevent nausea and vomiting after your treatment, we encourage you to take your nausea medication as prescribed.  If you develop nausea and vomiting that is not controlled by your nausea medication, call the clinic.   BELOW ARE SYMPTOMS THAT SHOULD BE REPORTED IMMEDIATELY:  *FEVER GREATER THAN 100.5 F  *CHILLS WITH OR WITHOUT FEVER  NAUSEA AND VOMITING THAT IS NOT CONTROLLED WITH YOUR NAUSEA MEDICATION  *UNUSUAL SHORTNESS OF BREATH  *UNUSUAL BRUISING OR BLEEDING  TENDERNESS IN MOUTH AND THROAT WITH OR WITHOUT PRESENCE OF ULCERS  *URINARY PROBLEMS  *BOWEL PROBLEMS  UNUSUAL RASH Items with * indicate a potential emergency and should be followed up as soon as possible.  Feel free to call the clinic you have any questions or concerns. The clinic phone number is [phone].

## 2013-06-06 NOTE — Telephone Encounter (Signed)
gv adn pritned appt sched and avs for pt fro April thru June....sed added tx.

## 2013-06-06 NOTE — Progress Notes (Unsigned)
Positive blood return noted before every 5 mL during and after Adriamycin administration. Adriamycin pushed through a free dripping normal saline line.

## 2013-06-07 ENCOUNTER — Ambulatory Visit (HOSPITAL_BASED_OUTPATIENT_CLINIC_OR_DEPARTMENT_OTHER): Payer: BC Managed Care – PPO

## 2013-06-07 VITALS — BP 112/63 | HR 64 | Temp 98.1°F | Resp 18

## 2013-06-07 DIAGNOSIS — Z5111 Encounter for antineoplastic chemotherapy: Secondary | ICD-10-CM

## 2013-06-07 DIAGNOSIS — C8409 Mycosis fungoides, extranodal and solid organ sites: Secondary | ICD-10-CM

## 2013-06-07 DIAGNOSIS — C8590 Non-Hodgkin lymphoma, unspecified, unspecified site: Secondary | ICD-10-CM

## 2013-06-07 MED ORDER — SODIUM CHLORIDE 0.9 % IV SOLN
Freq: Once | INTRAVENOUS | Status: AC
Start: 2013-06-07 — End: 2013-06-07
  Administered 2013-06-07: 11:00:00 via INTRAVENOUS

## 2013-06-07 MED ORDER — DEXAMETHASONE SODIUM PHOSPHATE 20 MG/5ML IJ SOLN
INTRAMUSCULAR | Status: AC
  Filled 2013-06-07: qty 5

## 2013-06-07 MED ORDER — HEPARIN SOD (PORK) LOCK FLUSH 100 UNIT/ML IV SOLN
500.0000 [IU] | Freq: Once | INTRAVENOUS | Status: DC | PRN
  Filled 2013-06-07: qty 5

## 2013-06-07 MED ORDER — ONDANSETRON 16 MG/50ML IVPB (CHCC)
16.0000 mg | Freq: Once | INTRAVENOUS | Status: AC
  Administered 2013-06-07: 16 mg via INTRAVENOUS

## 2013-06-07 MED ORDER — DEXAMETHASONE SODIUM PHOSPHATE 20 MG/5ML IJ SOLN
20.0000 mg | Freq: Once | INTRAMUSCULAR | Status: AC
  Administered 2013-06-07: 20 mg via INTRAVENOUS

## 2013-06-07 MED ORDER — SODIUM CHLORIDE 0.9 % IJ SOLN
10.0000 mL | INTRAMUSCULAR | Status: DC | PRN
  Filled 2013-06-07: qty 10

## 2013-06-07 MED ORDER — ONDANSETRON 16 MG/50ML IVPB (CHCC)
INTRAVENOUS | Status: AC
  Filled 2013-06-07: qty 16

## 2013-06-07 MED ORDER — SODIUM CHLORIDE 0.9 % IV SOLN
100.0000 mg/m2 | Freq: Once | INTRAVENOUS | Status: AC
  Administered 2013-06-07: 220 mg via INTRAVENOUS
  Filled 2013-06-07: qty 11

## 2013-06-07 NOTE — Patient Instructions (Signed)
[facility] Discharge Instructions for Patients Receiving Chemotherapy  Today you received the following chemotherapy agents: Etoposide.  To help prevent nausea and vomiting after your treatment, we encourage you to take your nausea medication as prescribed.   If you develop nausea and vomiting that is not controlled by your nausea medication, call the clinic.   BELOW ARE SYMPTOMS THAT SHOULD BE REPORTED IMMEDIATELY:  *FEVER GREATER THAN 100.5 F  *CHILLS WITH OR WITHOUT FEVER  NAUSEA AND VOMITING THAT IS NOT CONTROLLED WITH YOUR NAUSEA MEDICATION  *UNUSUAL SHORTNESS OF BREATH  *UNUSUAL BRUISING OR BLEEDING  TENDERNESS IN MOUTH AND THROAT WITH OR WITHOUT PRESENCE OF ULCERS  *URINARY PROBLEMS  *BOWEL PROBLEMS  UNUSUAL RASH Items with * indicate a potential emergency and should be followed up as soon as possible.  Feel free to call the clinic you have any questions or concerns. The clinic phone number is [phone].    

## 2013-06-08 ENCOUNTER — Ambulatory Visit (HOSPITAL_BASED_OUTPATIENT_CLINIC_OR_DEPARTMENT_OTHER): Payer: BC Managed Care – PPO

## 2013-06-08 ENCOUNTER — Encounter: Payer: Self-pay | Admitting: Gastroenterology

## 2013-06-08 VITALS — BP 129/75 | HR 61 | Temp 98.3°F | Resp 18

## 2013-06-08 DIAGNOSIS — C8590 Non-Hodgkin lymphoma, unspecified, unspecified site: Secondary | ICD-10-CM

## 2013-06-08 DIAGNOSIS — IMO0002 Reserved for concepts with insufficient information to code with codable children: Secondary | ICD-10-CM

## 2013-06-08 DIAGNOSIS — Z5111 Encounter for antineoplastic chemotherapy: Secondary | ICD-10-CM

## 2013-06-08 MED ORDER — ONDANSETRON 16 MG/50ML IVPB (CHCC)
16.0000 mg | Freq: Once | INTRAVENOUS | Status: AC
  Administered 2013-06-08: 16 mg via INTRAVENOUS

## 2013-06-08 MED ORDER — DEXAMETHASONE SODIUM PHOSPHATE 20 MG/5ML IJ SOLN
INTRAMUSCULAR | Status: AC
  Filled 2013-06-08: qty 5

## 2013-06-08 MED ORDER — ONDANSETRON 16 MG/50ML IVPB (CHCC)
INTRAVENOUS | Status: AC
  Filled 2013-06-08: qty 16

## 2013-06-08 MED ORDER — HEPARIN SOD (PORK) LOCK FLUSH 100 UNIT/ML IV SOLN
500.0000 [IU] | Freq: Once | INTRAVENOUS | Status: AC | PRN
  Administered 2013-06-08: 500 [IU]
  Filled 2013-06-08: qty 5

## 2013-06-08 MED ORDER — SODIUM CHLORIDE 0.9 % IJ SOLN
10.0000 mL | INTRAMUSCULAR | Status: DC | PRN
  Administered 2013-06-08: 10 mL
  Filled 2013-06-08: qty 10

## 2013-06-08 MED ORDER — SODIUM CHLORIDE 0.9 % IV SOLN
Freq: Once | INTRAVENOUS | Status: AC
  Administered 2013-06-08: 12:00:00 via INTRAVENOUS

## 2013-06-08 MED ORDER — SODIUM CHLORIDE 0.9 % IV SOLN
100.0000 mg/m2 | Freq: Once | INTRAVENOUS | Status: AC
  Administered 2013-06-08: 220 mg via INTRAVENOUS
  Filled 2013-06-08: qty 11

## 2013-06-08 MED ORDER — DEXAMETHASONE SODIUM PHOSPHATE 20 MG/5ML IJ SOLN
20.0000 mg | Freq: Once | INTRAMUSCULAR | Status: AC
  Administered 2013-06-08: 20 mg via INTRAVENOUS

## 2013-06-08 NOTE — Patient Instructions (Signed)
[facility] Discharge Instructions for Patients Receiving Chemotherapy  Today you received the following chemotherapy agents: Etoposide  To help prevent nausea and vomiting after your treatment, we encourage you to take your nausea medication.  Take it as often as prescribed.     If you develop nausea and vomiting that is not controlled by your nausea medication, call the clinic. If it is after clinic hours your family physician or the after hours number for the clinic or go to the Emergency Department.   BELOW ARE SYMPTOMS THAT SHOULD BE REPORTED IMMEDIATELY:  *FEVER GREATER THAN 100.5 F  *CHILLS WITH OR WITHOUT FEVER  NAUSEA AND VOMITING THAT IS NOT CONTROLLED WITH YOUR NAUSEA MEDICATION  *UNUSUAL SHORTNESS OF BREATH  *UNUSUAL BRUISING OR BLEEDING  TENDERNESS IN MOUTH AND THROAT WITH OR WITHOUT PRESENCE OF ULCERS  *URINARY PROBLEMS  *BOWEL PROBLEMS  UNUSUAL RASH Items with * indicate a potential emergency and should be followed up as soon as possible.  Feel free to call the clinic you have any questions or concerns. The clinic phone number is [phone].   I have been informed and understand all the instructions given to me. I know to contact the clinic, my physician, or go to the Emergency Department if any problems should occur. I do not have any questions at this time, but understand that I may call the clinic during office hours   should I have any questions or need assistance in obtaining follow up care.    __________________________________________  _____________  __________ Signature of Patient or Authorized Representative            Date                   Time    __________________________________________ Nurse's Signature    

## 2013-06-09 ENCOUNTER — Telehealth: Payer: Self-pay | Admitting: *Deleted

## 2013-06-09 ENCOUNTER — Ambulatory Visit (HOSPITAL_BASED_OUTPATIENT_CLINIC_OR_DEPARTMENT_OTHER): Payer: BC Managed Care – PPO

## 2013-06-09 DIAGNOSIS — IMO0002 Reserved for concepts with insufficient information to code with codable children: Secondary | ICD-10-CM

## 2013-06-09 DIAGNOSIS — Z5189 Encounter for other specified aftercare: Secondary | ICD-10-CM

## 2013-06-09 MED ORDER — PEGFILGRASTIM INJECTION 6 MG/0.6ML
6.0000 mg | Freq: Once | SUBCUTANEOUS | Status: AC
  Administered 2013-06-09: 6 mg via SUBCUTANEOUS

## 2013-06-09 NOTE — Telephone Encounter (Signed)
Pt came in for neulasta injection this am.  States episode x 1 of urine incontinence " during my sleep, which has never happened before ".  This am she states some increased urine frequency without incontinence but denies any pain, burning or hesitation.  She has no chills or fever.  Lorrin denies any changes in bowel habits, no numbness or tingling in legs.  She states she has had to wear a " pad " due to some mild dribbling over the past 2 weeks.  Recommendation by this RN was for pt to monitor symptoms over the weekend and call the on call MD if any of the above occur.  Otherwise this note will be given to MD for review for possible additional recommendations.

## 2013-06-11 ENCOUNTER — Ambulatory Visit (HOSPITAL_BASED_OUTPATIENT_CLINIC_OR_DEPARTMENT_OTHER): Payer: BC Managed Care – PPO

## 2013-06-11 ENCOUNTER — Telehealth: Payer: Self-pay | Admitting: Hematology and Oncology

## 2013-06-11 ENCOUNTER — Telehealth: Payer: Self-pay | Admitting: *Deleted

## 2013-06-11 DIAGNOSIS — R300 Dysuria: Secondary | ICD-10-CM

## 2013-06-11 DIAGNOSIS — D649 Anemia, unspecified: Secondary | ICD-10-CM

## 2013-06-11 LAB — URINALYSIS, MICROSCOPIC - CHCC
BILIRUBIN (URINE): NEGATIVE
GLUCOSE UR CHCC: NEGATIVE mg/dL
KETONES: NEGATIVE mg/dL
LEUKOCYTE ESTERASE: NEGATIVE
Nitrite: NEGATIVE
PROTEIN: NEGATIVE mg/dL
Specific Gravity, Urine: 1.03 (ref 1.003–1.035)
Urobilinogen, UR: 0.2 mg/dL (ref 0.2–1)
WBC, UA: NEGATIVE (ref 0–2)
pH: 6 (ref 4.6–8.0)

## 2013-06-11 NOTE — Telephone Encounter (Signed)
Informed pt U/A showed no clear signs of infection per Dr. Alvy Bimler.  Urine sent for culture and results will take about 3 days. Instructed pt to drink plenty of fluids 2 liters water daily.  She verbalized understanding.

## 2013-06-11 NOTE — Telephone Encounter (Signed)
Pt instructed to come in for lab u/a, c&s, for c/o urinary symptoms. She verbalized understanding and will come to lab by 4 pm today.

## 2013-06-11 NOTE — Telephone Encounter (Signed)
added lab today per Cameo RN

## 2013-06-11 NOTE — Telephone Encounter (Signed)
Can you pls call maybe come in for UA and culture to r/o UTI

## 2013-06-13 ENCOUNTER — Telehealth: Payer: Self-pay | Admitting: *Deleted

## 2013-06-13 LAB — URINE CULTURE

## 2013-06-13 NOTE — Telephone Encounter (Signed)
Informed pt of urine culture negative.  Pt states she feeling better today but has been "so, so tired" the past few days and a little bit dizzy.  She says she is drinking at least 1 to 2 liters water per day and eating well.  Denies any fevers or further urinary symptoms.  Instructed pt to keep appt in 2 weeks as scheduled and call back sooner if any new or worsening symptoms. She verbalized understanding.

## 2013-06-22 ENCOUNTER — Telehealth: Payer: Self-pay | Admitting: *Deleted

## 2013-06-22 MED ORDER — CEPHALEXIN 500 MG PO CAPS: 500.0000 mg | ORAL_CAPSULE | Freq: Two times a day (BID) | ORAL | Status: DC

## 2013-06-22 NOTE — Telephone Encounter (Signed)
Pt reports both her big toenails are very loose and about to fall off.  Not painful, but a little sore.  There is pus oozing out from under her right big toenail.  Dr. Alvy Bimler ordered Keflex.  Instructed pt on new order for Keflex sent to her pharmacy.  Instructed to keep toe wrapped loosely,  Clean and dry.  Call if she develops redness, swelling or any worsening symptoms. She verbalized understanding.

## 2013-06-27 ENCOUNTER — Ambulatory Visit (HOSPITAL_BASED_OUTPATIENT_CLINIC_OR_DEPARTMENT_OTHER): Payer: BC Managed Care – PPO

## 2013-06-27 ENCOUNTER — Telehealth: Payer: Self-pay | Admitting: Hematology and Oncology

## 2013-06-27 ENCOUNTER — Other Ambulatory Visit: Payer: BC Managed Care – PPO

## 2013-06-27 ENCOUNTER — Other Ambulatory Visit (HOSPITAL_BASED_OUTPATIENT_CLINIC_OR_DEPARTMENT_OTHER): Payer: BC Managed Care – PPO

## 2013-06-27 ENCOUNTER — Ambulatory Visit (HOSPITAL_BASED_OUTPATIENT_CLINIC_OR_DEPARTMENT_OTHER): Payer: BC Managed Care – PPO | Admitting: Hematology and Oncology

## 2013-06-27 VITALS — BP 121/86 | HR 67 | Temp 97.9°F | Resp 19 | Ht 68.0 in | Wt 229.7 lb

## 2013-06-27 DIAGNOSIS — C8590 Non-Hodgkin lymphoma, unspecified, unspecified site: Secondary | ICD-10-CM

## 2013-06-27 DIAGNOSIS — D649 Anemia, unspecified: Secondary | ICD-10-CM

## 2013-06-27 DIAGNOSIS — D6481 Anemia due to antineoplastic chemotherapy: Secondary | ICD-10-CM

## 2013-06-27 DIAGNOSIS — IMO0002 Reserved for concepts with insufficient information to code with codable children: Secondary | ICD-10-CM

## 2013-06-27 DIAGNOSIS — T451X5A Adverse effect of antineoplastic and immunosuppressive drugs, initial encounter: Secondary | ICD-10-CM

## 2013-06-27 DIAGNOSIS — Z5111 Encounter for antineoplastic chemotherapy: Secondary | ICD-10-CM

## 2013-06-27 DIAGNOSIS — K5900 Constipation, unspecified: Secondary | ICD-10-CM

## 2013-06-27 DIAGNOSIS — G620 Drug-induced polyneuropathy: Secondary | ICD-10-CM

## 2013-06-27 LAB — CBC WITH DIFFERENTIAL/PLATELET
BASO%: 0.6 % (ref 0.0–2.0)
BASOS ABS: 0 10*3/uL (ref 0.0–0.1)
EOS%: 0.2 % (ref 0.0–7.0)
Eosinophils Absolute: 0 10*3/uL (ref 0.0–0.5)
HEMATOCRIT: 31.3 % — AB (ref 34.8–46.6)
HGB: 10.4 g/dL — ABNORMAL LOW (ref 11.6–15.9)
LYMPH%: 13.7 % — AB (ref 14.0–49.7)
MCH: 29.7 pg (ref 25.1–34.0)
MCHC: 33.2 g/dL (ref 31.5–36.0)
MCV: 89.5 fL (ref 79.5–101.0)
MONO#: 0.9 10*3/uL (ref 0.1–0.9)
MONO%: 18.7 % — ABNORMAL HIGH (ref 0.0–14.0)
NEUT#: 3.3 10*3/uL (ref 1.5–6.5)
NEUT%: 66.8 % (ref 38.4–76.8)
Platelets: 499 10*3/uL — ABNORMAL HIGH (ref 145–400)
RBC: 3.5 10*6/uL — ABNORMAL LOW (ref 3.70–5.45)
RDW: 21.2 % — ABNORMAL HIGH (ref 11.2–14.5)
WBC: 4.9 10*3/uL (ref 3.9–10.3)
lymph#: 0.7 10*3/uL — ABNORMAL LOW (ref 0.9–3.3)

## 2013-06-27 LAB — COMPREHENSIVE METABOLIC PANEL (CC13)
ALK PHOS: 125 U/L (ref 40–150)
ALT: 25 U/L (ref 0–55)
AST: 20 U/L (ref 5–34)
Albumin: 3.5 g/dL (ref 3.5–5.0)
Anion Gap: 10 mEq/L (ref 3–11)
BILIRUBIN TOTAL: 0.42 mg/dL (ref 0.20–1.20)
BUN: 9.2 mg/dL (ref 7.0–26.0)
CO2: 24 mEq/L (ref 22–29)
Calcium: 9.6 mg/dL (ref 8.4–10.4)
Chloride: 108 mEq/L (ref 98–109)
Creatinine: 0.7 mg/dL (ref 0.6–1.1)
GLUCOSE: 89 mg/dL (ref 70–140)
Potassium: 3.9 mEq/L (ref 3.5–5.1)
SODIUM: 141 meq/L (ref 136–145)
Total Protein: 6.2 g/dL — ABNORMAL LOW (ref 6.4–8.3)

## 2013-06-27 MED ORDER — VINCRISTINE SULFATE CHEMO INJECTION 1 MG/ML
1.0000 mg | Freq: Once | INTRAVENOUS | Status: AC
  Administered 2013-06-27: 1 mg via INTRAVENOUS
  Filled 2013-06-27: qty 1

## 2013-06-27 MED ORDER — DEXAMETHASONE SODIUM PHOSPHATE 20 MG/5ML IJ SOLN
20.0000 mg | Freq: Once | INTRAMUSCULAR | Status: AC
  Administered 2013-06-27: 20 mg via INTRAVENOUS

## 2013-06-27 MED ORDER — SODIUM CHLORIDE 0.9 % IV SOLN
100.0000 mg/m2 | Freq: Once | INTRAVENOUS | Status: AC
  Administered 2013-06-27: 220 mg via INTRAVENOUS
  Filled 2013-06-27: qty 11

## 2013-06-27 MED ORDER — SODIUM CHLORIDE 0.9 % IV SOLN
750.0000 mg/m2 | Freq: Once | INTRAVENOUS | Status: AC
  Administered 2013-06-27: 1680 mg via INTRAVENOUS
  Filled 2013-06-27: qty 84

## 2013-06-27 MED ORDER — SODIUM CHLORIDE 0.9 % IV SOLN
Freq: Once | INTRAVENOUS | Status: AC
  Administered 2013-06-27: 12:00:00 via INTRAVENOUS

## 2013-06-27 MED ORDER — HEPARIN SOD (PORK) LOCK FLUSH 100 UNIT/ML IV SOLN
500.0000 [IU] | Freq: Once | INTRAVENOUS | Status: AC | PRN
  Administered 2013-06-27: 500 [IU]
  Filled 2013-06-27: qty 5

## 2013-06-27 MED ORDER — ONDANSETRON 16 MG/50ML IVPB (CHCC)
16.0000 mg | Freq: Once | INTRAVENOUS | Status: AC
  Administered 2013-06-27: 16 mg via INTRAVENOUS

## 2013-06-27 MED ORDER — DOXORUBICIN HCL CHEMO IV INJECTION 2 MG/ML
50.0000 mg/m2 | Freq: Once | INTRAVENOUS | Status: AC
Start: 2013-06-27 — End: 2013-06-27
  Administered 2013-06-27: 112 mg via INTRAVENOUS
  Filled 2013-06-27: qty 56

## 2013-06-27 MED ORDER — SODIUM CHLORIDE 0.9 % IJ SOLN
10.0000 mL | INTRAMUSCULAR | Status: DC | PRN
  Administered 2013-06-27: 10 mL
  Filled 2013-06-27: qty 10

## 2013-06-27 MED ORDER — ONDANSETRON 16 MG/50ML IVPB (CHCC)
INTRAVENOUS | Status: AC
  Filled 2013-06-27: qty 16

## 2013-06-27 MED ORDER — DEXAMETHASONE SODIUM PHOSPHATE 20 MG/5ML IJ SOLN
INTRAMUSCULAR | Status: AC
  Filled 2013-06-27: qty 5

## 2013-06-27 NOTE — Telephone Encounter (Signed)
gv pt husband appt schedule for may/june

## 2013-06-27 NOTE — Progress Notes (Signed)
[facility] OFFICE PROGRESS NOTE  Patient Care Team: Rosalita Chessman, DO as PCP - General [name], MD as Consulting Physician (General Surgery) Heath Lark, MD as Consulting Physician (Hematology and Oncology)  DIAGNOSIS: T-cell lymphoma, seen prior to cycle 5 of treatment  SUMMARY OF ONCOLOGIC HISTORY: Oncology History   Anaplastic Lymphoma, T-cell, 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)   Staging method: AJCC 7th Edition   Clinical: Stage II signed by Heath Lark, MD on 04/03/2013  3:31 PM   Summary: Stage II      Lymphoma, T-cell   02/07/2013 Imaging [place] confirmed enlarged LN   03/09/2013 Procedure LN biopsy confirmed T cell lymphoma   03/23/2013 Procedure She has port placement   03/26/2013 Imaging Echo showed normal ejection fraction   03/29/2013 Bone Marrow Biopsy Bone marrow biopsy was negative   04/02/2013 Imaging PET/CT scan showed localized, stage II disease   04/04/2013 -  Chemotherapy The patient is started on cycle 1 of CHOP plus etoposide chemotherapy   06/01/2013 Imaging PET CT scan show complete response to treatment    INTERVAL HISTORY: [facility] 53 y.o. female returns for further followup. Recently, she developed infection under her nailbeds. She was prescribed antibiotics and has resolved since then. She is very mild peripheral neuropathy in her feet but they are not painful. The patient denies any mouth sores, nausea, vomiting or change in bowel habits She complained of fatigue. I have reviewed the past medical history, past surgical history, social history and family history with the patient and they are unchanged from previous note.  ALLERGIES:  is allergic to betadine and codeine.  MEDICATIONS:  Current Outpatient Prescriptions  Medication Sig Dispense Refill  . buPROPion (WELLBUTRIN XL) 150 MG 24 hr tablet Take 1 tablet (150 mg total) by mouth daily.  90 tablet  1  . butalbital-acetaminophen-caffeine  (FIORICET) 50-325-40 MG per tablet Take 1 tablet by mouth every 6 (six) hours as needed for headache.  30 tablet  0  . cephALEXin (KEFLEX) 500 MG capsule Take 1 capsule (500 mg total) by mouth 2 (two) times daily.  20 capsule  0  . docusate sodium (COLACE) 100 MG capsule Take 100 mg by mouth daily.      Marland Kitchen FLUoxetine (PROZAC) 40 MG capsule TAKE 1 CAPSULE BY MOUTH DAILY  30 capsule  5  . lidocaine-prilocaine (EMLA) cream Apply 1 application topically as needed.  30 g  0  . omeprazole (PRILOSEC OTC) 20 MG tablet Take 20 mg by mouth daily.      . ondansetron (ZOFRAN) 8 MG tablet Take 1 tablet (8 mg total) by mouth 2 (two) times daily. Start the day after chemo for 3 days. Then as needed for nausea or vomiting.  30 tablet  1  . predniSONE (DELTASONE) 20 MG tablet Take 3 tablets (60 mg total) by mouth daily. Take on days 1-5 of chemotherapy.  90 tablet  0  . prochlorperazine (COMPAZINE) 10 MG tablet Take 1 tablet (10 mg total) by mouth every 6 (six) hours as needed (Nausea or vomiting).  30 tablet  6   No current facility-administered medications for this visit.   Facility-Administered Medications Ordered in Other Visits  Medication Dose Route Frequency Provider Last Rate Last Dose  . cyclophosphamide (CYTOXAN) 1,680 mg in sodium chloride 0.9 % 250 mL chemo infusion  750 mg/m2 (Treatment Plan Actual) Intravenous Once Heath Lark, MD      . etoposide (VEPESID)  220 mg in sodium chloride 0.9 % 600 mL chemo infusion  100 mg/m2 (Treatment Plan Actual) Intravenous Once Heath Lark, MD      . heparin lock flush 100 unit/mL  500 Units Intracatheter Once PRN Heath Lark, MD      . sodium chloride 0.9 % injection 10 mL  10 mL Intracatheter PRN Heath Lark, MD   10 mL at 06/06/13 1407  . sodium chloride 0.9 % injection 10 mL  10 mL Intracatheter PRN Heath Lark, MD      . vinCRIStine (ONCOVIN) 1 mg in sodium chloride 0.9 % 50 mL chemo infusion  1 mg Intravenous Once Heath Lark, MD   1 mg at 06/27/13 1216    REVIEW  OF SYSTEMS:   Constitutional: Denies fevers, chills or abnormal weight loss Eyes: Denies blurriness of vision Ears, nose, mouth, throat, and face: Denies mucositis or sore throat Respiratory: Denies cough, dyspnea or wheezes Cardiovascular: Denies palpitation, chest discomfort or lower extremity swelling Gastrointestinal:  Denies nausea, heartburn or change in bowel habits Skin: Denies abnormal skin rashes Lymphatics: Denies new lymphadenopathy or easy bruising Behavioral/Psych: Mood is stable, no new changes  All other systems were reviewed with the patient and are negative.  PHYSICAL EXAMINATION: ECOG PERFORMANCE STATUS: 1 - Symptomatic but completely ambulatory  Filed Vitals:   06/27/13 1051  BP: 121/86  Pulse: 67  Temp: 97.9 F (36.6 C)  Resp: 19   Filed Weights   06/27/13 1051  Weight: 229 lb 11.2 oz (104.191 kg)    GENERAL:alert, no distress and comfortable. She is morbidly obese SKIN: skin color, texture, turgor are normal, no rashes or significant lesions. Examination of her nailbeds as satisfactory without evidence of infection underneath the nailbeds. EYES: normal, Conjunctiva are pink and non-injected, sclera clear OROPHARYNX:no exudate, no erythema and lips, buccal mucosa, and tongue normal  NECK: supple, thyroid normal size, non-tender, without nodularity LYMPH:  no palpable lymphadenopathy in the cervical, axillary or inguinal LUNGS: clear to auscultation and percussion with normal breathing effort HEART: regular rate & rhythm and no murmurs and no lower extremity edema ABDOMEN:abdomen soft, non-tender and normal bowel sounds Musculoskeletal:no cyanosis of digits and no clubbing  NEURO: alert & oriented x 3 with fluent speech, no focal motor/sensory deficits  LABORATORY DATA:  I have reviewed the data as listed    Component Value Date/Time   NA 141 06/27/2013 1032   NA 140 04/03/2013 1328   K 3.9 06/27/2013 1032   K 4.1 04/03/2013 1328   CL 100 04/03/2013 1328    CO2 24 06/27/2013 1032   CO2 25 04/03/2013 1328   GLUCOSE 89 06/27/2013 1032   GLUCOSE 85 04/03/2013 1328   BUN 9.2 06/27/2013 1032   BUN 13 04/03/2013 1328   CREATININE 0.7 06/27/2013 1032   CREATININE 0.87 04/03/2013 1328   CALCIUM 9.6 06/27/2013 1032   CALCIUM 9.1 04/03/2013 1328   PROT 6.2* 06/27/2013 1032   PROT 6.2 04/03/2013 1328   ALBUMIN 3.5 06/27/2013 1032   ALBUMIN 3.9 04/03/2013 1328   AST 20 06/27/2013 1032   AST 16 04/03/2013 1328   ALT 25 06/27/2013 1032   ALT 18 04/03/2013 1328   ALKPHOS 125 06/27/2013 1032   ALKPHOS 164* 04/03/2013 1328   BILITOT 0.42 06/27/2013 1032   BILITOT 0.5 04/03/2013 1328   GFRNONAA 77* 03/23/2013 0953   GFRAA 90* 03/23/2013 0953    No results found for this basename: SPEP,  UPEP,   kappa and lambda light chains  Lab Results  Component Value Date   WBC 4.9 06/27/2013   NEUTROABS 3.3 06/27/2013   HGB 10.4* 06/27/2013   HCT 31.3* 06/27/2013   MCV 89.5 06/27/2013   PLT 499* 06/27/2013      Chemistry      Component Value Date/Time   NA 141 06/27/2013 1032   NA 140 04/03/2013 1328   K 3.9 06/27/2013 1032   K 4.1 04/03/2013 1328   CL 100 04/03/2013 1328   CO2 24 06/27/2013 1032   CO2 25 04/03/2013 1328   BUN 9.2 06/27/2013 1032   BUN 13 04/03/2013 1328   CREATININE 0.7 06/27/2013 1032   CREATININE 0.87 04/03/2013 1328      Component Value Date/Time   CALCIUM 9.6 06/27/2013 1032   CALCIUM 9.1 04/03/2013 1328   ALKPHOS 125 06/27/2013 1032   ALKPHOS 164* 04/03/2013 1328   AST 20 06/27/2013 1032   AST 16 04/03/2013 1328   ALT 25 06/27/2013 1032   ALT 18 04/03/2013 1328   BILITOT 0.42 06/27/2013 1032   BILITOT 0.5 04/03/2013 1328     ASSESSMENT & PLAN:  #1 T Cell lymphoma I will continue treatment as scheduled. I plan to give her a total of 6 cycles of treatment. Repeat staging PET scan show excellent response to treatment.  #2 Headaches, resolved #3 Anemia  This is likely due to recent treatment. The patient denies recent history of bleeding such as epistaxis,  hematuria or hematochezia. She is asymptomatic from the anemia. I will observe for now.  She does not require transfusion now. I will continue the chemotherapy at current dose without dosage adjustment.  If the anemia gets progressive worse in the future, I might have to delay her treatment or adjust the chemotherapy dose. #4 Constipation Recommend aggressive laxative regimen #5 peripheral neuropathy This is likely due to vincristine. I will adjust the dose of her chemotherapy for this. #6 infection under nail bed, resolved  All questions were answered. The patient knows to call the clinic with any problems, questions or concerns. No barriers to learning was detected.    Heath Lark, MD 06/27/2013 12:21 PM

## 2013-06-27 NOTE — Patient Instructions (Signed)
[facility] Discharge Instructions for Patients Receiving Chemotherapy   To help prevent nausea and vomiting after your treatment, we encourage you to take your nausea medication.   If you develop nausea and vomiting that is not controlled by your nausea medication, call the clinic.   BELOW ARE SYMPTOMS THAT SHOULD BE REPORTED IMMEDIATELY:  *FEVER GREATER THAN 100.5 F  *CHILLS WITH OR WITHOUT FEVER  NAUSEA AND VOMITING THAT IS NOT CONTROLLED WITH YOUR NAUSEA MEDICATION  *UNUSUAL SHORTNESS OF BREATH  *UNUSUAL BRUISING OR BLEEDING  TENDERNESS IN MOUTH AND THROAT WITH OR WITHOUT PRESENCE OF ULCERS  *URINARY PROBLEMS  *BOWEL PROBLEMS  UNUSUAL RASH Items with * indicate a potential emergency and should be followed up as soon as possible.  Feel free to call the clinic you have any questions or concerns. The clinic phone number is [phone].    

## 2013-06-28 ENCOUNTER — Ambulatory Visit (HOSPITAL_BASED_OUTPATIENT_CLINIC_OR_DEPARTMENT_OTHER): Payer: BC Managed Care – PPO

## 2013-06-28 VITALS — BP 124/65 | HR 74 | Temp 97.8°F

## 2013-06-28 DIAGNOSIS — C8590 Non-Hodgkin lymphoma, unspecified, unspecified site: Secondary | ICD-10-CM

## 2013-06-28 DIAGNOSIS — Z5111 Encounter for antineoplastic chemotherapy: Secondary | ICD-10-CM

## 2013-06-28 DIAGNOSIS — IMO0002 Reserved for concepts with insufficient information to code with codable children: Secondary | ICD-10-CM

## 2013-06-28 MED ORDER — ONDANSETRON 16 MG/50ML IVPB (CHCC)
INTRAVENOUS | Status: AC
  Filled 2013-06-28: qty 16

## 2013-06-28 MED ORDER — DEXAMETHASONE SODIUM PHOSPHATE 20 MG/5ML IJ SOLN
INTRAMUSCULAR | Status: AC
  Filled 2013-06-28: qty 5

## 2013-06-28 MED ORDER — SODIUM CHLORIDE 0.9 % IJ SOLN
10.0000 mL | INTRAMUSCULAR | Status: DC | PRN
  Administered 2013-06-28: 10 mL via INTRAVENOUS
  Filled 2013-06-28: qty 10

## 2013-06-28 MED ORDER — HEPARIN SOD (PORK) LOCK FLUSH 100 UNIT/ML IV SOLN
500.0000 [IU] | Freq: Once | INTRAVENOUS | Status: AC
  Administered 2013-06-28: 500 [IU] via INTRAVENOUS
  Filled 2013-06-28: qty 5

## 2013-06-28 MED ORDER — ONDANSETRON 16 MG/50ML IVPB (CHCC)
16.0000 mg | Freq: Once | INTRAVENOUS | Status: AC
  Administered 2013-06-28: 16 mg via INTRAVENOUS

## 2013-06-28 MED ORDER — SODIUM CHLORIDE 0.9 % IV SOLN
100.0000 mg/m2 | Freq: Once | INTRAVENOUS | Status: AC
  Administered 2013-06-28: 220 mg via INTRAVENOUS
  Filled 2013-06-28: qty 11

## 2013-06-28 MED ORDER — SODIUM CHLORIDE 0.9 % IV SOLN: Freq: Once | INTRAVENOUS | Status: DC

## 2013-06-28 MED ORDER — DEXAMETHASONE SODIUM PHOSPHATE 20 MG/5ML IJ SOLN
20.0000 mg | Freq: Once | INTRAMUSCULAR | Status: AC
  Administered 2013-06-28: 20 mg via INTRAVENOUS

## 2013-06-28 NOTE — Patient Instructions (Signed)
[facility] Discharge Instructions for Patients Receiving Chemotherapy  Today you received the following chemotherapy agents etoposide  To help prevent nausea and vomiting after your treatment, we encourage you to take your nausea medication as needed   If you develop nausea and vomiting that is not controlled by your nausea medication, call the clinic.   BELOW ARE SYMPTOMS THAT SHOULD BE REPORTED IMMEDIATELY:  *FEVER GREATER THAN 100.5 F  *CHILLS WITH OR WITHOUT FEVER  NAUSEA AND VOMITING THAT IS NOT CONTROLLED WITH YOUR NAUSEA MEDICATION  *UNUSUAL SHORTNESS OF BREATH  *UNUSUAL BRUISING OR BLEEDING  TENDERNESS IN MOUTH AND THROAT WITH OR WITHOUT PRESENCE OF ULCERS  *URINARY PROBLEMS  *BOWEL PROBLEMS  UNUSUAL RASH Items with * indicate a potential emergency and should be followed up as soon as possible.  Feel free to call the clinic you have any questions or concerns. The clinic phone number is [phone].    

## 2013-06-29 ENCOUNTER — Ambulatory Visit (HOSPITAL_BASED_OUTPATIENT_CLINIC_OR_DEPARTMENT_OTHER): Payer: BC Managed Care – PPO

## 2013-06-29 VITALS — BP 128/71 | HR 81 | Temp 98.3°F | Resp 18

## 2013-06-29 DIAGNOSIS — C8409 Mycosis fungoides, extranodal and solid organ sites: Secondary | ICD-10-CM

## 2013-06-29 DIAGNOSIS — Z5111 Encounter for antineoplastic chemotherapy: Secondary | ICD-10-CM

## 2013-06-29 DIAGNOSIS — C8590 Non-Hodgkin lymphoma, unspecified, unspecified site: Secondary | ICD-10-CM

## 2013-06-29 MED ORDER — SODIUM CHLORIDE 0.9 % IV SOLN
100.0000 mg/m2 | Freq: Once | INTRAVENOUS | Status: AC
  Administered 2013-06-29: 220 mg via INTRAVENOUS
  Filled 2013-06-29: qty 11

## 2013-06-29 MED ORDER — ONDANSETRON 16 MG/50ML IVPB (CHCC)
INTRAVENOUS | Status: AC
  Filled 2013-06-29: qty 16

## 2013-06-29 MED ORDER — SODIUM CHLORIDE 0.9 % IV SOLN
Freq: Once | INTRAVENOUS | Status: AC
  Administered 2013-06-29: 11:00:00 via INTRAVENOUS

## 2013-06-29 MED ORDER — DEXAMETHASONE SODIUM PHOSPHATE 20 MG/5ML IJ SOLN
20.0000 mg | Freq: Once | INTRAMUSCULAR | Status: AC
  Administered 2013-06-29: 20 mg via INTRAVENOUS

## 2013-06-29 MED ORDER — DEXAMETHASONE SODIUM PHOSPHATE 20 MG/5ML IJ SOLN
INTRAMUSCULAR | Status: AC
  Filled 2013-06-29: qty 5

## 2013-06-29 MED ORDER — HEPARIN SOD (PORK) LOCK FLUSH 100 UNIT/ML IV SOLN
500.0000 [IU] | Freq: Once | INTRAVENOUS | Status: AC | PRN
  Administered 2013-06-29: 500 [IU]
  Filled 2013-06-29: qty 5

## 2013-06-29 MED ORDER — SODIUM CHLORIDE 0.9 % IJ SOLN
10.0000 mL | INTRAMUSCULAR | Status: DC | PRN
  Administered 2013-06-29: 10 mL
  Filled 2013-06-29: qty 10

## 2013-06-29 MED ORDER — ONDANSETRON 16 MG/50ML IVPB (CHCC)
16.0000 mg | Freq: Once | INTRAVENOUS | Status: AC
  Administered 2013-06-29: 16 mg via INTRAVENOUS

## 2013-06-29 NOTE — Patient Instructions (Signed)
[facility] Discharge Instructions for Patients Receiving Chemotherapy  Today you received the following chemotherapy agents Etoposide.  To help prevent nausea and vomiting after your treatment, we encourage you to take your nausea medication.   If you develop nausea and vomiting that is not controlled by your nausea medication, call the clinic.   BELOW ARE SYMPTOMS THAT SHOULD BE REPORTED IMMEDIATELY:  *FEVER GREATER THAN 100.5 F  *CHILLS WITH OR WITHOUT FEVER  NAUSEA AND VOMITING THAT IS NOT CONTROLLED WITH YOUR NAUSEA MEDICATION  *UNUSUAL SHORTNESS OF BREATH  *UNUSUAL BRUISING OR BLEEDING  TENDERNESS IN MOUTH AND THROAT WITH OR WITHOUT PRESENCE OF ULCERS  *URINARY PROBLEMS  *BOWEL PROBLEMS  UNUSUAL RASH Items with * indicate a potential emergency and should be followed up as soon as possible.  Feel free to call the clinic you have any questions or concerns. The clinic phone number is [phone].    

## 2013-06-30 ENCOUNTER — Ambulatory Visit (HOSPITAL_BASED_OUTPATIENT_CLINIC_OR_DEPARTMENT_OTHER): Payer: BC Managed Care – PPO

## 2013-06-30 VITALS — BP 140/77 | HR 77 | Temp 97.7°F

## 2013-06-30 DIAGNOSIS — Z5189 Encounter for other specified aftercare: Secondary | ICD-10-CM

## 2013-06-30 DIAGNOSIS — C8409 Mycosis fungoides, extranodal and solid organ sites: Secondary | ICD-10-CM

## 2013-06-30 MED ORDER — PEGFILGRASTIM INJECTION 6 MG/0.6ML
6.0000 mg | Freq: Once | SUBCUTANEOUS | Status: AC
Start: 2013-06-30 — End: 2013-06-30
  Administered 2013-06-30: 6 mg via SUBCUTANEOUS

## 2013-07-18 ENCOUNTER — Ambulatory Visit (HOSPITAL_BASED_OUTPATIENT_CLINIC_OR_DEPARTMENT_OTHER): Payer: BC Managed Care – PPO

## 2013-07-18 ENCOUNTER — Other Ambulatory Visit (HOSPITAL_BASED_OUTPATIENT_CLINIC_OR_DEPARTMENT_OTHER): Payer: BC Managed Care – PPO

## 2013-07-18 ENCOUNTER — Telehealth: Payer: Self-pay | Admitting: Hematology and Oncology

## 2013-07-18 ENCOUNTER — Encounter: Payer: Self-pay | Admitting: Hematology and Oncology

## 2013-07-18 ENCOUNTER — Ambulatory Visit (HOSPITAL_BASED_OUTPATIENT_CLINIC_OR_DEPARTMENT_OTHER): Payer: BC Managed Care – PPO | Admitting: Hematology and Oncology

## 2013-07-18 VITALS — BP 131/86 | HR 89 | Temp 97.9°F | Resp 18 | Ht 68.0 in | Wt 227.8 lb

## 2013-07-18 DIAGNOSIS — C8590 Non-Hodgkin lymphoma, unspecified, unspecified site: Secondary | ICD-10-CM

## 2013-07-18 DIAGNOSIS — IMO0002 Reserved for concepts with insufficient information to code with codable children: Secondary | ICD-10-CM

## 2013-07-18 DIAGNOSIS — G622 Polyneuropathy due to other toxic agents: Secondary | ICD-10-CM

## 2013-07-18 DIAGNOSIS — T451X5A Adverse effect of antineoplastic and immunosuppressive drugs, initial encounter: Secondary | ICD-10-CM

## 2013-07-18 DIAGNOSIS — Z5111 Encounter for antineoplastic chemotherapy: Secondary | ICD-10-CM

## 2013-07-18 DIAGNOSIS — G620 Drug-induced polyneuropathy: Secondary | ICD-10-CM

## 2013-07-18 DIAGNOSIS — D630 Anemia in neoplastic disease: Secondary | ICD-10-CM

## 2013-07-18 LAB — CBC WITH DIFFERENTIAL/PLATELET
BASO%: 0.5 % (ref 0.0–2.0)
Basophils Absolute: 0 10*3/uL (ref 0.0–0.1)
EOS ABS: 0 10*3/uL (ref 0.0–0.5)
EOS%: 0.2 % (ref 0.0–7.0)
HCT: 31.9 % — ABNORMAL LOW (ref 34.8–46.6)
HGB: 10.5 g/dL — ABNORMAL LOW (ref 11.6–15.9)
LYMPH#: 0.7 10*3/uL — AB (ref 0.9–3.3)
LYMPH%: 16 % (ref 14.0–49.7)
MCH: 30.1 pg (ref 25.1–34.0)
MCHC: 32.9 g/dL (ref 31.5–36.0)
MCV: 91.4 fL (ref 79.5–101.0)
MONO#: 0.8 10*3/uL (ref 0.1–0.9)
MONO%: 19.4 % — ABNORMAL HIGH (ref 0.0–14.0)
NEUT%: 63.9 % (ref 38.4–76.8)
NEUTROS ABS: 2.6 10*3/uL (ref 1.5–6.5)
Platelets: 402 10*3/uL — ABNORMAL HIGH (ref 145–400)
RBC: 3.49 10*6/uL — ABNORMAL LOW (ref 3.70–5.45)
RDW: 16.9 % — AB (ref 11.2–14.5)
WBC: 4.1 10*3/uL (ref 3.9–10.3)
nRBC: 0 % (ref 0–0)

## 2013-07-18 LAB — COMPREHENSIVE METABOLIC PANEL (CC13)
ALT: 24 U/L (ref 0–55)
AST: 23 U/L (ref 5–34)
Albumin: 3.4 g/dL — ABNORMAL LOW (ref 3.5–5.0)
Alkaline Phosphatase: 115 U/L (ref 40–150)
Anion Gap: 14 mEq/L — ABNORMAL HIGH (ref 3–11)
BUN: 6.5 mg/dL — ABNORMAL LOW (ref 7.0–26.0)
CO2: 20 mEq/L — ABNORMAL LOW (ref 22–29)
Calcium: 9.2 mg/dL (ref 8.4–10.4)
Chloride: 109 mEq/L (ref 98–109)
Creatinine: 0.7 mg/dL (ref 0.6–1.1)
Glucose: 94 mg/dl (ref 70–140)
Potassium: 3.8 mEq/L (ref 3.5–5.1)
Sodium: 143 mEq/L (ref 136–145)
Total Bilirubin: 0.42 mg/dL (ref 0.20–1.20)
Total Protein: 6.3 g/dL — ABNORMAL LOW (ref 6.4–8.3)

## 2013-07-18 MED ORDER — ONDANSETRON 16 MG/50ML IVPB (CHCC)
INTRAVENOUS | Status: AC
  Filled 2013-07-18: qty 16

## 2013-07-18 MED ORDER — SODIUM CHLORIDE 0.9 % IV SOLN
100.0000 mg/m2 | Freq: Once | INTRAVENOUS | Status: AC
  Administered 2013-07-18: 220 mg via INTRAVENOUS
  Filled 2013-07-18: qty 11

## 2013-07-18 MED ORDER — ONDANSETRON 16 MG/50ML IVPB (CHCC)
16.0000 mg | Freq: Once | INTRAVENOUS | Status: AC
  Administered 2013-07-18: 16 mg via INTRAVENOUS

## 2013-07-18 MED ORDER — HEPARIN SOD (PORK) LOCK FLUSH 100 UNIT/ML IV SOLN
500.0000 [IU] | Freq: Once | INTRAVENOUS | Status: AC | PRN
  Administered 2013-07-18: 500 [IU]
  Filled 2013-07-18: qty 5

## 2013-07-18 MED ORDER — VINCRISTINE SULFATE CHEMO INJECTION 1 MG/ML
1.0000 mg | Freq: Once | INTRAVENOUS | Status: AC
  Administered 2013-07-18: 1 mg via INTRAVENOUS
  Filled 2013-07-18: qty 1

## 2013-07-18 MED ORDER — SODIUM CHLORIDE 0.9 % IV SOLN
Freq: Once | INTRAVENOUS | Status: AC
  Administered 2013-07-18: 11:00:00 via INTRAVENOUS

## 2013-07-18 MED ORDER — SODIUM CHLORIDE 0.9 % IJ SOLN
10.0000 mL | INTRAMUSCULAR | Status: DC | PRN
  Administered 2013-07-18: 10 mL
  Filled 2013-07-18: qty 10

## 2013-07-18 MED ORDER — DEXAMETHASONE SODIUM PHOSPHATE 20 MG/5ML IJ SOLN
20.0000 mg | Freq: Once | INTRAMUSCULAR | Status: AC
  Administered 2013-07-18: 20 mg via INTRAVENOUS

## 2013-07-18 MED ORDER — DEXAMETHASONE SODIUM PHOSPHATE 20 MG/5ML IJ SOLN
INTRAMUSCULAR | Status: AC
  Filled 2013-07-18: qty 5

## 2013-07-18 MED ORDER — SODIUM CHLORIDE 0.9 % IV SOLN
750.0000 mg/m2 | Freq: Once | INTRAVENOUS | Status: AC
  Administered 2013-07-18: 1680 mg via INTRAVENOUS
  Filled 2013-07-18: qty 84

## 2013-07-18 MED ORDER — DOXORUBICIN HCL CHEMO IV INJECTION 2 MG/ML
50.0000 mg/m2 | Freq: Once | INTRAVENOUS | Status: AC
  Administered 2013-07-18: 112 mg via INTRAVENOUS
  Filled 2013-07-18: qty 56

## 2013-07-18 NOTE — Patient Instructions (Signed)
[facility] Discharge Instructions for Patients Receiving Chemotherapy  Today you received the following chemotherapy agents Etoposide, Adriamycin, Vincristine, Cytoxan  To help prevent nausea and vomiting after your treatment, we encourage you to take your nausea medication as prescribed.   If you develop nausea and vomiting that is not controlled by your nausea medication, call the clinic.   BELOW ARE SYMPTOMS THAT SHOULD BE REPORTED IMMEDIATELY:  *FEVER GREATER THAN 100.5 F  *CHILLS WITH OR WITHOUT FEVER  NAUSEA AND VOMITING THAT IS NOT CONTROLLED WITH YOUR NAUSEA MEDICATION  *UNUSUAL SHORTNESS OF BREATH  *UNUSUAL BRUISING OR BLEEDING  TENDERNESS IN MOUTH AND THROAT WITH OR WITHOUT PRESENCE OF ULCERS  *URINARY PROBLEMS  *BOWEL PROBLEMS  UNUSUAL RASH Items with * indicate a potential emergency and should be followed up as soon as possible.  Feel free to call the clinic you have any questions or concerns. The clinic phone number is [phone].

## 2013-07-18 NOTE — Assessment & Plan Note (Signed)
This is stable. I will continue with reduced dose vincristine.

## 2013-07-18 NOTE — Telephone Encounter (Signed)
gv adn printed appt sched and avs for pt .[facility]..pt needed to r/s due to being out of town

## 2013-07-18 NOTE — Telephone Encounter (Signed)
gv pt appt schedule for june/july. central radiology scheduling will call pt w/pet scan appt - pt aware.

## 2013-07-18 NOTE — Progress Notes (Signed)
[place] OFFICE PROGRESS NOTE  Patient Care Team: Rosalita Chessman, DO as PCP - General [name], MD as Consulting Physician (General Surgery) Heath Lark, MD as Consulting Physician (Hematology and Oncology)  SUMMARY OF ONCOLOGIC HISTORY: Oncology History   Anaplastic Lymphoma, T-cell, 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)   Staging method: AJCC 7th Edition   Clinical: Stage II signed by Heath Lark, MD on 04/03/2013  3:31 PM   Summary: Stage II      Lymphoma, T-cell   02/07/2013 Imaging [place] confirmed enlarged LN   03/09/2013 Procedure LN biopsy confirmed T cell lymphoma   03/23/2013 Procedure She has port placement   03/26/2013 Imaging Echo showed normal ejection fraction   03/29/2013 Bone Marrow Biopsy Bone marrow biopsy was negative   04/02/2013 Imaging PET/CT scan showed localized, stage II disease   04/04/2013 -  Chemotherapy The patient is started on cycle 1 of CHOP plus etoposide chemotherapy   06/01/2013 Imaging PET CT scan show complete response to treatment    INTERVAL HISTORY: Please see below for problem oriented charting. She is seen prior to cycle 6 of treatment. She has minimal side effects from prior treatment. She denies any worsening peripheral neuropathy.  REVIEW OF SYSTEMS:   Constitutional: Denies fevers, chills or abnormal weight loss Eyes: Denies blurriness of vision Ears, nose, mouth, throat, and face: Denies mucositis or sore throat Respiratory: Denies cough, dyspnea or wheezes Cardiovascular: Denies palpitation, chest discomfort or lower extremity swelling Gastrointestinal:  Denies nausea, heartburn or change in bowel habits Skin: Denies abnormal skin rashes Lymphatics: Denies new lymphadenopathy or easy bruising Neurological:Denies numbness, tingling or new weaknesses Behavioral/Psych: Mood is stable, no new changes  All other systems were reviewed with the patient and are negative.  I have reviewed  the past medical history, past surgical history, social history and family history with the patient and they are unchanged from previous note.  ALLERGIES:  is allergic to betadine and codeine.  MEDICATIONS:  Current Outpatient Prescriptions  Medication Sig Dispense Refill  . buPROPion (WELLBUTRIN XL) 150 MG 24 hr tablet Take 1 tablet (150 mg total) by mouth daily.  90 tablet  1  . butalbital-acetaminophen-caffeine (FIORICET) 50-325-40 MG per tablet Take 1 tablet by mouth every 6 (six) hours as needed for headache.  30 tablet  0  . docusate sodium (COLACE) 100 MG capsule Take 100 mg by mouth daily.      Marland Kitchen FLUoxetine (PROZAC) 40 MG capsule TAKE 1 CAPSULE BY MOUTH DAILY  30 capsule  5  . lidocaine-prilocaine (EMLA) cream Apply 1 application topically as needed.  30 g  0  . omeprazole (PRILOSEC OTC) 20 MG tablet Take 20 mg by mouth daily.      . ondansetron (ZOFRAN) 8 MG tablet Take 1 tablet (8 mg total) by mouth 2 (two) times daily. Start the day after chemo for 3 days. Then as needed for nausea or vomiting.  30 tablet  1  . predniSONE (DELTASONE) 20 MG tablet Take 3 tablets (60 mg total) by mouth daily. Take on days 1-5 of chemotherapy.  90 tablet  0  . prochlorperazine (COMPAZINE) 10 MG tablet Take 1 tablet (10 mg total) by mouth every 6 (six) hours as needed (Nausea or vomiting).  30 tablet  6   No current facility-administered medications for this visit.   Facility-Administered Medications Ordered in Other Visits  Medication Dose Route Frequency Provider Last Rate Last Dose  . sodium  chloride 0.9 % injection 10 mL  10 mL Intracatheter PRN Heath Lark, MD   10 mL at 06/06/13 1407    PHYSICAL EXAMINATION: ECOG PERFORMANCE STATUS: 1 - Symptomatic but completely ambulatory  Filed Vitals:   07/18/13 1019  BP: 131/86  Pulse: 89  Temp: 97.9 F (36.6 C)  Resp: 18   Filed Weights   07/18/13 1019  Weight: 227 lb 12.8 oz (103.329 kg)    GENERAL:alert, no distress and comfortable. She is  morbidly obese SKIN: skin color, texture, turgor are normal, no rashes or significant lesions EYES: normal, Conjunctiva are pink and non-injected, sclera clear OROPHARYNX:no exudate, no erythema and lips, buccal mucosa, and tongue normal  NECK: supple, thyroid normal size, non-tender, without nodularity LYMPH:  no palpable lymphadenopathy in the cervical, axillary or inguinal LUNGS: clear to auscultation and percussion with normal breathing effort HEART: regular rate & rhythm and no murmurs and no lower extremity edema ABDOMEN:abdomen soft, non-tender and normal bowel sounds Musculoskeletal:no cyanosis of digits and no clubbing  NEURO: alert & oriented x 3 with fluent speech, no focal motor/sensory deficits  LABORATORY DATA:  I have reviewed the data as listed    Component Value Date/Time   NA 143 07/18/2013 1004   NA 140 04/03/2013 1328   K 3.8 07/18/2013 1004   K 4.1 04/03/2013 1328   CL 100 04/03/2013 1328   CO2 20* 07/18/2013 1004   CO2 25 04/03/2013 1328   GLUCOSE 94 07/18/2013 1004   GLUCOSE 85 04/03/2013 1328   BUN 6.5* 07/18/2013 1004   BUN 13 04/03/2013 1328   CREATININE 0.7 07/18/2013 1004   CREATININE 0.87 04/03/2013 1328   CALCIUM 9.2 07/18/2013 1004   CALCIUM 9.1 04/03/2013 1328   PROT 6.3* 07/18/2013 1004   PROT 6.2 04/03/2013 1328   ALBUMIN 3.4* 07/18/2013 1004   ALBUMIN 3.9 04/03/2013 1328   AST 23 07/18/2013 1004   AST 16 04/03/2013 1328   ALT 24 07/18/2013 1004   ALT 18 04/03/2013 1328   ALKPHOS 115 07/18/2013 1004   ALKPHOS 164* 04/03/2013 1328   BILITOT 0.42 07/18/2013 1004   BILITOT 0.5 04/03/2013 1328   GFRNONAA 77* 03/23/2013 0953   GFRAA 90* 03/23/2013 0953    No results found for this basename: SPEP, UPEP,  kappa and lambda light chains    Lab Results  Component Value Date   WBC 4.1 07/18/2013   NEUTROABS 2.6 07/18/2013   HGB 10.5* 07/18/2013   HCT 31.9* 07/18/2013   MCV 91.4 07/18/2013   PLT 402* 07/18/2013      Chemistry      Component Value Date/Time   NA 143 07/18/2013 1004   NA  140 04/03/2013 1328   K 3.8 07/18/2013 1004   K 4.1 04/03/2013 1328   CL 100 04/03/2013 1328   CO2 20* 07/18/2013 1004   CO2 25 04/03/2013 1328   BUN 6.5* 07/18/2013 1004   BUN 13 04/03/2013 1328   CREATININE 0.7 07/18/2013 1004   CREATININE 0.87 04/03/2013 1328      Component Value Date/Time   CALCIUM 9.2 07/18/2013 1004   CALCIUM 9.1 04/03/2013 1328   ALKPHOS 115 07/18/2013 1004   ALKPHOS 164* 04/03/2013 1328   AST 23 07/18/2013 1004   AST 16 04/03/2013 1328   ALT 24 07/18/2013 1004   ALT 18 04/03/2013 1328   BILITOT 0.42 07/18/2013 1004   BILITOT 0.5 04/03/2013 1328      ASSESSMENT & PLAN:  Lymphoma, T-cell She tolerated her last  cycle of treatment well without any side effects. I would proceed with her final treatment today without delay. I will reorder PET/CT scan to be done in 6 weeks and I shall review test results with her.  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 I will continue with reduced dose vincristine.  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 The patient denies recent history of bleeding such as epistaxis, hematuria or hematochezia. She is asymptomatic from the anemia. I will observe for now.  She does not require transfusion now. I will continue the chemotherapy at current dose without dosage adjustment.  If the anemia gets progressive worse in the future, I might have to delay her treatment or adjust the chemotherapy dose.    Orders Placed This Encounter  Procedures  . NM PET Image Restag (PS) Skull Base To Thigh    Standing Status: Future     Number of Occurrences:      Standing Expiration Date: 09/17/2014    Order Specific Question:  Reason for Exam (SYMPTOM  OR DIAGNOSIS REQUIRED)    Answer:  staging lymphoma assess response to Rx    Order Specific Question:  Is the patient pregnant?    Answer:  No    Order Specific Question:  Preferred imaging location?    Answer:  [facility]   All questions were answered. The patient  knows to call the clinic with any problems, questions or concerns. No barriers to learning was detected.    Heath Lark, MD 07/18/2013 8:23 PM

## 2013-07-18 NOTE — Progress Notes (Signed)
@  1208: Positive blood return noted before, during, and after Adriamycin injection per protocol.

## 2013-07-18 NOTE — Assessment & Plan Note (Signed)

## 2013-07-18 NOTE — Assessment & Plan Note (Signed)
She tolerated her last cycle of treatment well without any side effects. I would proceed with her final treatment today without delay. I will reorder PET/CT scan to be done in 6 weeks and I shall review test results with her.

## 2013-07-19 ENCOUNTER — Ambulatory Visit (HOSPITAL_BASED_OUTPATIENT_CLINIC_OR_DEPARTMENT_OTHER): Payer: BC Managed Care – PPO

## 2013-07-19 VITALS — BP 130/83 | HR 72 | Temp 97.7°F | Resp 18

## 2013-07-19 DIAGNOSIS — C8590 Non-Hodgkin lymphoma, unspecified, unspecified site: Secondary | ICD-10-CM

## 2013-07-19 DIAGNOSIS — Z5111 Encounter for antineoplastic chemotherapy: Secondary | ICD-10-CM

## 2013-07-19 DIAGNOSIS — IMO0002 Reserved for concepts with insufficient information to code with codable children: Secondary | ICD-10-CM

## 2013-07-19 MED ORDER — SODIUM CHLORIDE 0.9 % IV SOLN
Freq: Once | INTRAVENOUS | Status: AC
  Administered 2013-07-19: 12:00:00 via INTRAVENOUS

## 2013-07-19 MED ORDER — SODIUM CHLORIDE 0.9 % IV SOLN
100.0000 mg/m2 | Freq: Once | INTRAVENOUS | Status: AC
  Administered 2013-07-19: 220 mg via INTRAVENOUS
  Filled 2013-07-19: qty 11

## 2013-07-19 MED ORDER — HEPARIN SOD (PORK) LOCK FLUSH 100 UNIT/ML IV SOLN
500.0000 [IU] | Freq: Once | INTRAVENOUS | Status: AC | PRN
  Administered 2013-07-19: 500 [IU]
  Filled 2013-07-19: qty 5

## 2013-07-19 MED ORDER — ONDANSETRON 16 MG/50ML IVPB (CHCC)
16.0000 mg | Freq: Once | INTRAVENOUS | Status: AC
  Administered 2013-07-19: 16 mg via INTRAVENOUS

## 2013-07-19 MED ORDER — DEXAMETHASONE SODIUM PHOSPHATE 20 MG/5ML IJ SOLN
20.0000 mg | Freq: Once | INTRAMUSCULAR | Status: AC
  Administered 2013-07-19: 20 mg via INTRAVENOUS

## 2013-07-19 MED ORDER — ONDANSETRON 16 MG/50ML IVPB (CHCC)
INTRAVENOUS | Status: AC
  Filled 2013-07-19: qty 16

## 2013-07-19 MED ORDER — SODIUM CHLORIDE 0.9 % IJ SOLN
10.0000 mL | INTRAMUSCULAR | Status: DC | PRN
  Administered 2013-07-19: 10 mL
  Filled 2013-07-19: qty 10

## 2013-07-19 MED ORDER — DEXAMETHASONE SODIUM PHOSPHATE 20 MG/5ML IJ SOLN
INTRAMUSCULAR | Status: AC
  Filled 2013-07-19: qty 5

## 2013-07-19 NOTE — Patient Instructions (Signed)
[facility] Discharge Instructions for Patients Receiving Chemotherapy  Today you received the following chemotherapy agents: etoposide  To help prevent nausea and vomiting after your treatment, we encourage you to take your nausea medication as prescribed.    If you develop nausea and vomiting that is not controlled by your nausea medication, call the clinic.   BELOW ARE SYMPTOMS THAT SHOULD BE REPORTED IMMEDIATELY:  *FEVER GREATER THAN 100.5 F  *CHILLS WITH OR WITHOUT FEVER  NAUSEA AND VOMITING THAT IS NOT CONTROLLED WITH YOUR NAUSEA MEDICATION  *UNUSUAL SHORTNESS OF BREATH  *UNUSUAL BRUISING OR BLEEDING  TENDERNESS IN MOUTH AND THROAT WITH OR WITHOUT PRESENCE OF ULCERS  *URINARY PROBLEMS  *BOWEL PROBLEMS  UNUSUAL RASH Items with * indicate a potential emergency and should be followed up as soon as possible.  Feel free to call the clinic you have any questions or concerns. The clinic phone number is [phone].    

## 2013-07-20 ENCOUNTER — Ambulatory Visit (HOSPITAL_BASED_OUTPATIENT_CLINIC_OR_DEPARTMENT_OTHER): Payer: BC Managed Care – PPO

## 2013-07-20 VITALS — BP 122/77 | HR 80 | Temp 97.7°F | Resp 18

## 2013-07-20 DIAGNOSIS — C8590 Non-Hodgkin lymphoma, unspecified, unspecified site: Secondary | ICD-10-CM

## 2013-07-20 DIAGNOSIS — Z5111 Encounter for antineoplastic chemotherapy: Secondary | ICD-10-CM

## 2013-07-20 DIAGNOSIS — IMO0002 Reserved for concepts with insufficient information to code with codable children: Secondary | ICD-10-CM

## 2013-07-20 MED ORDER — SODIUM CHLORIDE 0.9 % IV SOLN
Freq: Once | INTRAVENOUS | Status: AC
  Administered 2013-07-20: 12:00:00 via INTRAVENOUS

## 2013-07-20 MED ORDER — SODIUM CHLORIDE 0.9 % IV SOLN
100.0000 mg/m2 | Freq: Once | INTRAVENOUS | Status: AC
  Administered 2013-07-20: 220 mg via INTRAVENOUS
  Filled 2013-07-20: qty 11

## 2013-07-20 MED ORDER — ONDANSETRON 16 MG/50ML IVPB (CHCC)
16.0000 mg | Freq: Once | INTRAVENOUS | Status: AC
  Administered 2013-07-20: 16 mg via INTRAVENOUS

## 2013-07-20 MED ORDER — DEXAMETHASONE SODIUM PHOSPHATE 20 MG/5ML IJ SOLN
20.0000 mg | Freq: Once | INTRAMUSCULAR | Status: AC
  Administered 2013-07-20: 20 mg via INTRAVENOUS

## 2013-07-20 MED ORDER — ONDANSETRON 16 MG/50ML IVPB (CHCC)
INTRAVENOUS | Status: AC
  Filled 2013-07-20: qty 16

## 2013-07-20 MED ORDER — SODIUM CHLORIDE 0.9 % IJ SOLN
10.0000 mL | INTRAMUSCULAR | Status: DC | PRN
  Administered 2013-07-20: 10 mL
  Filled 2013-07-20: qty 10

## 2013-07-20 MED ORDER — HEPARIN SOD (PORK) LOCK FLUSH 100 UNIT/ML IV SOLN
500.0000 [IU] | Freq: Once | INTRAVENOUS | Status: AC | PRN
  Administered 2013-07-20: 500 [IU]
  Filled 2013-07-20: qty 5

## 2013-07-20 MED ORDER — DEXAMETHASONE SODIUM PHOSPHATE 20 MG/5ML IJ SOLN
INTRAMUSCULAR | Status: AC
  Filled 2013-07-20: qty 5

## 2013-07-20 NOTE — Patient Instructions (Signed)
[facility] Discharge Instructions for Patients Receiving Chemotherapy  Today you received the following chemotherapy agents; Etoposide (VP-16)  To help prevent nausea and vomiting after your treatment, we encourage you to take your nausea medication as directed.    If you develop nausea and vomiting that is not controlled by your nausea medication, call the clinic.   BELOW ARE SYMPTOMS THAT SHOULD BE REPORTED IMMEDIATELY:  *FEVER GREATER THAN 100.5 F  *CHILLS WITH OR WITHOUT FEVER  NAUSEA AND VOMITING THAT IS NOT CONTROLLED WITH YOUR NAUSEA MEDICATION  *UNUSUAL SHORTNESS OF BREATH  *UNUSUAL BRUISING OR BLEEDING  TENDERNESS IN MOUTH AND THROAT WITH OR WITHOUT PRESENCE OF ULCERS  *URINARY PROBLEMS  *BOWEL PROBLEMS  UNUSUAL RASH Items with * indicate a potential emergency and should be followed up as soon as possible.  Feel free to call the clinic you have any questions or concerns. The clinic phone number is [phone].  CONGRATULATIONS!!! YOU DID IT!!!  WOOOOOO-HOOOOOOO!

## 2013-07-21 ENCOUNTER — Ambulatory Visit (HOSPITAL_BASED_OUTPATIENT_CLINIC_OR_DEPARTMENT_OTHER): Payer: BC Managed Care – PPO

## 2013-07-21 VITALS — BP 136/88 | HR 65 | Temp 98.1°F

## 2013-07-21 DIAGNOSIS — C8590 Non-Hodgkin lymphoma, unspecified, unspecified site: Secondary | ICD-10-CM

## 2013-07-21 DIAGNOSIS — Z5189 Encounter for other specified aftercare: Secondary | ICD-10-CM

## 2013-07-21 DIAGNOSIS — IMO0002 Reserved for concepts with insufficient information to code with codable children: Secondary | ICD-10-CM

## 2013-07-21 MED ORDER — PEGFILGRASTIM INJECTION 6 MG/0.6ML
6.0000 mg | Freq: Once | SUBCUTANEOUS | Status: AC
  Administered 2013-07-21: 6 mg via SUBCUTANEOUS

## 2013-07-21 NOTE — Patient Instructions (Signed)

## 2013-07-23 ENCOUNTER — Telehealth: Payer: Self-pay | Admitting: Hematology and Oncology

## 2013-07-23 NOTE — Telephone Encounter (Signed)
s.w. pt and advised on time change of appt on 7.17.Marland KitchenMarland KitchenMarland KitchenMarland Kitchenpt ok adn aware

## 2013-08-29 ENCOUNTER — Other Ambulatory Visit: Payer: BC Managed Care – PPO

## 2013-08-29 ENCOUNTER — Ambulatory Visit (HOSPITAL_COMMUNITY): Payer: BC Managed Care – PPO

## 2013-08-30 ENCOUNTER — Ambulatory Visit: Payer: BC Managed Care – PPO | Admitting: Hematology and Oncology

## 2013-08-31 ENCOUNTER — Encounter (HOSPITAL_COMMUNITY)
Admission: RE | Admit: 2013-08-31 | Discharge: 2013-08-31 | Disposition: A | Discharge status: Home / Self Care | Payer: BC Managed Care – PPO | Source: Ambulatory Visit | Attending: Hematology and Oncology | Admitting: Hematology and Oncology

## 2013-08-31 ENCOUNTER — Ambulatory Visit: Payer: BC Managed Care – PPO

## 2013-08-31 ENCOUNTER — Other Ambulatory Visit (HOSPITAL_BASED_OUTPATIENT_CLINIC_OR_DEPARTMENT_OTHER): Payer: BC Managed Care – PPO

## 2013-08-31 ENCOUNTER — Other Ambulatory Visit: Payer: BC Managed Care – PPO

## 2013-08-31 VITALS — BP 146/87 | HR 70

## 2013-08-31 DIAGNOSIS — IMO0002 Reserved for concepts with insufficient information to code with codable children: Secondary | ICD-10-CM

## 2013-08-31 DIAGNOSIS — C8590 Non-Hodgkin lymphoma, unspecified, unspecified site: Secondary | ICD-10-CM

## 2013-08-31 DIAGNOSIS — Z95828 Presence of other vascular implants and grafts: Secondary | ICD-10-CM

## 2013-08-31 DIAGNOSIS — C8409 Mycosis fungoides, extranodal and solid organ sites: Secondary | ICD-10-CM | POA: Insufficient documentation

## 2013-08-31 LAB — CBC WITH DIFFERENTIAL/PLATELET
BASO%: 0.8 % (ref 0.0–2.0)
Basophils Absolute: 0 10*3/uL (ref 0.0–0.1)
EOS%: 2.8 % (ref 0.0–7.0)
Eosinophils Absolute: 0.1 10*3/uL (ref 0.0–0.5)
HCT: 35.3 % (ref 34.8–46.6)
HGB: 11.5 g/dL — ABNORMAL LOW (ref 11.6–15.9)
LYMPH%: 11.4 % — AB (ref 14.0–49.7)
MCH: 29 pg (ref 25.1–34.0)
MCHC: 32.7 g/dL (ref 31.5–36.0)
MCV: 88.9 fL (ref 79.5–101.0)
MONO#: 0.5 10*3/uL (ref 0.1–0.9)
MONO%: 9.9 % (ref 0.0–14.0)
NEUT#: 3.8 10*3/uL (ref 1.5–6.5)
NEUT%: 75.1 % (ref 38.4–76.8)
PLATELETS: 283 10*3/uL (ref 145–400)
RBC: 3.97 10*6/uL (ref 3.70–5.45)
RDW: 15.1 % — ABNORMAL HIGH (ref 11.2–14.5)
WBC: 5 10*3/uL (ref 3.9–10.3)
lymph#: 0.6 10*3/uL — ABNORMAL LOW (ref 0.9–3.3)

## 2013-08-31 LAB — COMPREHENSIVE METABOLIC PANEL (CC13)
ALT: 28 U/L (ref 0–55)
ANION GAP: 8 meq/L (ref 3–11)
AST: 26 U/L (ref 5–34)
Albumin: 3.6 g/dL (ref 3.5–5.0)
Alkaline Phosphatase: 143 U/L (ref 40–150)
BUN: 8 mg/dL (ref 7.0–26.0)
CALCIUM: 9.5 mg/dL (ref 8.4–10.4)
CHLORIDE: 107 meq/L (ref 98–109)
CO2: 26 mEq/L (ref 22–29)
CREATININE: 0.7 mg/dL (ref 0.6–1.1)
GLUCOSE: 96 mg/dL (ref 70–140)
Potassium: 3.7 mEq/L (ref 3.5–5.1)
Sodium: 141 mEq/L (ref 136–145)
Total Bilirubin: 0.66 mg/dL (ref 0.20–1.20)
Total Protein: 6.5 g/dL (ref 6.4–8.3)

## 2013-08-31 MED ORDER — FLUDEOXYGLUCOSE F - 18 (FDG) INJECTION: 11.0000 | Freq: Once | INTRAVENOUS | Status: AC | PRN

## 2013-08-31 MED ORDER — HEPARIN SOD (PORK) LOCK FLUSH 100 UNIT/ML IV SOLN
500.0000 [IU] | Freq: Once | INTRAVENOUS | Status: AC
  Administered 2013-08-31: 500 [IU] via INTRAVENOUS
  Filled 2013-08-31: qty 5

## 2013-08-31 MED ORDER — SODIUM CHLORIDE 0.9 % IJ SOLN
10.0000 mL | INTRAMUSCULAR | Status: DC | PRN
  Administered 2013-08-31: 10 mL via INTRAVENOUS
  Filled 2013-08-31: qty 10

## 2013-09-03 ENCOUNTER — Encounter: Payer: Self-pay | Admitting: Hematology and Oncology

## 2013-09-03 ENCOUNTER — Ambulatory Visit (HOSPITAL_BASED_OUTPATIENT_CLINIC_OR_DEPARTMENT_OTHER): Payer: BC Managed Care – PPO | Admitting: Hematology and Oncology

## 2013-09-03 VITALS — BP 148/78 | HR 71 | Temp 98.5°F | Resp 18 | Ht 68.0 in | Wt 224.8 lb

## 2013-09-03 DIAGNOSIS — T451X5A Adverse effect of antineoplastic and immunosuppressive drugs, initial encounter: Secondary | ICD-10-CM

## 2013-09-03 DIAGNOSIS — I10 Essential (primary) hypertension: Secondary | ICD-10-CM

## 2013-09-03 DIAGNOSIS — G620 Drug-induced polyneuropathy: Secondary | ICD-10-CM

## 2013-09-03 DIAGNOSIS — IMO0002 Reserved for concepts with insufficient information to code with codable children: Secondary | ICD-10-CM

## 2013-09-03 DIAGNOSIS — G622 Polyneuropathy due to other toxic agents: Secondary | ICD-10-CM

## 2013-09-03 DIAGNOSIS — C8590 Non-Hodgkin lymphoma, unspecified, unspecified site: Secondary | ICD-10-CM

## 2013-09-03 DIAGNOSIS — D630 Anemia in neoplastic disease: Secondary | ICD-10-CM

## 2013-09-03 DIAGNOSIS — R5381 Other malaise: Secondary | ICD-10-CM | POA: Insufficient documentation

## 2013-09-03 DIAGNOSIS — L659 Nonscarring hair loss, unspecified: Secondary | ICD-10-CM

## 2013-09-03 LAB — GLUCOSE, CAPILLARY: Glucose-Capillary: 99 mg/dL (ref 70–99)

## 2013-09-03 NOTE — Assessment & Plan Note (Addendum)
I filled out application for disability placard for 6 months. I encouraged her to exercise as tolerated.

## 2013-09-03 NOTE — Assessment & Plan Note (Signed)
This is improving. I recommend observation only.

## 2013-09-03 NOTE — Assessment & Plan Note (Signed)
I recommend resumption of one of her blood pressure medications.

## 2013-09-03 NOTE — Assessment & Plan Note (Signed)
Clinically, she has no evidence of recurrence of disease. She has achieved complete response. I will consult her surgeon to have the port removed. I will see her back in 3 months with history, physical examination and blood work. I educated the patient's signs and symptoms to watch out for disease recurrence. I will not order routine imaging study unless she has evidence to suggest disease recurrence.

## 2013-09-03 NOTE — Progress Notes (Signed)
[facility] OFFICE PROGRESS NOTE  Patient Care Team: Rosalita Chessman, DO as PCP - General [name], MD as Consulting Physician (General Surgery) Heath Lark, MD as Consulting Physician (Hematology and Oncology)  SUMMARY OF ONCOLOGIC HISTORY: Oncology History   Anaplastic Lymphoma, T-cell, 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)   Staging method: AJCC 7th Edition   Clinical: Stage II signed by Heath Lark, MD on 04/03/2013  3:31 PM   Summary: Stage II      Lymphoma, T-cell   02/07/2013 Imaging [place] confirmed enlarged LN   03/09/2013 Procedure LN biopsy confirmed T cell lymphoma   03/23/2013 Procedure She has port placement   03/26/2013 Imaging Echo showed normal ejection fraction   03/29/2013 Bone Marrow Biopsy Bone marrow biopsy was negative   04/02/2013 Imaging PET/CT scan showed localized, stage II disease   04/04/2013 - 07/20/2013 Chemotherapy She has completed 6 cycles of CHOP plus etoposide chemotherapy.   06/01/2013 Imaging PET CT scan show complete response to treatment   06/27/2013 Adverse Reaction Dose of vincristine was reduced due to peripheral neuropathy.   08/31/2013 Imaging Repeat PET scans show no evidence of disease.    INTERVAL HISTORY: Please see below for problem oriented charting. She returns for further followup of her T-cell lymphoma. She denies new lymphadenopathy. She still has alopecia. Her peripheral neuropathy is improving. She denies recent headache.  REVIEW OF SYSTEMS:   Constitutional: Denies fevers, chills or abnormal weight loss Eyes: Denies blurriness of vision Ears, nose, mouth, throat, and face: Denies mucositis or sore throat Respiratory: Denies cough, dyspnea or wheezes Cardiovascular: Denies palpitation, chest discomfort or lower extremity swelling Gastrointestinal:  Denies nausea, heartburn or change in bowel habits Skin: Denies abnormal skin rashes Lymphatics: Denies new lymphadenopathy or easy  bruising Neurological:Denies numbness, tingling or new weaknesses Behavioral/Psych: Mood is stable, no new changes  All other systems were reviewed with the patient and are negative.  I have reviewed the past medical history, past surgical history, social history and family history with the patient and they are unchanged from previous note.  ALLERGIES:  is allergic to betadine and codeine.  MEDICATIONS:  Current Outpatient Prescriptions  Medication Sig Dispense Refill  . buPROPion (WELLBUTRIN XL) 150 MG 24 hr tablet Take 1 tablet (150 mg total) by mouth daily.  90 tablet  1  . butalbital-acetaminophen-caffeine (FIORICET) 50-325-40 MG per tablet Take 1 tablet by mouth every 6 (six) hours as needed for headache.  30 tablet  0  . docusate sodium (COLACE) 100 MG capsule Take 100 mg by mouth daily.      Marland Kitchen FLUoxetine (PROZAC) 40 MG capsule TAKE 1 CAPSULE BY MOUTH DAILY  30 capsule  5  . lidocaine-prilocaine (EMLA) cream Apply 1 application topically as needed.  30 g  0  . omeprazole (PRILOSEC OTC) 20 MG tablet Take 20 mg by mouth daily.      . ondansetron (ZOFRAN) 8 MG tablet Take 1 tablet (8 mg total) by mouth 2 (two) times daily. Start the day after chemo for 3 days. Then as needed for nausea or vomiting.  30 tablet  1  . predniSONE (DELTASONE) 20 MG tablet Take 3 tablets (60 mg total) by mouth daily. Take on days 1-5 of chemotherapy.  90 tablet  0  . prochlorperazine (COMPAZINE) 10 MG tablet Take 1 tablet (10 mg total) by mouth every 6 (six) hours as needed (Nausea or vomiting).  30 tablet  6   No  current facility-administered medications for this visit.   Facility-Administered Medications Ordered in Other Visits  Medication Dose Route Frequency Provider Last Rate Last Dose  . sodium chloride 0.9 % injection 10 mL  10 mL Intracatheter PRN Heath Lark, MD   10 mL at 06/06/13 1407    PHYSICAL EXAMINATION: ECOG PERFORMANCE STATUS: 0 - Asymptomatic  Filed Vitals:   09/03/13 1512  BP: 148/78   Pulse: 71  Temp: 98.5 F (36.9 C)  Resp: 18   Filed Weights   09/03/13 1512  Weight: 224 lb 12.8 oz (101.969 kg)    GENERAL:alert, no distress and comfortable. She is morbidly obese SKIN: skin color, texture, turgor are normal, no rashes or significant lesions EYES: normal, Conjunctiva are pink and non-injected, sclera clear OROPHARYNX:no exudate, no erythema and lips, buccal mucosa, and tongue normal  Musculoskeletal:no cyanosis of digits and no clubbing  NEURO: alert & oriented x 3 with fluent speech, no focal motor/sensory deficits  LABORATORY DATA:  I have reviewed the data as listed    Component Value Date/Time   NA 141 08/31/2013 1009   NA 140 04/03/2013 1328   K 3.7 08/31/2013 1009   K 4.1 04/03/2013 1328   CL 100 04/03/2013 1328   CO2 26 08/31/2013 1009   CO2 25 04/03/2013 1328   GLUCOSE 96 08/31/2013 1009   GLUCOSE 85 04/03/2013 1328   BUN 8.0 08/31/2013 1009   BUN 13 04/03/2013 1328   CREATININE 0.7 08/31/2013 1009   CREATININE 0.87 04/03/2013 1328   CALCIUM 9.5 08/31/2013 1009   CALCIUM 9.1 04/03/2013 1328   PROT 6.5 08/31/2013 1009   PROT 6.2 04/03/2013 1328   ALBUMIN 3.6 08/31/2013 1009   ALBUMIN 3.9 04/03/2013 1328   AST 26 08/31/2013 1009   AST 16 04/03/2013 1328   ALT 28 08/31/2013 1009   ALT 18 04/03/2013 1328   ALKPHOS 143 08/31/2013 1009   ALKPHOS 164* 04/03/2013 1328   BILITOT 0.66 08/31/2013 1009   BILITOT 0.5 04/03/2013 1328   GFRNONAA 77* 03/23/2013 0953   GFRAA 90* 03/23/2013 0953    No results found for this basename: SPEP, UPEP,  kappa and lambda light chains    Lab Results  Component Value Date   WBC 5.0 08/31/2013   NEUTROABS 3.8 08/31/2013   HGB 11.5* 08/31/2013   HCT 35.3 08/31/2013   MCV 88.9 08/31/2013   PLT 283 08/31/2013      Chemistry      Component Value Date/Time   NA 141 08/31/2013 1009   NA 140 04/03/2013 1328   K 3.7 08/31/2013 1009   K 4.1 04/03/2013 1328   CL 100 04/03/2013 1328   CO2 26 08/31/2013 1009   CO2 25 04/03/2013 1328   BUN 8.0  08/31/2013 1009   BUN 13 04/03/2013 1328   CREATININE 0.7 08/31/2013 1009   CREATININE 0.87 04/03/2013 1328      Component Value Date/Time   CALCIUM 9.5 08/31/2013 1009   CALCIUM 9.1 04/03/2013 1328   ALKPHOS 143 08/31/2013 1009   ALKPHOS 164* 04/03/2013 1328   AST 26 08/31/2013 1009   AST 16 04/03/2013 1328   ALT 28 08/31/2013 1009   ALT 18 04/03/2013 1328   BILITOT 0.66 08/31/2013 1009   BILITOT 0.5 04/03/2013 1328       RADIOGRAPHIC STUDIES: I reviewed the PET/CT scan with her. I have personally reviewed the radiological images as listed and agreed with the findings in the report.  ASSESSMENT & PLAN:  Lymphoma, T-cell Clinically,  she has no evidence of recurrence of disease. She has achieved complete response. I will consult her surgeon to have the port removed. I will see her back in 3 months with history, physical examination and blood work. I educated the patient's signs and symptoms to watch out for disease recurrence. I will not order routine imaging study unless she has evidence to suggest disease recurrence.  Anemia in neoplastic disease This is improving. I recommend observation only.  Neuropathy due to chemotherapeutic drug This is improving. I recommend observation only.  HYPERTENSION I recommend resumption of one of her blood pressure medications.  Physical deconditioning I filled out application for disability placard for 6 months. I encouraged her to exercise as tolerated.   Orders Placed This Encounter  Procedures  . Lactate dehydrogenase    Standing Status: Standing     Number of Occurrences: 9     Standing Expiration Date: 09/04/2014  . Ambulatory referral to General Surgery    Referral Priority:  Routine    Referral Type:  Surgical    Referral Reason:  Specialty Services Required    Referred to Provider:  [name], MD    Requested Specialty:  General Surgery    Number of Visits Requested:  1   All questions were answered. The patient knows to  call the clinic with any problems, questions or concerns. No barriers to learning was detected. I spent 25 minutes counseling the patient face to face. The total time spent in the appointment was 30 minutes and more than 50% was on counseling and review of test results     [facility], [place], MD 09/03/2013 3:48 PM

## 2013-09-05 ENCOUNTER — Telehealth: Payer: Self-pay | Admitting: Hematology and Oncology

## 2013-09-05 NOTE — Telephone Encounter (Signed)
lmonvm advising the pt of her 3 mnth f/u appt in oct.

## 2013-09-06 ENCOUNTER — Other Ambulatory Visit: Payer: Self-pay | Admitting: Hematology and Oncology

## 2013-09-10 ENCOUNTER — Telehealth (INDEPENDENT_AMBULATORY_CARE_PROVIDER_SITE_OTHER): Payer: Self-pay | Admitting: General Surgery

## 2013-09-10 NOTE — Telephone Encounter (Signed)
Spoke with pt and informed her that we could remove the [facility] here in the office under local.  The patient agreed to this and so did Dr. Dalbert Batman.  I have sent around the prio-authorization form for the office procedure.  Patient verbalized understanding of what all this procedure entailed.  She is scheduled to have this done on 09/25/13 at 3:30.

## 2013-09-10 NOTE — Telephone Encounter (Signed)
Message copied by Flossie Buffy on Mon Sep 10, 2013  2:36 PM ------      Message from: [facility] Signs      Created: Mon Sep 10, 2013 12:33 PM       Pt referred back to have PAC removed.Apt made with Dr Dalbert Batman on Aug 11th....Marland KitchenMarland KitchenIs this ok? She said if she has a choice and if she can get it done sooner she would agree to local anesthesia.Thayer Headings ------

## 2013-09-19 ENCOUNTER — Other Ambulatory Visit: Payer: Self-pay | Admitting: Family Medicine

## 2013-09-25 ENCOUNTER — Encounter (INDEPENDENT_AMBULATORY_CARE_PROVIDER_SITE_OTHER): Payer: Self-pay | Admitting: General Surgery

## 2013-09-25 ENCOUNTER — Ambulatory Visit (INDEPENDENT_AMBULATORY_CARE_PROVIDER_SITE_OTHER): Payer: BC Managed Care – PPO | Admitting: General Surgery

## 2013-09-25 VITALS — BP 126/64 | HR 86 | Temp 98.0°F | Resp 18 | Ht 64.0 in | Wt 223.0 lb

## 2013-09-25 DIAGNOSIS — C8590 Non-Hodgkin lymphoma, unspecified, unspecified site: Secondary | ICD-10-CM

## 2013-09-25 DIAGNOSIS — Z452 Encounter for adjustment and management of vascular access device: Secondary | ICD-10-CM

## 2013-09-25 DIAGNOSIS — C8409 Mycosis fungoides, extranodal and solid organ sites: Secondary | ICD-10-CM

## 2013-09-25 NOTE — Patient Instructions (Signed)
Your Port-A-Cath was removed today.  Keep the wound clean and dry for 24 hours. You may take a shower starting tomorrow.  No tub baths or swimming pools for 2 weeks.  You may drive your car when you are comfortable.  Avoid sweaty or dirty activities for 2 or 3 weeks.  Return to see Dr. Dalbert Batman if necessary.

## 2013-09-25 NOTE — Progress Notes (Signed)
Patient ID: Anna Jackson, female   DOB: 1960/08/04, 53 y.o.   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 She has completed her chemotherapy and according to her, there is no evidence of disease. She was referred back for Port-A-Cath removal  Procedure note: Minor procedure room used. Placed supine. Right infraclavicular area prepped with Betadine. 1% Xylocaine with epinephrine local. Transverse incision through old scar. Capsule surrounding port incised. Port elevated and sutures cut the port and catheter removed intact. No bleeding. Subcutaneous tissue closed with 3-0 Vicryl;  skin closed with running 4-0 Monocryl and Dermabond. Tolerated well.  Assessment: Anaplastic large cell lymphoma, no evidence of disease following primary chemotherapy Request Port-A-Cath removal  Plan: Port-A-Cath removed under local anesthesia uneventfully Wound care advice given Return as needed.    Edsel Petrin. Dalbert Batman, M.D., [facility] General and Minimally invasive Surgery Breast and Colorectal Surgery Office:   [phone] Pager:   [phone]

## 2013-09-30 ENCOUNTER — Other Ambulatory Visit: Payer: Self-pay | Admitting: Family Medicine

## 2013-10-25 ENCOUNTER — Other Ambulatory Visit: Payer: Self-pay | Admitting: Hematology and Oncology

## 2013-11-01 ENCOUNTER — Telehealth: Payer: Self-pay | Admitting: Hematology and Oncology

## 2013-11-01 ENCOUNTER — Telehealth: Payer: Self-pay | Admitting: *Deleted

## 2013-11-01 NOTE — Telephone Encounter (Signed)
Pt reports a sore lump under left arm.  She wants Dr. Alvy Bimler to look at it.  She can come in tomorrow afternoon at 2:30 pm.   Sent request to Scheduler to add pt onto schedule tomorrow.

## 2013-11-01 NOTE — Telephone Encounter (Signed)
lvm for pt regarding to no appts avail nxt week....advised pt to call me back to r/s appt

## 2013-11-01 NOTE — Telephone Encounter (Signed)
Pt confirmed labs/ov per 09/17 POF, gave pt AVS...KJ °

## 2013-11-02 ENCOUNTER — Ambulatory Visit (HOSPITAL_BASED_OUTPATIENT_CLINIC_OR_DEPARTMENT_OTHER): Payer: BC Managed Care – PPO | Admitting: Hematology and Oncology

## 2013-11-02 ENCOUNTER — Encounter: Payer: Self-pay | Admitting: Hematology and Oncology

## 2013-11-02 VITALS — BP 114/69 | HR 70 | Temp 98.1°F | Resp 18 | Ht 64.0 in | Wt 222.1 lb

## 2013-11-02 DIAGNOSIS — R209 Unspecified disturbances of skin sensation: Secondary | ICD-10-CM

## 2013-11-02 DIAGNOSIS — L089 Local infection of the skin and subcutaneous tissue, unspecified: Secondary | ICD-10-CM

## 2013-11-02 DIAGNOSIS — C8590 Non-Hodgkin lymphoma, unspecified, unspecified site: Secondary | ICD-10-CM

## 2013-11-02 DIAGNOSIS — IMO0002 Reserved for concepts with insufficient information to code with codable children: Secondary | ICD-10-CM

## 2013-11-02 HISTORY — DX: Local infection of the skin and subcutaneous tissue, unspecified: L08.9

## 2013-11-02 MED ORDER — CEPHALEXIN 500 MG PO CAPS: 500.0000 mg | ORAL_CAPSULE | Freq: Three times a day (TID) | ORAL | Status: DC

## 2013-11-02 NOTE — Progress Notes (Signed)
[place] OFFICE PROGRESS NOTE  Patient Care Team: Rosalita Chessman, DO as PCP - General Fanny Skates, MD as Consulting Physician (General Surgery) Heath Lark, MD as Consulting Physician (Hematology and Oncology)  SUMMARY OF ONCOLOGIC HISTORY: Oncology History   Anaplastic Lymphoma, T-cell, 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)   Staging method: AJCC 7th Edition   Clinical: Stage II signed by Heath Lark, MD on 04/03/2013  3:31 PM   Summary: Stage II      Lymphoma, T-cell   02/07/2013 Imaging [place] confirmed enlarged LN   03/09/2013 Procedure LN biopsy confirmed T cell lymphoma   03/23/2013 Procedure She has port placement   03/26/2013 Imaging Echo showed normal ejection fraction   03/29/2013 Bone Marrow Biopsy Bone marrow biopsy was negative   04/02/2013 Imaging PET/CT scan showed localized, stage II disease   04/04/2013 - 07/20/2013 Chemotherapy She has completed 6 cycles of CHOP plus etoposide chemotherapy.   06/01/2013 Imaging PET CT scan show complete response to treatment   06/27/2013 Adverse Reaction Dose of vincristine was reduced due to peripheral neuropathy.   08/31/2013 Imaging Repeat PET scans show no evidence of disease.    INTERVAL HISTORY: Please see below for problem oriented charting. She requested urgent evaluation due to palpable tender lump under her left axilla. She cut her finger 5 days ago on the left hand. The tenderness in the axilla started 2 days ago. She denies fevers or chills. She denies any abnormal changes on her finger.  REVIEW OF SYSTEMS:   Constitutional: Denies fevers, chills or abnormal weight loss Eyes: Denies blurriness of vision Ears, nose, mouth, throat, and face: Denies mucositis or sore throat Respiratory: Denies cough, dyspnea or wheezes Cardiovascular: Denies palpitation, chest discomfort or lower extremity swelling Gastrointestinal:  Denies nausea, heartburn or change in bowel habits Skin: Denies  abnormal skin rashes Neurological:Denies numbness, tingling or new weaknesses Behavioral/Psych: Mood is stable, no new changes  All other systems were reviewed with the patient and are negative.  I have reviewed the past medical history, past surgical history, social history and family history with the patient and they are unchanged from previous note.  ALLERGIES:  is allergic to betadine and codeine.  MEDICATIONS:  Current Outpatient Prescriptions  Medication Sig Dispense Refill  . buPROPion (WELLBUTRIN XL) 150 MG 24 hr tablet Take 1 tablet (150 mg total) by mouth daily.  90 tablet  1  . butalbital-acetaminophen-caffeine (FIORICET) 50-325-40 MG per tablet Take 1 tablet by mouth every 6 (six) hours as needed for headache.  30 tablet  0  . docusate sodium (COLACE) 100 MG capsule Take 100 mg by mouth daily.      Marland Kitchen FLUoxetine (PROZAC) 40 MG capsule TAKE 1 CAPSULE BY MOUTH DAILY  30 capsule  5  . lidocaine-prilocaine (EMLA) cream Apply 1 application topically as needed.  30 g  0  . lisinopril-hydrochlorothiazide (PRINZIDE,ZESTORETIC) 20-25 MG per tablet 1 tab by mouth daily--Office visit due now  90 tablet  0  . PRILOSEC OTC 20 MG tablet TAKE 1 TABLET (20 MG TOTAL) BY MOUTH DAILY.  30 tablet  10  . cephALEXin (KEFLEX) 500 MG capsule Take 1 capsule (500 mg total) by mouth 3 (three) times daily.  21 capsule  0   No current facility-administered medications for this visit.    PHYSICAL EXAMINATION: ECOG PERFORMANCE STATUS: 0 - Asymptomatic  Filed Vitals:   11/02/13 1430  BP: 114/69  Pulse: 70  Temp: 98.1 F (36.7  C)  Resp: 18   Filed Weights   11/02/13 1430  Weight: 222 lb 1.6 oz (100.744 kg)    GENERAL:alert, no distress and comfortable SKIN: skin color, texture, turgor are normal, no rashes or significant lesions EYES: normal, Conjunctiva are pink and non-injected, sclera clear OROPHARYNX:no exudate, no erythema and lips, buccal mucosa, and tongue normal  NECK: supple, thyroid  normal size, non-tender, without nodularity LYMPH: Tender lump noted on the left axilla. NEURO: alert & oriented x 3 with fluent speech, no focal motor/sensory deficits  LABORATORY DATA:  I have reviewed the data as listed    Component Value Date/Time   NA 141 08/31/2013 1009   NA 140 04/03/2013 1328   K 3.7 08/31/2013 1009   K 4.1 04/03/2013 1328   CL 100 04/03/2013 1328   CO2 26 08/31/2013 1009   CO2 25 04/03/2013 1328   GLUCOSE 96 08/31/2013 1009   GLUCOSE 85 04/03/2013 1328   BUN 8.0 08/31/2013 1009   BUN 13 04/03/2013 1328   CREATININE 0.7 08/31/2013 1009   CREATININE 0.87 04/03/2013 1328   CALCIUM 9.5 08/31/2013 1009   CALCIUM 9.1 04/03/2013 1328   PROT 6.5 08/31/2013 1009   PROT 6.2 04/03/2013 1328   ALBUMIN 3.6 08/31/2013 1009   ALBUMIN 3.9 04/03/2013 1328   AST 26 08/31/2013 1009   AST 16 04/03/2013 1328   ALT 28 08/31/2013 1009   ALT 18 04/03/2013 1328   ALKPHOS 143 08/31/2013 1009   ALKPHOS 164* 04/03/2013 1328   BILITOT 0.66 08/31/2013 1009   BILITOT 0.5 04/03/2013 1328   GFRNONAA 77* 03/23/2013 0953   GFRAA 90* 03/23/2013 0953    No results found for this basename: SPEP, UPEP,  kappa and lambda light chains    Lab Results  Component Value Date   WBC 5.0 08/31/2013   NEUTROABS 3.8 08/31/2013   HGB 11.5* 08/31/2013   HCT 35.3 08/31/2013   MCV 88.9 08/31/2013   PLT 283 08/31/2013      Chemistry      Component Value Date/Time   NA 141 08/31/2013 1009   NA 140 04/03/2013 1328   K 3.7 08/31/2013 1009   K 4.1 04/03/2013 1328   CL 100 04/03/2013 1328   CO2 26 08/31/2013 1009   CO2 25 04/03/2013 1328   BUN 8.0 08/31/2013 1009   BUN 13 04/03/2013 1328   CREATININE 0.7 08/31/2013 1009   CREATININE 0.87 04/03/2013 1328      Component Value Date/Time   CALCIUM 9.5 08/31/2013 1009   CALCIUM 9.1 04/03/2013 1328   ALKPHOS 143 08/31/2013 1009   ALKPHOS 164* 04/03/2013 1328   AST 26 08/31/2013 1009   AST 16 04/03/2013 1328   ALT 28 08/31/2013 1009   ALT 18 04/03/2013 1328   BILITOT 0.66 08/31/2013 1009    BILITOT 0.5 04/03/2013 1328     ASSESSMENT & PLAN:  Skin infection The patient had abnormal lump and tenderness in the left axilla, likely due to recent skin infection. I recommend a course of antibiotics and I reassured her.  Lymphoma, T-cell I will order bloodwork, history and physical examination next month.    No orders of the defined types were placed in this encounter.   All questions were answered. The patient knows to call the clinic with any problems, questions or concerns. No barriers to learning was detected. I spent 15 minutes counseling the patient face to face. The total time spent in the appointment was 20 minutes and more than 50%  was on counseling and review of test results     [facility] [place], [place]eve [place], MD 11/02/2013 3:45 PM

## 2013-11-02 NOTE — Assessment & Plan Note (Signed)
The patient had abnormal lump and tenderness in the left axilla, likely due to recent skin infection. I recommend a course of antibiotics and I reassured her.

## 2013-11-02 NOTE — Assessment & Plan Note (Signed)
I will order bloodwork, history and physical examination next month.

## 2013-12-10 ENCOUNTER — Encounter: Payer: Self-pay | Admitting: Hematology and Oncology

## 2013-12-10 ENCOUNTER — Other Ambulatory Visit (HOSPITAL_BASED_OUTPATIENT_CLINIC_OR_DEPARTMENT_OTHER): Payer: BC Managed Care – PPO

## 2013-12-10 ENCOUNTER — Ambulatory Visit (HOSPITAL_BASED_OUTPATIENT_CLINIC_OR_DEPARTMENT_OTHER): Payer: BC Managed Care – PPO | Admitting: Hematology and Oncology

## 2013-12-10 ENCOUNTER — Telehealth: Payer: Self-pay | Admitting: Hematology and Oncology

## 2013-12-10 VITALS — BP 122/87 | HR 82 | Temp 98.6°F | Resp 18 | Ht 64.0 in | Wt 222.4 lb

## 2013-12-10 DIAGNOSIS — C8464 Anaplastic large cell lymphoma, ALK-positive, lymph nodes of axilla and upper limb: Secondary | ICD-10-CM

## 2013-12-10 DIAGNOSIS — Z23 Encounter for immunization: Secondary | ICD-10-CM

## 2013-12-10 DIAGNOSIS — C8590 Non-Hodgkin lymphoma, unspecified, unspecified site: Secondary | ICD-10-CM

## 2013-12-10 LAB — CBC WITH DIFFERENTIAL/PLATELET
BASO%: 0.5 % (ref 0.0–2.0)
BASOS ABS: 0 10*3/uL (ref 0.0–0.1)
EOS ABS: 0.1 10*3/uL (ref 0.0–0.5)
EOS%: 1.5 % (ref 0.0–7.0)
HCT: 39.3 % (ref 34.8–46.6)
HGB: 12.8 g/dL (ref 11.6–15.9)
LYMPH%: 19.7 % (ref 14.0–49.7)
MCH: 27.4 pg (ref 25.1–34.0)
MCHC: 32.5 g/dL (ref 31.5–36.0)
MCV: 84.2 fL (ref 79.5–101.0)
MONO#: 0.5 10*3/uL (ref 0.1–0.9)
MONO%: 9.1 % (ref 0.0–14.0)
NEUT%: 69.2 % (ref 38.4–76.8)
NEUTROS ABS: 3.8 10*3/uL (ref 1.5–6.5)
Platelets: 277 10*3/uL (ref 145–400)
RBC: 4.66 10*6/uL (ref 3.70–5.45)
RDW: 15.1 % — AB (ref 11.2–14.5)
WBC: 5.5 10*3/uL (ref 3.9–10.3)
lymph#: 1.1 10*3/uL (ref 0.9–3.3)

## 2013-12-10 LAB — COMPREHENSIVE METABOLIC PANEL (CC13)
ALT: 31 U/L (ref 0–55)
AST: 21 U/L (ref 5–34)
Albumin: 3.5 g/dL (ref 3.5–5.0)
Alkaline Phosphatase: 165 U/L — ABNORMAL HIGH (ref 40–150)
Anion Gap: 9 mEq/L (ref 3–11)
BUN: 9.6 mg/dL (ref 7.0–26.0)
CALCIUM: 9.9 mg/dL (ref 8.4–10.4)
CHLORIDE: 103 meq/L (ref 98–109)
CO2: 28 mEq/L (ref 22–29)
Creatinine: 0.8 mg/dL (ref 0.6–1.1)
Glucose: 90 mg/dl (ref 70–140)
Potassium: 3.5 mEq/L (ref 3.5–5.1)
Sodium: 139 mEq/L (ref 136–145)
Total Bilirubin: 0.52 mg/dL (ref 0.20–1.20)
Total Protein: 6.7 g/dL (ref 6.4–8.3)

## 2013-12-10 LAB — LACTATE DEHYDROGENASE (CC13): LDH: 174 U/L (ref 125–245)

## 2013-12-10 MED ORDER — PNEUMOCOCCAL 13-VAL CONJ VACC IM SUSP
0.5000 mL | Freq: Once | INTRAMUSCULAR | Status: AC
  Administered 2013-12-10: 0.5 mL via INTRAMUSCULAR
  Filled 2013-12-10: qty 0.5

## 2013-12-10 MED ORDER — PNEUMOCOCCAL VAC POLYVALENT 25 MCG/0.5ML IJ INJ: 0.5000 mL | INJECTION | Freq: Once | INTRAMUSCULAR | Status: DC

## 2013-12-10 MED ORDER — INFLUENZA VAC SPLIT QUAD 0.5 ML IM SUSY
0.5000 mL | PREFILLED_SYRINGE | Freq: Once | INTRAMUSCULAR | Status: AC
  Administered 2013-12-10: 0.5 mL via INTRAMUSCULAR
  Filled 2013-12-10: qty 0.5

## 2013-12-10 NOTE — Progress Notes (Signed)
[facility] OFFICE PROGRESS NOTE  Patient Care Team: Rosalita Chessman, DO as PCP - General Fanny Skates, MD as Consulting Physician (General Surgery) Heath Lark, MD as Consulting Physician (Hematology and Oncology)  SUMMARY OF ONCOLOGIC HISTORY: Oncology History   Anaplastic Lymphoma, T-cell, 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)   Staging method: AJCC 7th Edition   Clinical: Stage II signed by Heath Lark, MD on 04/03/2013  3:31 PM   Summary: Stage II      Lymphoma, T-cell   02/07/2013 Imaging [place] confirmed enlarged LN   03/09/2013 Procedure LN biopsy confirmed T cell lymphoma   03/23/2013 Procedure She has port placement   03/26/2013 Imaging Echo showed normal ejection fraction   03/29/2013 Bone Marrow Biopsy Bone marrow biopsy was negative   04/02/2013 Imaging PET/CT scan showed localized, stage II disease   04/04/2013 - 07/20/2013 Chemotherapy She has completed 6 cycles of CHOP plus etoposide chemotherapy.   06/01/2013 Imaging PET CT scan show complete response to treatment   06/27/2013 Adverse Reaction Dose of vincristine was reduced due to peripheral neuropathy.   08/31/2013 Imaging Repeat PET scans show no evidence of disease.    INTERVAL HISTORY: Please see below for problem oriented charting. She feels well. Denies new lymphadenopathy.  REVIEW OF SYSTEMS:   Constitutional: Denies fevers, chills or abnormal weight loss Eyes: Denies blurriness of vision Ears, nose, mouth, throat, and face: Denies mucositis or sore throat Respiratory: Denies cough, dyspnea or wheezes Cardiovascular: Denies palpitation, chest discomfort or lower extremity swelling Gastrointestinal:  Denies nausea, heartburn or change in bowel habits Skin: Denies abnormal skin rashes Lymphatics: Denies new lymphadenopathy or easy bruising Neurological:Denies numbness, tingling or new weaknesses Behavioral/Psych: Mood is stable, no new changes  All other systems were  reviewed with the patient and are negative.  I have reviewed the past medical history, past surgical history, social history and family history with the patient and they are unchanged from previous note.  ALLERGIES:  is allergic to betadine and codeine.  MEDICATIONS:  Current Outpatient Prescriptions  Medication Sig Dispense Refill  . buPROPion (WELLBUTRIN XL) 150 MG 24 hr tablet Take 1 tablet (150 mg total) by mouth daily.  90 tablet  1  . butalbital-acetaminophen-caffeine (FIORICET) 50-325-40 MG per tablet Take 1 tablet by mouth every 6 (six) hours as needed for headache.  30 tablet  0  . docusate sodium (COLACE) 100 MG capsule Take 100 mg by mouth daily.      Marland Kitchen FLUoxetine (PROZAC) 40 MG capsule TAKE 1 CAPSULE BY MOUTH DAILY  30 capsule  5  . lidocaine-prilocaine (EMLA) cream Apply 1 application topically as needed.  30 g  0  . lisinopril-hydrochlorothiazide (PRINZIDE,ZESTORETIC) 20-25 MG per tablet 1 tab by mouth daily--Office visit due now  90 tablet  0  . PRILOSEC OTC 20 MG tablet TAKE 1 TABLET (20 MG TOTAL) BY MOUTH DAILY.  30 tablet  10   No current facility-administered medications for this visit.    PHYSICAL EXAMINATION: ECOG PERFORMANCE STATUS: 0 - Asymptomatic  Filed Vitals:   12/10/13 1142  BP: 122/87  Pulse: 82  Temp: 98.6 F (37 C)  Resp: 18   Filed Weights   12/10/13 1142  Weight: 222 lb 6.4 oz (100.88 kg)    GENERAL:alert, no distress and comfortable. she is morbidly obese  SKIN: skin color, texture, turgor are normal, no rashes or significant lesions EYES: normal, Conjunctiva are pink and non-injected, sclera clear OROPHARYNX:no exudate, no  erythema and lips, buccal mucosa, and tongue normal  NECK: supple, thyroid normal size, non-tender, without nodularity LYMPH:  no palpable lymphadenopathy in the cervical, axillary or inguinal LUNGS: clear to auscultation and percussion with normal breathing effort HEART: regular rate & rhythm and no murmurs and no lower  extremity edema ABDOMEN:abdomen soft, non-tender and normal bowel sounds Musculoskeletal:no cyanosis of digits and no clubbing  NEURO: alert & oriented x 3 with fluent speech, no focal motor/sensory deficits  LABORATORY DATA:  I have reviewed the data as listed    Component Value Date/Time   NA 139 12/10/2013 1131   NA 140 04/03/2013 1328   K 3.5 12/10/2013 1131   K 4.1 04/03/2013 1328   CL 100 04/03/2013 1328   CO2 28 12/10/2013 1131   CO2 25 04/03/2013 1328   GLUCOSE 90 12/10/2013 1131   GLUCOSE 85 04/03/2013 1328   BUN 9.6 12/10/2013 1131   BUN 13 04/03/2013 1328   CREATININE 0.8 12/10/2013 1131   CREATININE 0.87 04/03/2013 1328   CALCIUM 9.9 12/10/2013 1131   CALCIUM 9.1 04/03/2013 1328   PROT 6.7 12/10/2013 1131   PROT 6.2 04/03/2013 1328   ALBUMIN 3.5 12/10/2013 1131   ALBUMIN 3.9 04/03/2013 1328   AST 21 12/10/2013 1131   AST 16 04/03/2013 1328   ALT 31 12/10/2013 1131   ALT 18 04/03/2013 1328   ALKPHOS 165* 12/10/2013 1131   ALKPHOS 164* 04/03/2013 1328   BILITOT 0.52 12/10/2013 1131   BILITOT 0.5 04/03/2013 1328   GFRNONAA 77* 03/23/2013 0953   GFRAA 90* 03/23/2013 0953    No results found for this basename: SPEP, UPEP,  kappa and lambda light chains    Lab Results  Component Value Date   WBC 5.5 12/10/2013   NEUTROABS 3.8 12/10/2013   HGB 12.8 12/10/2013   HCT 39.3 12/10/2013   MCV 84.2 12/10/2013   PLT 277 12/10/2013      Chemistry      Component Value Date/Time   NA 139 12/10/2013 1131   NA 140 04/03/2013 1328   K 3.5 12/10/2013 1131   K 4.1 04/03/2013 1328   CL 100 04/03/2013 1328   CO2 28 12/10/2013 1131   CO2 25 04/03/2013 1328   BUN 9.6 12/10/2013 1131   BUN 13 04/03/2013 1328   CREATININE 0.8 12/10/2013 1131   CREATININE 0.87 04/03/2013 1328      Component Value Date/Time   CALCIUM 9.9 12/10/2013 1131   CALCIUM 9.1 04/03/2013 1328   ALKPHOS 165* 12/10/2013 1131   ALKPHOS 164* 04/03/2013 1328   AST 21 12/10/2013 1131   AST 16 04/03/2013 1328   ALT 31  12/10/2013 1131   ALT 18 04/03/2013 1328   BILITOT 0.52 12/10/2013 1131   BILITOT 0.5 04/03/2013 1328     ASSESSMENT & PLAN:  Lymphoma, T-cell Clinically, she has no signs of recurrence. I will see her back in 3 months with repeat history, physical examination and blood work. We discussed the importance of preventive care and reviewed the vaccination programs. She does not have any prior allergic reactions to influenza or pneumococcal vaccination. She agrees to proceed with both vaccinations today and we will administer it today at the clinic.   Orders Placed This Encounter  Procedures  . CBC with Differential    Standing Status: Standing     Number of Occurrences: 9     Standing Expiration Date: 12/11/2014  . Comprehensive metabolic panel    Standing Status: Standing  Number of Occurrences: 9     Standing Expiration Date: 12/11/2014   All questions were answered. The patient knows to call the clinic with any problems, questions or concerns. No barriers to learning was detected. I spent 15 minutes counseling the patient face to face. The total time spent in the appointment was 20 minutes and more than 50% was on counseling and review of test results     [facility], [place], [place] 12/10/2013 12:36 PM

## 2013-12-10 NOTE — Telephone Encounter (Signed)
Pt confirmed labs/ov per 10/26 POF, gave pt AVS.... KJ °

## 2013-12-10 NOTE — Assessment & Plan Note (Signed)
Clinically, she has no signs of recurrence. I will see her back in 3 months with repeat history, physical examination and blood work.

## 2013-12-17 ENCOUNTER — Other Ambulatory Visit: Payer: Self-pay

## 2013-12-17 MED ORDER — BUPROPION HCL ER (XL) 150 MG PO TB24: 150.0000 mg | ORAL_TABLET | Freq: Every day | ORAL | Status: DC

## 2013-12-25 ENCOUNTER — Other Ambulatory Visit: Payer: Self-pay

## 2013-12-25 MED ORDER — FLUOXETINE HCL 40 MG PO CAPS: ORAL_CAPSULE | ORAL | Status: DC

## 2013-12-27 ENCOUNTER — Other Ambulatory Visit: Payer: Self-pay | Admitting: Hematology and Oncology

## 2014-01-08 ENCOUNTER — Ambulatory Visit (INDEPENDENT_AMBULATORY_CARE_PROVIDER_SITE_OTHER): Payer: BC Managed Care – PPO | Admitting: Family Medicine

## 2014-01-08 ENCOUNTER — Encounter: Payer: Self-pay | Admitting: Family Medicine

## 2014-01-08 VITALS — BP 112/80 | HR 73 | Temp 98.0°F | Wt 223.8 lb

## 2014-01-08 DIAGNOSIS — I10 Essential (primary) hypertension: Secondary | ICD-10-CM

## 2014-01-08 DIAGNOSIS — E669 Obesity, unspecified: Secondary | ICD-10-CM

## 2014-01-08 DIAGNOSIS — C8590 Non-Hodgkin lymphoma, unspecified, unspecified site: Secondary | ICD-10-CM

## 2014-01-08 DIAGNOSIS — C8400 Mycosis fungoides, unspecified site: Secondary | ICD-10-CM

## 2014-01-08 MED ORDER — LISINOPRIL-HYDROCHLOROTHIAZIDE 20-25 MG PO TABS: ORAL_TABLET | ORAL | Status: DC

## 2014-01-08 NOTE — Patient Instructions (Signed)

## 2014-01-08 NOTE — Progress Notes (Signed)
Pre visit review using our clinic review tool, if applicable. No additional management support is needed unless otherwise documented below in the visit note. 

## 2014-01-08 NOTE — Assessment & Plan Note (Signed)
Per oncology °

## 2014-01-08 NOTE — Progress Notes (Signed)
  Subjective:    Patient here for follow-up of elevated blood pressure.  She is not exercising and is adherent to a low-salt diet.  Blood pressure is well controlled at home. Cardiac symptoms: none. Patient denies: chest pain, chest pressure/discomfort, claudication, dyspnea, exertional chest pressure/discomfort, fatigue, irregular heart beat, lower extremity edema, near-syncope, orthopnea, palpitations, paroxysmal nocturnal dyspnea, syncope and tachypnea. Cardiovascular risk factors: hypertension, obesity (BMI >= 30 kg/m2) and sedentary lifestyle. Use of agents associated with hypertension: none. History of target organ damage: none. Her chemo for lymphoma ended this summer and she is now seeing oncoloy q1months The following portions of the patient's history were reviewed and updated as appropriate: allergies, current medications, past family history, past medical history, past social history, past surgical history and problem list.  Review of Systems Pertinent items are noted in HPI.     Objective:    BP 112/80 mmHg  Pulse 73  Temp(Src) 98 F (36.7 C) (Oral)  Wt 223 lb 12.8 oz (101.515 kg)  SpO2 98% General appearance: alert, cooperative, appears stated age and no distress Throat: lips, mucosa, and tongue normal; teeth and gums normal Neck: no adenopathy, supple, symmetrical, trachea midline and thyroid not enlarged, symmetric, no tenderness/mass/nodules Lungs: clear to auscultation bilaterally Heart: S1, S2 normal Extremities: extremities normal, atraumatic, no cyanosis or edema    Assessment:    Hypertension, normal blood pressure . Evidence of target organ damage: none.    Plan:    Medication: no change. Dietary sodium restriction. Regular aerobic exercise. Follow up: 6 months and as needed.    1. Essential hypertension   - lisinopril-hydrochlorothiazide (PRINZIDE,ZESTORETIC) 20-25 MG per tablet; 1 tab by mouth daily--Office visit due now  Dispense: 90 tablet; Refill:  3

## 2014-01-15 ENCOUNTER — Ambulatory Visit (HOSPITAL_BASED_OUTPATIENT_CLINIC_OR_DEPARTMENT_OTHER): Payer: BC Managed Care – PPO | Admitting: Hematology and Oncology

## 2014-01-15 ENCOUNTER — Other Ambulatory Visit (HOSPITAL_BASED_OUTPATIENT_CLINIC_OR_DEPARTMENT_OTHER): Payer: BC Managed Care – PPO

## 2014-01-15 ENCOUNTER — Encounter: Payer: Self-pay | Admitting: Hematology and Oncology

## 2014-01-15 ENCOUNTER — Telehealth: Payer: Self-pay | Admitting: *Deleted

## 2014-01-15 VITALS — BP 121/74 | HR 84 | Temp 97.3°F | Resp 18 | Ht 64.0 in | Wt 226.5 lb

## 2014-01-15 DIAGNOSIS — J069 Acute upper respiratory infection, unspecified: Secondary | ICD-10-CM

## 2014-01-15 DIAGNOSIS — C8400 Mycosis fungoides, unspecified site: Secondary | ICD-10-CM

## 2014-01-15 DIAGNOSIS — C8590 Non-Hodgkin lymphoma, unspecified, unspecified site: Secondary | ICD-10-CM

## 2014-01-15 HISTORY — DX: Acute upper respiratory infection, unspecified: J06.9

## 2014-01-15 LAB — COMPREHENSIVE METABOLIC PANEL (CC13)
ALT: 31 U/L (ref 0–55)
AST: 22 U/L (ref 5–34)
Albumin: 3.7 g/dL (ref 3.5–5.0)
Alkaline Phosphatase: 185 U/L — ABNORMAL HIGH (ref 40–150)
Anion Gap: 11 mEq/L (ref 3–11)
BILIRUBIN TOTAL: 0.51 mg/dL (ref 0.20–1.20)
BUN: 12.8 mg/dL (ref 7.0–26.0)
CO2: 27 meq/L (ref 22–29)
Calcium: 9.6 mg/dL (ref 8.4–10.4)
Chloride: 103 mEq/L (ref 98–109)
Creatinine: 0.8 mg/dL (ref 0.6–1.1)
GLUCOSE: 89 mg/dL (ref 70–140)
POTASSIUM: 3.5 meq/L (ref 3.5–5.1)
SODIUM: 141 meq/L (ref 136–145)
Total Protein: 7 g/dL (ref 6.4–8.3)

## 2014-01-15 LAB — CBC WITH DIFFERENTIAL/PLATELET
BASO%: 0.3 % (ref 0.0–2.0)
Basophils Absolute: 0 10*3/uL (ref 0.0–0.1)
EOS%: 1.8 % (ref 0.0–7.0)
Eosinophils Absolute: 0.1 10*3/uL (ref 0.0–0.5)
HEMATOCRIT: 38 % (ref 34.8–46.6)
HGB: 12.4 g/dL (ref 11.6–15.9)
LYMPH%: 13.9 % — ABNORMAL LOW (ref 14.0–49.7)
MCH: 28.6 pg (ref 25.1–34.0)
MCHC: 32.6 g/dL (ref 31.5–36.0)
MCV: 87.6 fL (ref 79.5–101.0)
MONO#: 0.8 10*3/uL (ref 0.1–0.9)
MONO%: 11.1 % (ref 0.0–14.0)
NEUT#: 5.2 10*3/uL (ref 1.5–6.5)
NEUT%: 72.9 % (ref 38.4–76.8)
Platelets: 243 10*3/uL (ref 145–400)
RBC: 4.34 10*6/uL (ref 3.70–5.45)
RDW: 14.4 % (ref 11.2–14.5)
WBC: 7.1 10*3/uL (ref 3.9–10.3)
lymph#: 1 10*3/uL (ref 0.9–3.3)

## 2014-01-15 LAB — LACTATE DEHYDROGENASE (CC13): LDH: 191 U/L (ref 125–245)

## 2014-01-15 MED ORDER — AZITHROMYCIN 500 MG PO TABS: 500.0000 mg | ORAL_TABLET | Freq: Every day | ORAL | Status: DC

## 2014-01-15 NOTE — Assessment & Plan Note (Signed)
Clinically, she has no signs of disease recurrence. I reassured her.

## 2014-01-15 NOTE — Telephone Encounter (Signed)
Left VM for pt Dr. Alvy Bimler can see her this afternoon.  She needs to be here at 3:15 pm for lab and then dr. Alvy Bimler at 3:45 pm.  Asked her to call back to confirm.

## 2014-01-15 NOTE — Assessment & Plan Note (Signed)
Her clinically swollen lymph nodes in her symptoms are highly suggestive of upper respiratory tract infection, likely viral in origin. She has no signs of leukocytosis and at this point in time I do not think oral antibiotic therapy would benefit her. However, given her recent exposure to chemotherapy, if her symptoms does not improve over the next few days to get worse, she may need oral antibiotic therapy. I have given her a prescription for azithromycin to hang onto and she has my permission to proceed with treatment if needed.

## 2014-01-15 NOTE — Telephone Encounter (Signed)
Pt c/o sore throat 2 weeks ago for about one week.  Now it has returned a few days ago.  Pt states hurts to swallow,  Has low grade temp 99.0 F and swollen lymph nodes.  She also feels tired.  Pt asks if she should see Dr. Alvy Bimler or call her PCP?

## 2014-01-15 NOTE — Progress Notes (Signed)
[facility] [place] OFFICE PROGRESS NOTE  Patient Care Team: Rosalita Chessman, DO as PCP - General Fanny Skates, MD as Consulting Physician (General Surgery) Heath Lark, MD as Consulting Physician (Hematology and Oncology)  SUMMARY OF ONCOLOGIC HISTORY: Oncology History   Anaplastic Lymphoma, T-cell, 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)   Staging method: AJCC 7th Edition   Clinical: Stage II signed by Heath Lark, MD on 04/03/2013  3:31 PM   Summary: Stage II      Lymphoma, T-cell   02/07/2013 Imaging [place] confirmed enlarged LN   03/09/2013 Procedure LN biopsy confirmed T cell lymphoma   03/23/2013 Procedure She has port placement   03/26/2013 Imaging Echo showed normal ejection fraction   03/29/2013 Bone Marrow Biopsy Bone marrow biopsy was negative   04/02/2013 Imaging PET/CT scan showed localized, stage II disease   04/04/2013 - 07/20/2013 Chemotherapy She has completed 6 cycles of CHOP plus etoposide chemotherapy.   06/01/2013 Imaging PET CT scan show complete response to treatment   06/27/2013 Adverse Reaction Dose of vincristine was reduced due to peripheral neuropathy.   08/31/2013 Imaging Repeat PET scans show no evidence of disease.    INTERVAL HISTORY: Please see below for problem oriented charting. She is seen urgently today due to unresolved upper respiratory tract symptoms. She complained of persistent sore throat, rating it 5 out of 10 pain. She also has low-grade fever at home at around 89F. She complained of some persistent clear nasal drainage and perception of swollen lymph nodes in the left cervical chain area. She denies recent cough.  REVIEW OF SYSTEMS:   Constitutional: Denies fevers, chills or abnormal weight loss Eyes: Denies blurriness of vision Respiratory: Denies cough, dyspnea or wheezes Cardiovascular: Denies palpitation, chest discomfort or lower extremity swelling Gastrointestinal:  Denies nausea, heartburn or change in  bowel habits Skin: Denies abnormal skin rashes Lymphatics: Denies new lymphadenopathy or easy bruising Neurological:Denies numbness, tingling or new weaknesses Behavioral/Psych: Mood is stable, no new changes  All other systems were reviewed with the patient and are negative.  I have reviewed the past medical history, past surgical history, social history and family history with the patient and they are unchanged from previous note.  ALLERGIES:  is allergic to betadine and codeine.  MEDICATIONS:  Current Outpatient Prescriptions  Medication Sig Dispense Refill  . aspirin 81 MG tablet Take 81 mg by mouth daily.    Marland Kitchen buPROPion (WELLBUTRIN XL) 150 MG 24 hr tablet Take 1 tablet (150 mg total) by mouth daily. Office visit due now 90 tablet 1  . butalbital-acetaminophen-caffeine (FIORICET) 50-325-40 MG per tablet Take 1 tablet by mouth every 6 (six) hours as needed for headache. 30 tablet 0  . FLUoxetine (PROZAC) 40 MG capsule TAKE 1 CAPSULE BY MOUTH DAILY 30 capsule 2  . lidocaine-prilocaine (EMLA) cream Apply 1 application topically as needed. 30 g 0  . lisinopril-hydrochlorothiazide (PRINZIDE,ZESTORETIC) 20-25 MG per tablet 1 tab by mouth daily--Office visit due now 90 tablet 3  . PRILOSEC OTC 20 MG tablet TAKE 1 TABLET (20 MG TOTAL) BY MOUTH DAILY. 30 tablet 10  . azithromycin (ZITHROMAX) 500 MG tablet Take 1 tablet (500 mg total) by mouth daily. 3 tablet 0   No current facility-administered medications for this visit.    PHYSICAL EXAMINATION: ECOG PERFORMANCE STATUS: 1 - Symptomatic but completely ambulatory  Filed Vitals:   01/15/14 1535  BP: 121/74  Pulse: 84  Temp: 97.3 F (36.3 C)  Resp: 18  Filed Weights   01/15/14 1535  Weight: 226 lb 8 oz (102.74 kg)    GENERAL:alert, no distress and comfortable SKIN: skin color, texture, turgor are normal, no rashes or significant lesions EYES: normal, Conjunctiva are pink and non-injected, sclera clear OROPHARYNX:no exudate, no  erythema and lips, buccal mucosa, and tongue normal  NECK: supple, thyroid normal size, non-tender, without nodularity LYMPH:  Mild palpable lymphadenopathy in the left occipital region. LUNGS: clear to auscultation and percussion with normal breathing effort HEART: regular rate & rhythm and no murmurs and no lower extremity edema ABDOMEN:abdomen soft, non-tender and normal bowel sounds Musculoskeletal:no cyanosis of digits and no clubbing  NEURO: alert & oriented x 3 with fluent speech, no focal motor/sensory deficits  LABORATORY DATA:  I have reviewed the data as listed    Component Value Date/Time   NA 141 01/15/2014 1532   NA 140 04/03/2013 1328   K 3.5 01/15/2014 1532   K 4.1 04/03/2013 1328   CL 100 04/03/2013 1328   CO2 27 01/15/2014 1532   CO2 25 04/03/2013 1328   GLUCOSE 89 01/15/2014 1532   GLUCOSE 85 04/03/2013 1328   BUN 12.8 01/15/2014 1532   BUN 13 04/03/2013 1328   CREATININE 0.8 01/15/2014 1532   CREATININE 0.87 04/03/2013 1328   CALCIUM 9.6 01/15/2014 1532   CALCIUM 9.1 04/03/2013 1328   PROT 7.0 01/15/2014 1532   PROT 6.2 04/03/2013 1328   ALBUMIN 3.7 01/15/2014 1532   ALBUMIN 3.9 04/03/2013 1328   AST 22 01/15/2014 1532   AST 16 04/03/2013 1328   ALT 31 01/15/2014 1532   ALT 18 04/03/2013 1328   ALKPHOS 185* 01/15/2014 1532   ALKPHOS 164* 04/03/2013 1328   BILITOT 0.51 01/15/2014 1532   BILITOT 0.5 04/03/2013 1328   GFRNONAA 77* 03/23/2013 0953   GFRAA 90* 03/23/2013 0953    No results found for: SPEP, UPEP  Lab Results  Component Value Date   WBC 7.1 01/15/2014   NEUTROABS 5.2 01/15/2014   HGB 12.4 01/15/2014   HCT 38.0 01/15/2014   MCV 87.6 01/15/2014   PLT 243 01/15/2014      Chemistry      Component Value Date/Time   NA 141 01/15/2014 1532   NA 140 04/03/2013 1328   K 3.5 01/15/2014 1532   K 4.1 04/03/2013 1328   CL 100 04/03/2013 1328   CO2 27 01/15/2014 1532   CO2 25 04/03/2013 1328   BUN 12.8 01/15/2014 1532   BUN 13  04/03/2013 1328   CREATININE 0.8 01/15/2014 1532   CREATININE 0.87 04/03/2013 1328      Component Value Date/Time   CALCIUM 9.6 01/15/2014 1532   CALCIUM 9.1 04/03/2013 1328   ALKPHOS 185* 01/15/2014 1532   ALKPHOS 164* 04/03/2013 1328   AST 22 01/15/2014 1532   AST 16 04/03/2013 1328   ALT 31 01/15/2014 1532   ALT 18 04/03/2013 1328   BILITOT 0.51 01/15/2014 1532   BILITOT 0.5 04/03/2013 1328     ASSESSMENT & PLAN:  Lymphoma, T-cell Clinically, she has no signs of disease recurrence. I reassured her.  URI (upper respiratory infection) Her clinically swollen lymph nodes in her symptoms are highly suggestive of upper respiratory tract infection, likely viral in origin. She has no signs of leukocytosis and at this point in time I do not think oral antibiotic therapy would benefit her. However, given her recent exposure to chemotherapy, if her symptoms does not improve over the next few days to get worse,  she may need oral antibiotic therapy. I have given her a prescription for azithromycin to hang onto and she has my permission to proceed with treatment if needed.   No orders of the defined types were placed in this encounter.   All questions were answered. The patient knows to call the clinic with any problems, questions or concerns. No barriers to learning was detected. I spent 15 minutes counseling the patient face to face. The total time spent in the appointment was 20 minutes and more than 50% was on counseling and review of test results     [facility], [place], MD 01/15/2014 7:57 PM

## 2014-02-02 ENCOUNTER — Telehealth: Payer: Self-pay | Admitting: Hematology and Oncology

## 2014-02-02 NOTE — Telephone Encounter (Signed)
returned pt call and lvm that i cx 12.21 inj and moved to 12.24 per pt request on vm.Marland KitchenMarland Kitchen

## 2014-02-04 ENCOUNTER — Ambulatory Visit: Payer: BC Managed Care – PPO

## 2014-02-07 ENCOUNTER — Ambulatory Visit (HOSPITAL_BASED_OUTPATIENT_CLINIC_OR_DEPARTMENT_OTHER): Payer: BC Managed Care – PPO

## 2014-02-07 ENCOUNTER — Other Ambulatory Visit: Payer: Self-pay | Admitting: Physician Assistant

## 2014-02-07 VITALS — BP 116/79 | HR 71 | Temp 98.6°F | Resp 16

## 2014-02-07 DIAGNOSIS — C8590 Non-Hodgkin lymphoma, unspecified, unspecified site: Secondary | ICD-10-CM

## 2014-02-07 DIAGNOSIS — Z23 Encounter for immunization: Secondary | ICD-10-CM

## 2014-02-07 MED ORDER — PNEUMOCOCCAL VAC POLYVALENT 25 MCG/0.5ML IJ INJ
0.5000 mL | INJECTION | Freq: Once | INTRAMUSCULAR | Status: AC
  Administered 2014-02-07: 0.5 mL via INTRAMUSCULAR
  Filled 2014-02-07: qty 0.5

## 2014-03-12 ENCOUNTER — Ambulatory Visit (HOSPITAL_BASED_OUTPATIENT_CLINIC_OR_DEPARTMENT_OTHER): Payer: BLUE CROSS/BLUE SHIELD | Admitting: Hematology and Oncology

## 2014-03-12 ENCOUNTER — Other Ambulatory Visit (HOSPITAL_BASED_OUTPATIENT_CLINIC_OR_DEPARTMENT_OTHER): Payer: BLUE CROSS/BLUE SHIELD

## 2014-03-12 ENCOUNTER — Telehealth: Payer: Self-pay | Admitting: Hematology and Oncology

## 2014-03-12 ENCOUNTER — Encounter: Payer: Self-pay | Admitting: Hematology and Oncology

## 2014-03-12 VITALS — BP 130/74 | HR 84 | Temp 97.8°F | Resp 18 | Ht 64.0 in | Wt 226.9 lb

## 2014-03-12 DIAGNOSIS — C8590 Non-Hodgkin lymphoma, unspecified, unspecified site: Secondary | ICD-10-CM

## 2014-03-12 DIAGNOSIS — J069 Acute upper respiratory infection, unspecified: Secondary | ICD-10-CM

## 2014-03-12 DIAGNOSIS — C8400 Mycosis fungoides, unspecified site: Secondary | ICD-10-CM

## 2014-03-12 DIAGNOSIS — E785 Hyperlipidemia, unspecified: Secondary | ICD-10-CM | POA: Insufficient documentation

## 2014-03-12 DIAGNOSIS — B351 Tinea unguium: Secondary | ICD-10-CM

## 2014-03-12 HISTORY — DX: Hyperlipidemia, unspecified: E78.5

## 2014-03-12 LAB — COMPREHENSIVE METABOLIC PANEL (CC13)
ALT: 31 U/L (ref 0–55)
AST: 27 U/L (ref 5–34)
Albumin: 3.7 g/dL (ref 3.5–5.0)
Alkaline Phosphatase: 154 U/L — ABNORMAL HIGH (ref 40–150)
Anion Gap: 10 mEq/L (ref 3–11)
BILIRUBIN TOTAL: 0.68 mg/dL (ref 0.20–1.20)
BUN: 14.1 mg/dL (ref 7.0–26.0)
CO2: 25 meq/L (ref 22–29)
Calcium: 9.3 mg/dL (ref 8.4–10.4)
Chloride: 104 mEq/L (ref 98–109)
Creatinine: 0.9 mg/dL (ref 0.6–1.1)
EGFR: 72 mL/min/{1.73_m2} — ABNORMAL LOW (ref >90)
Glucose: 96 mg/dl (ref 70–140)
POTASSIUM: 4 meq/L (ref 3.5–5.1)
Sodium: 139 mEq/L (ref 136–145)
Total Protein: 6.8 g/dL (ref 6.4–8.3)

## 2014-03-12 LAB — CBC WITH DIFFERENTIAL/PLATELET
BASO%: 0.4 % (ref 0.0–2.0)
Basophils Absolute: 0 10*3/uL (ref 0.0–0.1)
EOS ABS: 0.1 10*3/uL (ref 0.0–0.5)
EOS%: 1.7 % (ref 0.0–7.0)
HCT: 40 % (ref 34.8–46.6)
HEMOGLOBIN: 13 g/dL (ref 11.6–15.9)
LYMPH#: 1.1 10*3/uL (ref 0.9–3.3)
LYMPH%: 20.8 % (ref 14.0–49.7)
MCH: 28.8 pg (ref 25.1–34.0)
MCHC: 32.5 g/dL (ref 31.5–36.0)
MCV: 88.5 fL (ref 79.5–101.0)
MONO#: 0.4 10*3/uL (ref 0.1–0.9)
MONO%: 6.9 % (ref 0.0–14.0)
NEUT#: 3.8 10*3/uL (ref 1.5–6.5)
NEUT%: 70.2 % (ref 38.4–76.8)
PLATELETS: 259 10*3/uL (ref 145–400)
RBC: 4.52 10*6/uL (ref 3.70–5.45)
RDW: 13.4 % (ref 11.2–14.5)
WBC: 5.3 10*3/uL (ref 3.9–10.3)

## 2014-03-12 LAB — LACTATE DEHYDROGENASE (CC13): LDH: 224 U/L (ref 125–245)

## 2014-03-12 NOTE — Assessment & Plan Note (Signed)
I recommend we continue to monitor the toenail closely. I suspect it would improve over the next 6 months. If not, she may need treatment for possible fungal infection.

## 2014-03-12 NOTE — Assessment & Plan Note (Signed)
Clinically, she has no signs of disease recurrence. Plan to see her back in 3 months with history, physical examination and blood work.

## 2014-03-12 NOTE — Progress Notes (Signed)
[facility] OFFICE PROGRESS NOTE  Patient Care Team: Rosalita Chessman, DO as PCP - General Fanny Skates, MD as Consulting Physician (General Surgery) Heath Lark, MD as Consulting Physician (Hematology and Oncology)  SUMMARY OF ONCOLOGIC HISTORY: Oncology History   Anaplastic Lymphoma, T-cell, 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)   Staging method: AJCC 7th Edition   Clinical: Stage II signed by Heath Lark, MD on 04/03/2013  3:31 PM   Summary: Stage II      Lymphoma, T-cell   02/07/2013 Imaging [place] confirmed enlarged LN   03/09/2013 Procedure LN biopsy confirmed T cell lymphoma   03/23/2013 Procedure She has port placement   03/26/2013 Imaging Echo showed normal ejection fraction   03/29/2013 Bone Marrow Biopsy Bone marrow biopsy was negative   04/02/2013 Imaging PET/CT scan showed localized, stage II disease   04/04/2013 - 07/20/2013 Chemotherapy She has completed 6 cycles of CHOP plus etoposide chemotherapy.   06/01/2013 Imaging PET CT scan show complete response to treatment   06/27/2013 Adverse Reaction Dose of vincristine was reduced due to peripheral neuropathy.   08/31/2013 Imaging Repeat PET scans show no evidence of disease.    INTERVAL HISTORY: Please see below for problem oriented charting. She returns for further follow-up. She complained of a toe nail that is not healing. She also have complaints of recent upper respiratory tract infection but has not started any antibiotic treatment. She denies new lymphadenopathy.  REVIEW OF SYSTEMS:   Constitutional: Denies fevers, chills or abnormal weight loss Eyes: Denies blurriness of vision Respiratory: Denies cough, dyspnea or wheezes Cardiovascular: Denies palpitation, chest discomfort or lower extremity swelling Gastrointestinal:  Denies nausea, heartburn or change in bowel habits Skin: Denies abnormal skin rashes Lymphatics: Denies new lymphadenopathy or easy  bruising Neurological:Denies numbness, tingling or new weaknesses Behavioral/Psych: Mood is stable, no new changes  All other systems were reviewed with the patient and are negative.  I have reviewed the past medical history, past surgical history, social history and family history with the patient and they are unchanged from previous note.  ALLERGIES:  is allergic to betadine and codeine.  MEDICATIONS:  Current Outpatient Prescriptions  Medication Sig Dispense Refill  . aspirin 81 MG tablet Take 81 mg by mouth daily.    Marland Kitchen buPROPion (WELLBUTRIN XL) 150 MG 24 hr tablet Take 1 tablet (150 mg total) by mouth daily. Office visit due now 90 tablet 1  . butalbital-acetaminophen-caffeine (FIORICET) 50-325-40 MG per tablet Take 1 tablet by mouth every 6 (six) hours as needed for headache. 30 tablet 0  . FLUoxetine (PROZAC) 40 MG capsule TAKE 1 CAPSULE BY MOUTH DAILY 30 capsule 2  . lisinopril-hydrochlorothiazide (PRINZIDE,ZESTORETIC) 20-25 MG per tablet 1 tab by mouth daily--Office visit due now 90 tablet 3  . PRILOSEC OTC 20 MG tablet TAKE 1 TABLET (20 MG TOTAL) BY MOUTH DAILY. 30 tablet 10   No current facility-administered medications for this visit.    PHYSICAL EXAMINATION: ECOG PERFORMANCE STATUS: 0 - Asymptomatic  Filed Vitals:   03/12/14 0839  BP: 130/74  Pulse: 84  Temp: 97.8 F (36.6 C)  Resp: 18   Filed Weights   03/12/14 0839  Weight: 226 lb 14.4 oz (102.921 kg)    GENERAL:alert, no distress and comfortable SKIN: skin color, texture, turgor are normal, no rashes or significant lesions. Noted discoloration of the big toenail on the right foot EYES: normal, Conjunctiva are pink and non-injected, sclera clear OROPHARYNX:no exudate, no erythema and  lips, buccal mucosa, and tongue normal  NECK: supple, thyroid normal size, non-tender, without nodularity LYMPH:  no palpable lymphadenopathy in the cervical, axillary or inguinal LUNGS: clear to auscultation and percussion with  normal breathing effort HEART: regular rate & rhythm and no murmurs and no lower extremity edema ABDOMEN:abdomen soft, non-tender and normal bowel sounds Musculoskeletal:no cyanosis of digits and no clubbing  NEURO: alert & oriented x 3 with fluent speech, no focal motor/sensory deficits  LABORATORY DATA:  I have reviewed the data as listed    Component Value Date/Time   NA 139 03/12/2014 0825   NA 140 04/03/2013 1328   K 4.0 03/12/2014 0825   K 4.1 04/03/2013 1328   CL 100 04/03/2013 1328   CO2 25 03/12/2014 0825   CO2 25 04/03/2013 1328   GLUCOSE 96 03/12/2014 0825   GLUCOSE 85 04/03/2013 1328   BUN 14.1 03/12/2014 0825   BUN 13 04/03/2013 1328   CREATININE 0.9 03/12/2014 0825   CREATININE 0.87 04/03/2013 1328   CALCIUM 9.3 03/12/2014 0825   CALCIUM 9.1 04/03/2013 1328   PROT 6.8 03/12/2014 0825   PROT 6.2 04/03/2013 1328   ALBUMIN 3.7 03/12/2014 0825   ALBUMIN 3.9 04/03/2013 1328   AST 27 03/12/2014 0825   AST 16 04/03/2013 1328   ALT 31 03/12/2014 0825   ALT 18 04/03/2013 1328   ALKPHOS 154* 03/12/2014 0825   ALKPHOS 164* 04/03/2013 1328   BILITOT 0.68 03/12/2014 0825   BILITOT 0.5 04/03/2013 1328   GFRNONAA 77* 03/23/2013 0953   GFRAA 90* 03/23/2013 0953    No results found for: SPEP, UPEP  Lab Results  Component Value Date   WBC 5.3 03/12/2014   NEUTROABS 3.8 03/12/2014   HGB 13.0 03/12/2014   HCT 40.0 03/12/2014   MCV 88.5 03/12/2014   PLT 259 03/12/2014      Chemistry      Component Value Date/Time   NA 139 03/12/2014 0825   NA 140 04/03/2013 1328   K 4.0 03/12/2014 0825   K 4.1 04/03/2013 1328   CL 100 04/03/2013 1328   CO2 25 03/12/2014 0825   CO2 25 04/03/2013 1328   BUN 14.1 03/12/2014 0825   BUN 13 04/03/2013 1328   CREATININE 0.9 03/12/2014 0825   CREATININE 0.87 04/03/2013 1328      Component Value Date/Time   CALCIUM 9.3 03/12/2014 0825   CALCIUM 9.1 04/03/2013 1328   ALKPHOS 154* 03/12/2014 0825   ALKPHOS 164* 04/03/2013 1328    AST 27 03/12/2014 0825   AST 16 04/03/2013 1328   ALT 31 03/12/2014 0825   ALT 18 04/03/2013 1328   BILITOT 0.68 03/12/2014 0825   BILITOT 0.5 04/03/2013 1328      ASSESSMENT & PLAN:  Lymphoma, T-cell Clinically, she has no signs of disease recurrence. Plan to see her back in 3 months with history, physical examination and blood work.   ONYCHOMYCOSIS, TOENAILS I recommend we continue to monitor the toenail closely. I suspect it would improve over the next 6 months. If not, she may need treatment for possible fungal infection.   URI (upper respiratory infection) She has symptoms of recurrent upper respiratory tract infection which I suspect is virus in nature. If it does not resolve, I recommend ENT consultation.    Orders Placed This Encounter  Procedures  . CBC with Differential/Platelet    Standing Status: Standing     Number of Occurrences: 9     Standing Expiration Date: 03/13/2015  .  Comprehensive metabolic panel    Standing Status: Standing     Number of Occurrences: 9     Standing Expiration Date: 03/13/2015  . Lactate dehydrogenase    Standing Status: Standing     Number of Occurrences: 9     Standing Expiration Date: 03/13/2015  . Lipid panel    Standing Status: Future     Number of Occurrences:      Standing Expiration Date: 03/13/2015   All questions were answered. The patient knows to call the clinic with any problems, questions or concerns. No barriers to learning was detected. I spent 15 minutes counseling the patient face to face. The total time spent in the appointment was 20 minutes and more than 50% was on counseling and review of test results     [facility], [place], MD 03/12/2014 9:37 AM

## 2014-03-12 NOTE — Telephone Encounter (Signed)
s.w. pt and advised on April appt.....pt ok and aware °

## 2014-03-12 NOTE — Assessment & Plan Note (Signed)
She has symptoms of recurrent upper respiratory tract infection which I suspect is virus in nature. If it does not resolve, I recommend ENT consultation.

## 2014-03-25 ENCOUNTER — Other Ambulatory Visit: Payer: Self-pay | Admitting: Orthopaedic Surgery

## 2014-03-25 DIAGNOSIS — M25561 Pain in right knee: Secondary | ICD-10-CM

## 2014-03-28 ENCOUNTER — Ambulatory Visit
Admission: RE | Admit: 2014-03-28 | Discharge: 2014-03-28 | Disposition: A | Discharge status: Home / Self Care | Payer: BLUE CROSS/BLUE SHIELD | Source: Ambulatory Visit | Attending: Orthopaedic Surgery | Admitting: Orthopaedic Surgery

## 2014-03-28 DIAGNOSIS — M25561 Pain in right knee: Secondary | ICD-10-CM

## 2014-03-29 ENCOUNTER — Telehealth: Payer: Self-pay | Admitting: *Deleted

## 2014-03-29 ENCOUNTER — Ambulatory Visit (HOSPITAL_BASED_OUTPATIENT_CLINIC_OR_DEPARTMENT_OTHER): Payer: BLUE CROSS/BLUE SHIELD | Admitting: Nurse Practitioner

## 2014-03-29 VITALS — BP 126/76 | HR 64 | Temp 97.6°F | Resp 20 | Wt 227.9 lb

## 2014-03-29 DIAGNOSIS — C8464 Anaplastic large cell lymphoma, ALK-positive, lymph nodes of axilla and upper limb: Secondary | ICD-10-CM

## 2014-03-29 DIAGNOSIS — B029 Zoster without complications: Secondary | ICD-10-CM

## 2014-03-29 DIAGNOSIS — C8590 Non-Hodgkin lymphoma, unspecified, unspecified site: Secondary | ICD-10-CM

## 2014-03-29 MED ORDER — VALACYCLOVIR HCL 1 G PO TABS: 1000.0000 mg | ORAL_TABLET | Freq: Three times a day (TID) | ORAL | Status: DC

## 2014-03-29 NOTE — Telephone Encounter (Signed)
Spoke with patient.  She will come today at 3pm to see Selena Lesser NP

## 2014-03-29 NOTE — Telephone Encounter (Signed)
Patient called.  She has been having pain and burning in her right shoulder for one week.  It seems to be getting a little worse.  She rates it a 6 or 7/10.  She describes the pain as burning and itching.  She denies any rash, but says she has been told there is a very small bruise there, but she does not remember injuring that area.  She would like to be seen.  "Dr. Alvy Bimler topld me to always call her first.".  Discussed with Selena Lesser NP.  OK to come in and see her this afternoon @ 2:15pm.   Called patient back. No answer.  LM to call us back.

## 2014-03-31 ENCOUNTER — Encounter: Payer: Self-pay | Admitting: Nurse Practitioner

## 2014-03-31 NOTE — Assessment & Plan Note (Signed)
Pt c/o right shoulder and arm pain, burning, itching for past several days. Denies any known injury or trauma to shoulde ror arm.  Denies any chest pain, chest presusre, SOB, or pain with inspiration. On exam-  Patient has 3 small vesicular lesions to right posterior shoulder area.   Pt afebrile and nontoxic on exam.    Will treat pt for possible mild shingles with Valacyclovir x 7 days. Pt states that she already has pain meds to take at home if needed.   Advised pt to call/return or go directly to ED for any worsening symptoms whatsoever.

## 2014-03-31 NOTE — Assessment & Plan Note (Signed)
Patient is status post CHOP/etoposide chemotherapy completed June 2015. Most recent restaging scans showed no recurrence. She will continue under observation.   Patient is scheduled to return for labs and follow up visit on 06/07/14.

## 2014-03-31 NOTE — Progress Notes (Signed)
SYMPTOM MANAGEMENT CLINIC   HPI: Anna Jackson 54 y.o. female diagnosed with lymphoma.  Pt is status post CHOP/etoposide chemotherapy completed in June 2015.  Currently undergoing observation only.   Pt c/o intense pain, burning, and itching to right shoulder area for past several days. Denies any other symptoms. Denies any recent fever or chills.   HPI  CURRENT THERAPY: No active treatment plan for patient.   ROS  Past Medical History  Diagnosis Date  . Hypertension   . Anxiety   . Esophageal reflux   . Anxiety state, unspecified   . Herpes simplex without mention of complication   . Unspecified essential hypertension   . Barrett esophagus   . Anemia   . Asthma     no problem with asthma in several years  . Unspecified asthma(493.90)   . Adenopathy     left axillary  . 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 Mental disorder   . Depression   . Headache 05/16/2013  . Skin infection 11/02/2013  . URI (upper respiratory infection) 01/15/2014  . Hyperlipidemia 03/12/2014    Past Surgical History  Procedure Laterality Date  . Cholecystectomy      with hernia repair  . Abdominal hysterectomy  2006  . Nissen fundoplication    . Cervical conization w/bx    . Tubal ligation    . Knee surgery  02/2010    Right--arthroscopic  . Axillary lymph node dissection Left 03/09/2013    Procedure: EXCISION LEFT AXILLARY LYMPH NODES;  Surgeon: [name], MD;  Location: [facility];  Service: General;  Laterality: Left;  . Portacath placement N/A 03/23/2013    Procedure: INSERTION PORT-A-CATH;  Surgeon: [name], MD;  Location: [facility];  Service: General;  Laterality: N/A;    has HERPES SIMPLEX, UNCOMPLICATED; ONYCHOMYCOSIS, TOENAILS; ANXIETY; DEPRESSION; HYPERTENSION; ASTHMA; GERD; Pain in Joint, Lower Leg; HEADACHE; Anemia in neoplastic disease; Personal History of Other Diseases of Digestive Disease; UMBILICAL HERNIORRHAPHY, HX OF; POSTMENOPAUSAL STATUS; Hemorrhoid; Heme positive  stool; Barrett's esophagus; Morbid obesity; Lymphoma, T-cell; Headache; Neuropathy due to chemotherapeutic drug; Obesity (BMI 30-39.9); URI (upper respiratory infection); Hyperlipidemia; and Shingles on her problem list.    is allergic to betadine and codeine.    Medication List       This list is accurate as of: 03/29/14 11:59 PM.  Always use your most recent med list.               aspirin 81 MG tablet  Take 81 mg by mouth daily.     buPROPion 150 MG 24 hr tablet  Commonly known as:  WELLBUTRIN XL  Take 1 tablet (150 mg total) by mouth daily. Office visit due now     butalbital-acetaminophen-caffeine 50-325-40 MG per tablet  Commonly known as:  FIORICET  Take 1 tablet by mouth every 6 (six) hours as needed for headache.     FLUoxetine 40 MG capsule  Commonly known as:  PROZAC  TAKE 1 CAPSULE BY MOUTH DAILY     lisinopril-hydrochlorothiazide 20-25 MG per tablet  Commonly known as:  PRINZIDE,ZESTORETIC  1 tab by mouth daily--Office visit due now     PRILOSEC OTC 20 MG tablet  Generic drug:  omeprazole  TAKE 1 TABLET (20 MG TOTAL) BY MOUTH DAILY.     valACYclovir 1000 MG tablet  Commonly known as:  VALTREX  Take 1 tablet (1,000 mg total) by mouth 3 (three) times daily.         PHYSICAL EXAMINATION  Blood pressure 126/76, pulse 64, temperature 97.6 F (36.4 C), temperature source Oral, resp. rate 20, weight 227 lb 14.4 oz (103.375 kg).  Physical Exam  Constitutional: She is oriented to person, place, and time and well-developed, well-nourished, and in no distress.  HENT:  Head: Normocephalic and atraumatic.  Mouth/Throat: Oropharynx is clear and moist.  Eyes: Conjunctivae and EOM are normal. Pupils are equal, round, and reactive to light. Right eye exhibits no discharge. Left eye exhibits no discharge. No scleral icterus.  Neck: Normal range of motion.  Cardiovascular: Normal rate, regular rhythm, normal heart sounds and intact distal pulses.   Pulmonary/Chest:  Effort normal and breath sounds normal. No respiratory distress. She has no wheezes. She has no rales. She exhibits no tenderness.  Abdominal: Soft. Bowel sounds are normal. She exhibits no distension and no mass. There is no tenderness. There is no rebound and no guarding.  Musculoskeletal: Normal range of motion. She exhibits tenderness. She exhibits no edema.  Mild tenderness to right posterior shoulder area with palpation; but no visible injury to area.   Neurological: She is alert and oriented to person, place, and time. Gait normal.  Skin: Skin is warm and dry. Rash noted. No erythema. No pallor.  Pt has 3 tiny vesicular lesions to right posterior upper shoulder area.   Psychiatric: Affect normal.  Nursing note and vitals reviewed.   LABORATORY DATA:. No visits with results within 3 Day(s) from this visit. Latest known visit with results is:  Appointment on 03/12/2014  Component Date Value Ref Range Status  . LDH 03/12/2014 224  125 - 245 U/L Final  . WBC 03/12/2014 5.3  3.9 - 10.3 10e3/uL Final  . NEUT# 03/12/2014 3.8  1.5 - 6.5 10e3/uL Final  . HGB 03/12/2014 13.0  11.6 - 15.9 g/dL Final  . HCT 03/12/2014 40.0  34.8 - 46.6 % Final  . Platelets 03/12/2014 259  145 - 400 10e3/uL Final  . MCV 03/12/2014 88.5  79.5 - 101.0 fL Final  . MCH 03/12/2014 28.8  25.1 - 34.0 pg Final  . MCHC 03/12/2014 32.5  31.5 - 36.0 g/dL Final  . RBC 03/12/2014 4.52  3.70 - 5.45 10e6/uL Final  . RDW 03/12/2014 13.4  11.2 - 14.5 % Final  . lymph# 03/12/2014 1.1  0.9 - 3.3 10e3/uL Final  . MONO# 03/12/2014 0.4  0.1 - 0.9 10e3/uL Final  . Eosinophils Absolute 03/12/2014 0.1  0.0 - 0.5 10e3/uL Final  . Basophils Absolute 03/12/2014 0.0  0.0 - 0.1 10e3/uL Final  . NEUT% 03/12/2014 70.2  38.4 - 76.8 % Final  . LYMPH% 03/12/2014 20.8  14.0 - 49.7 % Final  . MONO% 03/12/2014 6.9  0.0 - 14.0 % Final  . EOS% 03/12/2014 1.7  0.0 - 7.0 % Final  . BASO% 03/12/2014 0.4  0.0 - 2.0 % Final  . Sodium 03/12/2014  139  136 - 145 mEq/L Final  . Potassium 03/12/2014 4.0  3.5 - 5.1 mEq/L Final  . Chloride 03/12/2014 104  98 - 109 mEq/L Final  . CO2 03/12/2014 25  22 - 29 mEq/L Final  . Glucose 03/12/2014 96  70 - 140 mg/dl Final  . BUN 03/12/2014 14.1  7.0 - 26.0 mg/dL Final  . Creatinine 03/12/2014 0.9  0.6 - 1.1 mg/dL Final  . Total Bilirubin 03/12/2014 0.68  0.20 - 1.20 mg/dL Final  . Alkaline Phosphatase 03/12/2014 154* 40 - 150 U/L Final  . AST 03/12/2014 27  5 - 34 U/L Final  .  ALT 03/12/2014 31  0 - 55 U/L Final  . Total Protein 03/12/2014 6.8  6.4 - 8.3 g/dL Final  . Albumin 03/12/2014 3.7  3.5 - 5.0 g/dL Final  . Calcium 03/12/2014 9.3  8.4 - 10.4 mg/dL Final  . Anion Gap 03/12/2014 10  3 - 11 mEq/L Final  . EGFR 03/12/2014 72* >90 ml/min/1.73 m2 Final   eGFR is calculated using the CKD-EPI Creatinine Equation (2009)     RADIOGRAPHIC STUDIES: Mr Knee Right Wo Contrast  03/29/2014   CLINICAL DATA:  Right knee pain and swelling for 1 month. Popping noise is walking. No known injury.  EXAM: MRI OF THE RIGHT KNEE WITHOUT CONTRAST  TECHNIQUE: Multiplanar, multisequence MR imaging of the knee was performed. No intravenous contrast was administered.  COMPARISON:  02/04/2010  FINDINGS: MENISCI  Medial meniscus: Irregularity and attenuation of the posterior horn of the medial meniscus adjacent to the meniscal root likely related to prior meniscal repair and partial meniscectomy. There is a small linear component along the inferior surface which may reflect a small retear.  Lateral meniscus:  Intact.  LIGAMENTS  Cruciates:  Intact ACL and PCL.  Collaterals: Medial collateral ligament is intact. Lateral collateral ligament complex is intact.  CARTILAGE  Patellofemoral: Partial thickness cartilage loss of the medial and lateral patellofemoral compartment.  Medial: Full-thickness cartilage loss of the medial femoral condyle and medial tibial plateau with subchondral reactive marrow changes and marginal  osteophytosis.  Lateral:  No focal chondral defect.  Joint: No significant joint effusion. No plical thickening. Normal Hoffa's fat. Mild edema in the anterior suprapatellar fat pad.  Popliteal Fossa:  No Baker cyst.  Intact popliteus tendon.  Extensor Mechanism:  Intact.  Bones: No focal marrow signal abnormality. No fracture or dislocation. Visualized muscles are normal in signal.  IMPRESSION: 1. Irregularity and attenuation of the posterior horn of the medial meniscus adjacent to the meniscal root likely related to prior meniscal repair and partial meniscectomy. There is a small linear component along the inferior surface which may reflect a small retear. 2. Full-thickness cartilage loss of the medial femoral condyle and medial tibial plateau with subchondral reactive marrow changes and marginal osteophytosis. This has significantly progressed compared with 02/04/2010. 3. Partial-thickness cartilage loss the medial and lateral patellar compartment.   Electronically Signed   By: Kathreen Devoid   On: 03/29/2014 08:30    ASSESSMENT/PLAN:    Lymphoma, T-cell Patient is status post CHOP/etoposide chemotherapy completed June 2015. Most recent restaging scans showed no recurrence. She will continue under observation.   Patient is scheduled to return for labs and follow up visit on 06/07/14.    Shingles Pt c/o right shoulder and arm pain, burning, itching for past several days. Denies any known injury or trauma to shoulde ror arm.  Denies any chest pain, chest presusre, SOB, or pain with inspiration. On exam-  Patient has 3 small vesicular lesions to right posterior shoulder area.   Pt afebrile and nontoxic on exam.    Will treat pt for possible mild shingles with Valacyclovir x 7 days. Pt states that she already has pain meds to take at home if needed.   Advised pt to call/return or go directly to ED for any worsening symptoms whatsoever.      Patient stated understanding of all instructions; and was  in agreement with this plan of care. The patient knows to call the clinic with any problems, questions or concerns.   Review/collaboration with Dr. Alvy Bimler regarding all aspects  of patient's visit today.   Total time spent with patient was 25 minutes;  with greater than 75 percent of that time spent in face to face counseling regarding patient's symptoms,  and coordination of care and follow up.  Disclaimer: This note was dictated with voice recognition software. Similar sounding words can inadvertently be transcribed and may not be corrected upon review.   Drue Second, NP 03/31/2014

## 2014-04-01 ENCOUNTER — Telehealth: Payer: Self-pay | Admitting: *Deleted

## 2014-04-01 NOTE — Telephone Encounter (Signed)
LVM for callback re: f/u on Shingles and if any changes over the weekend.

## 2014-04-04 ENCOUNTER — Telehealth: Payer: Self-pay | Admitting: *Deleted

## 2014-04-04 ENCOUNTER — Encounter: Payer: Self-pay | Admitting: Hematology and Oncology

## 2014-04-04 ENCOUNTER — Other Ambulatory Visit: Payer: Self-pay | Admitting: Hematology and Oncology

## 2014-04-04 DIAGNOSIS — M792 Neuralgia and neuritis, unspecified: Secondary | ICD-10-CM | POA: Insufficient documentation

## 2014-04-04 HISTORY — DX: Neuralgia and neuritis, unspecified: M79.2

## 2014-04-04 MED ORDER — HYDROMORPHONE HCL 2 MG PO TABS: 2.0000 mg | ORAL_TABLET | ORAL | Status: DC | PRN

## 2014-04-04 MED ORDER — GABAPENTIN 300 MG PO CAPS: 300.0000 mg | ORAL_CAPSULE | Freq: Three times a day (TID) | ORAL | Status: DC

## 2014-04-04 NOTE — Telephone Encounter (Signed)
Please ask her to stop by to pick up prescriptions

## 2014-04-04 NOTE — Telephone Encounter (Signed)
NOTIFIED PT. TO COME TO THIS OFFICE TO PICK UP HER PRESCRIPTIONS. SHE VOICES UNDERSTANDING.

## 2014-04-04 NOTE — Telephone Encounter (Signed)
THE PAIN IS IN HER RIGHT SHOULDER, ARM AND HAS MOVED AROUND INTO HER RIGHT BREAST. IT IS CONSTANT AT A SCALE OF SEVEN. PT. DOES NOT HAVE ANYTHING FOR PAIN. HER PHARMACY IS [facility].

## 2014-04-11 ENCOUNTER — Other Ambulatory Visit: Payer: Self-pay | Admitting: Family Medicine

## 2014-04-11 DIAGNOSIS — Z1231 Encounter for screening mammogram for malignant neoplasm of breast: Secondary | ICD-10-CM

## 2014-04-12 ENCOUNTER — Telehealth: Payer: Self-pay | Admitting: Family Medicine

## 2014-04-12 MED ORDER — FLUOXETINE HCL 40 MG PO CAPS: ORAL_CAPSULE | ORAL | Status: DC

## 2014-04-12 NOTE — Telephone Encounter (Signed)
Caller name: Kenita Relation to pt: self Call back number: [phone] Pharmacy: [facility] [place]  Reason for call:   Requesting a 90 day supply of prozac. She thinks her insurance will cover better at 90 days.

## 2014-04-15 ENCOUNTER — Ambulatory Visit: Payer: BLUE CROSS/BLUE SHIELD

## 2014-05-07 ENCOUNTER — Telehealth: Payer: Self-pay | Admitting: Family Medicine

## 2014-05-07 MED ORDER — FLUOXETINE HCL 20 MG PO TABS: 40.0000 mg | ORAL_TABLET | Freq: Every day | ORAL | Status: DC

## 2014-05-07 NOTE — Telephone Encounter (Signed)
Ok to change to prozac 20 mg 2 po qd #180  3 refills

## 2014-05-07 NOTE — Telephone Encounter (Signed)
Rx faxed.    KP 

## 2014-05-07 NOTE — Telephone Encounter (Signed)
Please advise      KP 

## 2014-05-07 NOTE — Telephone Encounter (Signed)
Caller name: Nakira, Litzau C Relation to pt: self  Call back number: [phone] Pharmacy: [id number] [place], [place]  Reason for call:  As per insurance does not cover FLUoxetine (PROZAC) 40 MG capsule, pt requesting to 20MG . Please advise [employer] under [facility]

## 2014-05-21 ENCOUNTER — Ambulatory Visit (INDEPENDENT_AMBULATORY_CARE_PROVIDER_SITE_OTHER): Payer: PRIVATE HEALTH INSURANCE | Admitting: Family Medicine

## 2014-05-21 ENCOUNTER — Encounter: Payer: Self-pay | Admitting: Family Medicine

## 2014-05-21 VITALS — BP 130/85 | HR 71 | Temp 98.0°F | Wt 226.8 lb

## 2014-05-21 DIAGNOSIS — F419 Anxiety disorder, unspecified: Secondary | ICD-10-CM | POA: Diagnosis not present

## 2014-05-21 DIAGNOSIS — R079 Chest pain, unspecified: Secondary | ICD-10-CM | POA: Diagnosis not present

## 2014-05-21 DIAGNOSIS — R5383 Other fatigue: Secondary | ICD-10-CM

## 2014-05-21 LAB — POCT URINALYSIS DIPSTICK
Bilirubin, UA: NEGATIVE
GLUCOSE UA: NEGATIVE
Ketones, UA: NEGATIVE
Leukocytes, UA: NEGATIVE
NITRITE UA: NEGATIVE
Protein, UA: NEGATIVE
SPEC GRAV UA: 1.02
Urobilinogen, UA: 1
pH, UA: 6

## 2014-05-21 MED ORDER — ALPRAZOLAM 0.25 MG PO TABS: 0.2500 mg | ORAL_TABLET | Freq: Two times a day (BID) | ORAL | Status: DC | PRN

## 2014-05-21 NOTE — Progress Notes (Signed)
Check labs.  Adjust meds prn Patient ID: Anna Jackson, female    DOB: 02-Jan-1961  Age: 54 y.o. MRN: [id number]    Subjective:  Subjective HPI Anna Jackson presents for chest pain and pressure.    It started last night and still has not gone away.  She also c/o extreme fatigue.      Review of Systems  Constitutional: Positive for fatigue. Negative for activity change, appetite change and unexpected weight change.  Respiratory: Negative for cough and shortness of breath.   Cardiovascular: Positive for chest pain. Negative for palpitations and leg swelling.  Psychiatric/Behavioral: Negative for behavioral problems and dysphoric mood. The patient is not nervous/anxious.     History Past Medical History  Diagnosis Date  . Hypertension   . Anxiety   . Esophageal reflux   . Anxiety state, unspecified   . Herpes simplex without mention of complication   . Unspecified essential hypertension   . Barrett esophagus   . Anemia   . Asthma     no problem with asthma in several years  . Unspecified asthma(493.90)   . Adenopathy     left axillary  . 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 Mental disorder   . Depression   . Headache 05/16/2013  . Skin infection 11/02/2013  . URI (upper respiratory infection) 01/15/2014  . Hyperlipidemia 03/12/2014  . Neuropathic pain 04/04/2014    She has past surgical history that includes Cholecystectomy; Abdominal hysterectomy (2006); Nissen fundoplication; Cervical conization w/bx; Tubal ligation; Knee surgery (02/2010); Axillary lymph node dissection (Left, 03/09/2013); and Portacath placement (N/A, 03/23/2013).   Her family history includes Breast cancer in her maternal grandmother; Cancer (age of onset: 53) in her maternal grandmother; Coronary artery disease in an other family member; Heart disease in her mother; Hypertension in an other family member; Migraines in an other family member. There is no history of Colon cancer.She reports that she has never  smoked. She has never used smokeless tobacco. She reports that she drinks alcohol. She reports that she does not use illicit drugs.  Current Outpatient Prescriptions on File Prior to Visit  Medication Sig Dispense Refill  . aspirin 81 MG tablet Take 81 mg by mouth daily.    Marland Kitchen buPROPion (WELLBUTRIN XL) 150 MG 24 hr tablet Take 1 tablet (150 mg total) by mouth daily. Office visit due now 90 tablet 1  . FLUoxetine (PROZAC) 20 MG tablet Take 2 tablets (40 mg total) by mouth daily. 180 tablet 3  . gabapentin (NEURONTIN) 300 MG capsule Take 1 capsule (300 mg total) by mouth 3 (three) times daily. 90 capsule 0  . HYDROmorphone (DILAUDID) 2 MG tablet Take 1 tablet (2 mg total) by mouth every 4 (four) hours as needed for severe pain. 30 tablet 0  . lisinopril-hydrochlorothiazide (PRINZIDE,ZESTORETIC) 20-25 MG per tablet 1 tab by mouth daily--Office visit due now 90 tablet 3  . PRILOSEC OTC 20 MG tablet TAKE 1 TABLET (20 MG TOTAL) BY MOUTH DAILY. 30 tablet 10  . valACYclovir (VALTREX) 1000 MG tablet Take 1 tablet (1,000 mg total) by mouth 3 (three) times daily. 21 tablet 0   No current facility-administered medications on file prior to visit.     Objective:  Objective Physical Exam  Constitutional: She is oriented to person, place, and time. She appears well-developed and well-nourished. No distress.  HENT:  Right Ear: External ear normal.  Left Ear: External ear normal.  Nose: Nose normal.  Mouth/Throat: Oropharynx is clear and moist.  Eyes:  EOM are normal. Pupils are equal, round, and reactive to light.  Neck: Normal range of motion. Neck supple.  Cardiovascular: Normal rate, regular rhythm and normal heart sounds.   No murmur heard. Pulmonary/Chest: Effort normal and breath sounds normal. No respiratory distress. She has no wheezes. She has no rales. She exhibits no tenderness.  Neurological: She is alert and oriented to person, place, and time.  Psychiatric: She has a normal mood and affect.  Her behavior is normal. Judgment and thought content normal.   BP 130/85 mmHg  Pulse 71  Temp(Src) 98 F (36.7 C) (Oral)  Wt 226 lb 12.8 oz (102.876 kg)  SpO2 100% Wt Readings from Last 3 Encounters:  05/21/14 226 lb 12.8 oz (102.876 kg)  03/29/14 227 lb 14.4 oz (103.375 kg)  03/28/14 225 lb (102.059 kg)   ekg--nsr  Lab Results  Component Value Date   WBC 5.3 03/12/2014   HGB 13.0 03/12/2014   HCT 40.0 03/12/2014   PLT 259 03/12/2014   GLUCOSE 96 03/12/2014   CHOL 165 03/27/2012   TRIG 77.0 03/27/2012   HDL 66.40 03/27/2012   LDLDIRECT 105.0 11/23/2010   LDLCALC 83 03/27/2012   ALT 31 03/12/2014   AST 27 03/12/2014   NA 139 03/12/2014   K 4.0 03/12/2014   CL 100 04/03/2013   CREATININE 0.9 03/12/2014   BUN 14.1 03/12/2014   CO2 25 03/12/2014   TSH 0.81 03/27/2012   INR 0.99 03/29/2013    Mr Knee Right Wo Contrast  03/29/2014   CLINICAL DATA:  Right knee pain and swelling for 1 month. Popping noise is walking. No known injury.  EXAM: MRI OF THE RIGHT KNEE WITHOUT CONTRAST  TECHNIQUE: Multiplanar, multisequence MR imaging of the knee was performed. No intravenous contrast was administered.  COMPARISON:  02/04/2010  FINDINGS: MENISCI  Medial meniscus: Irregularity and attenuation of the posterior horn of the medial meniscus adjacent to the meniscal root likely related to prior meniscal repair and partial meniscectomy. There is a small linear component along the inferior surface which may reflect a small retear.  Lateral meniscus:  Intact.  LIGAMENTS  Cruciates:  Intact ACL and PCL.  Collaterals: Medial collateral ligament is intact. Lateral collateral ligament complex is intact.  CARTILAGE  Patellofemoral: Partial thickness cartilage loss of the medial and lateral patellofemoral compartment.  Medial: Full-thickness cartilage loss of the medial femoral condyle and medial tibial plateau with subchondral reactive marrow changes and marginal osteophytosis.  Lateral:  No focal chondral  defect.  Joint: No significant joint effusion. No plical thickening. Normal Hoffa's fat. Mild edema in the anterior suprapatellar fat pad.  Popliteal Fossa:  No Baker cyst.  Intact popliteus tendon.  Extensor Mechanism:  Intact.  Bones: No focal marrow signal abnormality. No fracture or dislocation. Visualized muscles are normal in signal.  IMPRESSION: 1. Irregularity and attenuation of the posterior horn of the medial meniscus adjacent to the meniscal root likely related to prior meniscal repair and partial meniscectomy. There is a small linear component along the inferior surface which may reflect a small retear. 2. Full-thickness cartilage loss of the medial femoral condyle and medial tibial plateau with subchondral reactive marrow changes and marginal osteophytosis. This has significantly progressed compared with 02/04/2010. 3. Partial-thickness cartilage loss the medial and lateral patellar compartment.   Electronically Signed   By: Kathreen Devoid   On: 03/29/2014 08:30     Assessment & Plan:  Plan I am having Ms. Graeff start on ALPRAZolam. I am also having  her maintain her PRILOSEC OTC, buPROPion, aspirin, lisinopril-hydrochlorothiazide, valACYclovir, gabapentin, HYDROmorphone, and FLUoxetine.  Meds ordered this encounter  Medications  . ALPRAZolam (XANAX) 0.25 MG tablet    Sig: Take 1 tablet (0.25 mg total) by mouth 2 (two) times daily as needed for anxiety.    Dispense:  90 tablet    Refill:  0    Problem List Items Addressed This Visit    None    Visit Diagnoses    Chest pain, unspecified chest pain type    -  Primary    Relevant Orders    EKG 12-Lead (Completed)    Basic metabolic panel    CBC with Differential/Platelet    Hepatic function panel    Vitamin B12    Vitamin D 1,25 dihydroxy    TSH    D-Dimer, Quantitative    Troponin I    POCT urinalysis dipstick (Completed)    Other fatigue        Relevant Orders    Basic metabolic panel    CBC with  Differential/Platelet    Hepatic function panel    Vitamin B12    Vitamin D 1,25 dihydroxy    TSH    D-Dimer, Quantitative    Troponin I    POCT urinalysis dipstick (Completed)    Anxiety        Relevant Medications    ALPRAZolam  (XANAX) tablet       Follow-up: Return if symptoms worsen or fail to improve.  Garnet Koyanagi, DO

## 2014-05-21 NOTE — Progress Notes (Signed)
Pre visit review using our clinic review tool, if applicable. No additional management support is needed unless otherwise documented below in the visit note. 

## 2014-05-21 NOTE — Patient Instructions (Signed)

## 2014-05-22 ENCOUNTER — Telehealth: Payer: Self-pay | Admitting: Family Medicine

## 2014-05-22 ENCOUNTER — Other Ambulatory Visit (INDEPENDENT_AMBULATORY_CARE_PROVIDER_SITE_OTHER): Payer: PRIVATE HEALTH INSURANCE

## 2014-05-22 DIAGNOSIS — R079 Chest pain, unspecified: Secondary | ICD-10-CM

## 2014-05-22 DIAGNOSIS — R5383 Other fatigue: Secondary | ICD-10-CM

## 2014-05-22 LAB — BASIC METABOLIC PANEL
BUN: 14 mg/dL (ref 6–23)
CO2: 30 mEq/L (ref 19–32)
CREATININE: 0.89 mg/dL (ref 0.40–1.20)
Calcium: 9.8 mg/dL (ref 8.4–10.5)
Chloride: 98 mEq/L (ref 96–112)
GFR: 70.92 mL/min (ref >60.00)
Glucose, Bld: 70 mg/dL (ref 70–99)
POTASSIUM: 3.4 meq/L — AB (ref 3.5–5.1)
Sodium: 136 mEq/L (ref 135–145)

## 2014-05-22 LAB — CBC WITH DIFFERENTIAL/PLATELET
BASOS ABS: 0 10*3/uL (ref 0.0–0.1)
BASOS PCT: 0.6 % (ref 0.0–3.0)
Eosinophils Absolute: 0.1 10*3/uL (ref 0.0–0.7)
Eosinophils Relative: 1 % (ref 0.0–5.0)
HCT: 38.2 % (ref 36.0–46.0)
Hemoglobin: 13 g/dL (ref 12.0–15.0)
LYMPHS PCT: 20.3 % (ref 12.0–46.0)
Lymphs Abs: 1 10*3/uL (ref 0.7–4.0)
MCHC: 34.1 g/dL (ref 30.0–36.0)
MCV: 85.2 fl (ref 78.0–100.0)
MONO ABS: 0.5 10*3/uL (ref 0.1–1.0)
Monocytes Relative: 9.7 % (ref 3.0–12.0)
NEUTROS PCT: 68.4 % (ref 43.0–77.0)
Neutro Abs: 3.5 10*3/uL (ref 1.4–7.7)
PLATELETS: 265 10*3/uL (ref 150.0–400.0)
RBC: 4.48 Mil/uL (ref 3.87–5.11)
RDW: 14.6 % (ref 11.5–15.5)
WBC: 5.1 10*3/uL (ref 4.0–10.5)

## 2014-05-22 LAB — HEPATIC FUNCTION PANEL
ALT: 17 U/L (ref 0–35)
AST: 15 U/L (ref 0–37)
Albumin: 4 g/dL (ref 3.5–5.2)
Alkaline Phosphatase: 143 U/L — ABNORMAL HIGH (ref 39–117)
BILIRUBIN TOTAL: 0.7 mg/dL (ref 0.2–1.2)
Bilirubin, Direct: 0.2 mg/dL (ref 0.0–0.3)
TOTAL PROTEIN: 7.1 g/dL (ref 6.0–8.3)

## 2014-05-22 LAB — TROPONIN I: TNIDX: 0.01 ug/L (ref 0.00–0.06)

## 2014-05-22 LAB — VITAMIN B12: Vitamin B-12: 197 pg/mL — ABNORMAL LOW (ref 211–911)

## 2014-05-22 LAB — TSH: TSH: 1.13 u[IU]/mL (ref 0.35–4.50)

## 2014-05-22 LAB — D-DIMER, QUANTITATIVE (NOT AT ARMC): D DIMER QUANT: 0.27 ug{FEU}/mL (ref 0.00–0.48)

## 2014-05-22 NOTE — Telephone Encounter (Signed)
Caller name: [name] Moffitt. Relation to pt: Self  Call back number: [phone] Pharmacy:  Reason for call: Pt was calling requesting to know lab results done from today 05-22-14 it is concerning about her heart and blood clot, and pt was really worry. Please advise. ASAP.

## 2014-05-23 NOTE — Telephone Encounter (Signed)
Please advise on Lab results.      KP

## 2014-05-23 NOTE — Telephone Encounter (Signed)
See labs 

## 2014-05-23 NOTE — Telephone Encounter (Signed)
Already done.     KP

## 2014-05-24 ENCOUNTER — Ambulatory Visit (INDEPENDENT_AMBULATORY_CARE_PROVIDER_SITE_OTHER): Payer: PRIVATE HEALTH INSURANCE | Admitting: *Deleted

## 2014-05-24 DIAGNOSIS — E538 Deficiency of other specified B group vitamins: Secondary | ICD-10-CM

## 2014-05-24 MED ORDER — CYANOCOBALAMIN 1000 MCG/ML IJ SOLN
1000.0000 ug | Freq: Once | INTRAMUSCULAR | Status: AC
  Administered 2014-05-24: 1000 ug via INTRAMUSCULAR

## 2014-05-24 NOTE — Progress Notes (Signed)
Pre visit review using our clinic review tool, if applicable. No additional management support is needed unless otherwise documented below in the visit note.  Patient tolerated injection well.  Next injection previously scheduled.

## 2014-05-26 LAB — VITAMIN D 1,25 DIHYDROXY
VITAMIN D 1, 25 (OH) TOTAL: 53 pg/mL (ref 18–72)
VITAMIN D3 1, 25 (OH): 53 pg/mL
Vitamin D2 1, 25 (OH)2: <8 pg/mL

## 2014-05-30 ENCOUNTER — Ambulatory Visit (INDEPENDENT_AMBULATORY_CARE_PROVIDER_SITE_OTHER): Payer: PRIVATE HEALTH INSURANCE | Admitting: *Deleted

## 2014-05-30 DIAGNOSIS — E538 Deficiency of other specified B group vitamins: Secondary | ICD-10-CM

## 2014-05-30 MED ORDER — CYANOCOBALAMIN 1000 MCG/ML IJ SOLN
1000.0000 ug | Freq: Once | INTRAMUSCULAR | Status: AC
  Administered 2014-05-30: 1000 ug via INTRAMUSCULAR

## 2014-05-30 NOTE — Progress Notes (Signed)
Pre visit review using our clinic review tool, if applicable. No additional management support is needed unless otherwise documented below in the visit note.   Patient tolerated injection well.  Next injection previously scheduled.

## 2014-05-31 ENCOUNTER — Ambulatory Visit: Payer: PRIVATE HEALTH INSURANCE

## 2014-06-07 ENCOUNTER — Other Ambulatory Visit (HOSPITAL_BASED_OUTPATIENT_CLINIC_OR_DEPARTMENT_OTHER): Payer: PRIVATE HEALTH INSURANCE

## 2014-06-07 ENCOUNTER — Ambulatory Visit (INDEPENDENT_AMBULATORY_CARE_PROVIDER_SITE_OTHER): Payer: PRIVATE HEALTH INSURANCE | Admitting: *Deleted

## 2014-06-07 DIAGNOSIS — C8590 Non-Hodgkin lymphoma, unspecified, unspecified site: Secondary | ICD-10-CM

## 2014-06-07 DIAGNOSIS — E538 Deficiency of other specified B group vitamins: Secondary | ICD-10-CM | POA: Diagnosis not present

## 2014-06-07 DIAGNOSIS — C8400 Mycosis fungoides, unspecified site: Secondary | ICD-10-CM | POA: Diagnosis not present

## 2014-06-07 DIAGNOSIS — E785 Hyperlipidemia, unspecified: Secondary | ICD-10-CM

## 2014-06-07 LAB — COMPREHENSIVE METABOLIC PANEL (CC13)
ALBUMIN: 3.6 g/dL (ref 3.5–5.0)
ALT: 22 U/L (ref 0–55)
AST: 19 U/L (ref 5–34)
Alkaline Phosphatase: 146 U/L (ref 40–150)
Anion Gap: 10 mEq/L (ref 3–11)
BUN: 15.9 mg/dL (ref 7.0–26.0)
CO2: 25 mEq/L (ref 22–29)
CREATININE: 0.8 mg/dL (ref 0.6–1.1)
Calcium: 9.4 mg/dL (ref 8.4–10.4)
Chloride: 104 mEq/L (ref 98–109)
EGFR: 83 mL/min/{1.73_m2} — AB (ref >90)
Glucose: 94 mg/dl (ref 70–140)
POTASSIUM: 3.9 meq/L (ref 3.5–5.1)
Sodium: 140 mEq/L (ref 136–145)
Total Bilirubin: 0.36 mg/dL (ref 0.20–1.20)
Total Protein: 6.7 g/dL (ref 6.4–8.3)

## 2014-06-07 LAB — CBC WITH DIFFERENTIAL/PLATELET
BASO%: 0.8 % (ref 0.0–2.0)
Basophils Absolute: 0 10*3/uL (ref 0.0–0.1)
EOS ABS: 0.1 10*3/uL (ref 0.0–0.5)
EOS%: 1.3 % (ref 0.0–7.0)
HCT: 38.9 % (ref 34.8–46.6)
HGB: 12.7 g/dL (ref 11.6–15.9)
LYMPH#: 0.9 10*3/uL (ref 0.9–3.3)
LYMPH%: 16.8 % (ref 14.0–49.7)
MCH: 28.4 pg (ref 25.1–34.0)
MCHC: 32.8 g/dL (ref 31.5–36.0)
MCV: 86.7 fL (ref 79.5–101.0)
MONO#: 0.4 10*3/uL (ref 0.1–0.9)
MONO%: 7 % (ref 0.0–14.0)
NEUT%: 74.1 % (ref 38.4–76.8)
NEUTROS ABS: 4.1 10*3/uL (ref 1.5–6.5)
PLATELETS: 278 10*3/uL (ref 145–400)
RBC: 4.48 10*6/uL (ref 3.70–5.45)
RDW: 14.2 % (ref 11.2–14.5)
WBC: 5.5 10*3/uL (ref 3.9–10.3)

## 2014-06-07 LAB — LIPID PANEL
Cholesterol: 205 mg/dL — ABNORMAL HIGH (ref 0–200)
HDL: 73 mg/dL (ref >=46)
LDL CALC: 110 mg/dL — AB (ref 0–99)
Total CHOL/HDL Ratio: 2.8 Ratio
Triglycerides: 108 mg/dL (ref <150)
VLDL: 22 mg/dL (ref 0–40)

## 2014-06-07 LAB — LACTATE DEHYDROGENASE (CC13): LDH: 179 U/L (ref 125–245)

## 2014-06-07 MED ORDER — CYANOCOBALAMIN 1000 MCG/ML IJ SOLN
1000.0000 ug | Freq: Once | INTRAMUSCULAR | Status: AC
  Administered 2014-06-07: 1000 ug via INTRAMUSCULAR

## 2014-06-07 NOTE — Progress Notes (Signed)
Pre visit review using our clinic review tool, if applicable. No additional management support is needed unless otherwise documented below in the visit note.  Patient tolerated injection well.  Next injection previously scheduled.

## 2014-06-10 ENCOUNTER — Ambulatory Visit (HOSPITAL_BASED_OUTPATIENT_CLINIC_OR_DEPARTMENT_OTHER): Payer: PRIVATE HEALTH INSURANCE | Admitting: Hematology and Oncology

## 2014-06-10 ENCOUNTER — Encounter: Payer: Self-pay | Admitting: Hematology and Oncology

## 2014-06-10 VITALS — BP 123/79 | HR 86 | Temp 98.2°F | Resp 19 | Ht 64.0 in | Wt 228.1 lb

## 2014-06-10 DIAGNOSIS — C8400 Mycosis fungoides, unspecified site: Secondary | ICD-10-CM

## 2014-06-10 DIAGNOSIS — E785 Hyperlipidemia, unspecified: Secondary | ICD-10-CM | POA: Diagnosis not present

## 2014-06-10 DIAGNOSIS — C8590 Non-Hodgkin lymphoma, unspecified, unspecified site: Secondary | ICD-10-CM

## 2014-06-10 NOTE — Assessment & Plan Note (Signed)
She has multiple risk factors for cardiac disease. Her most recent LDL is mildly elevated. Recommend increase exercise, dietary modification and weight loss. I felt that that could be rechecked at the end of the year to reassess. Currently, she is on aspirin.

## 2014-06-10 NOTE — Progress Notes (Signed)
[facility] OFFICE PROGRESS NOTE  Patient Care Team: Rosalita Chessman, DO as PCP - General Fanny Skates, MD as Consulting Physician (General Surgery) Heath Lark, MD as Consulting Physician (Hematology and Oncology)  SUMMARY OF ONCOLOGIC HISTORY: Oncology History   Anaplastic Lymphoma, T-cell, 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)   Staging method: AJCC 7th Edition   Clinical: Stage II signed by Heath Lark, MD on 04/03/2013  3:31 PM   Summary: Stage II      Lymphoma, T-cell   02/07/2013 Imaging [place] confirmed enlarged LN   03/09/2013 Procedure LN biopsy confirmed T cell lymphoma   03/23/2013 Procedure She has port placement   03/26/2013 Imaging Echo showed normal ejection fraction   03/29/2013 Bone Marrow Biopsy Bone marrow biopsy was negative   04/02/2013 Imaging PET/CT scan showed localized, stage II disease   04/04/2013 - 07/20/2013 Chemotherapy She has completed 6 cycles of CHOP plus etoposide chemotherapy.   06/01/2013 Imaging PET CT scan show complete response to treatment   06/27/2013 Adverse Reaction Dose of vincristine was reduced due to peripheral neuropathy.   08/31/2013 Imaging Repeat PET scans show no evidence of disease.   06/10/2014 Imaging CT chest showed no evidence of recurrence    INTERVAL HISTORY: Please see below for problem oriented charting. She returns for further follow-up. She feels well. Denies recent infection. She complained of occasional sore throat but that resolved. Denies new lymphadenopathy.  REVIEW OF SYSTEMS:   Constitutional: Denies fevers, chills or abnormal weight loss Eyes: Denies blurriness of vision Ears, nose, mouth, throat, and face: Denies mucositis or sore throat Respiratory: Denies cough, dyspnea or wheezes Cardiovascular: Denies palpitation, chest discomfort or lower extremity swelling Gastrointestinal:  Denies nausea, heartburn or change in bowel habits Skin: Denies abnormal skin  rashes Lymphatics: Denies new lymphadenopathy or easy bruising Neurological:Denies numbness, tingling or new weaknesses Behavioral/Psych: Mood is stable, no new changes  All other systems were reviewed with the patient and are negative.  I have reviewed the past medical history, past surgical history, social history and family history with the patient and they are unchanged from previous note.  ALLERGIES:  is allergic to betadine and codeine.  MEDICATIONS:  Current Outpatient Prescriptions  Medication Sig Dispense Refill  . ALPRAZolam (XANAX) 0.25 MG tablet Take 1 tablet (0.25 mg total) by mouth 2 (two) times daily as needed for anxiety. 90 tablet 0  . aspirin 81 MG tablet Take 81 mg by mouth daily.    Marland Kitchen buPROPion (WELLBUTRIN XL) 150 MG 24 hr tablet Take 1 tablet (150 mg total) by mouth daily. Office visit due now 90 tablet 1  . cyanocobalamin (,VITAMIN B-12,) 1000 MCG/ML injection Inject 1,000 mcg into the muscle every 30 (thirty) days.    Marland Kitchen FLUoxetine (PROZAC) 20 MG tablet Take 2 tablets (40 mg total) by mouth daily. 180 tablet 3  . lisinopril-hydrochlorothiazide (PRINZIDE,ZESTORETIC) 20-25 MG per tablet 1 tab by mouth daily--Office visit due now 90 tablet 3  . PRILOSEC OTC 20 MG tablet TAKE 1 TABLET (20 MG TOTAL) BY MOUTH DAILY. 30 tablet 10  . valACYclovir (VALTREX) 1000 MG tablet Take 1 tablet (1,000 mg total) by mouth 3 (three) times daily. 21 tablet 0   No current facility-administered medications for this visit.    PHYSICAL EXAMINATION: ECOG PERFORMANCE STATUS: 0 - Asymptomatic  Filed Vitals:   06/10/14 0837  BP: 123/79  Pulse: 86  Temp: 98.2 F (36.8 C)  Resp: 19   Filed Weights  06/10/14 0837  Weight: 228 lb 1.6 oz (103.465 kg)    GENERAL:alert, no distress and comfortable SKIN: skin color, texture, turgor are normal, no rashes or significant lesions EYES: normal, Conjunctiva are pink and non-injected, sclera clear OROPHARYNX:no exudate, no erythema and lips,  buccal mucosa, and tongue normal  NECK: supple, thyroid normal size, non-tender, without nodularity LYMPH:  no palpable lymphadenopathy in the cervical, axillary or inguinal LUNGS: clear to auscultation and percussion with normal breathing effort HEART: regular rate & rhythm and no murmurs and no lower extremity edema ABDOMEN:abdomen soft, non-tender and normal bowel sounds Musculoskeletal:no cyanosis of digits and no clubbing  NEURO: alert & oriented x 3 with fluent speech, no focal motor/sensory deficits  LABORATORY DATA:  I have reviewed the data as listed    Component Value Date/Time   NA 140 06/07/2014 0821   NA 136 05/22/2014 0920   K 3.9 06/07/2014 0821   K 3.4* 05/22/2014 0920   CL 98 05/22/2014 0920   CO2 25 06/07/2014 0821   CO2 30 05/22/2014 0920   GLUCOSE 94 06/07/2014 0821   GLUCOSE 70 05/22/2014 0920   BUN 15.9 06/07/2014 0821   BUN 14 05/22/2014 0920   CREATININE 0.8 06/07/2014 0821   CREATININE 0.89 05/22/2014 0920   CALCIUM 9.4 06/07/2014 0821   CALCIUM 9.8 05/22/2014 0920   PROT 6.7 06/07/2014 0821   PROT 7.1 05/22/2014 0920   ALBUMIN 3.6 06/07/2014 0821   ALBUMIN 4.0 05/22/2014 0920   AST 19 06/07/2014 0821   AST 15 05/22/2014 0920   ALT 22 06/07/2014 0821   ALT 17 05/22/2014 0920   ALKPHOS 146 06/07/2014 0821   ALKPHOS 143* 05/22/2014 0920   BILITOT 0.36 06/07/2014 0821   BILITOT 0.7 05/22/2014 0920   GFRNONAA 77* 03/23/2013 0953   GFRAA 90* 03/23/2013 0953    No results found for: SPEP, UPEP  Lab Results  Component Value Date   WBC 5.5 06/07/2014   NEUTROABS 4.1 06/07/2014   HGB 12.7 06/07/2014   HCT 38.9 06/07/2014   MCV 86.7 06/07/2014   PLT 278 06/07/2014      Chemistry      Component Value Date/Time   NA 140 06/07/2014 0821   NA 136 05/22/2014 0920   K 3.9 06/07/2014 0821   K 3.4* 05/22/2014 0920   CL 98 05/22/2014 0920   CO2 25 06/07/2014 0821   CO2 30 05/22/2014 0920   BUN 15.9 06/07/2014 0821   BUN 14 05/22/2014 0920    CREATININE 0.8 06/07/2014 0821   CREATININE 0.89 05/22/2014 0920      Component Value Date/Time   CALCIUM 9.4 06/07/2014 0821   CALCIUM 9.8 05/22/2014 0920   ALKPHOS 146 06/07/2014 0821   ALKPHOS 143* 05/22/2014 0920   AST 19 06/07/2014 0821   AST 15 05/22/2014 0920   ALT 22 06/07/2014 0821   ALT 17 05/22/2014 0920   BILITOT 0.36 06/07/2014 0821   BILITOT 0.7 05/22/2014 0920      ASSESSMENT & PLAN:  Lymphoma, T-cell Patient is status post CHOP/etoposide chemotherapy completed June 2015. Most recent restaging scans showed no recurrence. She will continue under observation.  I reinforced the importance of preventative mammogram and Pap smear.  I plan to see her back in 4 months to repeat history, physical examination and blood work.    Hyperlipidemia She has multiple risk factors for cardiac disease. Her most recent LDL is mildly elevated. Recommend increase exercise, dietary modification and weight loss. I felt that that  could be rechecked at the end of the year to reassess. Currently, she is on aspirin.    No orders of the defined types were placed in this encounter.   All questions were answered. The patient knows to call the clinic with any problems, questions or concerns. No barriers to learning was detected. I spent 15 minutes counseling the patient face to face. The total time spent in the appointment was 20 minutes and more than 50% was on counseling and review of test results     [facility], Amanda Park, MD 06/10/2014 10:00 AM

## 2014-06-10 NOTE — Assessment & Plan Note (Signed)
Patient is status post CHOP/etoposide chemotherapy completed June 2015. Most recent restaging scans showed no recurrence. She will continue under observation.  I reinforced the importance of preventative mammogram and Pap smear.  I plan to see her back in 4 months to repeat history, physical examination and blood work.

## 2014-06-14 ENCOUNTER — Ambulatory Visit: Payer: PRIVATE HEALTH INSURANCE

## 2014-06-20 ENCOUNTER — Ambulatory Visit (INDEPENDENT_AMBULATORY_CARE_PROVIDER_SITE_OTHER): Payer: PRIVATE HEALTH INSURANCE | Admitting: *Deleted

## 2014-06-20 DIAGNOSIS — E538 Deficiency of other specified B group vitamins: Secondary | ICD-10-CM | POA: Diagnosis not present

## 2014-06-20 MED ORDER — CYANOCOBALAMIN 1000 MCG/ML IJ SOLN
1000.0000 ug | Freq: Once | INTRAMUSCULAR | Status: AC
  Administered 2014-06-20: 1000 ug via INTRAMUSCULAR

## 2014-06-20 NOTE — Progress Notes (Signed)
Pre visit review using our clinic review tool, if applicable. No additional management support is needed unless otherwise documented below in the visit note.  Patient tolerated injection well.  Next injection due during physical 07/12/14.

## 2014-06-21 ENCOUNTER — Telehealth: Payer: Self-pay | Admitting: Family Medicine

## 2014-06-21 NOTE — Telephone Encounter (Signed)
Pre Visit letter sent  °

## 2014-07-04 ENCOUNTER — Ambulatory Visit
Admission: RE | Admit: 2014-07-04 | Discharge: 2014-07-04 | Disposition: A | Discharge status: Home / Self Care | Payer: PRIVATE HEALTH INSURANCE | Source: Ambulatory Visit | Attending: Family Medicine | Admitting: Family Medicine

## 2014-07-04 DIAGNOSIS — Z1231 Encounter for screening mammogram for malignant neoplasm of breast: Secondary | ICD-10-CM

## 2014-07-11 ENCOUNTER — Encounter: Payer: BC Managed Care – PPO | Admitting: Family Medicine

## 2014-07-12 ENCOUNTER — Encounter: Payer: BLUE CROSS/BLUE SHIELD | Admitting: Family Medicine

## 2014-08-09 ENCOUNTER — Other Ambulatory Visit: Payer: Self-pay

## 2014-08-09 MED ORDER — BUPROPION HCL ER (XL) 150 MG PO TB24: 150.0000 mg | ORAL_TABLET | Freq: Every day | ORAL | Status: DC

## 2014-08-23 ENCOUNTER — Telehealth: Payer: Self-pay | Admitting: Behavioral Health

## 2014-08-23 NOTE — Telephone Encounter (Signed)
Unable to reach patient at time of Pre-Visit Call.  Left message for patient to return call when available.    

## 2014-08-26 ENCOUNTER — Encounter: Payer: Self-pay | Admitting: Family Medicine

## 2014-08-26 ENCOUNTER — Ambulatory Visit (INDEPENDENT_AMBULATORY_CARE_PROVIDER_SITE_OTHER): Payer: PRIVATE HEALTH INSURANCE | Admitting: Family Medicine

## 2014-08-26 VITALS — BP 118/70 | HR 68 | Temp 98.0°F | Resp 18 | Ht 66.0 in | Wt 226.0 lb

## 2014-08-26 DIAGNOSIS — E538 Deficiency of other specified B group vitamins: Secondary | ICD-10-CM | POA: Diagnosis not present

## 2014-08-26 DIAGNOSIS — I10 Essential (primary) hypertension: Secondary | ICD-10-CM

## 2014-08-26 DIAGNOSIS — Z0000 Encounter for general adult medical examination without abnormal findings: Secondary | ICD-10-CM

## 2014-08-26 DIAGNOSIS — K219 Gastro-esophageal reflux disease without esophagitis: Secondary | ICD-10-CM

## 2014-08-26 MED ORDER — OMEPRAZOLE MAGNESIUM 20 MG PO TBEC: 20.0000 mg | DELAYED_RELEASE_TABLET | Freq: Every day | ORAL | Status: DC | PRN

## 2014-08-26 MED ORDER — CYANOCOBALAMIN 1000 MCG/ML IJ SOLN
1000.0000 ug | Freq: Once | INTRAMUSCULAR | Status: AC
  Administered 2014-08-26: 1000 ug via INTRAMUSCULAR

## 2014-08-26 MED ORDER — LISINOPRIL-HYDROCHLOROTHIAZIDE 20-25 MG PO TABS: ORAL_TABLET | ORAL | Status: DC

## 2014-08-26 NOTE — Progress Notes (Signed)
Pre visit review using our clinic review tool, if applicable. No additional management support is needed unless otherwise documented below in the visit note. 

## 2014-08-26 NOTE — Patient Instructions (Signed)
Preventive Care for Adults A healthy lifestyle and preventive care can promote health and wellness. Preventive health guidelines for women include the following key practices.  A routine yearly physical is a good way to check with your health care provider about your health and preventive screening. It is a chance to share any concerns and updates on your health and to receive a thorough exam.  Visit your dentist for a routine exam and preventive care every 6 months. Brush your teeth twice a day and floss once a day. Good oral hygiene prevents tooth decay and gum disease.  The frequency of eye exams is based on your age, health, family medical history, use of contact lenses, and other factors. Follow your health care provider's recommendations for frequency of eye exams.  Eat a healthy diet. Foods like vegetables, fruits, whole grains, low-fat dairy products, and lean protein foods contain the nutrients you need without too many calories. Decrease your intake of foods high in solid fats, added sugars, and salt. Eat the right amount of calories for you.Get information about a proper diet from your health care provider, if necessary.  Regular physical exercise is one of the most important things you can do for your health. Most adults should get at least 150 minutes of moderate-intensity exercise (any activity that increases your heart rate and causes you to sweat) each week. In addition, most adults need muscle-strengthening exercises on 2 or more days a week.  Maintain a healthy weight. The body mass index (BMI) is a screening tool to identify possible weight problems. It provides an estimate of body fat based on height and weight. Your health care provider can find your BMI and can help you achieve or maintain a healthy weight.For adults 20 years and older:  A BMI below 18.5 is considered underweight.  A BMI of 18.5 to 24.9 is normal.  A BMI of 25 to 29.9 is considered overweight.  A BMI of  30 and above is considered obese.  Maintain normal blood lipids and cholesterol levels by exercising and minimizing your intake of saturated fat. Eat a balanced diet with plenty of fruit and vegetables. Blood tests for lipids and cholesterol should begin at age 76 and be repeated every 5 years. If your lipid or cholesterol levels are high, you are over 50, or you are at high risk for heart disease, you may need your cholesterol levels checked more frequently.Ongoing high lipid and cholesterol levels should be treated with medicines if diet and exercise are not working.  If you smoke, find out from your health care provider how to quit. If you do not use tobacco, do not start.  Lung cancer screening is recommended for adults aged 22-80 years who are at high risk for developing lung cancer because of a history of smoking. A yearly low-dose CT scan of the lungs is recommended for people who have at least a 30-pack-year history of smoking and are a current smoker or have quit within the past 15 years. A pack year of smoking is smoking an average of 1 pack of cigarettes a day for 1 year (for example: 1 pack a day for 30 years or 2 packs a day for 15 years). Yearly screening should continue until the smoker has stopped smoking for at least 15 years. Yearly screening should be stopped for people who develop a health problem that would prevent them from having lung cancer treatment.  If you are pregnant, do not drink alcohol. If you are breastfeeding,  be very cautious about drinking alcohol. If you are not pregnant and choose to drink alcohol, do not have more than 1 drink per day. One drink is considered to be 12 ounces (355 mL) of beer, 5 ounces (148 mL) of wine, or 1.5 ounces (44 mL) of liquor.  Avoid use of street drugs. Do not share needles with anyone. Ask for help if you need support or instructions about stopping the use of drugs.  High blood pressure causes heart disease and increases the risk of  stroke. Your blood pressure should be checked at least every 1 to 2 years. Ongoing high blood pressure should be treated with medicines if weight loss and exercise do not work.  If you are 3-86 years old, ask your health care provider if you should take aspirin to prevent strokes.  Diabetes screening involves taking a blood sample to check your fasting blood sugar level. This should be done once every 3 years, after age 67, if you are within normal weight and without risk factors for diabetes. Testing should be considered at a younger age or be carried out more frequently if you are overweight and have at least 1 risk factor for diabetes.  Breast cancer screening is essential preventive care for women. You should practice "breast self-awareness." This means understanding the normal appearance and feel of your breasts and may include breast self-examination. Any changes detected, no matter how small, should be reported to a health care provider. Women in their 8s and 30s should have a clinical breast exam (CBE) by a health care provider as part of a regular health exam every 1 to 3 years. After age 70, women should have a CBE every year. Starting at age 25, women should consider having a mammogram (breast X-ray test) every year. Women who have a family history of breast cancer should talk to their health care provider about genetic screening. Women at a high risk of breast cancer should talk to their health care providers about having an MRI and a mammogram every year.  Breast cancer gene (BRCA)-related cancer risk assessment is recommended for women who have family members with BRCA-related cancers. BRCA-related cancers include breast, ovarian, tubal, and peritoneal cancers. Having family members with these cancers may be associated with an increased risk for harmful changes (mutations) in the breast cancer genes BRCA1 and BRCA2. Results of the assessment will determine the need for genetic counseling and  BRCA1 and BRCA2 testing.  Routine pelvic exams to screen for cancer are no longer recommended for nonpregnant women who are considered low risk for cancer of the pelvic organs (ovaries, uterus, and vagina) and who do not have symptoms. Ask your health care provider if a screening pelvic exam is right for you.  If you have had past treatment for cervical cancer or a condition that could lead to cancer, you need Pap tests and screening for cancer for at least 20 years after your treatment. If Pap tests have been discontinued, your risk factors (such as having a new sexual partner) need to be reassessed to determine if screening should be resumed. Some women have medical problems that increase the chance of getting cervical cancer. In these cases, your health care provider may recommend more frequent screening and Pap tests.  The HPV test is an additional test that may be used for cervical cancer screening. The HPV test looks for the virus that can cause the cell changes on the cervix. The cells collected during the Pap test can be  tested for HPV. The HPV test could be used to screen women aged 30 years and older, and should be used in women of any age who have unclear Pap test results. After the age of 30, women should have HPV testing at the same frequency as a Pap test.  Colorectal cancer can be detected and often prevented. Most routine colorectal cancer screening begins at the age of 50 years and continues through age 75 years. However, your health care provider may recommend screening at an earlier age if you have risk factors for colon cancer. On a yearly basis, your health care provider may provide home test kits to check for hidden blood in the stool. Use of a small camera at the end of a tube, to directly examine the colon (sigmoidoscopy or colonoscopy), can detect the earliest forms of colorectal cancer. Talk to your health care provider about this at age 50, when routine screening begins. Direct  exam of the colon should be repeated every 5-10 years through age 75 years, unless early forms of pre-cancerous polyps or small growths are found.  People who are at an increased risk for hepatitis B should be screened for this virus. You are considered at high risk for hepatitis B if:  You were born in a country where hepatitis B occurs often. Talk with your health care provider about which countries are considered high risk.  Your parents were born in a high-risk country and you have not received a shot to protect against hepatitis B (hepatitis B vaccine).  You have HIV or AIDS.  You use needles to inject street drugs.  You live with, or have sex with, someone who has hepatitis B.  You get hemodialysis treatment.  You take certain medicines for conditions like cancer, organ transplantation, and autoimmune conditions.  Hepatitis C blood testing is recommended for all people born from 1945 through 1965 and any individual with known risks for hepatitis C.  Practice safe sex. Use condoms and avoid high-risk sexual practices to reduce the spread of sexually transmitted infections (STIs). STIs include gonorrhea, chlamydia, syphilis, trichomonas, herpes, HPV, and human immunodeficiency virus (HIV). Herpes, HIV, and HPV are viral illnesses that have no cure. They can result in disability, cancer, and death.  You should be screened for sexually transmitted illnesses (STIs) including gonorrhea and chlamydia if:  You are sexually active and are younger than 24 years.  You are older than 24 years and your health care provider tells you that you are at risk for this type of infection.  Your sexual activity has changed since you were last screened and you are at an increased risk for chlamydia or gonorrhea. Ask your health care provider if you are at risk.  If you are at risk of being infected with HIV, it is recommended that you take a prescription medicine daily to prevent HIV infection. This is  called preexposure prophylaxis (PrEP). You are considered at risk if:  You are a heterosexual woman, are sexually active, and are at increased risk for HIV infection.  You take drugs by injection.  You are sexually active with a partner who has HIV.  Talk with your health care provider about whether you are at high risk of being infected with HIV. If you choose to begin PrEP, you should first be tested for HIV. You should then be tested every 3 months for as long as you are taking PrEP.  Osteoporosis is a disease in which the bones lose minerals and strength   with aging. This can result in serious bone fractures or breaks. The risk of osteoporosis can be identified using a bone density scan. Women ages 65 years and over and women at risk for fractures or osteoporosis should discuss screening with their health care providers. Ask your health care provider whether you should take a calcium supplement or vitamin D to reduce the rate of osteoporosis.  Menopause can be associated with physical symptoms and risks. Hormone replacement therapy is available to decrease symptoms and risks. You should talk to your health care provider about whether hormone replacement therapy is right for you.  Use sunscreen. Apply sunscreen liberally and repeatedly throughout the day. You should seek shade when your shadow is shorter than you. Protect yourself by wearing long sleeves, pants, a wide-brimmed hat, and sunglasses year round, whenever you are outdoors.  Once a month, do a whole body skin exam, using a mirror to look at the skin on your back. Tell your health care provider of new moles, moles that have irregular borders, moles that are larger than a pencil eraser, or moles that have changed in shape or color.  Stay current with required vaccines (immunizations).  Influenza vaccine. All adults should be immunized every year.  Tetanus, diphtheria, and acellular pertussis (Td, Tdap) vaccine. Pregnant women should  receive 1 dose of Tdap vaccine during each pregnancy. The dose should be obtained regardless of the length of time since the last dose. Immunization is preferred during the 27th-36th week of gestation. An adult who has not previously received Tdap or who does not know her vaccine status should receive 1 dose of Tdap. This initial dose should be followed by tetanus and diphtheria toxoids (Td) booster doses every 10 years. Adults with an unknown or incomplete history of completing a 3-dose immunization series with Td-containing vaccines should begin or complete a primary immunization series including a Tdap dose. Adults should receive a Td booster every 10 years.  Varicella vaccine. An adult without evidence of immunity to varicella should receive 2 doses or a second dose if she has previously received 1 dose. Pregnant females who do not have evidence of immunity should receive the first dose after pregnancy. This first dose should be obtained before leaving the health care facility. The second dose should be obtained 4-8 weeks after the first dose.  Human papillomavirus (HPV) vaccine. Females aged 13-26 years who have not received the vaccine previously should obtain the 3-dose series. The vaccine is not recommended for use in pregnant females. However, pregnancy testing is not needed before receiving a dose. If a female is found to be pregnant after receiving a dose, no treatment is needed. In that case, the remaining doses should be delayed until after the pregnancy. Immunization is recommended for any person with an immunocompromised condition through the age of 26 years if she did not get any or all doses earlier. During the 3-dose series, the second dose should be obtained 4-8 weeks after the first dose. The third dose should be obtained 24 weeks after the first dose and 16 weeks after the second dose.  Zoster vaccine. One dose is recommended for adults aged 60 years or older unless certain conditions are  present.  Measles, mumps, and rubella (MMR) vaccine. Adults born before 1957 generally are considered immune to measles and mumps. Adults born in 1957 or later should have 1 or more doses of MMR vaccine unless there is a contraindication to the vaccine or there is laboratory evidence of immunity to   each of the three diseases. A routine second dose of MMR vaccine should be obtained at least 28 days after the first dose for students attending postsecondary schools, health care workers, or international travelers. People who received inactivated measles vaccine or an unknown type of measles vaccine during 1963-1967 should receive 2 doses of MMR vaccine. People who received inactivated mumps vaccine or an unknown type of mumps vaccine before 1979 and are at high risk for mumps infection should consider immunization with 2 doses of MMR vaccine. For females of childbearing age, rubella immunity should be determined. If there is no evidence of immunity, females who are not pregnant should be vaccinated. If there is no evidence of immunity, females who are pregnant should delay immunization until after pregnancy. Unvaccinated health care workers born before 1957 who lack laboratory evidence of measles, mumps, or rubella immunity or laboratory confirmation of disease should consider measles and mumps immunization with 2 doses of MMR vaccine or rubella immunization with 1 dose of MMR vaccine.  Pneumococcal 13-valent conjugate (PCV13) vaccine. When indicated, a person who is uncertain of her immunization history and has no record of immunization should receive the PCV13 vaccine. An adult aged 19 years or older who has certain medical conditions and has not been previously immunized should receive 1 dose of PCV13 vaccine. This PCV13 should be followed with a dose of pneumococcal polysaccharide (PPSV23) vaccine. The PPSV23 vaccine dose should be obtained at least 8 weeks after the dose of PCV13 vaccine. An adult aged 19  years or older who has certain medical conditions and previously received 1 or more doses of PPSV23 vaccine should receive 1 dose of PCV13. The PCV13 vaccine dose should be obtained 1 or more years after the last PPSV23 vaccine dose.  Pneumococcal polysaccharide (PPSV23) vaccine. When PCV13 is also indicated, PCV13 should be obtained first. All adults aged 65 years and older should be immunized. An adult younger than age 65 years who has certain medical conditions should be immunized. Any person who resides in a nursing home or long-term care facility should be immunized. An adult smoker should be immunized. People with an immunocompromised condition and certain other conditions should receive both PCV13 and PPSV23 vaccines. People with human immunodeficiency virus (HIV) infection should be immunized as soon as possible after diagnosis. Immunization during chemotherapy or radiation therapy should be avoided. Routine use of PPSV23 vaccine is not recommended for American Indians, [place] Natives, or people younger than 65 years unless there are medical conditions that require PPSV23 vaccine. When indicated, people who have unknown immunization and have no record of immunization should receive PPSV23 vaccine. One-time revaccination 5 years after the first dose of PPSV23 is recommended for people aged 19-64 years who have chronic kidney failure, nephrotic syndrome, asplenia, or immunocompromised conditions. People who received 1-2 doses of PPSV23 before age 65 years should receive another dose of PPSV23 vaccine at age 65 years or later if at least 5 years have passed since the previous dose. Doses of PPSV23 are not needed for people immunized with PPSV23 at or after age 65 years.  Meningococcal vaccine. Adults with asplenia or persistent complement component deficiencies should receive 2 doses of quadrivalent meningococcal conjugate (MenACWY-D) vaccine. The doses should be obtained at least 2 months apart.  Microbiologists working with certain meningococcal bacteria, military recruits, people at risk during an outbreak, and people who travel to or live in countries with a high rate of meningitis should be immunized. A first-year college student up through age   21 years who is living in a residence hall should receive a dose if she did not receive a dose on or after her 16th birthday. Adults who have certain high-risk conditions should receive one or more doses of vaccine.  Hepatitis A vaccine. Adults who wish to be protected from this disease, have certain high-risk conditions, work with hepatitis A-infected animals, work in hepatitis A research labs, or travel to or work in countries with a high rate of hepatitis A should be immunized. Adults who were previously unvaccinated and who anticipate close contact with an international adoptee during the first 60 days after arrival in the [place] from a country with a high rate of hepatitis A should be immunized.  Hepatitis B vaccine. Adults who wish to be protected from this disease, have certain high-risk conditions, may be exposed to blood or other infectious body fluids, are household contacts or sex partners of hepatitis B positive people, are clients or workers in certain care facilities, or travel to or work in countries with a high rate of hepatitis B should be immunized.  Haemophilus influenzae type b (Hib) vaccine. A previously unvaccinated person with asplenia or sickle cell disease or having a scheduled splenectomy should receive 1 dose of Hib vaccine. Regardless of previous immunization, a recipient of a hematopoietic stem cell transplant should receive a 3-dose series 6-12 months after her successful transplant. Hib vaccine is not recommended for adults with HIV infection. Preventive Services / Frequency Ages 64 to 68 years  Blood pressure check.** / Every 1 to 2 years.  Lipid and cholesterol check.** / Every 5 years beginning at age  22.  Clinical breast exam.** / Every 3 years for women in their 88s and 53s.  BRCA-related cancer risk assessment.** / For women who have family members with a BRCA-related cancer (breast, ovarian, tubal, or peritoneal cancers).  Pap test.** / Every 2 years from ages 90 through 51. Every 3 years starting at age 21 through age 56 or 3 with a history of 3 consecutive normal Pap tests.  HPV screening.** / Every 3 years from ages 24 through ages 1 to 46 with a history of 3 consecutive normal Pap tests.  Hepatitis C blood test.** / For any individual with known risks for hepatitis C.  Skin self-exam. / Monthly.  Influenza vaccine. / Every year.  Tetanus, diphtheria, and acellular pertussis (Tdap, Td) vaccine.** / Consult your health care provider. Pregnant women should receive 1 dose of Tdap vaccine during each pregnancy. 1 dose of Td every 10 years.  Varicella vaccine.** / Consult your health care provider. Pregnant females who do not have evidence of immunity should receive the first dose after pregnancy.  HPV vaccine. / 3 doses over 6 months, if 72 and younger. The vaccine is not recommended for use in pregnant females. However, pregnancy testing is not needed before receiving a dose.  Measles, mumps, rubella (MMR) vaccine.** / You need at least 1 dose of MMR if you were born in 1957 or later. You may also need a 2nd dose. For females of childbearing age, rubella immunity should be determined. If there is no evidence of immunity, females who are not pregnant should be vaccinated. If there is no evidence of immunity, females who are pregnant should delay immunization until after pregnancy.  Pneumococcal 13-valent conjugate (PCV13) vaccine.** / Consult your health care provider.  Pneumococcal polysaccharide (PPSV23) vaccine.** / 1 to 2 doses if you smoke cigarettes or if you have certain conditions.  Meningococcal vaccine.** /  1 dose if you are age 19 to 21 years and a first-year college  student living in a residence hall, or have one of several medical conditions, you need to get vaccinated against meningococcal disease. You may also need additional booster doses.  Hepatitis A vaccine.** / Consult your health care provider.  Hepatitis B vaccine.** / Consult your health care provider.  Haemophilus influenzae type b (Hib) vaccine.** / Consult your health care provider. Ages 40 to 64 years  Blood pressure check.** / Every 1 to 2 years.  Lipid and cholesterol check.** / Every 5 years beginning at age 20 years.  Lung cancer screening. / Every year if you are aged 55-80 years and have a 30-pack-year history of smoking and currently smoke or have quit within the past 15 years. Yearly screening is stopped once you have quit smoking for at least 15 years or develop a health problem that would prevent you from having lung cancer treatment.  Clinical breast exam.** / Every year after age 40 years.  BRCA-related cancer risk assessment.** / For women who have family members with a BRCA-related cancer (breast, ovarian, tubal, or peritoneal cancers).  Mammogram.** / Every year beginning at age 40 years and continuing for as long as you are in good health. Consult with your health care provider.  Pap test.** / Every 3 years starting at age 30 years through age 65 or 70 years with a history of 3 consecutive normal Pap tests.  HPV screening.** / Every 3 years from ages 30 years through ages 65 to 70 years with a history of 3 consecutive normal Pap tests.  Fecal occult blood test (FOBT) of stool. / Every year beginning at age 50 years and continuing until age 75 years. You may not need to do this test if you get a colonoscopy every 10 years.  Flexible sigmoidoscopy or colonoscopy.** / Every 5 years for a flexible sigmoidoscopy or every 10 years for a colonoscopy beginning at age 50 years and continuing until age 75 years.  Hepatitis C blood test.** / For all people born from 1945 through  1965 and any individual with known risks for hepatitis C.  Skin self-exam. / Monthly.  Influenza vaccine. / Every year.  Tetanus, diphtheria, and acellular pertussis (Tdap/Td) vaccine.** / Consult your health care provider. Pregnant women should receive 1 dose of Tdap vaccine during each pregnancy. 1 dose of Td every 10 years.  Varicella vaccine.** / Consult your health care provider. Pregnant females who do not have evidence of immunity should receive the first dose after pregnancy.  Zoster vaccine.** / 1 dose for adults aged 60 years or older.  Measles, mumps, rubella (MMR) vaccine.** / You need at least 1 dose of MMR if you were born in 1957 or later. You may also need a 2nd dose. For females of childbearing age, rubella immunity should be determined. If there is no evidence of immunity, females who are not pregnant should be vaccinated. If there is no evidence of immunity, females who are pregnant should delay immunization until after pregnancy.  Pneumococcal 13-valent conjugate (PCV13) vaccine.** / Consult your health care provider.  Pneumococcal polysaccharide (PPSV23) vaccine.** / 1 to 2 doses if you smoke cigarettes or if you have certain conditions.  Meningococcal vaccine.** / Consult your health care provider.  Hepatitis A vaccine.** / Consult your health care provider.  Hepatitis B vaccine.** / Consult your health care provider.  Haemophilus influenzae type b (Hib) vaccine.** / Consult your health care provider. Ages 65   years and over  Blood pressure check.** / Every 1 to 2 years.  Lipid and cholesterol check.** / Every 5 years beginning at age 22 years.  Lung cancer screening. / Every year if you are aged 73-80 years and have a 30-pack-year history of smoking and currently smoke or have quit within the past 15 years. Yearly screening is stopped once you have quit smoking for at least 15 years or develop a health problem that would prevent you from having lung cancer  treatment.  Clinical breast exam.** / Every year after age 4 years.  BRCA-related cancer risk assessment.** / For women who have family members with a BRCA-related cancer (breast, ovarian, tubal, or peritoneal cancers).  Mammogram.** / Every year beginning at age 40 years and continuing for as long as you are in good health. Consult with your health care provider.  Pap test.** / Every 3 years starting at age 9 years through age 34 or 91 years with 3 consecutive normal Pap tests. Testing can be stopped between 65 and 70 years with 3 consecutive normal Pap tests and no abnormal Pap or HPV tests in the past 10 years.  HPV screening.** / Every 3 years from ages 57 years through ages 64 or 45 years with a history of 3 consecutive normal Pap tests. Testing can be stopped between 65 and 70 years with 3 consecutive normal Pap tests and no abnormal Pap or HPV tests in the past 10 years.  Fecal occult blood test (FOBT) of stool. / Every year beginning at age 15 years and continuing until age 17 years. You may not need to do this test if you get a colonoscopy every 10 years.  Flexible sigmoidoscopy or colonoscopy.** / Every 5 years for a flexible sigmoidoscopy or every 10 years for a colonoscopy beginning at age 86 years and continuing until age 71 years.  Hepatitis C blood test.** / For all people born from [birth year] through 1963 and any individual with known risks for hepatitis C.  Osteoporosis screening.** / A one-time screening for women ages 83 years and over and women at risk for fractures or osteoporosis.  Skin self-exam. / Monthly.  Influenza vaccine. / Every year.  Tetanus, diphtheria, and acellular pertussis (Tdap/Td) vaccine.** / 1 dose of Td every 10 years.  Varicella vaccine.** / Consult your health care provider.  Zoster vaccine.** / 1 dose for adults aged 61 years or older.  Pneumococcal 13-valent conjugate (PCV13) vaccine.** / Consult your health care provider.  Pneumococcal  polysaccharide (PPSV23) vaccine.** / 1 dose for all adults aged 28 years and older.  Meningococcal vaccine.** / Consult your health care provider.  Hepatitis A vaccine.** / Consult your health care provider.  Hepatitis B vaccine.** / Consult your health care provider.  Haemophilus influenzae type b (Hib) vaccine.** / Consult your health care provider. ** Family history and personal history of risk and conditions may change your health care provider's recommendations. Document Released: 03/30/2001 Document Revised: 06/18/2013 Document Reviewed: 06/29/2010 [facility] Patient Information 2015 [place], [place]. This information is not intended to replace advice given to you by your health care provider. Make sure you discuss any questions you have with your health care provider.

## 2014-08-26 NOTE — Progress Notes (Signed)
Subjective:     Anna Jackson is a 54 y.o. female and is here for a comprehensive physical exam. The patient reports no problems.  History   Social History  . Marital Status: Single    Spouse Name: N/A  . Number of Children: 1  . Years of Education: N/A   Occupational History  . RECEIVING CLERK    Social History Main Topics  . Smoking status: Never Smoker   . Smokeless tobacco: Never Used  . Alcohol Use: Yes     Comment: occasional  . Drug Use: No  . Sexual Activity:    Partners: Male   Other Topics Concern  . Not on file   Social History Narrative   Exercise--  treadmill   Health Maintenance  Topic Date Due  . HIV Screening  08/26/2015 (Originally 11/14/1975)  . INFLUENZA VACCINE  09/16/2014  . PAP SMEAR  03/28/2015  . MAMMOGRAM  07/04/2015  . COLONOSCOPY  10/01/2020  . 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 She  does not have any pertinent problems on file. She  has past surgical history that includes Cholecystectomy; Abdominal hysterectomy (2006); Nissen fundoplication; Cervical conization w/bx; Tubal ligation; Knee surgery (02/2010); Axillary lymph node dissection (Left, 03/09/2013); and Portacath placement (N/A, 03/23/2013). Her family history includes Breast cancer in her maternal grandmother; Cancer (age of onset: 64) in her maternal grandmother; Coronary artery disease in an other family member; Heart disease in  her mother; Hypertension in an other family member; Migraines in an other family member. There is no history of Colon cancer. She  reports that she has never smoked. She has never used smokeless tobacco. She reports that she drinks alcohol. She reports that she does not use illicit drugs. She has a current medication list which includes the following prescription(s): alprazolam, aspirin, bupropion, cyanocobalamin, fluoxetine, lisinopril-hydrochlorothiazide, omeprazole, and valacyclovir. Current Outpatient Prescriptions on File Prior to Visit  Medication Sig Dispense Refill  . ALPRAZolam (XANAX) 0.25 MG tablet Take 1 tablet (0.25 mg total) by mouth 2 (two) times daily as needed for anxiety. 90 tablet 0  . aspirin 81 MG tablet Take 81 mg by mouth every other day.     Marland Kitchen buPROPion (WELLBUTRIN XL) 150 MG 24 hr tablet Take 1 tablet (150 mg total) by mouth daily. 90 tablet 1  . cyanocobalamin (,VITAMIN B-12,) 1000 MCG/ML injection Inject 1,000 mcg into the muscle every 30 (thirty) days.    Marland Kitchen FLUoxetine (PROZAC) 20 MG tablet Take 2 tablets (40 mg total) by mouth daily. 180 tablet 3  . valACYclovir (VALTREX) 1000 MG tablet Take 1 tablet (1,000 mg total) by mouth 3 (three) times daily. 21 tablet 0   No current facility-administered medications on file prior to visit.   She is allergic to betadine and codeine..  Review of Systems Review of Systems  Constitutional: Negative for activity change, appetite change and fatigue.  HENT: Negative for hearing loss, congestion, tinnitus and ear discharge.  dentist q88m Eyes: Negative for visual disturbance (see optho q1y -- vision corrected to 20/20 with glasses).  Respiratory: Negative for cough, chest tightness and shortness of  breath.   Cardiovascular: Negative for chest pain, palpitations and leg swelling.  Gastrointestinal: Negative for abdominal pain, diarrhea, constipation and abdominal distention.  Genitourinary: Negative for urgency, frequency, decreased  urine volume and difficulty urinating.  Musculoskeletal: Negative for back pain, arthralgias and gait problem.  Skin: Negative for color change, pallor and rash.  Neurological: Negative for dizziness, light-headedness, numbness and headaches.  Hematological: Negative for adenopathy. Does not bruise/bleed easily.  Psychiatric/Behavioral: Negative for suicidal ideas, confusion, sleep disturbance, self-injury, dysphoric mood, decreased concentration and agitation.       Objective:    BP 118/70 mmHg  Pulse 68  Temp(Src) 98 F (36.7 C) (Oral)  Resp 18  Ht 5\' 6"  (1.676 m)  Wt 226 lb (102.513 kg)  BMI 36.49 kg/m2  SpO2 99% General appearance: alert, cooperative, appears stated age and no distress Head: Normocephalic, without obvious abnormality, atraumatic Eyes: conjunctivae/corneas clear. PERRL, EOM's intact. Fundi benign. Ears: normal TM's and external ear canals both ears Nose: Nares normal. Septum midline. Mucosa normal. No drainage or sinus tenderness. Throat: lips, mucosa, and tongue normal; teeth and gums normal Neck: no adenopathy, no carotid bruit, no JVD, supple, symmetrical, trachea midline and thyroid not enlarged, symmetric, no tenderness/mass/nodules Back: symmetric, no curvature. ROM normal. No CVA tenderness. Lungs: clear to auscultation bilaterally Breasts: normal appearance, no masses or tenderness Heart: regular rate and rhythm, S1, S2 normal, no murmur, click, rub or gallop Abdomen: soft, non-tender; bowel sounds normal; no masses,  no organomegaly Pelvic: deferred Extremities: extremities normal, atraumatic, no cyanosis or edema Pulses: 2+ and symmetric Skin: Skin color, texture, turgor normal. No rashes or lesions Lymph nodes: Cervical, supraclavicular, and axillary nodes normal. Neurologic: Alert and oriented X 3, normal strength and tone. Normal symmetric reflexes. Normal coordination and gait Psych- no depression      Assessment:    Healthy female exam. \       Plan:    ghm utd Check labs See After Visit Summary for Counseling Recommendations    1. Essential hypertension stable - lisinopril-hydrochlorothiazide (PRINZIDE,ZESTORETIC) 20-25 MG per tablet; 1 tab by mouth daily--Office visit due now  Dispense: 90 tablet; Refill: 3 - Basic metabolic panel - CBC with Differential/Platelet - Hepatic function panel  2. Gastroesophageal reflux disease, esophagitis presence not specified = - omeprazole (PRILOSEC OTC) 20 MG tablet; Take 1 tablet (20 mg total) by mouth daily as needed.  Dispense: 30 tablet; Refill: 10  3. Preventative health care Check labs See AVS - Basic metabolic panel - CBC with Differential/Platelet - Hepatic function panel - Lipid panel - POCT urinalysis dipstick - TSH - Vitamin B12  4. B12 deficiency  - Vitamin B12 - cyanocobalamin ((VITAMIN B-12)) injection 1,000 mcg; Inject 1 mL (1,000 mcg total) into the muscle once.

## 2014-08-27 LAB — LIPID PANEL
Cholesterol: 187 mg/dL (ref 0–200)
HDL: 81.9 mg/dL (ref >39.00)
LDL Cholesterol: 93 mg/dL (ref 0–99)
NonHDL: 105.1
Total CHOL/HDL Ratio: 2
Triglycerides: 59 mg/dL (ref 0.0–149.0)
VLDL: 11.8 mg/dL (ref 0.0–40.0)

## 2014-08-27 LAB — CBC WITH DIFFERENTIAL/PLATELET
Basophils Absolute: 0 10*3/uL (ref 0.0–0.1)
Basophils Relative: 0.7 % (ref 0.0–3.0)
Eosinophils Absolute: 0.1 10*3/uL (ref 0.0–0.7)
Eosinophils Relative: 1.3 % (ref 0.0–5.0)
HEMATOCRIT: 37.1 % (ref 36.0–46.0)
Hemoglobin: 12.5 g/dL (ref 12.0–15.0)
LYMPHS PCT: 26.7 % (ref 12.0–46.0)
Lymphs Abs: 1.5 10*3/uL (ref 0.7–4.0)
MCHC: 33.7 g/dL (ref 30.0–36.0)
MCV: 86.6 fl (ref 78.0–100.0)
Monocytes Absolute: 0.5 10*3/uL (ref 0.1–1.0)
Monocytes Relative: 8.5 % (ref 3.0–12.0)
Neutro Abs: 3.6 10*3/uL (ref 1.4–7.7)
Neutrophils Relative %: 62.8 % (ref 43.0–77.0)
Platelets: 264 10*3/uL (ref 150.0–400.0)
RBC: 4.29 Mil/uL (ref 3.87–5.11)
RDW: 14.1 % (ref 11.5–15.5)
WBC: 5.8 10*3/uL (ref 4.0–10.5)

## 2014-08-27 LAB — BASIC METABOLIC PANEL
BUN: 16 mg/dL (ref 6–23)
CALCIUM: 9.6 mg/dL (ref 8.4–10.5)
CO2: 29 mEq/L (ref 19–32)
Chloride: 98 mEq/L (ref 96–112)
Creatinine, Ser: 0.83 mg/dL (ref 0.40–1.20)
GFR: 76.79 mL/min (ref >60.00)
Glucose, Bld: 75 mg/dL (ref 70–99)
Potassium: 3 mEq/L — ABNORMAL LOW (ref 3.5–5.1)
Sodium: 137 mEq/L (ref 135–145)

## 2014-08-27 LAB — HEPATIC FUNCTION PANEL
ALBUMIN: 4 g/dL (ref 3.5–5.2)
ALT: 26 U/L (ref 0–35)
AST: 20 U/L (ref 0–37)
Alkaline Phosphatase: 128 U/L — ABNORMAL HIGH (ref 39–117)
BILIRUBIN DIRECT: 0.1 mg/dL (ref 0.0–0.3)
BILIRUBIN TOTAL: 0.7 mg/dL (ref 0.2–1.2)
TOTAL PROTEIN: 7 g/dL (ref 6.0–8.3)

## 2014-08-27 LAB — TSH: TSH: 0.92 u[IU]/mL (ref 0.35–4.50)

## 2014-08-27 LAB — VITAMIN B12: Vitamin B-12: >1500 pg/mL — ABNORMAL HIGH (ref 211–911)

## 2014-08-30 ENCOUNTER — Other Ambulatory Visit: Payer: Self-pay

## 2014-08-30 MED ORDER — POTASSIUM CHLORIDE CRYS ER 20 MEQ PO TBCR: 20.0000 meq | EXTENDED_RELEASE_TABLET | Freq: Every day | ORAL | Status: DC

## 2014-09-19 ENCOUNTER — Encounter: Payer: Self-pay | Admitting: Family Medicine

## 2014-10-10 ENCOUNTER — Telehealth: Payer: Self-pay | Admitting: Hematology and Oncology

## 2014-10-10 ENCOUNTER — Encounter: Payer: Self-pay | Admitting: Hematology and Oncology

## 2014-10-10 ENCOUNTER — Ambulatory Visit (HOSPITAL_BASED_OUTPATIENT_CLINIC_OR_DEPARTMENT_OTHER): Payer: PRIVATE HEALTH INSURANCE | Admitting: Hematology and Oncology

## 2014-10-10 ENCOUNTER — Other Ambulatory Visit (HOSPITAL_BASED_OUTPATIENT_CLINIC_OR_DEPARTMENT_OTHER): Payer: PRIVATE HEALTH INSURANCE

## 2014-10-10 VITALS — BP 122/74 | HR 86 | Temp 98.2°F | Resp 18 | Ht 66.0 in | Wt 228.6 lb

## 2014-10-10 DIAGNOSIS — C8400 Mycosis fungoides, unspecified site: Secondary | ICD-10-CM

## 2014-10-10 DIAGNOSIS — E538 Deficiency of other specified B group vitamins: Secondary | ICD-10-CM | POA: Diagnosis not present

## 2014-10-10 DIAGNOSIS — C8590 Non-Hodgkin lymphoma, unspecified, unspecified site: Secondary | ICD-10-CM

## 2014-10-10 DIAGNOSIS — E785 Hyperlipidemia, unspecified: Secondary | ICD-10-CM

## 2014-10-10 LAB — COMPREHENSIVE METABOLIC PANEL (CC13)
ALT: 24 U/L (ref 0–55)
ANION GAP: 10 meq/L (ref 3–11)
AST: 20 U/L (ref 5–34)
Albumin: 3.5 g/dL (ref 3.5–5.0)
Alkaline Phosphatase: 131 U/L (ref 40–150)
BILIRUBIN TOTAL: 0.42 mg/dL (ref 0.20–1.20)
BUN: 13.7 mg/dL (ref 7.0–26.0)
CALCIUM: 9.7 mg/dL (ref 8.4–10.4)
CO2: 25 meq/L (ref 22–29)
CREATININE: 0.8 mg/dL (ref 0.6–1.1)
Chloride: 106 mEq/L (ref 98–109)
EGFR: 80 mL/min/{1.73_m2} — ABNORMAL LOW (ref >90)
Glucose: 92 mg/dl (ref 70–140)
Potassium: 3.9 mEq/L (ref 3.5–5.1)
Sodium: 141 mEq/L (ref 136–145)
TOTAL PROTEIN: 6.6 g/dL (ref 6.4–8.3)

## 2014-10-10 LAB — CBC WITH DIFFERENTIAL/PLATELET
BASO%: 0.4 % (ref 0.0–2.0)
Basophils Absolute: 0 10*3/uL (ref 0.0–0.1)
EOS ABS: 0.1 10*3/uL (ref 0.0–0.5)
EOS%: 1.3 % (ref 0.0–7.0)
HEMATOCRIT: 38.5 % (ref 34.8–46.6)
HGB: 12.7 g/dL (ref 11.6–15.9)
LYMPH#: 1 10*3/uL (ref 0.9–3.3)
LYMPH%: 19.5 % (ref 14.0–49.7)
MCH: 29.1 pg (ref 25.1–34.0)
MCHC: 33 g/dL (ref 31.5–36.0)
MCV: 88.3 fL (ref 79.5–101.0)
MONO#: 0.5 10*3/uL (ref 0.1–0.9)
MONO%: 8.8 % (ref 0.0–14.0)
NEUT#: 3.7 10*3/uL (ref 1.5–6.5)
NEUT%: 70 % (ref 38.4–76.8)
PLATELETS: 253 10*3/uL (ref 145–400)
RBC: 4.36 10*6/uL (ref 3.70–5.45)
RDW: 13.7 % (ref 11.2–14.5)
WBC: 5.3 10*3/uL (ref 3.9–10.3)

## 2014-10-10 LAB — LACTATE DEHYDROGENASE (CC13): LDH: 187 U/L (ref 125–245)

## 2014-10-10 NOTE — Telephone Encounter (Signed)
s.w pt and advised on jan 2017 appt....pt ok and aware °

## 2014-10-10 NOTE — Progress Notes (Signed)
[place] OFFICE PROGRESS NOTE  Patient Care Team: Rosalita Chessman, DO as PCP - General Fanny Skates, MD as Consulting Physician (General Surgery) Heath Lark, MD as Consulting Physician (Hematology and Oncology)  SUMMARY OF ONCOLOGIC HISTORY: Oncology History   Anaplastic Lymphoma, T-cell, 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)   Staging method: AJCC 7th Edition   Clinical: Stage II signed by Heath Lark, MD on 04/03/2013  3:31 PM   Summary: Stage II      Lymphoma, T-cell   02/07/2013 Imaging [place] confirmed enlarged LN   03/09/2013 Procedure LN biopsy confirmed T cell lymphoma   03/23/2013 Procedure She has port placement   03/26/2013 Imaging Echo showed normal ejection fraction   03/29/2013 Bone Marrow Biopsy Bone marrow biopsy was negative   04/02/2013 Imaging PET/CT scan showed localized, stage II disease   04/04/2013 - 07/20/2013 Chemotherapy She has completed 6 cycles of CHOP plus etoposide chemotherapy.   06/01/2013 Imaging PET CT scan show complete response to treatment   06/27/2013 Adverse Reaction Dose of vincristine was reduced due to peripheral neuropathy.   08/31/2013 Imaging Repeat PET scans show no evidence of disease.   06/10/2014 Imaging CT chest showed no evidence of recurrence    INTERVAL HISTORY: Please see below for problem oriented charting. She returns for further follow-up. The patient thought she felt a knot under the left armpit recently. It has not changed or caused any discomfort. She denies recent infection. Her primary doctor recently switch her vitamin B-12 supplement from injection to an oral pill. She was also recently started on potassium supplement for hypokalemia. She is not symptomatic from hypokalemia.  REVIEW OF SYSTEMS:   Constitutional: Denies fevers, chills or abnormal weight loss Eyes: Denies blurriness of vision Ears, nose, mouth, throat, and face: Denies mucositis or sore throat Respiratory: Denies  cough, dyspnea or wheezes Cardiovascular: Denies palpitation, chest discomfort or lower extremity swelling Gastrointestinal:  Denies nausea, heartburn or change in bowel habits Skin: Denies abnormal skin rashes Lymphatics: Denies new lymphadenopathy or easy bruising Neurological:Denies numbness, tingling or new weaknesses Behavioral/Psych: Mood is stable, no new changes  All other systems were reviewed with the patient and are negative.  I have reviewed the past medical history, past surgical history, social history and family history with the patient and they are unchanged from previous note.  ALLERGIES:  is allergic to betadine and codeine.  MEDICATIONS:  Current Outpatient Prescriptions  Medication Sig Dispense Refill  . ALPRAZolam (XANAX) 0.25 MG tablet Take 1 tablet (0.25 mg total) by mouth 2 (two) times daily as needed for anxiety. 90 tablet 0  . aspirin 81 MG tablet Take 81 mg by mouth every other day.     Marland Kitchen buPROPion (WELLBUTRIN XL) 150 MG 24 hr tablet Take 1 tablet (150 mg total) by mouth daily. 90 tablet 1  . cholecalciferol (VITAMIN D) 1000 UNITS tablet Take 1,000 Units by mouth daily.    Marland Kitchen FLUoxetine (PROZAC) 20 MG tablet Take 2 tablets (40 mg total) by mouth daily. 180 tablet 3  . lisinopril-hydrochlorothiazide (PRINZIDE,ZESTORETIC) 20-25 MG per tablet 1 tab by mouth daily--Office visit due now 90 tablet 3  . omeprazole (PRILOSEC OTC) 20 MG tablet Take 1 tablet (20 mg total) by mouth daily as needed. 30 tablet 10  . potassium chloride SA (K-DUR,KLOR-CON) 20 MEQ tablet Take 1 tablet (20 mEq total) by mouth daily. 30 tablet 2  . valACYclovir (VALTREX) 1000 MG tablet Take 1 tablet (1,000 mg  total) by mouth 3 (three) times daily. 21 tablet 0  . vitamin B-12 (CYANOCOBALAMIN) 1000 MCG tablet Take 1,000 mcg by mouth daily.     No current facility-administered medications for this visit.    PHYSICAL EXAMINATION: ECOG PERFORMANCE STATUS: 0 - Asymptomatic  Filed Vitals:    10/10/14 0901  BP: 122/74  Pulse: 86  Temp: 98.2 F (36.8 C)  Resp: 18   Filed Weights   10/10/14 0901  Weight: 228 lb 9.6 oz (103.692 kg)    GENERAL:alert, no distress and comfortable SKIN: skin color, texture, turgor are normal, no rashes or significant lesions EYES: normal, Conjunctiva are pink and non-injected, sclera clear OROPHARYNX:no exudate, no erythema and lips, buccal mucosa, and tongue normal  NECK: supple, thyroid normal size, non-tender, without nodularity LYMPH:  no palpable lymphadenopathy in the cervical, axillary or inguinal LUNGS: clear to auscultation and percussion with normal breathing effort HEART: regular rate & rhythm and no murmurs and no lower extremity edema ABDOMEN:abdomen soft, non-tender and normal bowel sounds Musculoskeletal:no cyanosis of digits and no clubbing  NEURO: alert & oriented x 3 with fluent speech, no focal motor/sensory deficits  LABORATORY DATA:  I have reviewed the data as listed    Component Value Date/Time   NA 137 08/26/2014 1642   NA 140 06/07/2014 0821   K 3.0* 08/26/2014 1642   K 3.9 06/07/2014 0821   CL 98 08/26/2014 1642   CO2 29 08/26/2014 1642   CO2 25 06/07/2014 0821   GLUCOSE 75 08/26/2014 1642   GLUCOSE 94 06/07/2014 0821   BUN 16 08/26/2014 1642   BUN 15.9 06/07/2014 0821   CREATININE 0.83 08/26/2014 1642   CREATININE 0.8 06/07/2014 0821   CALCIUM 9.6 08/26/2014 1642   CALCIUM 9.4 06/07/2014 0821   PROT 7.0 08/26/2014 1642   PROT 6.7 06/07/2014 0821   ALBUMIN 4.0 08/26/2014 1642   ALBUMIN 3.6 06/07/2014 0821   AST 20 08/26/2014 1642   AST 19 06/07/2014 0821   ALT 26 08/26/2014 1642   ALT 22 06/07/2014 0821   ALKPHOS 128* 08/26/2014 1642   ALKPHOS 146 06/07/2014 0821   BILITOT 0.7 08/26/2014 1642   BILITOT 0.36 06/07/2014 0821   GFRNONAA 77* 03/23/2013 0953   GFRAA 90* 03/23/2013 0953    No results found for: SPEP, UPEP  Lab Results  Component Value Date   WBC 5.3 10/10/2014   NEUTROABS 3.7  10/10/2014   HGB 12.7 10/10/2014   HCT 38.5 10/10/2014   MCV 88.3 10/10/2014   PLT 253 10/10/2014      Chemistry      Component Value Date/Time   NA 137 08/26/2014 1642   NA 140 06/07/2014 0821   K 3.0* 08/26/2014 1642   K 3.9 06/07/2014 0821   CL 98 08/26/2014 1642   CO2 29 08/26/2014 1642   CO2 25 06/07/2014 0821   BUN 16 08/26/2014 1642   BUN 15.9 06/07/2014 0821   CREATININE 0.83 08/26/2014 1642   CREATININE 0.8 06/07/2014 0821      Component Value Date/Time   CALCIUM 9.6 08/26/2014 1642   CALCIUM 9.4 06/07/2014 0821   ALKPHOS 128* 08/26/2014 1642   ALKPHOS 146 06/07/2014 0821   AST 20 08/26/2014 1642   AST 19 06/07/2014 0821   ALT 26 08/26/2014 1642   ALT 22 06/07/2014 0821   BILITOT 0.7 08/26/2014 1642   BILITOT 0.36 06/07/2014 0821      ASSESSMENT & PLAN:  Lymphoma, T-cell Patient is status post CHOP/etoposide chemotherapy completed  June 2015. Most recent restaging scans showed no recurrence.  What she perceived as a knot in the left armpit area is nonpalpable today. I reassured the patient She will continue under observation.  I reinforced the importance of preventative mammogram and Pap smear.  I plan to see her back in 4 months to repeat history, physical examination and blood work We also discussed the importance of influenza vaccination once the flu season starts.  Vitamin B12 deficiency The most recent vitamin B-12 level was adequate and she is now switched to an oral vitamin B-12 supplement. I will defer to her primary care doctor for future monitoring   No orders of the defined types were placed in this encounter.   All questions were answered. The patient knows to call the clinic with any problems, questions or concerns. No barriers to learning was detected. I spent 15 minutes counseling the patient face to face. The total time spent in the appointment was 20 minutes and more than 50% was on counseling and review of test results     [facility], [place],  MD 10/10/2014 9:31 AM

## 2014-10-10 NOTE — Assessment & Plan Note (Addendum)
Patient is status post CHOP/etoposide chemotherapy completed June 2015. Most recent restaging scans showed no recurrence.  What she perceived as a knot in the left armpit area is nonpalpable today. I reassured the patient She will continue under observation.  I reinforced the importance of preventative mammogram and Pap smear.  I plan to see her back in 4 months to repeat history, physical examination and blood work We also discussed the importance of influenza vaccination once the flu season starts.

## 2014-10-10 NOTE — Assessment & Plan Note (Signed)
The most recent vitamin B-12 level was adequate and she is now switched to an oral vitamin B-12 supplement. I will defer to her primary care doctor for future monitoring

## 2015-02-20 ENCOUNTER — Telehealth: Payer: Self-pay | Admitting: Hematology and Oncology

## 2015-02-20 ENCOUNTER — Encounter: Payer: Self-pay | Admitting: Hematology and Oncology

## 2015-02-20 ENCOUNTER — Other Ambulatory Visit (HOSPITAL_BASED_OUTPATIENT_CLINIC_OR_DEPARTMENT_OTHER): Payer: PRIVATE HEALTH INSURANCE

## 2015-02-20 ENCOUNTER — Ambulatory Visit (HOSPITAL_BASED_OUTPATIENT_CLINIC_OR_DEPARTMENT_OTHER): Payer: PRIVATE HEALTH INSURANCE | Admitting: Hematology and Oncology

## 2015-02-20 VITALS — BP 117/73 | HR 81 | Temp 98.2°F | Resp 18 | Ht 66.0 in | Wt 234.1 lb

## 2015-02-20 DIAGNOSIS — Z8572 Personal history of non-Hodgkin lymphomas: Secondary | ICD-10-CM

## 2015-02-20 DIAGNOSIS — E785 Hyperlipidemia, unspecified: Secondary | ICD-10-CM

## 2015-02-20 DIAGNOSIS — C8590 Non-Hodgkin lymphoma, unspecified, unspecified site: Secondary | ICD-10-CM | POA: Diagnosis not present

## 2015-02-20 DIAGNOSIS — Z8579 Personal history of other malignant neoplasms of lymphoid, hematopoietic and related tissues: Secondary | ICD-10-CM | POA: Diagnosis not present

## 2015-02-20 LAB — COMPREHENSIVE METABOLIC PANEL
ALT: 36 U/L (ref 0–55)
ANION GAP: 10 meq/L (ref 3–11)
AST: 21 U/L (ref 5–34)
Albumin: 3.8 g/dL (ref 3.5–5.0)
Alkaline Phosphatase: 158 U/L — ABNORMAL HIGH (ref 40–150)
BUN: 15.3 mg/dL (ref 7.0–26.0)
CHLORIDE: 102 meq/L (ref 98–109)
CO2: 27 meq/L (ref 22–29)
Calcium: 9.9 mg/dL (ref 8.4–10.4)
Creatinine: 0.9 mg/dL (ref 0.6–1.1)
EGFR: 71 mL/min/{1.73_m2} — AB (ref >90)
Glucose: 83 mg/dl (ref 70–140)
Potassium: 3.8 mEq/L (ref 3.5–5.1)
Sodium: 139 mEq/L (ref 136–145)
Total Bilirubin: 0.9 mg/dL (ref 0.20–1.20)
Total Protein: 7.3 g/dL (ref 6.4–8.3)

## 2015-02-20 LAB — CBC WITH DIFFERENTIAL/PLATELET
BASO%: 0.8 % (ref 0.0–2.0)
Basophils Absolute: 0 10*3/uL (ref 0.0–0.1)
EOS%: 1.4 % (ref 0.0–7.0)
Eosinophils Absolute: 0.1 10*3/uL (ref 0.0–0.5)
HCT: 40.2 % (ref 34.8–46.6)
HGB: 13.2 g/dL (ref 11.6–15.9)
LYMPH%: 20.1 % (ref 14.0–49.7)
MCH: 28.1 pg (ref 25.1–34.0)
MCHC: 32.8 g/dL (ref 31.5–36.0)
MCV: 85.8 fL (ref 79.5–101.0)
MONO#: 0.6 10*3/uL (ref 0.1–0.9)
MONO%: 10 % (ref 0.0–14.0)
NEUT#: 3.9 10*3/uL (ref 1.5–6.5)
NEUT%: 67.7 % (ref 38.4–76.8)
PLATELETS: 279 10*3/uL (ref 145–400)
RBC: 4.68 10*6/uL (ref 3.70–5.45)
RDW: 13.9 % (ref 11.2–14.5)
WBC: 5.7 10*3/uL (ref 3.9–10.3)
lymph#: 1.1 10*3/uL (ref 0.9–3.3)

## 2015-02-20 LAB — LACTATE DEHYDROGENASE: LDH: 173 U/L (ref 125–245)

## 2015-02-20 NOTE — Telephone Encounter (Signed)
per pfo to sch pt appt-gave pt copy of avs °

## 2015-02-21 NOTE — Assessment & Plan Note (Signed)
Patient is status post CHOP/etoposide chemotherapy completed June 2015. Most recent restaging scans showed no recurrence.  What she perceived as a knot in the left armpit area is nonpalpable today. I reassured the patient She will continue under observation.  I reinforced the importance of preventative mammogram and Pap smear.  I plan to see her back in 4 months to repeat history, physical examination and blood work

## 2015-02-21 NOTE — Progress Notes (Signed)
[place] OFFICE PROGRESS NOTE  Patient Care Team: Rosalita Chessman, DO as PCP - General Fanny Skates, MD as Consulting Physician (General Surgery) Heath Lark, MD as Consulting Physician (Hematology and Oncology)  SUMMARY OF ONCOLOGIC HISTORY: Oncology History   Anaplastic Lymphoma, T-cell, 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)   Staging method: AJCC 7th Edition   Clinical: Stage II signed by Heath Lark, MD on 04/03/2013  3:31 PM   Summary: Stage II      History of lymphoma   02/07/2013 Imaging [place] confirmed enlarged LN   03/09/2013 Procedure LN biopsy confirmed T cell lymphoma   03/23/2013 Procedure She has port placement   03/26/2013 Imaging Echo showed normal ejection fraction   03/29/2013 Bone Marrow Biopsy Bone marrow biopsy was negative   04/02/2013 Imaging PET/CT scan showed localized, stage II disease   04/04/2013 - 07/20/2013 Chemotherapy She has completed 6 cycles of CHOP plus etoposide chemotherapy.   06/01/2013 Imaging PET CT scan show complete response to treatment   06/27/2013 Adverse Reaction Dose of vincristine was reduced due to peripheral neuropathy.   08/31/2013 Imaging Repeat PET scans show no evidence of disease.   06/10/2014 Imaging CT chest showed no evidence of recurrence    INTERVAL HISTORY: Please see below for problem oriented charting.  she feels well. Denies recent infection. No new lymphadenopathy. She is up-to-date with all his preventive screening programs.  REVIEW OF SYSTEMS:   Constitutional: Denies fevers, chills or abnormal weight loss Eyes: Denies blurriness of vision Ears, nose, mouth, throat, and face: Denies mucositis or sore throat Respiratory: Denies cough, dyspnea or wheezes Cardiovascular: Denies palpitation, chest discomfort or lower extremity swelling Gastrointestinal:  Denies nausea, heartburn or change in bowel habits Skin: Denies abnormal skin rashes Lymphatics: Denies new lymphadenopathy or  easy bruising Neurological:Denies numbness, tingling or new weaknesses Behavioral/Psych: Mood is stable, no new changes  All other systems were reviewed with the patient and are negative.  I have reviewed the past medical history, past surgical history, social history and family history with the patient and they are unchanged from previous note.  ALLERGIES:  is allergic to betadine and codeine.  MEDICATIONS:  Current Outpatient Prescriptions  Medication Sig Dispense Refill  . aspirin 81 MG tablet Take 81 mg by mouth every other day.     Marland Kitchen buPROPion (WELLBUTRIN XL) 150 MG 24 hr tablet Take 1 tablet (150 mg total) by mouth daily. 90 tablet 1  . cholecalciferol (VITAMIN D) 1000 UNITS tablet Take 1,000 Units by mouth daily.    Marland Kitchen FLUoxetine (PROZAC) 20 MG tablet Take 2 tablets (40 mg total) by mouth daily. 180 tablet 3  . lisinopril-hydrochlorothiazide (PRINZIDE,ZESTORETIC) 20-25 MG per tablet 1 tab by mouth daily--Office visit due now 90 tablet 3  . omeprazole (PRILOSEC OTC) 20 MG tablet Take 1 tablet (20 mg total) by mouth daily as needed. 30 tablet 10  . valACYclovir (VALTREX) 1000 MG tablet Take 1 tablet (1,000 mg total) by mouth 3 (three) times daily. (Patient taking differently: Take 1,000 mg by mouth as needed. ) 21 tablet 0  . vitamin B-12 (CYANOCOBALAMIN) 1000 MCG tablet Take 1,000 mcg by mouth daily.     No current facility-administered medications for this visit.    PHYSICAL EXAMINATION: ECOG PERFORMANCE STATUS: 0 - Asymptomatic  Filed Vitals:   02/20/15 0842  BP: 117/73  Pulse: 81  Temp: 98.2 F (36.8 C)  Resp: 18   Filed Weights   02/20/15 4008  Weight: 234 lb 1.6 oz (106.187 kg)    GENERAL:alert, no distress and comfortable SKIN: skin color, texture, turgor are normal, no rashes or significant lesions EYES: normal, Conjunctiva are pink and non-injected, sclera clear OROPHARYNX:no exudate, no erythema and lips, buccal mucosa, and tongue normal  NECK: supple,  thyroid normal size, non-tender, without nodularity LYMPH:  no palpable lymphadenopathy in the cervical, axillary or inguinal LUNGS: clear to auscultation and percussion with normal breathing effort HEART: regular rate & rhythm and no murmurs and no lower extremity edema ABDOMEN:abdomen soft, non-tender and normal bowel sounds Musculoskeletal:no cyanosis of digits and no clubbing  NEURO: alert & oriented x 3 with fluent speech, no focal motor/sensory deficits  LABORATORY DATA:  I have reviewed the data as listed    Component Value Date/Time   NA 139 02/20/2015 0820   NA 137 08/26/2014 1642   K 3.8 02/20/2015 0820   K 3.0* 08/26/2014 1642   CL 98 08/26/2014 1642   CO2 27 02/20/2015 0820   CO2 29 08/26/2014 1642   GLUCOSE 83 02/20/2015 0820   GLUCOSE 75 08/26/2014 1642   BUN 15.3 02/20/2015 0820   BUN 16 08/26/2014 1642   CREATININE 0.9 02/20/2015 0820   CREATININE 0.83 08/26/2014 1642   CALCIUM 9.9 02/20/2015 0820   CALCIUM 9.6 08/26/2014 1642   PROT 7.3 02/20/2015 0820   PROT 7.0 08/26/2014 1642   ALBUMIN 3.8 02/20/2015 0820   ALBUMIN 4.0 08/26/2014 1642   AST 21 02/20/2015 0820   AST 20 08/26/2014 1642   ALT 36 02/20/2015 0820   ALT 26 08/26/2014 1642   ALKPHOS 158* 02/20/2015 0820   ALKPHOS 128* 08/26/2014 1642   BILITOT 0.90 02/20/2015 0820   BILITOT 0.7 08/26/2014 1642   GFRNONAA 77* 03/23/2013 0953   GFRAA 90* 03/23/2013 0953    No results found for: SPEP, UPEP  Lab Results  Component Value Date   WBC 5.7 02/20/2015   NEUTROABS 3.9 02/20/2015   HGB 13.2 02/20/2015   HCT 40.2 02/20/2015   MCV 85.8 02/20/2015   PLT 279 02/20/2015      Chemistry      Component Value Date/Time   NA 139 02/20/2015 0820   NA 137 08/26/2014 1642   K 3.8 02/20/2015 0820   K 3.0* 08/26/2014 1642   CL 98 08/26/2014 1642   CO2 27 02/20/2015 0820   CO2 29 08/26/2014 1642   BUN 15.3 02/20/2015 0820   BUN 16 08/26/2014 1642   CREATININE 0.9 02/20/2015 0820   CREATININE 0.83  08/26/2014 1642      Component Value Date/Time   CALCIUM 9.9 02/20/2015 0820   CALCIUM 9.6 08/26/2014 1642   ALKPHOS 158* 02/20/2015 0820   ALKPHOS 128* 08/26/2014 1642   AST 21 02/20/2015 0820   AST 20 08/26/2014 1642   ALT 36 02/20/2015 0820   ALT 26 08/26/2014 1642   BILITOT 0.90 02/20/2015 0820   BILITOT 0.7 08/26/2014 1642       ASSESSMENT & PLAN:  History of lymphoma Patient is status post CHOP/etoposide chemotherapy completed June 2015. Most recent restaging scans showed no recurrence.  What she perceived as a knot in the left armpit area is nonpalpable today. I reassured the patient She will continue under observation.  I reinforced the importance of preventative mammogram and Pap smear.  I plan to see her back in 4 months to repeat history, physical examination and blood work   No orders of the defined types were placed in this encounter.  All questions were answered. The patient knows to call the clinic with any problems, questions or concerns. No barriers to learning was detected. I spent 15 minutes counseling the patient face to face. The total time spent in the appointment was 20 minutes and more than 50% was on counseling and review of test results     [facility], [place], MD 02/21/2015 5:03 PM

## 2015-03-23 ENCOUNTER — Other Ambulatory Visit: Payer: Self-pay | Admitting: Family Medicine

## 2015-03-24 NOTE — Telephone Encounter (Signed)
Med filled with 0 refills, pt is overdue for a BP follow up.

## 2015-05-12 ENCOUNTER — Other Ambulatory Visit: Payer: Self-pay | Admitting: Family Medicine

## 2015-05-12 ENCOUNTER — Telehealth: Payer: Self-pay | Admitting: Family Medicine

## 2015-05-12 DIAGNOSIS — I10 Essential (primary) hypertension: Secondary | ICD-10-CM

## 2015-05-12 MED ORDER — LISINOPRIL-HYDROCHLOROTHIAZIDE 20-25 MG PO TABS: ORAL_TABLET | ORAL | Status: DC

## 2015-05-12 NOTE — Telephone Encounter (Signed)
Relationship to patient: Self   Can be reached: [phone]  Pharmacy: [id number] - [place], [place]  Reason for call: pt is requesting a refill request on lisinopril-hydrochlorothiazide

## 2015-05-12 NOTE — Telephone Encounter (Signed)
Please schedule the patient an apt.     KP 

## 2015-05-12 NOTE — Telephone Encounter (Signed)
90 day supply of fluoxetine sent to pharmacy. Pt was due for a 6 month follow up with Dr Etter Sjogren in 02/2015 and is past due. Please call pt and arrange f/u with Lowne soon.  Thanks!

## 2015-05-13 ENCOUNTER — Other Ambulatory Visit: Payer: Self-pay

## 2015-05-13 DIAGNOSIS — I10 Essential (primary) hypertension: Secondary | ICD-10-CM

## 2015-05-13 MED ORDER — LISINOPRIL-HYDROCHLOROTHIAZIDE 20-25 MG PO TABS: ORAL_TABLET | ORAL | Status: DC

## 2015-05-13 MED ORDER — FLUOXETINE HCL 40 MG PO CAPS: 40.0000 mg | ORAL_CAPSULE | Freq: Every day | ORAL | Status: DC

## 2015-05-13 NOTE — Telephone Encounter (Signed)
Left message on patients voicemail informing her to call the office to schedule an appointment with Dr. Etter Sjogren per below

## 2015-05-13 NOTE — Telephone Encounter (Signed)
Pt has been scheduled.  °

## 2015-05-20 ENCOUNTER — Encounter: Payer: Self-pay | Admitting: Family Medicine

## 2015-05-20 ENCOUNTER — Ambulatory Visit (INDEPENDENT_AMBULATORY_CARE_PROVIDER_SITE_OTHER): Payer: Managed Care, Other (non HMO) | Admitting: Family Medicine

## 2015-05-20 VITALS — BP 120/80 | HR 71 | Temp 98.1°F | Ht 66.0 in | Wt 237.8 lb

## 2015-05-20 DIAGNOSIS — E785 Hyperlipidemia, unspecified: Secondary | ICD-10-CM

## 2015-05-20 DIAGNOSIS — I10 Essential (primary) hypertension: Secondary | ICD-10-CM | POA: Diagnosis not present

## 2015-05-20 DIAGNOSIS — R51 Headache: Secondary | ICD-10-CM

## 2015-05-20 DIAGNOSIS — R519 Headache, unspecified: Secondary | ICD-10-CM

## 2015-05-20 LAB — LIPID PANEL
Cholesterol: 201 mg/dL — ABNORMAL HIGH (ref 0–200)
HDL: 73.1 mg/dL (ref >39.00)
LDL Cholesterol: 112 mg/dL — ABNORMAL HIGH (ref 0–99)
NonHDL: 128.17
TRIGLYCERIDES: 83 mg/dL (ref 0.0–149.0)
Total CHOL/HDL Ratio: 3
VLDL: 16.6 mg/dL (ref 0.0–40.0)

## 2015-05-20 LAB — COMPREHENSIVE METABOLIC PANEL
ALT: 15 U/L (ref 0–35)
AST: 14 U/L (ref 0–37)
Albumin: 4.1 g/dL (ref 3.5–5.2)
Alkaline Phosphatase: 144 U/L — ABNORMAL HIGH (ref 39–117)
BUN: 19 mg/dL (ref 6–23)
CALCIUM: 9.7 mg/dL (ref 8.4–10.5)
CHLORIDE: 99 meq/L (ref 96–112)
CO2: 32 meq/L (ref 19–32)
Creatinine, Ser: 0.78 mg/dL (ref 0.40–1.20)
GFR: 82.27 mL/min (ref >60.00)
GLUCOSE: 93 mg/dL (ref 70–99)
POTASSIUM: 3.3 meq/L — AB (ref 3.5–5.1)
Sodium: 137 mEq/L (ref 135–145)
Total Bilirubin: 0.7 mg/dL (ref 0.2–1.2)
Total Protein: 7.1 g/dL (ref 6.0–8.3)

## 2015-05-20 MED ORDER — LEVOCETIRIZINE DIHYDROCHLORIDE 5 MG PO TABS: 5.0000 mg | ORAL_TABLET | Freq: Every evening | ORAL | Status: DC

## 2015-05-20 MED ORDER — FLUTICASONE PROPIONATE 50 MCG/ACT NA SUSP
2.0000 | Freq: Every day | NASAL | Status: DC
Start: 2015-05-20 — End: 2015-12-22

## 2015-05-20 MED ORDER — LISINOPRIL-HYDROCHLOROTHIAZIDE 20-25 MG PO TABS: ORAL_TABLET | ORAL | Status: DC

## 2015-05-20 NOTE — Patient Instructions (Signed)
Hypertension Hypertension, commonly called high blood pressure, is when the force of blood pumping through your arteries is too strong. Your arteries are the blood vessels that carry blood from your heart throughout your body. A blood pressure reading consists of a higher number over a lower number, such as 110/72. The higher number (systolic) is the pressure inside your arteries when your heart pumps. The lower number (diastolic) is the pressure inside your arteries when your heart relaxes. Ideally you want your blood pressure below 120/80. Hypertension forces your heart to work harder to pump blood. Your arteries may become narrow or stiff. Having untreated or uncontrolled hypertension can cause heart attack, stroke, kidney disease, and other problems. RISK FACTORS Some risk factors for high blood pressure are controllable. Others are not.  Risk factors you cannot control include:   Race. You may be at higher risk if you are African American.  Age. Risk increases with age.  Gender. Men are at higher risk than women before age 45 years. After age 65, women are at higher risk than men. Risk factors you can control include:  Not getting enough exercise or physical activity.  Being overweight.  Getting too much fat, sugar, calories, or salt in your diet.  Drinking too much alcohol. SIGNS AND SYMPTOMS Hypertension does not usually cause signs or symptoms. Extremely high blood pressure (hypertensive crisis) may cause headache, anxiety, shortness of breath, and nosebleed. DIAGNOSIS To check if you have hypertension, your health care provider will measure your blood pressure while you are seated, with your arm held at the level of your heart. It should be measured at least twice using the same arm. Certain conditions can cause a difference in blood pressure between your right and left arms. A blood pressure reading that is higher than normal on one occasion does not mean that you need treatment. If  it is not clear whether you have high blood pressure, you may be asked to return on a different day to have your blood pressure checked again. Or, you may be asked to monitor your blood pressure at home for 1 or more weeks. TREATMENT Treating high blood pressure includes making lifestyle changes and possibly taking medicine. Living a healthy lifestyle can help lower high blood pressure. You may need to change some of your habits. Lifestyle changes may include:  Following the DASH diet. This diet is high in fruits, vegetables, and whole grains. It is low in salt, red meat, and added sugars.  Keep your sodium intake below 2,300 mg per day.  Getting at least 30-45 minutes of aerobic exercise at least 4 times per week.  Losing weight if necessary.  Not smoking.  Limiting alcoholic beverages.  Learning ways to reduce stress. Your health care provider may prescribe medicine if lifestyle changes are not enough to get your blood pressure under control, and if one of the following is true:  You are 18-59 years of age and your systolic blood pressure is above 140.  You are 60 years of age or older, and your systolic blood pressure is above 150.  Your diastolic blood pressure is above 90.  You have diabetes, and your systolic blood pressure is over 140 or your diastolic blood pressure is over 90.  You have kidney disease and your blood pressure is above 140/90.  You have heart disease and your blood pressure is above 140/90. Your personal target blood pressure may vary depending on your medical conditions, your age, and other factors. HOME CARE INSTRUCTIONS    Have your blood pressure rechecked as directed by your health care provider.   Take medicines only as directed by your health care provider. Follow the directions carefully. Blood pressure medicines must be taken as prescribed. The medicine does not work as well when you skip doses. Skipping doses also puts you at risk for  problems.  Do not smoke.   Monitor your blood pressure at home as directed by your health care provider. SEEK MEDICAL CARE IF:   You think you are having a reaction to medicines taken.  You have recurrent headaches or feel dizzy.  You have swelling in your ankles.  You have trouble with your vision. SEEK IMMEDIATE MEDICAL CARE IF:  You develop a severe headache or confusion.  You have unusual weakness, numbness, or feel faint.  You have severe chest or abdominal pain.  You vomit repeatedly.  You have trouble breathing. MAKE SURE YOU:   Understand these instructions.  Will watch your condition.  Will get help right away if you are not doing well or get worse.   This information is not intended to replace advice given to you by your health care provider. Make sure you discuss any questions you have with your health care provider.   Document Released: 02/01/2005 Document Revised: 06/18/2014 Document Reviewed: 11/24/2012 Elsevier Interactive Patient Education 2016 Elsevier Inc.  

## 2015-05-20 NOTE — Progress Notes (Signed)
Patient ID: Anna Jackson, female    DOB: Jun 04, 1960  Age: 55 y.o. MRN: [id number]    Subjective:  Subjective HPI YASLENE MOIX presents for f/u bp and cholesterol  She is also c/o frontal headaches x 1  Month.  No congestion, fever.    Review of Systems  Constitutional: Negative for diaphoresis, appetite change, fatigue and unexpected weight change.  Eyes: Negative for pain, redness and visual disturbance.  Respiratory: Negative for cough, chest tightness, shortness of breath and wheezing.   Cardiovascular: Negative for chest pain, palpitations and leg swelling.  Endocrine: Negative for cold intolerance, heat intolerance, polydipsia, polyphagia and polyuria.  Genitourinary: Negative for dysuria, frequency and difficulty urinating.  Neurological: Positive for headaches. Negative for dizziness, light-headedness and numbness.    History Past Medical History  Diagnosis Date  . Hypertension   . Anxiety   . Esophageal reflux   . Anxiety state, unspecified   . Herpes simplex without mention of complication   . Unspecified essential hypertension   . Barrett esophagus   . Anemia   . Asthma     no problem with asthma in several years  . Unspecified asthma(493.90)   . Adenopathy     left axillary  . Lymphoma, T-cell ([place]) 03/20/2013  . Mental disorder   . Depression   . Headache 05/16/2013  . Skin infection 11/02/2013  . URI (upper respiratory infection) 01/15/2014  . Hyperlipidemia 03/12/2014  . Neuropathic pain 04/04/2014    She has past surgical history that includes Cholecystectomy; Abdominal hysterectomy (2006); Nissen fundoplication; Cervical conization w/bx; Tubal ligation; Knee surgery (02/2010); Axillary lymph node dissection (Left, 03/09/2013); and Portacath placement (N/A, 03/23/2013).   Her family history includes Breast cancer in her maternal grandmother; Cancer (age of onset: 76) in her maternal grandmother; Heart disease in her mother. There is no history of Colon  cancer.She reports that she has never smoked. She has never used smokeless tobacco. She reports that she drinks alcohol. She reports that she does not use illicit drugs.  Current Outpatient Prescriptions on File Prior to Visit  Medication Sig Dispense Refill  . aspirin 81 MG tablet Take 81 mg by mouth every other day.     Marland Kitchen buPROPion (WELLBUTRIN XL) 150 MG 24 hr tablet TAKE 1 TABLET (150 MG TOTAL) BY MOUTH DAILY. 90 tablet 0  . cholecalciferol (VITAMIN D) 1000 UNITS tablet Take 1,000 Units by mouth daily.    Marland Kitchen FLUoxetine (PROZAC) 40 MG capsule Take 1 capsule (40 mg total) by mouth daily. Office visit due now 30 capsule 0  . omeprazole (PRILOSEC OTC) 20 MG tablet Take 1 tablet (20 mg total) by mouth daily as needed. 30 tablet 10  . valACYclovir (VALTREX) 1000 MG tablet Take 1 tablet (1,000 mg total) by mouth 3 (three) times daily. (Patient taking differently: Take 1,000 mg by mouth as needed. ) 21 tablet 0  . vitamin B-12 (CYANOCOBALAMIN) 1000 MCG tablet Take 1,000 mcg by mouth daily.     No current facility-administered medications on file prior to visit.     Objective:  Objective Physical Exam  Constitutional: She is oriented to person, place, and time. She appears well-developed and well-nourished.  HENT:  Head: Normocephalic and atraumatic.  Eyes: Conjunctivae and EOM are normal.  Neck: Normal range of motion. Neck supple. No JVD present. Carotid bruit is not present. No thyromegaly present.  Cardiovascular: Normal rate, regular rhythm and normal heart sounds.   No murmur heard. Pulmonary/Chest: Effort normal and breath sounds normal.  No respiratory distress. She has no wheezes. She has no rales. She exhibits no tenderness.  Musculoskeletal: She exhibits no edema.  Neurological: She is alert and oriented to person, place, and time.  Psychiatric: She has a normal mood and affect. Her behavior is normal. Judgment and thought content normal.  Nursing note and vitals reviewed.  BP  120/80 mmHg  Pulse 71  Temp(Src) 98.1 F (36.7 C) (Oral)  Ht 5\' 6"  (1.676 m)  Wt 237 lb 12.8 oz (107.865 kg)  BMI 38.40 kg/m2  SpO2 98% Wt Readings from Last 3 Encounters:  05/20/15 237 lb 12.8 oz (107.865 kg)  02/20/15 234 lb 1.6 oz (106.187 kg)  10/10/14 228 lb 9.6 oz (103.692 kg)     Lab Results  Component Value Date   WBC 5.7 02/20/2015   HGB 13.2 02/20/2015   HCT 40.2 02/20/2015   PLT 279 02/20/2015   GLUCOSE 93 05/20/2015   CHOL 201* 05/20/2015   TRIG 83.0 05/20/2015   HDL 73.10 05/20/2015   LDLDIRECT 105.0 11/23/2010   LDLCALC 112* 05/20/2015   ALT 15 05/20/2015   AST 14 05/20/2015   NA 137 05/20/2015   K 3.3* 05/20/2015   CL 99 05/20/2015   CREATININE 0.78 05/20/2015   BUN 19 05/20/2015   CO2 32 05/20/2015   TSH 0.92 08/26/2014   INR 0.99 03/29/2013    Mm Digital Screening Bilateral  07/04/2014  CLINICAL DATA:  Screening. EXAM: DIGITAL SCREENING BILATERAL MAMMOGRAM WITH CAD COMPARISON:  Previous exam(s). ACR Breast Density Category b: There are scattered areas of fibroglandular density. FINDINGS: There are no findings suspicious for malignancy. Images were processed with CAD. IMPRESSION: No mammographic evidence of malignancy. A result letter of this screening mammogram will be mailed directly to the patient. RECOMMENDATION: Screening mammogram in one year. (Code:SM-B-01Y) BI-RADS CATEGORY  1: Negative. Electronically Signed   By: Claudie Revering M.D.   On: 07/04/2014 12:04     Assessment & Plan:  Plan I have changed Ms. [place]'s lisinopril-hydrochlorothiazide. I am also having her start on levocetirizine and fluticasone. Additionally, I am having her maintain her aspirin, valACYclovir, omeprazole, cholecalciferol, vitamin B-12, buPROPion, and FLUoxetine.  Meds ordered this encounter  Medications  . levocetirizine (XYZAL) 5 MG tablet    Sig: Take 1 tablet (5 mg total) by mouth every evening.    Dispense:  30 tablet    Refill:  5  . fluticasone (FLONASE)  50 MCG/ACT nasal spray    Sig: Place 2 sprays into both nostrils daily.    Dispense:  16 g    Refill:  6  . lisinopril-hydrochlorothiazide (PRINZIDE,ZESTORETIC) 20-25 MG tablet    Sig: 1 tab by mouth daily--    Dispense:  90 tablet    Refill:  1    Problem List Items Addressed This Visit      Unprioritized   Hyperlipidemia   Relevant Medications   lisinopril-hydrochlorothiazide (PRINZIDE,ZESTORETIC) 20-25 MG tablet   Other Relevant Orders   Lipid panel (Completed)    Other Visit Diagnoses    Essential hypertension    -  Primary    Relevant Medications    lisinopril-hydrochlorothiazide (PRINZIDE,ZESTORETIC) 20-25 MG tablet    Other Relevant Orders    Comprehensive metabolic panel (Completed)    Sinus headache        Relevant Medications    levocetirizine (XYZAL) 5 MG tablet    fluticasone (FLONASE) 50 MCG/ACT nasal spray       Follow-up: Return in about 6 months (around 11/19/2015),  or if symptoms worsen or fail to improve, for annual exam, fasting.  Ann Held, DO

## 2015-05-20 NOTE — Progress Notes (Signed)
Pre visit review using our clinic review tool, if applicable. No additional management support is needed unless otherwise documented below in the visit note. 

## 2015-06-19 ENCOUNTER — Telehealth: Payer: Self-pay | Admitting: Adult Health

## 2015-06-19 ENCOUNTER — Ambulatory Visit (HOSPITAL_BASED_OUTPATIENT_CLINIC_OR_DEPARTMENT_OTHER): Payer: Managed Care, Other (non HMO) | Admitting: Hematology and Oncology

## 2015-06-19 ENCOUNTER — Other Ambulatory Visit (HOSPITAL_BASED_OUTPATIENT_CLINIC_OR_DEPARTMENT_OTHER): Payer: Managed Care, Other (non HMO)

## 2015-06-19 ENCOUNTER — Encounter: Payer: Self-pay | Admitting: Hematology and Oncology

## 2015-06-19 VITALS — BP 126/75 | HR 83 | Temp 98.2°F | Resp 19 | Wt 237.5 lb

## 2015-06-19 DIAGNOSIS — Z8579 Personal history of other malignant neoplasms of lymphoid, hematopoietic and related tissues: Secondary | ICD-10-CM | POA: Diagnosis not present

## 2015-06-19 DIAGNOSIS — C8590 Non-Hodgkin lymphoma, unspecified, unspecified site: Secondary | ICD-10-CM

## 2015-06-19 DIAGNOSIS — T451X5A Adverse effect of antineoplastic and immunosuppressive drugs, initial encounter: Secondary | ICD-10-CM

## 2015-06-19 DIAGNOSIS — E785 Hyperlipidemia, unspecified: Secondary | ICD-10-CM

## 2015-06-19 DIAGNOSIS — G620 Drug-induced polyneuropathy: Secondary | ICD-10-CM | POA: Diagnosis not present

## 2015-06-19 DIAGNOSIS — Z515 Encounter for palliative care: Secondary | ICD-10-CM | POA: Insufficient documentation

## 2015-06-19 DIAGNOSIS — E669 Obesity, unspecified: Secondary | ICD-10-CM

## 2015-06-19 LAB — CBC WITH DIFFERENTIAL/PLATELET
BASO%: 1 % (ref 0.0–2.0)
Basophils Absolute: 0.1 10*3/uL (ref 0.0–0.1)
EOS%: 1.5 % (ref 0.0–7.0)
Eosinophils Absolute: 0.1 10*3/uL (ref 0.0–0.5)
HCT: 40.1 % (ref 34.8–46.6)
HEMOGLOBIN: 12.9 g/dL (ref 11.6–15.9)
LYMPH%: 21.4 % (ref 14.0–49.7)
MCH: 27.9 pg (ref 25.1–34.0)
MCHC: 32.1 g/dL (ref 31.5–36.0)
MCV: 86.8 fL (ref 79.5–101.0)
MONO#: 0.5 10*3/uL (ref 0.1–0.9)
MONO%: 8.8 % (ref 0.0–14.0)
NEUT%: 67.3 % (ref 38.4–76.8)
NEUTROS ABS: 3.6 10*3/uL (ref 1.5–6.5)
PLATELETS: 268 10*3/uL (ref 145–400)
RBC: 4.63 10*6/uL (ref 3.70–5.45)
RDW: 14.1 % (ref 11.2–14.5)
WBC: 5.3 10*3/uL (ref 3.9–10.3)
lymph#: 1.1 10*3/uL (ref 0.9–3.3)

## 2015-06-19 LAB — COMPREHENSIVE METABOLIC PANEL
ALBUMIN: 3.4 g/dL — AB (ref 3.5–5.0)
ALK PHOS: 139 U/L (ref 40–150)
ALT: 34 U/L (ref 0–55)
AST: 21 U/L (ref 5–34)
Anion Gap: 8 mEq/L (ref 3–11)
BILIRUBIN TOTAL: 0.53 mg/dL (ref 0.20–1.20)
BUN: 10 mg/dL (ref 7.0–26.0)
CALCIUM: 9.5 mg/dL (ref 8.4–10.4)
CO2: 28 mEq/L (ref 22–29)
Chloride: 104 mEq/L (ref 98–109)
Creatinine: 0.9 mg/dL (ref 0.6–1.1)
EGFR: 77 mL/min/{1.73_m2} — ABNORMAL LOW (ref >90)
Glucose: 91 mg/dl (ref 70–140)
POTASSIUM: 3.4 meq/L — AB (ref 3.5–5.1)
Sodium: 140 mEq/L (ref 136–145)
Total Protein: 6.6 g/dL (ref 6.4–8.3)

## 2015-06-19 LAB — LACTATE DEHYDROGENASE: LDH: 178 U/L (ref 125–245)

## 2015-06-19 NOTE — Telephone Encounter (Signed)
I briefly spoke with Ms. Gick to introduce myself and my role in her care, as Dr. Alvy Bimler has referred her to survivorship.  She states that she is looking forward to "graduating" to survivorship and will see me in November 2017.    Dr. Alvy Bimler would also like her to see the dietitian and the patient wanted that appt sooner than November.  I have rescheduled that appt for her to the end of May to see Dory Peru, RD.  I let the patient know that I would print out appt calendars for her and mail them to her home, along with my business card and information about survivorship.  I encouraged her to call me with any questions or concerns before her next visit at the cancer center; I would be happy to see her sooner if needed.   She voiced appreciation and understanding. I look forward to participating in her care!  [name] Craze, NP [place] [phone]

## 2015-06-19 NOTE — Assessment & Plan Note (Signed)
She is interested in healthy diet. I will consult medical nutrition to discuss with her.

## 2015-06-19 NOTE — Telephone Encounter (Signed)
per pof to sch pt appt-sent [employer] emailto sch surv appt-adv pt she will call-gave avs

## 2015-06-19 NOTE — Assessment & Plan Note (Signed)
The patient is a cancer survivor. We discussed increase physical activity and possible enrollment with the [employer] program with the [employer]/local gym I gave her additional resources from the [place]. We discussed importance of vitamin D supplementation. The patient is comfortable to transition her care to cancer survivorship.

## 2015-06-19 NOTE — Progress Notes (Signed)
[place] OFFICE PROGRESS NOTE  Patient Care Team: Ann Held, DO as PCP - General Fanny Skates, MD as Consulting Physician (General Surgery) Heath Lark, MD as Consulting Physician (Hematology and Oncology)  SUMMARY OF ONCOLOGIC HISTORY: Oncology History   Anaplastic Lymphoma, T-cell, 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)   Staging method: AJCC 7th Edition   Clinical: Stage II signed by Heath Lark, MD on 04/03/2013  3:31 PM   Summary: Stage II      History of lymphoma   02/07/2013 Imaging [place] confirmed enlarged LN   03/09/2013 Procedure LN biopsy confirmed T cell lymphoma   03/23/2013 Procedure She has port placement   03/26/2013 Imaging Echo showed normal ejection fraction   03/29/2013 Bone Marrow Biopsy Bone marrow biopsy was negative   04/02/2013 Imaging PET/CT scan showed localized, stage II disease   04/04/2013 - 07/20/2013 Chemotherapy She has completed 6 cycles of CHOP plus etoposide chemotherapy.   06/01/2013 Imaging PET CT scan show complete response to treatment   06/27/2013 Adverse Reaction Dose of vincristine was reduced due to peripheral neuropathy.   08/31/2013 Imaging Repeat PET scans show no evidence of disease.   06/10/2014 Imaging CT chest showed no evidence of recurrence    INTERVAL HISTORY: Please see below for problem oriented charting. She returns for further follow-up. She complained of very mild persistent neuropathy. No new lymphadenopathy. Denies recent infection.  REVIEW OF SYSTEMS:   Constitutional: Denies fevers, chills or abnormal weight loss Eyes: Denies blurriness of vision Ears, nose, mouth, throat, and face: Denies mucositis or sore throat Respiratory: Denies cough, dyspnea or wheezes Cardiovascular: Denies palpitation, chest discomfort or lower extremity swelling Gastrointestinal:  Denies nausea, heartburn or change in bowel habits Skin: Denies abnormal skin rashes Lymphatics: Denies new  lymphadenopathy or easy bruising Neurological:Denies numbness, tingling or new weaknesses Behavioral/Psych: Mood is stable, no new changes  All other systems were reviewed with the patient and are negative.  I have reviewed the past medical history, past surgical history, social history and family history with the patient and they are unchanged from previous note.  ALLERGIES:  is allergic to betadine and codeine.  MEDICATIONS:  Current Outpatient Prescriptions  Medication Sig Dispense Refill  . aspirin 81 MG tablet Take 81 mg by mouth every other day.     Marland Kitchen buPROPion (WELLBUTRIN XL) 150 MG 24 hr tablet TAKE 1 TABLET (150 MG TOTAL) BY MOUTH DAILY. 90 tablet 0  . cholecalciferol (VITAMIN D) 1000 UNITS tablet Take 1,000 Units by mouth daily.    Marland Kitchen FLUoxetine (PROZAC) 40 MG capsule Take 1 capsule (40 mg total) by mouth daily. Office visit due now 30 capsule 0  . fluticasone (FLONASE) 50 MCG/ACT nasal spray Place 2 sprays into both nostrils daily. 16 g 6  . levocetirizine (XYZAL) 5 MG tablet Take 1 tablet (5 mg total) by mouth every evening. 30 tablet 5  . lisinopril-hydrochlorothiazide (PRINZIDE,ZESTORETIC) 20-25 MG tablet 1 tab by mouth daily-- 90 tablet 1  . omeprazole (PRILOSEC OTC) 20 MG tablet Take 1 tablet (20 mg total) by mouth daily as needed. 30 tablet 10  . valACYclovir (VALTREX) 1000 MG tablet Take 1 tablet (1,000 mg total) by mouth 3 (three) times daily. (Patient taking differently: Take 1,000 mg by mouth as needed. ) 21 tablet 0  . vitamin B-12 (CYANOCOBALAMIN) 1000 MCG tablet Take 1,000 mcg by mouth daily.     No current facility-administered medications for this visit.  PHYSICAL EXAMINATION: ECOG PERFORMANCE STATUS: 0 - Asymptomatic  Filed Vitals:   06/19/15 0843  BP: 126/75  Pulse: 83  Temp: 98.2 F (36.8 C)  Resp: 19   Filed Weights   06/19/15 0843  Weight: 237 lb 8 oz (107.729 kg)    GENERAL:alert, no distress and comfortable. She is obese SKIN: skin color,  texture, turgor are normal, no rashes or significant lesions EYES: normal, Conjunctiva are pink and non-injected, sclera clear OROPHARYNX:no exudate, no erythema and lips, buccal mucosa, and tongue normal  NECK: supple, thyroid normal size, non-tender, without nodularity LYMPH:  no palpable lymphadenopathy in the cervical, axillary or inguinal LUNGS: clear to auscultation and percussion with normal breathing effort HEART: regular rate & rhythm and no murmurs and no lower extremity edema ABDOMEN:abdomen soft, non-tender and normal bowel sounds Musculoskeletal:no cyanosis of digits and no clubbing  NEURO: alert & oriented x 3 with fluent speech, no focal motor/sensory deficits  LABORATORY DATA:  I have reviewed the data as listed    Component Value Date/Time   NA 140 06/19/2015 0830   NA 137 05/20/2015 1101   K 3.4* 06/19/2015 0830   K 3.3* 05/20/2015 1101   CL 99 05/20/2015 1101   CO2 28 06/19/2015 0830   CO2 32 05/20/2015 1101   GLUCOSE 91 06/19/2015 0830   GLUCOSE 93 05/20/2015 1101   BUN 10.0 06/19/2015 0830   BUN 19 05/20/2015 1101   CREATININE 0.9 06/19/2015 0830   CREATININE 0.78 05/20/2015 1101   CALCIUM 9.5 06/19/2015 0830   CALCIUM 9.7 05/20/2015 1101   PROT 6.6 06/19/2015 0830   PROT 7.1 05/20/2015 1101   ALBUMIN 3.4* 06/19/2015 0830   ALBUMIN 4.1 05/20/2015 1101   AST 21 06/19/2015 0830   AST 14 05/20/2015 1101   ALT 34 06/19/2015 0830   ALT 15 05/20/2015 1101   ALKPHOS 139 06/19/2015 0830   ALKPHOS 144* 05/20/2015 1101   BILITOT 0.53 06/19/2015 0830   BILITOT 0.7 05/20/2015 1101   GFRNONAA 77* 03/23/2013 0953   GFRAA 90* 03/23/2013 0953    No results found for: SPEP, UPEP  Lab Results  Component Value Date   WBC 5.3 06/19/2015   NEUTROABS 3.6 06/19/2015   HGB 12.9 06/19/2015   HCT 40.1 06/19/2015   MCV 86.8 06/19/2015   PLT 268 06/19/2015      Chemistry      Component Value Date/Time   NA 140 06/19/2015 0830   NA 137 05/20/2015 1101   K 3.4*  06/19/2015 0830   K 3.3* 05/20/2015 1101   CL 99 05/20/2015 1101   CO2 28 06/19/2015 0830   CO2 32 05/20/2015 1101   BUN 10.0 06/19/2015 0830   BUN 19 05/20/2015 1101   CREATININE 0.9 06/19/2015 0830   CREATININE 0.78 05/20/2015 1101      Component Value Date/Time   CALCIUM 9.5 06/19/2015 0830   CALCIUM 9.7 05/20/2015 1101   ALKPHOS 139 06/19/2015 0830   ALKPHOS 144* 05/20/2015 1101   AST 21 06/19/2015 0830   AST 14 05/20/2015 1101   ALT 34 06/19/2015 0830   ALT 15 05/20/2015 1101   BILITOT 0.53 06/19/2015 0830   BILITOT 0.7 05/20/2015 1101      ASSESSMENT & PLAN:  History of lymphoma Patient is status post CHOP/etoposide chemotherapy completed June 2015. Last restaging scans in July 2015 showed no recurrence.  She will continue under observation.  I reinforced the importance of preventative mammogram and Pap smear.  We also discussed the importance  of influenza vaccination once the flu season starts. We discussed transitioning her care to cancer survivorship clinic. I will schedule her to see Elzie Rings in 6 months.  Neuropathy due to chemotherapeutic drug [facility]) This is stable.  The patient also has history of vitamin B 12 deficiency and is taking chronic vitamin B 12 replacement therapy.  Palliative care encounter The patient is a cancer survivor. We discussed increase physical activity and possible enrollment with the [employer] program with the [employer]/local gym I gave her additional resources from the [place]. We discussed importance of vitamin D supplementation. The patient is comfortable to transition her care to cancer survivorship.  Obesity (BMI 30-39.9) She is interested in healthy diet. I will consult medical nutrition to discuss with her.     Orders Placed This Encounter  Procedures  . Amb Referral to Survivorship Long term    Referral Priority:  Routine    Referral Type:  Consultation    Referred to Provider:  Holley Bouche, NP     Number of Visits Requested:  1  . Amb ref to Medical Nutrition Therapy-MNT    Referral Priority:  Routine    Referral Type:  Consultation    Referral Reason:  Specialty Services Required    Requested Specialty:  Nutrition    Number of Visits Requested:  1   All questions were answered. The patient knows to call the clinic with any problems, questions or concerns. No barriers to learning was detected. I spent 20 minutes counseling the patient face to face. The total time spent in the appointment was 25 minutes and more than 50% was on counseling and review of test results     [facility], [place], MD 06/19/2015 1:03 PM

## 2015-06-19 NOTE — Assessment & Plan Note (Signed)
Patient is status post CHOP/etoposide chemotherapy completed June 2015. Last restaging scans in July 2015 showed no recurrence.  She will continue under observation.  I reinforced the importance of preventative mammogram and Pap smear.  We also discussed the importance of influenza vaccination once the flu season starts. We discussed transitioning her care to cancer survivorship clinic. I will schedule her to see Elzie Rings in 6 months.

## 2015-06-19 NOTE — Assessment & Plan Note (Signed)
This is stable.  °The patient also has history of vitamin B 12 deficiency and is taking chronic vitamin B 12 replacement therapy. °

## 2015-06-29 ENCOUNTER — Encounter: Payer: Self-pay | Admitting: Family Medicine

## 2015-06-29 IMAGING — CR DG CHEST 2V
2 series · 2 of 2 positions shown · non-contrast
Comparison: None.

CLINICAL DATA: Preoperative films.

EXAM:
CHEST  2 VIEW

[w chest pa]
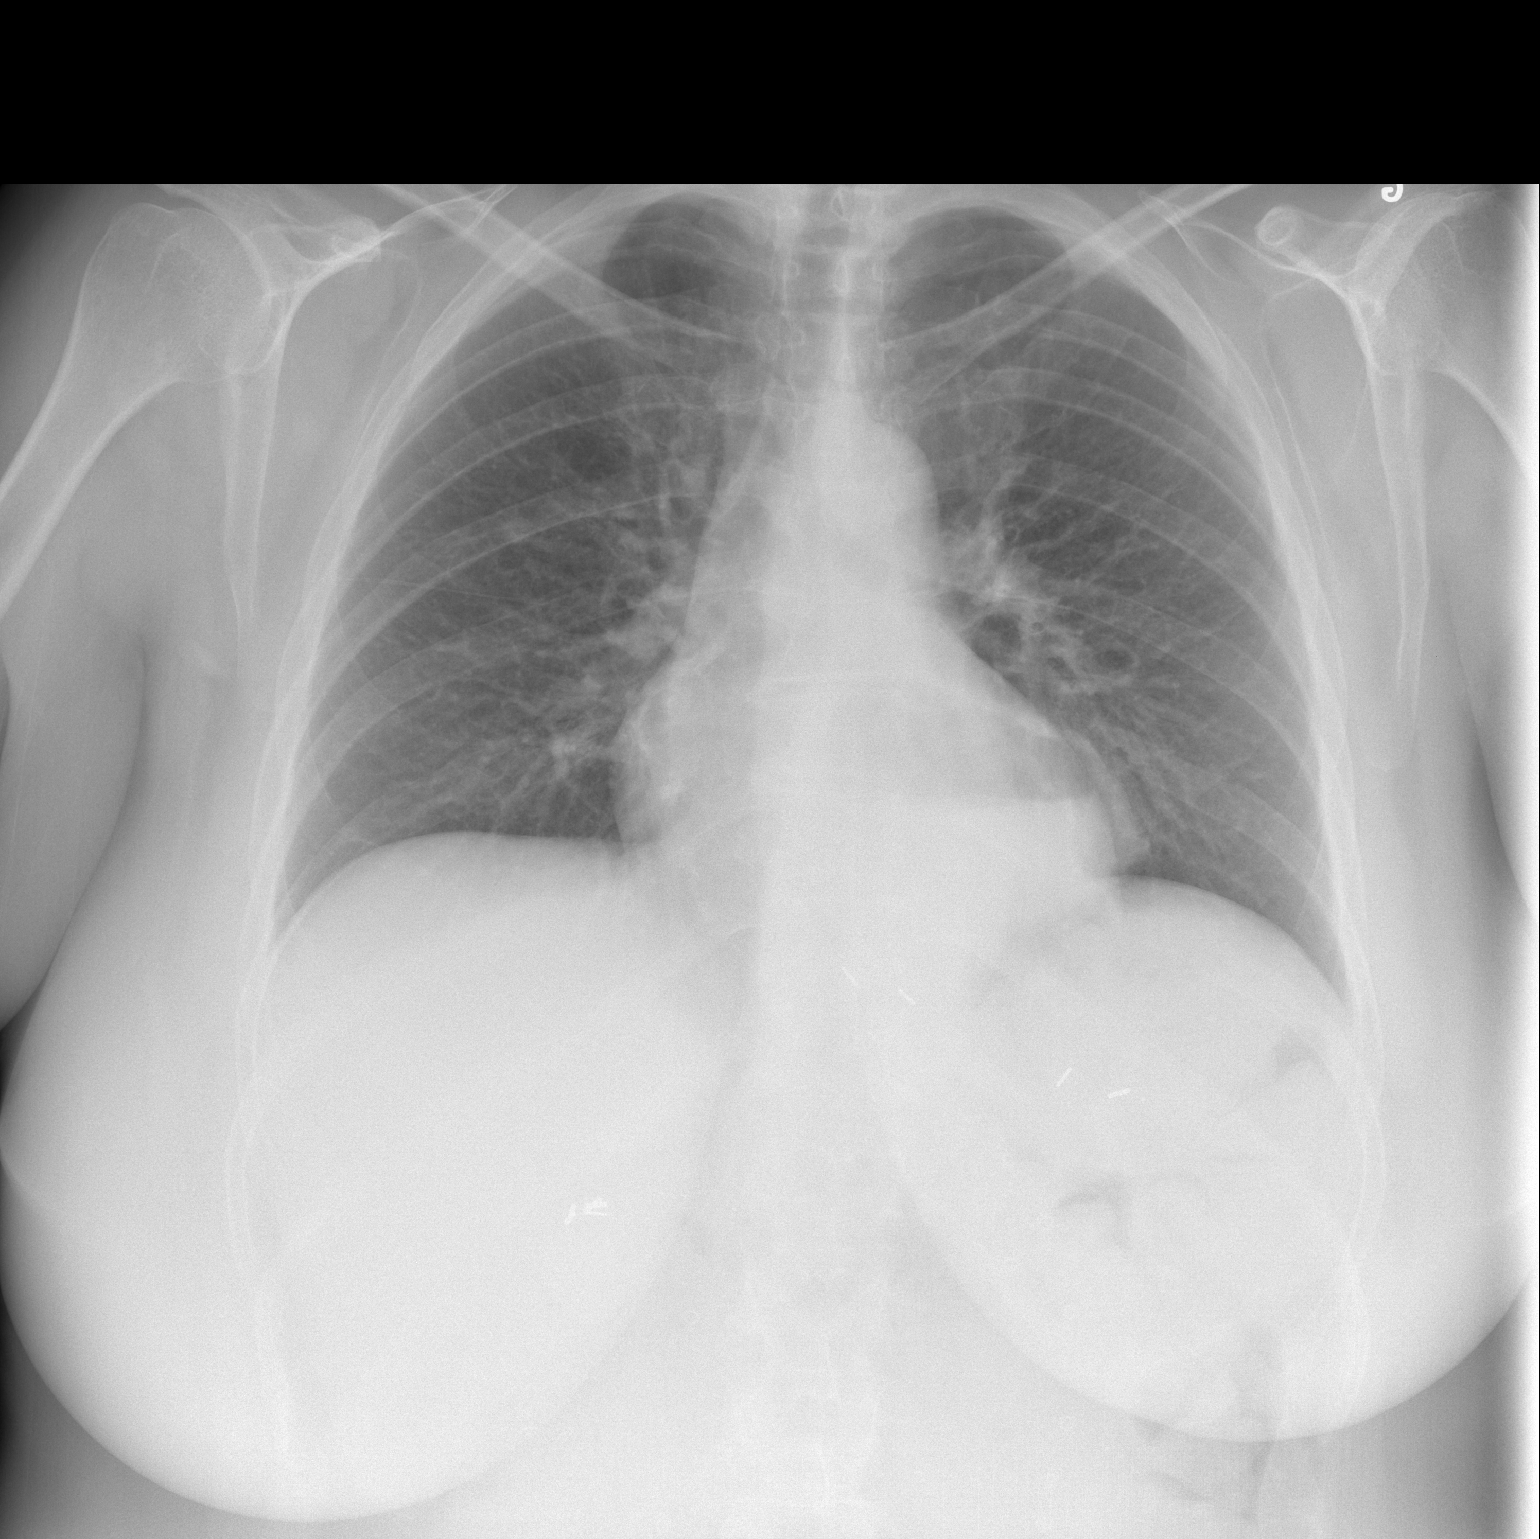

[w chest lat]
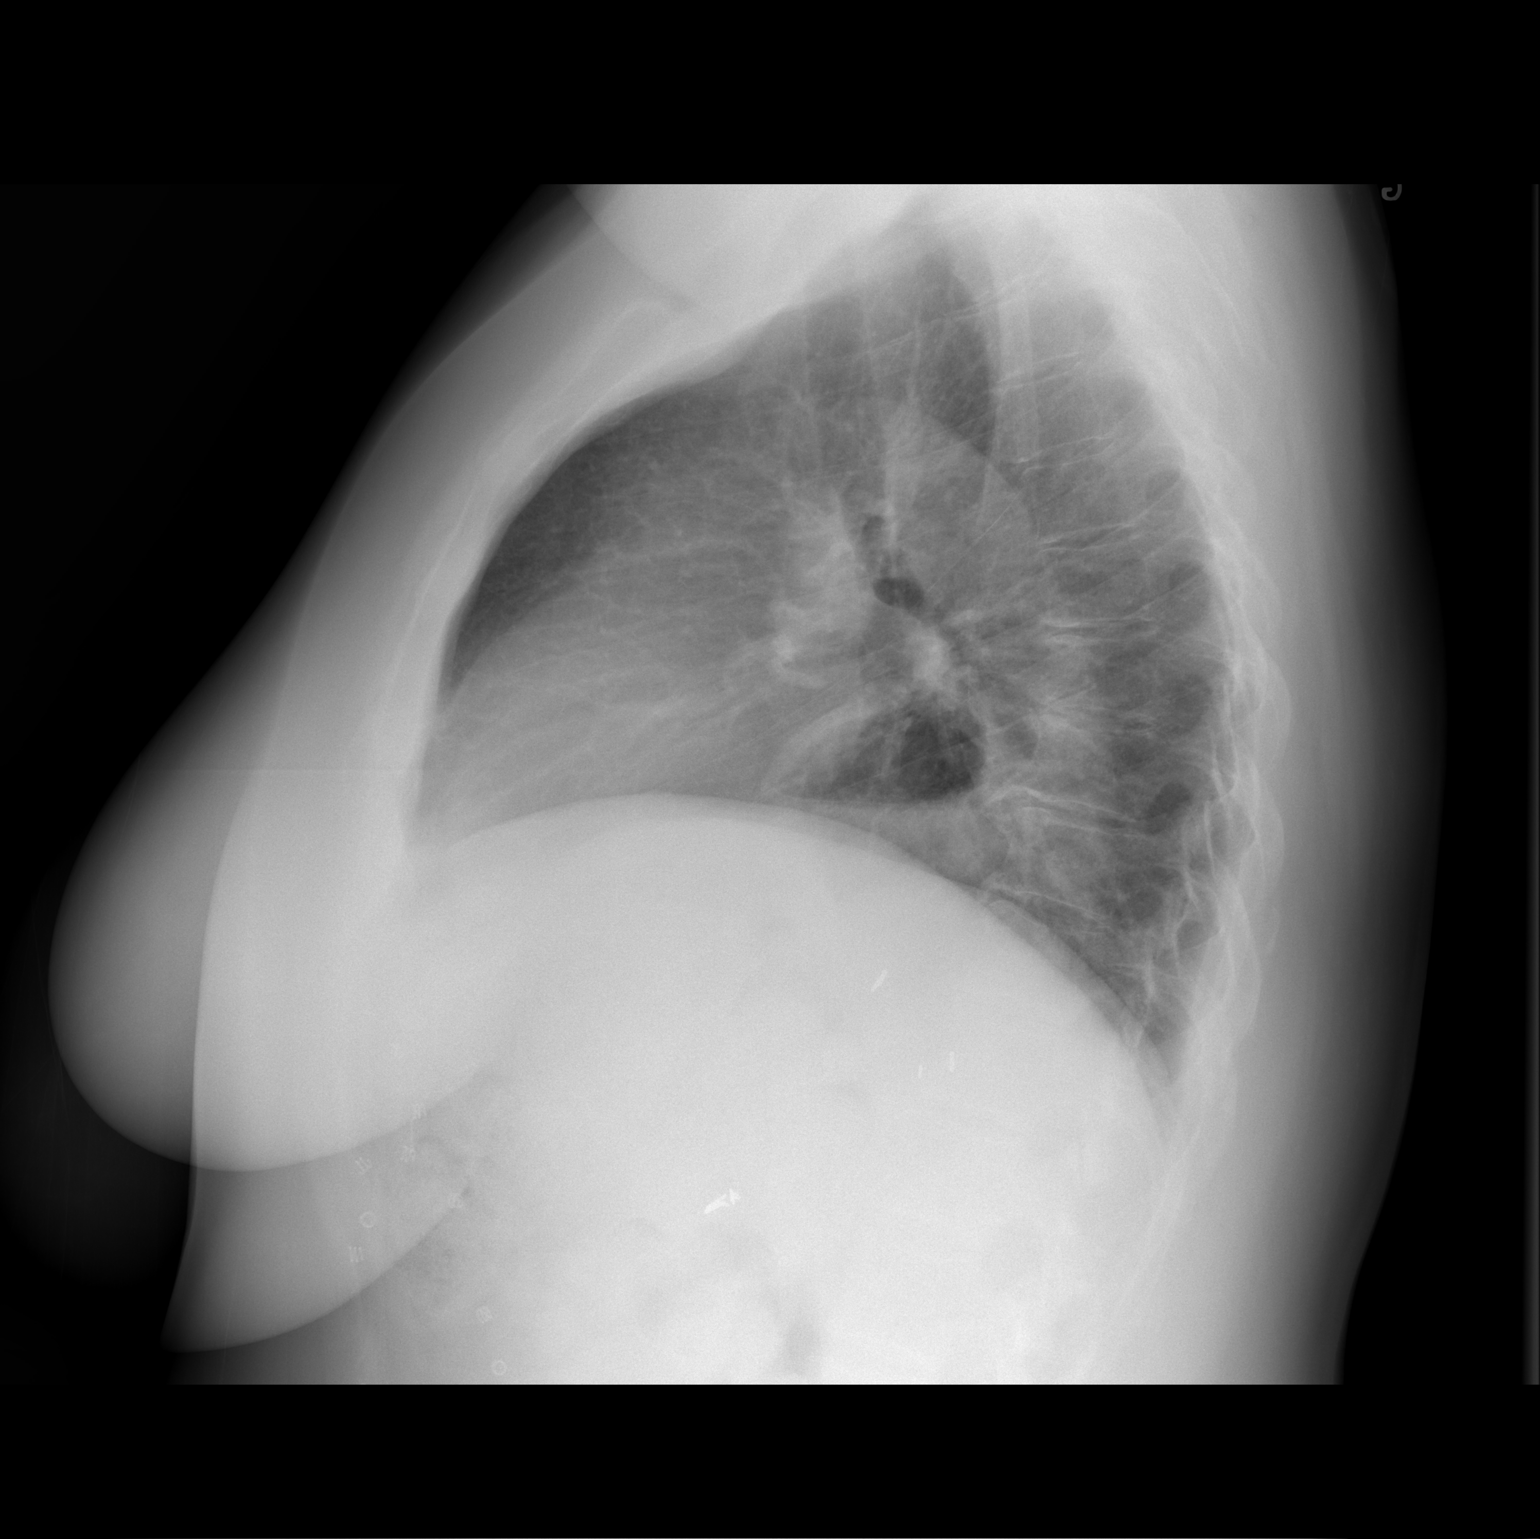

[2 of 2 positions shown; findings below may reference images not displayed]

FINDINGS: The lungs are clear. Heart size is normal. Hiatal hernia is noted.
Surgical clips are seen in the upper abdomen.
IMPRESSION: No acute disease.

Hiatal hernia.

## 2015-06-30 ENCOUNTER — Other Ambulatory Visit: Payer: Self-pay

## 2015-06-30 DIAGNOSIS — B029 Zoster without complications: Secondary | ICD-10-CM

## 2015-06-30 DIAGNOSIS — I10 Essential (primary) hypertension: Secondary | ICD-10-CM

## 2015-06-30 MED ORDER — FLUOXETINE HCL 40 MG PO CAPS: 40.0000 mg | ORAL_CAPSULE | Freq: Every day | ORAL | Status: DC

## 2015-06-30 MED ORDER — LISINOPRIL-HYDROCHLOROTHIAZIDE 20-25 MG PO TABS: ORAL_TABLET | ORAL | Status: DC

## 2015-06-30 MED ORDER — BUPROPION HCL ER (XL) 150 MG PO TB24: ORAL_TABLET | ORAL | Status: DC

## 2015-06-30 MED ORDER — VALACYCLOVIR HCL 1 G PO TABS: 1000.0000 mg | ORAL_TABLET | Freq: Three times a day (TID) | ORAL | Status: DC

## 2015-07-07 ENCOUNTER — Ambulatory Visit: Payer: Managed Care, Other (non HMO) | Admitting: Nutrition

## 2015-07-07 NOTE — Progress Notes (Signed)
55 year old female diagnosed with lymphoma.  She has completed treatment. Patient was referred for healthy diet information.  Past medical history includes hypertension, anxiety, esophageal reflux, Barrett's esophagus, anemia, asthma, depression, and hyperlipidemia.  Medications include Wellbutrin, vitamin D, Prozac, Prilosec, and vitamin B12.  Labs include potassium 3.4, and albumin 3.4 on May 4.  Height: 66 inches. Weight: 237.5 pounds. Usual body weight: 225 pounds. BMI: 38.35 (obese)  Patient presents to learn more about healthy diet after cancer.  Nutrition diagnosis:  Food and nutrition related knowledge deficit related to lymphoma as evidenced by no prior need for nutrition related information.  Intervention:  Patient educated to consume plant-based diet with adequate protein, adequate calories to maintain lean muscle mass. Reviewed importance of choosing low-fat foods and low fat cooking methods and controlling portion sizes to achieve weight loss. Provided multiple fact sheets.  Questions were answered.  Teach back method used.  Monitoring, evaluation, goals: Patient will tolerate adequate calories and protein to achieve slow safe weight loss.  Nutrition diagnosis resolved.  No follow-up required.  **Disclaimer: This note was dictated with voice recognition software. Similar sounding words can inadvertently be transcribed and this note may contain transcription errors which may not have been corrected upon publication of note.**

## 2015-07-23 ENCOUNTER — Ambulatory Visit (INDEPENDENT_AMBULATORY_CARE_PROVIDER_SITE_OTHER): Payer: Managed Care, Other (non HMO) | Admitting: Physician Assistant

## 2015-07-23 ENCOUNTER — Encounter: Payer: Self-pay | Admitting: Physician Assistant

## 2015-07-23 VITALS — BP 100/76 | HR 74 | Temp 98.3°F | Resp 16 | Ht 66.0 in | Wt 235.5 lb

## 2015-07-23 DIAGNOSIS — H01119 Allergic dermatitis of unspecified eye, unspecified eyelid: Secondary | ICD-10-CM

## 2015-07-23 NOTE — Patient Instructions (Signed)
Please go downstairs to check with our pharmacy for Opcon A allergy eye drops and generic Zyrtec. They should be cheaper downstairs.  Clean bed linens. Avoid makeup around the eyes while this is calming down.  Call or return to clinic if symptoms are not resolving.

## 2015-07-23 NOTE — Progress Notes (Signed)
Patient presents to clinic today c/o redness and itching of bilateral lower eyelids over the past few weeks. Symptoms are intermittent. Denies pain, drainage, swelling. Denies vision changes or red eyes. Does have history of allergies. Some mild eye watering but nothing major per patient. Denies change to makeup, soaps. Lotions, etc. Denies pet in the home or contact with similar symptoms.   Past Medical History  Diagnosis Date  . Hypertension   . Anxiety   . Esophageal reflux   . Anxiety state, unspecified   . Herpes simplex without mention of complication   . Unspecified essential hypertension   . Barrett esophagus   . Anemia   . Asthma     no problem with asthma in several years  . Unspecified asthma(493.90)   . Adenopathy     left axillary  . Lymphoma, T-cell ([place]) 03/20/2013  . Mental disorder   . Depression   . Headache 05/16/2013  . Skin infection 11/02/2013  . URI (upper respiratory infection) 01/15/2014  . Hyperlipidemia 03/12/2014  . Neuropathic pain 04/04/2014    Current Outpatient Prescriptions on File Prior to Visit  Medication Sig Dispense Refill  . aspirin 81 MG tablet Take 81 mg by mouth every other day.     Marland Kitchen buPROPion (WELLBUTRIN XL) 150 MG 24 hr tablet TAKE 1 TABLET (150 MG TOTAL) BY MOUTH DAILY. 90 tablet 0  . cholecalciferol (VITAMIN D) 1000 UNITS tablet Take 1,000 Units by mouth daily.    Marland Kitchen FLUoxetine (PROZAC) 40 MG capsule Take 1 capsule (40 mg total) by mouth daily. Office visit due now 90 capsule 1  . fluticasone (FLONASE) 50 MCG/ACT nasal spray Place 2 sprays into both nostrils daily. 16 g 6  . levocetirizine (XYZAL) 5 MG tablet Take 1 tablet (5 mg total) by mouth every evening. 30 tablet 5  . lisinopril-hydrochlorothiazide (PRINZIDE,ZESTORETIC) 20-25 MG tablet 1 tab by mouth daily-- 90 tablet 1  . omeprazole (PRILOSEC OTC) 20 MG tablet Take 1 tablet (20 mg total) by mouth daily as needed. 30 tablet 10  . valACYclovir (VALTREX) 1000 MG tablet Take 1 tablet  (1,000 mg total) by mouth 3 (three) times daily. (Patient taking differently: Take 1,000 mg by mouth 3 (three) times daily as needed. ) 90 tablet 1  . vitamin B-12 (CYANOCOBALAMIN) 1000 MCG tablet Take 1,000 mcg by mouth daily.     No current facility-administered medications on file prior to visit.    Allergies  Allergen Reactions  . Betadine [Povidone Iodine] Hives and Itching  . Codeine     REACTION: SICK-NAUSEATED    Family History  Problem Relation Age of Onset  . Coronary artery disease      with hernia repair  . Migraines    . Hypertension    . Breast cancer Maternal Grandmother   . Cancer Maternal Grandmother 88    breast  . Heart disease Mother   . Colon cancer Neg Hx     Social History   Social History  . Marital Status: Single    Spouse Name: N/A  . Number of Children: 1  . Years of Education: N/A   Occupational History  . RECEIVING CLERK    Social History Main Topics  . Smoking status: Never Smoker   . Smokeless tobacco: Never Used  . Alcohol Use: Yes     Comment: occasional  . Drug Use: No  . Sexual Activity:    Partners: Male   Other Topics Concern  . None  Social History Narrative   Exercise--  treadmill    Review of Systems - See HPI.  All other ROS are negative.  BP 100/76 mmHg  Pulse 74  Temp(Src) 98.3 F (36.8 C) (Oral)  Resp 16  Ht 5' 6" (1.676 m)  Wt 235 lb 8 oz (106.822 kg)  BMI 38.03 kg/m2  SpO2 98%  Physical Exam  Constitutional: She is oriented to person, place, and time.  HENT:  Head: Normocephalic and atraumatic.  Eyes: Conjunctivae are normal. Pupils are equal, round, and reactive to light. Lids are everted and swept, no foreign bodies found. Right eye exhibits no discharge, no exudate and no hordeolum. Left eye exhibits no discharge, no exudate and no hordeolum.  Lower eyelids with mild erythema without rash, swelling, drainage. Conjunctiva within normal limits. Vision preserved. PERRLA.  Neck: Neck supple.    Cardiovascular: Normal rate, regular rhythm, normal heart sounds and intact distal pulses.   Pulmonary/Chest: Effort normal and breath sounds normal. No respiratory distress. She has no wheezes. She has no rales. She exhibits no tenderness.  Lymphadenopathy:    She has no cervical adenopathy.  Neurological: She is alert and oriented to person, place, and time.  Skin: Skin is warm and dry. No rash noted.  Psychiatric: Affect normal.  Vitals reviewed.   Recent Results (from the past 2160 hour(s))  Comprehensive metabolic panel     Status: Abnormal   Collection Time: 05/20/15 11:01 AM  Result Value Ref Range   Sodium 137 135 - 145 mEq/L   Potassium 3.3 (L) 3.5 - 5.1 mEq/L   Chloride 99 96 - 112 mEq/L   CO2 32 19 - 32 mEq/L   Glucose, Bld 93 70 - 99 mg/dL   BUN 19 6 - 23 mg/dL   Creatinine, Ser 0.78 0.40 - 1.20 mg/dL   Total Bilirubin 0.7 0.2 - 1.2 mg/dL   Alkaline Phosphatase 144 (H) 39 - 117 U/L   AST 14 0 - 37 U/L   ALT 15 0 - 35 U/L   Total Protein 7.1 6.0 - 8.3 g/dL   Albumin 4.1 3.5 - 5.2 g/dL   Calcium 9.7 8.4 - 10.5 mg/dL   GFR 82.27 >60.00 mL/min  Lipid panel     Status: Abnormal   Collection Time: 05/20/15 11:01 AM  Result Value Ref Range   Cholesterol 201 (H) 0 - 200 mg/dL    Comment: ATP III Classification       Desirable:  < 200 mg/dL               Borderline High:  200 - 239 mg/dL          High:  > = 240 mg/dL   Triglycerides 83.0 0.0 - 149.0 mg/dL    Comment: Normal:  <150 mg/dLBorderline High:  150 - 199 mg/dL   HDL 73.10 >39.00 mg/dL   VLDL 16.6 0.0 - 40.0 mg/dL   LDL Cholesterol 112 (H) 0 - 99 mg/dL   Total CHOL/HDL Ratio 3     Comment:                Men          Women1/2 Average Risk     3.4          3.3Average Risk          5.0          4.42X Average Risk          9.6  7.13X Average Risk          15.0          11.0                       NonHDL 128.17     Comment: NOTE:  Non-HDL goal should be 30 mg/dL higher than patient's LDL goal (i.e. LDL goal  of < 70 mg/dL, would have non-HDL goal of < 100 mg/dL)  CBC with Differential/Platelet     Status: None   Collection Time: 06/19/15  8:30 AM  Result Value Ref Range   WBC 5.3 3.9 - 10.3 10e3/uL   NEUT# 3.6 1.5 - 6.5 10e3/uL   HGB 12.9 11.6 - 15.9 g/dL   HCT 40.1 34.8 - 46.6 %   Platelets 268 145 - 400 10e3/uL   MCV 86.8 79.5 - 101.0 fL   MCH 27.9 25.1 - 34.0 pg   MCHC 32.1 31.5 - 36.0 g/dL   RBC 4.63 3.70 - 5.45 10e6/uL   RDW 14.1 11.2 - 14.5 %   lymph# 1.1 0.9 - 3.3 10e3/uL   MONO# 0.5 0.1 - 0.9 10e3/uL   Eosinophils Absolute 0.1 0.0 - 0.5 10e3/uL   Basophils Absolute 0.1 0.0 - 0.1 10e3/uL   NEUT% 67.3 38.4 - 76.8 %   LYMPH% 21.4 14.0 - 49.7 %   MONO% 8.8 0.0 - 14.0 %   EOS% 1.5 0.0 - 7.0 %   BASO% 1.0 0.0 - 2.0 %  Lactate dehydrogenase     Status: None   Collection Time: 06/19/15  8:30 AM  Result Value Ref Range   LDH 178 125 - 245 U/L  Comprehensive metabolic panel     Status: Abnormal   Collection Time: 06/19/15  8:30 AM  Result Value Ref Range   Sodium 140 136 - 145 mEq/L   Potassium 3.4 (L) 3.5 - 5.1 mEq/L   Chloride 104 98 - 109 mEq/L   CO2 28 22 - 29 mEq/L   Glucose 91 70 - 140 mg/dl    Comment: Glucose reference range is for nonfasting patients. Fasting glucose reference range is 70- 100.   BUN 10.0 7.0 - 26.0 mg/dL   Creatinine 0.9 0.6 - 1.1 mg/dL   Total Bilirubin 0.53 0.20 - 1.20 mg/dL   Alkaline Phosphatase 139 40 - 150 U/L   AST 21 5 - 34 U/L   ALT 34 0 - 55 U/L   Total Protein 6.6 6.4 - 8.3 g/dL   Albumin 3.4 (L) 3.5 - 5.0 g/dL   Calcium 9.5 8.4 - 10.4 mg/dL   Anion Gap 8 3 - 11 mEq/L   EGFR 77 (L) >90 ml/min/1.73 m2    Comment: eGFR is calculated using the CKD-EPI Creatinine Equation (2009)    Assessment/Plan: 1. Allergic blepharitis, unspecified laterality Mild. Intermittent. Discussed potential triggers. Start daily Claritin. Opcon A allergy drops as directed. Supportive measures reviewed. FU if not resolving.    Leeanne Rio, PA-C

## 2015-08-14 ENCOUNTER — Encounter: Payer: Self-pay | Admitting: Family Medicine

## 2015-08-14 NOTE — Telephone Encounter (Signed)
She would need ov 

## 2015-08-20 ENCOUNTER — Ambulatory Visit (INDEPENDENT_AMBULATORY_CARE_PROVIDER_SITE_OTHER): Payer: Managed Care, Other (non HMO) | Admitting: Family

## 2015-08-20 ENCOUNTER — Other Ambulatory Visit: Payer: Self-pay | Admitting: Family

## 2015-08-20 ENCOUNTER — Encounter: Payer: Self-pay | Admitting: Family

## 2015-08-20 VITALS — BP 100/80 | HR 82 | Temp 98.3°F | Resp 16 | Ht 66.0 in | Wt 232.0 lb

## 2015-08-20 DIAGNOSIS — N3001 Acute cystitis with hematuria: Secondary | ICD-10-CM

## 2015-08-20 DIAGNOSIS — R3915 Urgency of urination: Secondary | ICD-10-CM | POA: Diagnosis not present

## 2015-08-20 LAB — POCT URINALYSIS DIPSTICK
BILIRUBIN UA: NEGATIVE
Glucose, UA: NEGATIVE
KETONES UA: NEGATIVE
Nitrite, UA: POSITIVE
PH UA: 6
SPEC GRAV UA: 1.02
Urobilinogen, UA: 0.2

## 2015-08-20 MED ORDER — NITROFURANTOIN MONOHYD MACRO 100 MG PO CAPS: 100.0000 mg | ORAL_CAPSULE | Freq: Two times a day (BID) | ORAL | Status: DC

## 2015-08-20 MED FILL — NITROFURANTOIN MONO-MCR 100: 100 | 7 days supply | Qty: 14 | Fill #0

## 2015-08-20 NOTE — Addendum Note (Signed)
Addended by: Harl Bowie on: 08/20/2015 10:03 AM   Modules accepted: Miquel Dunn

## 2015-08-20 NOTE — Progress Notes (Signed)
Subjective:    Patient ID: Anna Jackson, female    DOB: 1960/12/07, 55 y.o.   MRN: [id number]  HPI  Anna Jackson is a 55 yr old female who presents today with chief complaint of urinary urgency and urinary incontinence.(dribbling of urine) Reports associated pelvic pain (all day yesterday) and bilateral low back pain which began this AM.  Noted some blood on toilet tissue after urinating. Denies associated fever, nausea, vomiting. Reports that she recently became sexually active again for the first time in 4 years.   Review of Systems See HPI  Past Medical History  Diagnosis Date  . Hypertension   . Anxiety   . Esophageal reflux   . Anxiety state, unspecified   . Herpes simplex without mention of complication   . Unspecified essential hypertension   . Barrett esophagus   . Anemia   . Asthma     no problem with asthma in several years  . Unspecified asthma(493.90)   . Adenopathy     left axillary  . 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 Mental disorder   . Depression   . Headache 05/16/2013  . Skin infection 11/02/2013  . URI (upper respiratory infection) 01/15/2014  . Hyperlipidemia 03/12/2014  . Neuropathic pain 04/04/2014     Social History   Social History  . Marital Status: Single    Spouse Name: N/A  . Number of Children: 1  . Years of Education: N/A   Occupational History  . RECEIVING CLERK    Social History Main Topics  . Smoking status: Never Smoker   . Smokeless tobacco: Never Used  . Alcohol Use: Yes     Comment: occasional  . Drug Use: No  . Sexual Activity:    Partners: Male   Other Topics Concern  . Not on file   Social History Narrative   Exercise--  treadmill    Past Surgical History  Procedure Laterality Date  . Cholecystectomy      with hernia repair  . Abdominal hysterectomy  2006  . Nissen fundoplication    . Cervical conization w/bx    . Tubal ligation    . Knee surgery  02/2010    Right--arthroscopic  . Axillary lymph  node dissection Left 03/09/2013    Procedure: EXCISION LEFT AXILLARY LYMPH NODES;  Surgeon: [name], MD;  Location: [facility];  Service: General;  Laterality: Left;  . Portacath placement N/A 03/23/2013    Procedure: INSERTION PORT-A-CATH;  Surgeon: [name], MD;  Location: [facility];  Service: General;  Laterality: N/A;    Family History  Problem Relation Age of Onset  . Coronary artery disease      with hernia repair  . Migraines    . Hypertension    . Breast cancer Maternal Grandmother   . Cancer Maternal Grandmother 56    breast  . Heart disease Mother   . Colon cancer Neg Hx     Allergies  Allergen Reactions  . Betadine [Povidone Iodine] Hives and Itching  . Codeine     REACTION: SICK-NAUSEATED    Current Outpatient Prescriptions on File Prior to Visit  Medication Sig Dispense Refill  . aspirin 81 MG tablet Take 81 mg by mouth every other day.     Marland Kitchen buPROPion (WELLBUTRIN XL) 150 MG 24 hr tablet TAKE 1 TABLET (150 MG TOTAL) BY MOUTH DAILY. 90 tablet 0  . cholecalciferol (VITAMIN D) 1000 UNITS tablet Take 1,000 Units by mouth daily.    Marland Kitchen  FLUoxetine (PROZAC) 40 MG capsule Take 1 capsule (40 mg total) by mouth daily. Office visit due now 90 capsule 1  . fluticasone (FLONASE) 50 MCG/ACT nasal spray Place 2 sprays into both nostrils daily. (Patient taking differently: Place 2 sprays into both nostrils daily as needed. ) 16 g 6  . levocetirizine (XYZAL) 5 MG tablet Take 1 tablet (5 mg total) by mouth every evening. (Patient taking differently: Take 5 mg by mouth daily as needed. ) 30 tablet 5  . lisinopril-hydrochlorothiazide (PRINZIDE,ZESTORETIC) 20-25 MG tablet 1 tab by mouth daily-- 90 tablet 1  . omeprazole (PRILOSEC OTC) 20 MG tablet Take 1 tablet (20 mg total) by mouth daily as needed. 30 tablet 10  . valACYclovir (VALTREX) 1000 MG tablet Take 1 tablet (1,000 mg total) by mouth 3 (three) times daily. (Patient taking differently: Take 1,000 mg by mouth 3 (three) times  daily as needed. ) 90 tablet 1  . vitamin B-12 (CYANOCOBALAMIN) 1000 MCG tablet Take 1,000 mcg by mouth daily.     No current facility-administered medications on file prior to visit.    BP 100/80 mmHg  Pulse 82  Temp(Src) 98.3 F (36.8 C) (Oral)  Resp 16  Ht 5\' 6"  (1.676 m)  Wt 232 lb (105.235 kg)  BMI 37.46 kg/m2  SpO2 98%       Objective:   Physical Exam  Constitutional: She appears well-developed and well-nourished.  Cardiovascular: Normal rate, regular rhythm and normal heart sounds.   No murmur heard. Pulmonary/Chest: Effort normal and breath sounds normal. No respiratory distress. She has no wheezes.  Abdominal: Soft. Bowel sounds are normal. She exhibits no distension. There is tenderness in the suprapubic area.  Genitourinary:  Neg CVAT bilaterally  Psychiatric: She has a normal mood and affect. Her behavior is normal. Judgment and thought content normal.          Assessment & Plan:  UTI- UA consistent with UTI. Will send urine for culture as well as ancillary testing (GC/Chlamydia). She is advised to begin macrobid and to call if her symptoms worsen or do not improve.

## 2015-08-20 NOTE — Progress Notes (Signed)
Pre visit review using our clinic review tool, if applicable. No additional management support is needed unless otherwise documented below in the visit note. 

## 2015-08-20 NOTE — Patient Instructions (Signed)
Please begin macrobid (antibiotic) for urinary tract infection. Call if symptoms worsen, if you develop fever, or if symptoms are not improved in 2-3 days.

## 2015-08-20 NOTE — Addendum Note (Signed)
Addended by: Kelle Darting A on: 08/20/2015 09:51 AM   Modules accepted: Orders, SmartSet

## 2015-08-21 ENCOUNTER — Encounter: Payer: Self-pay | Admitting: Family

## 2015-08-21 LAB — GC/CHLAMYDIA PROBE AMP
CT PROBE, AMP APTIMA: NOT DETECTED
GC PROBE AMP APTIMA: NOT DETECTED

## 2015-08-22 ENCOUNTER — Encounter: Payer: Self-pay | Admitting: Family

## 2015-08-22 LAB — URINE CULTURE

## 2015-09-22 ENCOUNTER — Encounter: Payer: Self-pay | Admitting: Medical

## 2015-09-22 ENCOUNTER — Ambulatory Visit (HOSPITAL_BASED_OUTPATIENT_CLINIC_OR_DEPARTMENT_OTHER)
Admission: RE | Admit: 2015-09-22 | Discharge: 2015-09-22 | Disposition: A | Discharge status: Home / Self Care | Payer: Managed Care, Other (non HMO) | Source: Ambulatory Visit | Attending: Medical | Admitting: Medical

## 2015-09-22 ENCOUNTER — Ambulatory Visit (INDEPENDENT_AMBULATORY_CARE_PROVIDER_SITE_OTHER): Payer: Managed Care, Other (non HMO) | Admitting: Medical

## 2015-09-22 VITALS — BP 104/70 | HR 86 | Temp 98.3°F | Resp 16 | Ht 66.0 in | Wt 233.4 lb

## 2015-09-22 DIAGNOSIS — M79661 Pain in right lower leg: Secondary | ICD-10-CM | POA: Insufficient documentation

## 2015-09-22 DIAGNOSIS — M1712 Unilateral primary osteoarthritis, left knee: Secondary | ICD-10-CM | POA: Insufficient documentation

## 2015-09-22 DIAGNOSIS — M25562 Pain in left knee: Secondary | ICD-10-CM | POA: Insufficient documentation

## 2015-09-22 MED ORDER — HYDROCODONE-ACETAMINOPHEN 5-325 MG PO TABS: 1.0000 | ORAL_TABLET | Freq: Four times a day (QID) | ORAL | 0 refills | Status: DC | PRN

## 2015-09-22 NOTE — Progress Notes (Signed)
Subjective:    Patient ID: Anna Jackson, female    DOB: 12/12/1960, 55 y.o.   MRN: [id number]  HPI  Pt slipped and fell yesterday at [employer]. She hit her left knee on floor. rt shin area hit buggy.  Pt left knee has high level. Pain level is at least 7/10.  Pt states some nausea on off all day. Seems to correlate with high level of pain.  No vomiting. No diarrhea.  Pt has used hydrocodone in past and could tolerate no side effects.   Review of Systems  Constitutional: Negative for chills and fatigue.  Respiratory: Negative for cough, shortness of breath and wheezing.   Cardiovascular: Negative for chest pain and palpitations.  Musculoskeletal:       Knee and tibia pain. See hpi.   Past Medical History:  Diagnosis Date  . Adenopathy    left axillary  . Anemia   . Anxiety   . Anxiety state, unspecified   . Asthma    no problem with asthma in several years  . Barrett esophagus   . Depression   . Esophageal reflux   . Headache 05/16/2013  . Herpes simplex without mention of complication   . Hyperlipidemia 03/12/2014  . Hypertension   . 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 Mental disorder   . Neuropathic pain 04/04/2014  . Skin infection 11/02/2013  . Unspecified asthma(493.90)   . Unspecified essential hypertension   . URI (upper respiratory infection) 01/15/2014     Social History   Social History  . Marital status: Single    Spouse name: N/A  . Number of children: 1  . Years of education: N/A   Occupational History  . [employer]   Social History Main Topics  . Smoking status: Never Smoker  . Smokeless tobacco: Never Used  . Alcohol use Yes     Comment: occasional  . Drug use: No  . Sexual activity: Not Currently    Partners: Male   Other Topics Concern  . Not on file   Social History Narrative   Exercise--  treadmill    Past Surgical History:  Procedure Laterality Date  . ABDOMINAL HYSTERECTOMY  2006  . AXILLARY LYMPH  NODE DISSECTION Left 03/09/2013   Procedure: EXCISION LEFT AXILLARY LYMPH NODES;  Surgeon: [name], MD;  Location: [facility];  Service: General;  Laterality: Left;  . CERVICAL CONIZATION W/BX    . CHOLECYSTECTOMY     with hernia repair  . KNEE SURGERY  02/2010   Right--arthroscopic  . NISSEN FUNDOPLICATION    . PORTACATH PLACEMENT N/A 03/23/2013   Procedure: INSERTION PORT-A-CATH;  Surgeon: [name], MD;  Location: [place];  Service: General;  Laterality: N/A;  . TUBAL LIGATION      Family History  Problem Relation Age of Onset  . Coronary artery disease      with hernia repair  . Migraines    . Hypertension    . Breast cancer Maternal Grandmother   . Cancer Maternal Grandmother 65    breast  . Heart disease Mother   . Colon cancer Neg Hx     Allergies  Allergen Reactions  . Betadine [Povidone Iodine] Hives and Itching  . Codeine     REACTION: SICK-NAUSEATED    Current Outpatient Prescriptions on File Prior to Visit  Medication Sig Dispense Refill  . aspirin 81 MG tablet Take 81 mg by mouth every other day.     Marland Kitchen  buPROPion (WELLBUTRIN XL) 150 MG 24 hr tablet TAKE 1 TABLET (150 MG TOTAL) BY MOUTH DAILY. 90 tablet 0  . cholecalciferol (VITAMIN D) 1000 UNITS tablet Take 1,000 Units by mouth daily.    Marland Kitchen FLUoxetine (PROZAC) 40 MG capsule Take 1 capsule (40 mg total) by mouth daily. Office visit due now 90 capsule 1  . fluticasone (FLONASE) 50 MCG/ACT nasal spray Place 2 sprays into both nostrils daily. (Patient taking differently: Place 2 sprays into both nostrils daily as needed. ) 16 g 6  . levocetirizine (XYZAL) 5 MG tablet Take 1 tablet (5 mg total) by mouth every evening. (Patient taking differently: Take 5 mg by mouth daily as needed. ) 30 tablet 5  . lisinopril-hydrochlorothiazide (PRINZIDE,ZESTORETIC) 20-25 MG tablet 1 tab by mouth daily-- 90 tablet 1  . nitrofurantoin, macrocrystal-monohydrate, (MACROBID) 100 MG capsule Take 1 capsule (100 mg total) by mouth 2  (two) times daily. 14 capsule 0  . omeprazole (PRILOSEC OTC) 20 MG tablet Take 1 tablet (20 mg total) by mouth daily as needed. 30 tablet 10  . valACYclovir (VALTREX) 1000 MG tablet Take 1 tablet (1,000 mg total) by mouth 3 (three) times daily. (Patient taking differently: Take 1,000 mg by mouth 3 (three) times daily as needed. ) 90 tablet 1  . vitamin B-12 (CYANOCOBALAMIN) 1000 MCG tablet Take 1,000 mcg by mouth daily.     No current facility-administered medications on file prior to visit.     BP 104/70 (BP Location: Right Arm, Patient Position: Sitting, Cuff Size: Large)   Pulse 86   Temp 98.3 F (36.8 C) (Oral)   Resp 16   Ht 5\' 6"  (1.676 m)   Wt 233 lb 6.4 oz (105.9 kg)   SpO2 98%   BMI 37.67 kg/m       Objective:   Physical Exam  General- No acute distress. Pleasant patient. Lungs- Clear, even and unlabored. Heart- regular rate and rhythm. Neurologic- CNII- XII grossly intact.  Rt lower ext- pain on palpation mid tibia. No bruising. Rt knee from no pain.  Lt knee- no instability. No tibial plateau pain. No crepitus. Direct pain on palpation moderate to severe over patella.        Assessment & Plan:  For your left knee pain and lt shin/tibia pain will get stat xrays.  We will call you with results by tomorrow.  If xray negative but pain persisting past 7 days then consider sports medicine referral or ortho   For pain use ibuprofen otc. For severe pain rx norco.  Follow up in 7-10 days or as needed.  Ghina Bittinger, Percell Miller, PA-C

## 2015-09-22 NOTE — Patient Instructions (Addendum)
For your left knee pain and lt shin/tibia pain will get stat xrays.  We will call you with results by tomorrow.  If xray negative but pain persisting past 7 days then consider sports medicine referral or ortho   For pain use ibuprofen otc. For severe pain rx norco.  Follow up in 7-10 days or as needed.

## 2015-09-22 NOTE — Progress Notes (Signed)
Pre visit review using our clinic review tool, if applicable. No additional management support is needed unless otherwise documented below in the visit note./HSM  

## 2015-09-23 NOTE — Progress Notes (Signed)
Pt has seen results on MyChart and message also sent for patient to call back if any questions.

## 2015-11-13 ENCOUNTER — Other Ambulatory Visit: Payer: Self-pay | Admitting: Family Medicine

## 2015-12-22 ENCOUNTER — Encounter: Payer: Managed Care, Other (non HMO) | Admitting: Nutrition

## 2015-12-22 ENCOUNTER — Ambulatory Visit (HOSPITAL_BASED_OUTPATIENT_CLINIC_OR_DEPARTMENT_OTHER): Payer: Managed Care, Other (non HMO) | Admitting: Adult Health

## 2015-12-22 ENCOUNTER — Other Ambulatory Visit (HOSPITAL_BASED_OUTPATIENT_CLINIC_OR_DEPARTMENT_OTHER): Payer: Managed Care, Other (non HMO)

## 2015-12-22 ENCOUNTER — Ambulatory Visit: Payer: Managed Care, Other (non HMO) | Admitting: Adult Health

## 2015-12-22 ENCOUNTER — Encounter: Payer: Self-pay | Admitting: Adult Health

## 2015-12-22 VITALS — BP 125/77 | HR 96 | Temp 98.3°F | Resp 18 | Wt 247.3 lb

## 2015-12-22 DIAGNOSIS — E785 Hyperlipidemia, unspecified: Secondary | ICD-10-CM

## 2015-12-22 DIAGNOSIS — M79622 Pain in left upper arm: Secondary | ICD-10-CM | POA: Diagnosis not present

## 2015-12-22 DIAGNOSIS — Z8579 Personal history of other malignant neoplasms of lymphoid, hematopoietic and related tissues: Secondary | ICD-10-CM

## 2015-12-22 DIAGNOSIS — M25512 Pain in left shoulder: Secondary | ICD-10-CM

## 2015-12-22 DIAGNOSIS — C8590 Non-Hodgkin lymphoma, unspecified, unspecified site: Secondary | ICD-10-CM

## 2015-12-22 DIAGNOSIS — Z1231 Encounter for screening mammogram for malignant neoplasm of breast: Secondary | ICD-10-CM

## 2015-12-22 LAB — COMPREHENSIVE METABOLIC PANEL
ALBUMIN: 3.2 g/dL — AB (ref 3.5–5.0)
ALK PHOS: 152 U/L — AB (ref 40–150)
ALT: 14 U/L (ref 0–55)
AST: 13 U/L (ref 5–34)
Anion Gap: 9 mEq/L (ref 3–11)
BUN: 16.3 mg/dL (ref 7.0–26.0)
CO2: 28 mEq/L (ref 22–29)
CREATININE: 0.8 mg/dL (ref 0.6–1.1)
Calcium: 9.4 mg/dL (ref 8.4–10.4)
Chloride: 104 mEq/L (ref 98–109)
EGFR: 88 mL/min/{1.73_m2} — ABNORMAL LOW (ref >90)
GLUCOSE: 83 mg/dL (ref 70–140)
POTASSIUM: 4 meq/L (ref 3.5–5.1)
SODIUM: 140 meq/L (ref 136–145)
TOTAL PROTEIN: 6.8 g/dL (ref 6.4–8.3)
Total Bilirubin: 0.41 mg/dL (ref 0.20–1.20)

## 2015-12-22 LAB — CBC WITH DIFFERENTIAL/PLATELET
BASO%: 0.5 % (ref 0.0–2.0)
BASOS ABS: 0 10*3/uL (ref 0.0–0.1)
EOS ABS: 0.1 10*3/uL (ref 0.0–0.5)
EOS%: 1.7 % (ref 0.0–7.0)
HEMATOCRIT: 36.1 % (ref 34.8–46.6)
HEMOGLOBIN: 11.6 g/dL (ref 11.6–15.9)
LYMPH#: 1.2 10*3/uL (ref 0.9–3.3)
LYMPH%: 20 % (ref 14.0–49.7)
MCH: 27.2 pg (ref 25.1–34.0)
MCHC: 32.1 g/dL (ref 31.5–36.0)
MCV: 84.8 fL (ref 79.5–101.0)
MONO#: 0.5 10*3/uL (ref 0.1–0.9)
MONO%: 8.9 % (ref 0.0–14.0)
NEUT#: 4 10*3/uL (ref 1.5–6.5)
NEUT%: 68.9 % (ref 38.4–76.8)
PLATELETS: 326 10*3/uL (ref 145–400)
RBC: 4.25 10*6/uL (ref 3.70–5.45)
RDW: 14.7 % — AB (ref 11.2–14.5)
WBC: 5.9 10*3/uL (ref 3.9–10.3)

## 2015-12-22 LAB — LACTATE DEHYDROGENASE: LDH: 179 U/L (ref 125–245)

## 2015-12-22 NOTE — Progress Notes (Signed)
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   BRIEF ONCOLOGIC HISTORY:  Oncology History   Anaplastic Lymphoma, T-cell, 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)   Staging method: AJCC 7th Edition   Clinical: Stage II signed by Heath Lark, MD on 04/03/2013  3:31 PM   Summary: Stage II      History of lymphoma   02/07/2013 Imaging    [place] confirmed enlarged LN      03/09/2013 Procedure    LN biopsy confirmed T cell lymphoma      03/23/2013 Procedure    She has port placement      03/26/2013 Imaging    Echo showed normal ejection fraction      03/29/2013 Bone Marrow Biopsy    Bone marrow biopsy was negative      04/02/2013 Imaging    PET/CT scan showed localized, stage II disease      04/04/2013 - 07/20/2013 Chemotherapy    She has completed 6 cycles of CHOP plus etoposide chemotherapy.      06/01/2013 Imaging    PET CT scan show complete response to treatment      06/27/2013 Adverse Reaction    Dose of vincristine was reduced due to peripheral neuropathy.      08/31/2013 Imaging    Repeat PET scans show no evidence of disease.      06/10/2014 Imaging    CT chest showed no evidence of recurrence       INTERVAL HISTORY:  Anna Jackson presents to the [facility] today for routine follow-up for her history of T-cell lymphoma.   Largely, her only complaint today is left arm/shoulder pain.  It began about 1 month ago and is worsening/becoming more constant.  She denies any recent trauma or new physical activities.  It is difficult for her to sleep on her left side because she is uncomfortable.  She expresses concern as the left axilla was where her cancer was originally diagnosed (and she subsequently had lymph node excision per her report).   Otherwise, she reports feeling well.  She denies any fever, chills, unintended weight loss, or night sweats.  Her energy levels and appetite  are good.  She Tells me that she recently got a Physiological scientist, and will start working out with them this week. She is looking forward to being more physically active.   She tells me that she does see her PCP regularly, but is wondering how she will know if she has gone through menopause since she has a history of hysterectomy.    REVIEW OF SYSTEMS:  Review of Systems  Constitutional: Negative for chills, fatigue, fever and unexpected weight change.  HENT:  Negative.   Eyes: Negative.   Respiratory: Negative.   Cardiovascular: Negative.   Gastrointestinal: Negative.   Genitourinary: Negative for vaginal bleeding.   Musculoskeletal:       Left upper arm/shoulder pain; "achy and sore"  Skin: Negative.   Neurological: Negative.   Hematological: Bruises/bleeds easily.       Takes an aspirin daily and reports bruising easily.   Psychiatric/Behavioral: Negative.   Breast: Denies any new nodularity, masses, tenderness, nipple changes, or nipple discharge.    A 14-point review of systems was completed and was negative, except as noted above.    PAST MEDICAL/SURGICAL HISTORY:  Past Medical History:  Diagnosis Date  . Adenopathy    left axillary  . Anemia   .  Anxiety   . Anxiety state, unspecified   . Asthma    no problem with asthma in several years  . Barrett esophagus   . Depression   . Esophageal reflux   . Headache 05/16/2013  . Herpes simplex without mention of complication   . Hyperlipidemia 03/12/2014  . Hypertension   . Lymphoma, 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  . Mental disorder   . Neuropathic pain 04/04/2014  . Skin infection 11/02/2013  . Unspecified asthma(493.90)   . Unspecified essential hypertension   . URI (upper respiratory infection) 01/15/2014   Past Surgical History:  Procedure Laterality Date  . ABDOMINAL HYSTERECTOMY  2006  . AXILLARY LYMPH NODE DISSECTION Left 03/09/2013   Procedure: EXCISION LEFT AXILLARY LYMPH NODES;  Surgeon: Haywood M Ingram, MD;   Location: [facility];  Service: General;  Laterality: Left;  . CERVICAL CONIZATION W/BX    . CHOLECYSTECTOMY     with hernia repair  . KNEE SURGERY  02/2010   Right--arthroscopic  . NISSEN FUNDOPLICATION    . PORTACATH PLACEMENT N/A 03/23/2013   Procedure: INSERTION PORT-A-CATH;  Surgeon: Haywood M Ingram, MD;  Location: [facility];  Service: General;  Laterality: N/A;  . TUBAL LIGATION       ALLERGIES:  Allergies  Allergen Reactions  . Betadine [Povidone Iodine] Hives and Itching  . Codeine     REACTION: SICK-NAUSEATED     CURRENT MEDICATIONS:  Outpatient Encounter Prescriptions as of 12/22/2015  Medication Sig  . aspirin 81 MG tablet Take 81 mg by mouth every other day.   . buPROPion (WELLBUTRIN XL) 150 MG 24 hr tablet TAKE ONE TABLET BY MOUTH ONCE DAILY *NO MORE REFILLS WITHOUT BLOOD PRESSURE FOLLOW UP WITH DOCTOR*  . cholecalciferol (VITAMIN D) 1000 UNITS tablet Take 1,000 Units by mouth daily.  . FLUoxetine (PROZAC) 40 MG capsule Take 1 capsule (40 mg total) by mouth daily. Office visit due now  . lisinopril-hydrochlorothiazide (PRINZIDE,ZESTORETIC) 20-25 MG tablet 1 tab by mouth daily--  . omeprazole (PRILOSEC OTC) 20 MG tablet Take 1 tablet (20 mg total) by mouth daily as needed.  . valACYclovir (VALTREX) 1000 MG tablet Take 1 tablet (1,000 mg total) by mouth 3 (three) times daily. (Patient taking differently: Take 1,000 mg by mouth 3 (three) times daily as needed. )  . vitamin B-12 (CYANOCOBALAMIN) 1000 MCG tablet Take 1,000 mcg by mouth daily.  . [DISCONTINUED] fluticasone (FLONASE) 50 MCG/ACT nasal spray Place 2 sprays into both nostrils daily. (Patient taking differently: Place 2 sprays into both nostrils daily as needed. )  . [DISCONTINUED] HYDROcodone-acetaminophen (NORCO) 5-325 MG tablet Take 1 tablet by mouth every 6 (six) hours as needed for moderate pain.  . [DISCONTINUED] levocetirizine (XYZAL) 5 MG tablet Take 1 tablet (5 mg total) by mouth every evening. (Patient taking  differently: Take 5 mg by mouth daily as needed. )  . [DISCONTINUED] nitrofurantoin, macrocrystal-monohydrate, (MACROBID) 100 MG capsule Take 1 capsule (100 mg total) by mouth 2 (two) times daily.   No facility-administered encounter medications on file as of 12/22/2015.      ONCOLOGIC FAMILY HISTORY:  Family History  Problem Relation Age of Onset  . Coronary artery disease      with hernia repair  . Migraines    . Hypertension    . Breast cancer Maternal Grandmother   . Cancer Maternal Grandmother 50    breast  . Heart disease Mother   . Colon cancer Neg Hx     HEALTH MAINTENANCE:  -Last physical with PCP: ~   1 year ago; pt understands she needs to be seen by PCP annually -Last mammogram: "I think it has been about 2 years" -Last pap smear: Sometime in 2015 per patient -Last colonoscopy: 2013; pt was told to return in 10 years    SOCIAL HISTORY:  Anna Jackson is widowed and lives alone in [place], [place].  Her husband of 25+ years died in 2012 from lung cancer.  Then, her sister died suddenly about 6 months after that.  Then, her only child (a son) died by suicide in 2013.  She does have 1 niece (her sister's daughter), who has 2 children, that Anna Jackson is like the surrogate grandmother for.  Her "grandchildren" are 1 (Parker) and 3 years old (Bentley).  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  She denies any tobacco or illicit drug use; she drinks alcohol seldomly.     PHYSICAL EXAMINATION:  Vital Signs: Vitals:   12/22/15 0912  BP: 125/77  Pulse: 96  Resp: 18  Temp: 98.3 F (36.8 C)   Filed Weights   12/22/15 0912  Weight: 247 lb 4.8 oz (112.2 kg)   General: Well-nourished, well-appearing female in no acute distress. Unaccompanied today. HEENT: Head is normocephalic.  Pupils equal and reactive to light. Conjunctivae clear without exudate.  Sclerae anicteric. Oral mucosa is  pink, moist.  Oropharynx is pink without lesions or erythema.  Lymph: No cervical, supraclavicular, infraclavicular, axillary, or inguinal lymphadenopathy noted on palpation.  Cardiovascular: Regular rate and rhythm.. Respiratory: Clear to auscultation bilaterally. Chest expansion symmetric; breathing non-labored.  Breast Exam:  -Left breast: No palpable masses/nodularity.  -Right breast: No palpable masses/nodularity.  -Axilla: Left axilla with healed scar s/p excision; no nodularity to scar; no lymphadenopathy bilaterally.  GI: Abdomen soft and round; non-tender, non-distended. Bowel sounds normoactive. No hepatosplenomegaly.   GU: Deferred.  Neuro/MSK: No focal deficits. Steady gait. Left shoulder with full ROM; No left glenohumeral joint tenderness to palpation;  No pain with supraspinatus extension/flexion.  Psych: Mood and affect normal and appropriate for situation.  Extremities: No edema. Skin: Warm and dry.   LABORATORY DATA:  CBC    Component Value Date/Time   WBC 5.9 12/22/2015 0854   WBC 5.8 08/26/2014 1642   RBC 4.25 12/22/2015 0854   RBC 4.29 08/26/2014 1642   HGB 11.6 12/22/2015 0854   HCT 36.1 12/22/2015 0854   PLT 326 12/22/2015 0854   MCV 84.8 12/22/2015 0854   MCH 27.2 12/22/2015 0854   MCH 27.4 03/29/2013 0747   MCHC 32.1 12/22/2015 0854   MCHC 33.7 08/26/2014 1642   RDW 14.7 (H) 12/22/2015 0854   LYMPHSABS 1.2 12/22/2015 0854   MONOABS 0.5 12/22/2015 0854   EOSABS 0.1 12/22/2015 0854   BASOSABS 0.0 12/22/2015 0854   CMP Latest Ref Rng & Units 12/22/2015 06/19/2015 05/20/2015  Glucose 70 - 140 mg/dl 83 91 93  BUN 7.0 - 26.0 mg/dL 16.3 10.0 19  Creatinine 0.6 - 1.1 mg/dL 0.8 0.9 0.78  Sodium 136 - 145 mEq/L 140 140 137  Potassium 3.5 - 5.1 mEq/L 4.0 3.4(L) 3.3(L)  Chloride 96 - 112 mEq/L - - 99  CO2 22 - 29 mEq/L 28 28 32  Calcium 8.4 - 10.4 mg/dL 9.4 9.5 9.7  Total Protein 6.4 - 8.3 g/dL 6.8 6.6 7.1  Total Bilirubin 0.20 - 1.20 mg/dL 0.41 0.53 0.7    Alkaline Phos 40 - 150 U/L 152(H) 139 144(H)    AST 5 - 34 U/L 13 21 14  ALT 0 - 55 U/L 14 34 15    *Labs reviewed in detail by this provider with the patient during today's visit.  Values are largely stable/within normal limits.   DIAGNOSTIC IMAGING:  None for this visit.     ASSESSMENT AND PLAN:  Anna Jackson is a pleasant 55 y.o. female with history of Stage II T-cell lymphoma, ALK (+), diagnosed in 02/2013; treated with CHOP + Etoposide chemotherapy x 6 cycles (Vincristine dose-reduced d/t peripheral neuropathy); chemotherapy completed on 07/20/13; post-treatment PET scan showed complete response to treatment. She has been without evidence of disease since that time.  Anna Jackson presents to the [facility] for surveillance and routine follow-up.   1. History of Stage II T-cell lymphoma:  Anna Jackson is currently clinically without evidence of disease; she has no lymphadenopathy on physical exam and her lab studies are stable.  She does have new left arm pain, which she is concerned about as this was the site of her initial diagnosis of lymphoma. (see #2 below).   I shared with her that I have low suspicion that her symptoms are related to her history of lymphoma, however we will proceed with imaging.  If the MRI is negative for malignancy, then she will return to the survivorship clinic in 06/2016 with labs preceding visit.  I encouraged her to call me with any questions or concerns before her next visit at the cancer center, and I would be happy to see the patient sooner, if needed.    2. Left upper arm/shoulder pain:  I was not able to reproduce the pain on physical exam.  She has good range of motion to the shoulder. No glenohumeral joint tenderness.  No pain with supraspinatus extension/flexion. As stated above, I have low suspicion that this left humeral/shoulder pain is related to her history of lymphoma. However, given that her symptoms are new and persistent, they  require additional evaluation. Therefore, MRI of the left humerus was ordered today with plans to have this study completed sometime within the next month. We discussed that if the MRI confirms no suspicion for cancer, then she should follow-up with her PCP where additional referrals to orthopedic specialists or physical therapy may be necessary. She agreed with above plan of care.  3. Cancer screening:  Due to Anna Jackson's history and age, she should receive screening for skin cancers, breast cancer, colon cancer, and gynecologic cancers. She reportedly has not had a mammogram in quite some time; orders placed today for her to have mammogram completed at the [facility] sometime within the next month. The patient was encouraged to follow-up with her PCP for other appropriate cancer screenings.  Regarding her concerns about whether she is post-menopausal or not, we discussed that hormone level lab tests can be ordered by her PCP to assess her current menopause status. She agreed with this plan.   4. Health maintenance and wellness promotion: Anna Jackson was encouraged to consume 5-7 servings of fruits and vegetables per day. The patient was also encouraged to engage in moderate to vigorous exercise for 30 minutes per day most days of the week. Anna Jackson was instructed to limit her alcohol consumption and continue to abstain from tobacco use.    Dispo:  -Return to cancer center to see Survivorship NP in 6 months (06/2016) with labs preceding visit.    A total of 35 minutes of face-to-face time was spent with this patient   with greater than 50% of that time in counseling and care-coordination.   [name] Craze, NP Survivorship Program [facility] [phone]   Note: PRIMARY CARE PROVIDER Rosalita Chessman [place], [place] [phone]

## 2015-12-23 NOTE — Telephone Encounter (Signed)
She would need to be seen to get a diet pill--

## 2015-12-30 ENCOUNTER — Telehealth: Payer: Self-pay | Admitting: Family Medicine

## 2015-12-30 NOTE — Telephone Encounter (Signed)
Patient is requesting a refill of buPROPion (WELLBUTRIN XL) 150 MG 24 hr tablet. Patient is aware that she needs to have an appointment to get this refilled. Her appt is set for the next available follow up appt. 02/17/16. She is completely out and is starting to get a headache. She would like to know if she could get enough to last her until her appt. Please advise.   Pharmacy: [facility] [address] [place]  Patient phone: [phone]

## 2016-01-01 ENCOUNTER — Other Ambulatory Visit: Payer: Self-pay | Admitting: Family Medicine

## 2016-01-01 MED ORDER — BUPROPION HCL ER (XL) 150 MG PO TB24: ORAL_TABLET | ORAL | 0 refills | Status: DC

## 2016-01-01 NOTE — Telephone Encounter (Signed)
Medication filled to pharmacy as requested.   

## 2016-01-01 NOTE — Telephone Encounter (Signed)
I sent 30 days--- then realized I should have put refill on it-- would you change it to 90 day ?  Sorry-- Jonna Munro

## 2016-01-02 ENCOUNTER — Telehealth: Payer: Self-pay

## 2016-01-02 NOTE — Telephone Encounter (Signed)
[place] to cancel Wellbutrin per A. Langston,RN

## 2016-01-02 NOTE — Telephone Encounter (Signed)
-----   Message from Rudene Anda, RN sent at 01/01/2016 12:12 PM EST -----   ----- Message ----- From: Ann Held, DO Sent: 01/01/2016  11:08 AM To: Rudene Anda, RN  Please cancel wellbutrin downstairs

## 2016-01-13 ENCOUNTER — Encounter: Payer: Self-pay | Admitting: Family Medicine

## 2016-01-13 ENCOUNTER — Ambulatory Visit (INDEPENDENT_AMBULATORY_CARE_PROVIDER_SITE_OTHER): Payer: Managed Care, Other (non HMO) | Admitting: Family Medicine

## 2016-01-13 DIAGNOSIS — R5383 Other fatigue: Secondary | ICD-10-CM

## 2016-01-13 DIAGNOSIS — E785 Hyperlipidemia, unspecified: Secondary | ICD-10-CM

## 2016-01-13 DIAGNOSIS — F329 Major depressive disorder, single episode, unspecified: Secondary | ICD-10-CM

## 2016-01-13 DIAGNOSIS — E538 Deficiency of other specified B group vitamins: Secondary | ICD-10-CM | POA: Diagnosis not present

## 2016-01-13 DIAGNOSIS — I10 Essential (primary) hypertension: Secondary | ICD-10-CM

## 2016-01-13 LAB — COMPREHENSIVE METABOLIC PANEL
ALBUMIN: 3.9 g/dL (ref 3.5–5.2)
ALK PHOS: 128 U/L — AB (ref 39–117)
ALT: 12 U/L (ref 0–35)
AST: 13 U/L (ref 0–37)
BILIRUBIN TOTAL: 0.3 mg/dL (ref 0.2–1.2)
BUN: 13 mg/dL (ref 6–23)
CALCIUM: 9.5 mg/dL (ref 8.4–10.5)
CO2: 31 meq/L (ref 19–32)
CREATININE: 1.04 mg/dL (ref 0.40–1.20)
Chloride: 101 mEq/L (ref 96–112)
GFR: 58.88 mL/min — AB (ref >60.00)
Glucose, Bld: 100 mg/dL — ABNORMAL HIGH (ref 70–99)
Potassium: 3.9 mEq/L (ref 3.5–5.1)
Sodium: 140 mEq/L (ref 135–145)
TOTAL PROTEIN: 6.9 g/dL (ref 6.0–8.3)

## 2016-01-13 LAB — TSH: TSH: 1.05 u[IU]/mL (ref 0.35–4.50)

## 2016-01-13 LAB — VITAMIN B12: Vitamin B-12: 570 pg/mL (ref 211–911)

## 2016-01-13 MED ORDER — BUPROPION HCL ER (XL) 150 MG PO TB24: ORAL_TABLET | ORAL | 3 refills | Status: DC

## 2016-01-13 MED ORDER — LORCASERIN HCL ER 20 MG PO TB24: 1.0000 | ORAL_TABLET | Freq: Every day | ORAL | 2 refills | Status: DC

## 2016-01-13 MED FILL — BELVIQ XR 20 MG TABLET: 20 | 30 days supply | Qty: 30 | Fill #0

## 2016-01-13 NOTE — Assessment & Plan Note (Signed)
con't diet and exercise Try belviq xr daily Pt tried and failed adipex

## 2016-01-13 NOTE — Progress Notes (Signed)
Patient ID: Anna Jackson, female    DOB: 1960-11-17  Age: 55 y.o. MRN: [id number]    Subjective:  Subjective  HPI ANIKA HANIGAN presents for c/o 1 episode nausea / vomiting.  None since then.  Now pt feels weak and tired only.  Pt also frustrated with 15 lbs weight gain.  She is going to the gym and trying to watch what she eats.    Review of Systems  Constitutional: Negative for appetite change, diaphoresis, fatigue and unexpected weight change.  Eyes: Negative for pain, redness and visual disturbance.  Respiratory: Negative for cough, chest tightness, shortness of breath and wheezing.   Cardiovascular: Negative for chest pain, palpitations and leg swelling.  Gastrointestinal: Positive for nausea and vomiting.  Endocrine: Negative for cold intolerance, heat intolerance, polydipsia, polyphagia and polyuria.  Genitourinary: Negative for difficulty urinating, dysuria and frequency.  Neurological: Negative for dizziness, light-headedness, numbness and headaches.    History Past Medical History:  Diagnosis Date  . Adenopathy    left axillary  . Anemia   . Anxiety   . Anxiety state, unspecified   . Asthma    no problem with asthma in several years  . Barrett esophagus   . Depression   . Esophageal reflux   . Headache 05/16/2013  . Herpes simplex without mention of complication   . Hyperlipidemia 03/12/2014  . Hypertension   . Lymphoma, T-cell ([place]) 03/20/2013  . Mental disorder   . Neuropathic pain 04/04/2014  . Skin infection 11/02/2013  . Unspecified asthma(493.90)   . Unspecified essential hypertension   . URI (upper respiratory infection) 01/15/2014    She has a past surgical history that includes Cholecystectomy; Abdominal hysterectomy (2006); Nissen fundoplication; Cervical conization w/bx; Tubal ligation; Knee surgery (02/2010); Axillary lymph node dissection (Left, 03/09/2013); and Portacath placement (N/A, 03/23/2013).   Her family history includes Breast cancer in  her maternal grandmother; Cancer (age of onset: 29) in her maternal grandmother; Heart disease in her mother.She reports that she has never smoked. She has never used smokeless tobacco. She reports that she drinks alcohol. She reports that she does not use drugs.  Current Outpatient Prescriptions on File Prior to Visit  Medication Sig Dispense Refill  . aspirin 81 MG tablet Take 81 mg by mouth every other day.     . cholecalciferol (VITAMIN D) 1000 UNITS tablet Take 1,000 Units by mouth daily.    Marland Kitchen FLUoxetine (PROZAC) 40 MG capsule Take 1 capsule (40 mg total) by mouth daily. Office visit due now 90 capsule 1  . lisinopril-hydrochlorothiazide (PRINZIDE,ZESTORETIC) 20-25 MG tablet 1 tab by mouth daily-- 90 tablet 1  . omeprazole (PRILOSEC OTC) 20 MG tablet Take 1 tablet (20 mg total) by mouth daily as needed. 30 tablet 10  . valACYclovir (VALTREX) 1000 MG tablet Take 1 tablet (1,000 mg total) by mouth 3 (three) times daily. (Patient taking differently: Take 1,000 mg by mouth 3 (three) times daily as needed. ) 90 tablet 1  . vitamin B-12 (CYANOCOBALAMIN) 1000 MCG tablet Take 1,000 mcg by mouth daily.     No current facility-administered medications on file prior to visit.      Objective:  Objective  Physical Exam  Constitutional: She is oriented to person, place, and time. She appears well-developed and well-nourished.  HENT:  Head: Normocephalic and atraumatic.  Eyes: Conjunctivae and EOM are normal.  Neck: Normal range of motion. Neck supple. No JVD present. Carotid bruit is not present. No thyromegaly present.  Cardiovascular: Normal rate,  regular rhythm and normal heart sounds.   No murmur heard. Pulmonary/Chest: Effort normal and breath sounds normal. No respiratory distress. She has no wheezes. She has no rales. She exhibits no tenderness.  Musculoskeletal: She exhibits no edema.  Neurological: She is alert and oriented to person, place, and time.  Psychiatric: She has a normal mood  and affect. Her behavior is normal. Judgment and thought content normal.  Nursing note and vitals reviewed.  BP 118/78 (BP Location: Right Arm, Patient Position: Sitting, Cuff Size: Large)   Pulse 77   Temp 98.1 F (36.7 C) (Oral)   Resp 16   Ht 5\' 6"  (1.676 m)   Wt 250 lb 6.4 oz (113.6 kg)   SpO2 98%   BMI 40.42 kg/m  Wt Readings from Last 3 Encounters:  01/13/16 250 lb 6.4 oz (113.6 kg)  12/22/15 247 lb 4.8 oz (112.2 kg)  09/22/15 233 lb 6.4 oz (105.9 kg)     Lab Results  Component Value Date   WBC 5.9 12/22/2015   HGB 11.6 12/22/2015   HCT 36.1 12/22/2015   PLT 326 12/22/2015   GLUCOSE 83 12/22/2015   CHOL 201 (H) 05/20/2015   TRIG 83.0 05/20/2015   HDL 73.10 05/20/2015   LDLDIRECT 105.0 11/23/2010   LDLCALC 112 (H) 05/20/2015   ALT 14 12/22/2015   AST 13 12/22/2015   NA 140 12/22/2015   K 4.0 12/22/2015   CL 99 05/20/2015   CREATININE 0.8 12/22/2015   BUN 16.3 12/22/2015   CO2 28 12/22/2015   TSH 0.92 08/26/2014   INR 0.99 03/29/2013    Dg Tibia/fibula Right  Result Date: 09/22/2015 CLINICAL DATA:  [place] yesterday and injured right leg. EXAM: RIGHT TIBIA AND FIBULA - 2 VIEW COMPARISON:  None. FINDINGS: The knee and ankle joints are maintained. No acute fracture of the tibia or fibula is identified. Mild degenerative changes at the knee and ankle. Calcaneal spurring changes. No ankle joint effusion. No definite ankle fractures. IMPRESSION: No acute bony findings. Electronically Signed   By: Marijo Sanes M.D.   On: 09/22/2015 18:34   Dg Knee 4 Views W/patella Left  Result Date: 09/22/2015 CLINICAL DATA:  Acute left knee pain after fall at grocery store. EXAM: LEFT KNEE - COMPLETE 4+ VIEW COMPARISON:  None. FINDINGS: No evidence of fracture, dislocation, or joint effusion. Moderate narrowing of medial joint space is noted. Soft tissues are unremarkable. IMPRESSION: Moderate degenerative joint disease is noted medially. No acute abnormality seen the left knee.  Electronically Signed   By: Marijo Conception, M.D.   On: 09/22/2015 18:31     Assessment & Plan:  Plan  I am having Ms. Jesus start on Lorcaserin HCl ER. I am also having her maintain her aspirin, omeprazole, cholecalciferol, vitamin B-12, FLUoxetine, lisinopril-hydrochlorothiazide, valACYclovir, and buPROPion.  Meds ordered this encounter  Medications  . Lorcaserin HCl ER (BELVIQ XR) 20 MG TB24    Sig: Take 1 tablet by mouth daily.    Dispense:  30 tablet    Refill:  2  . buPROPion (WELLBUTRIN XL) 150 MG 24 hr tablet    Sig: TAKE ONE TABLET BY MOUTH ONCE DAILY    Dispense:  90 tablet    Refill:  3    D/C PREVIOUS SCRIPTS FOR THIS MEDICATION    Problem List Items Addressed This Visit      Unprioritized   Hyperlipidemia   Essential hypertension    stable      Morbid obesity ([place])  con't diet and exercise Try belviq xr daily Pt tried and failed adipex      Relevant Medications   Lorcaserin HCl ER (BELVIQ XR) 20 MG TB24    Other Visit Diagnoses    Obesity, morbid ([place])    -  Primary   Relevant Medications   Lorcaserin HCl ER (BELVIQ XR) 20 MG TB24   Other Relevant Orders   CBC with Differential/Platelet   Comprehensive metabolic panel   Vitamin 123456   Vitamin D 1,25 dihydroxy   TSH   Fatigue, unspecified type       Relevant Orders   CBC with Differential/Platelet   Comprehensive metabolic panel   Vitamin 123456   Vitamin D 1,25 dihydroxy   TSH   Vitamin B 12 deficiency       Relevant Orders   Vitamin B12   Single current episode of major depressive disorder, unspecified depression episode severity       Relevant Medications   buPROPion (WELLBUTRIN XL) 150 MG 24 hr tablet      Follow-up: Return in about 3 months (around 04/14/2016) for hypertension, hyperlipidemia.  Ann Held, DO

## 2016-01-13 NOTE — Assessment & Plan Note (Signed)
stable °

## 2016-01-13 NOTE — Progress Notes (Signed)
Pre visit review using our clinic review tool, if applicable. No additional management support is needed unless otherwise documented below in the visit note. 

## 2016-01-13 NOTE — Patient Instructions (Signed)
Nausea, Adult  Nausea is the feeling of an upset stomach or having to vomit. Nausea on its own is not usually a serious concern, but it may be an early sign of a more serious medical problem. As nausea gets worse, it can lead to vomiting. If vomiting develops, or if you are not able to drink enough fluids, you are at risk of becoming dehydrated. Dehydration can make you tired and thirsty, cause you to have a dry mouth, and decrease how often you urinate. Older adults and people with other diseases or a weak immune system are at higher risk for dehydration. The main goals of treating your nausea are:  · To limit repeated nausea episodes.  · To prevent vomiting and dehydration.    Follow these instructions at home:  Follow instructions from your health care provider about how to care for yourself at home.  Eating and drinking   Follow these recommendations as told by your health care provider:  · Take an oral rehydration solution (ORS). This is a drink that is sold at pharmacies and retail stores.  · Drink clear fluids in small amounts as you are able. Clear fluids include water, ice chips, diluted fruit juice, and low-calorie sports drinks.  · Eat bland, easy-to-digest foods in small amounts as you are able. These foods include bananas, applesauce, rice, lean meats, toast, and crackers.  · Avoid drinking fluids that contain a lot of sugar or caffeine, such as energy drinks, sports drinks, and soda.  · Avoid alcohol.  · Avoid spicy or fatty foods.    General instructions   · Drink enough fluid to keep your urine clear or pale yellow.  · Wash your hands often. If soap and water are not available, use hand sanitizer.  · Make sure that all people in your household wash their hands well and often.  · Rest at home while you recover.  · Take over-the-counter and prescription medicines only as told by your health care provider.  · Breathe slowly and deeply when you feel nauseous.  · Watch your condition for any  changes.  · Keep all follow-up visits as told by your health care provider. This is important.  Contact a health care provider if:  · You have a headache.  · You have new symptoms.  · Your nausea gets worse.  · You have a fever.  · You feel light-headed or dizzy.  · You vomit.  · You cannot keep fluids down.  Get help right away if:  · You have pain in your chest, neck, arm, or jaw.  · You feel extremely weak or you faint.  · You have vomit that is bright red or looks like coffee grounds.  · You have bloody or black stools or stools that look like tar.  · You have a severe headache, a stiff neck, or both.  · You have severe pain, cramping, or bloating in your abdomen.  · You have a rash.  · You have difficulty breathing or are breathing very quickly.  · Your heart is beating very quickly.  · Your skin feels cold and clammy.  · You feel confused.  · You have pain when you urinate.  · You have signs of dehydration, such as:  ? Dark urine, very little, or no urine.  ? Cracked lips.  ? Dry mouth.  ? Sunken eyes.  ? Sleepiness.  ? Weakness.  These symptoms may represent a serious problem that is an emergency. Do   not wait to see if the symptoms will go away. Get medical help right away. Call your local emergency services (911 in the [place] Do not drive yourself to the hospital.  This information is not intended to replace advice given to you by your health care provider. Make sure you discuss any questions you have with your health care provider.  Document Released: 03/11/2004 Document Revised: 07/07/2015 Document Reviewed: 10/08/2014  Elsevier Interactive Patient Education © 2017 Elsevier Inc.

## 2016-01-14 LAB — CBC WITH DIFFERENTIAL/PLATELET
BASOS ABS: 0.1 10*3/uL (ref 0.0–0.1)
Basophils Relative: 1.2 % (ref 0.0–3.0)
EOS ABS: 0.2 10*3/uL (ref 0.0–0.7)
Eosinophils Relative: 2.2 % (ref 0.0–5.0)
HEMATOCRIT: 34.8 % — AB (ref 36.0–46.0)
HEMOGLOBIN: 11.4 g/dL — AB (ref 12.0–15.0)
LYMPHS PCT: 20.3 % (ref 12.0–46.0)
Lymphs Abs: 1.5 10*3/uL (ref 0.7–4.0)
MCHC: 32.9 g/dL (ref 30.0–36.0)
MCV: 83.7 fl (ref 78.0–100.0)
MONOS PCT: 6.6 % (ref 3.0–12.0)
Monocytes Absolute: 0.5 10*3/uL (ref 0.1–1.0)
NEUTROS ABS: 5.3 10*3/uL (ref 1.4–7.7)
Neutrophils Relative %: 69.7 % (ref 43.0–77.0)
PLATELETS: 343 10*3/uL (ref 150.0–400.0)
RBC: 4.16 Mil/uL (ref 3.87–5.11)
RDW: 15 % (ref 11.5–15.5)
WBC: 7.6 10*3/uL (ref 4.0–10.5)

## 2016-01-15 LAB — VITAMIN D 1,25 DIHYDROXY
VITAMIN D3 1, 25 (OH): 34 pg/mL
Vitamin D 1, 25 (OH)2 Total: 34 pg/mL (ref 18–72)
Vitamin D2 1, 25 (OH)2: <8 pg/mL

## 2016-01-19 ENCOUNTER — Ambulatory Visit
Admission: RE | Admit: 2016-01-19 | Discharge: 2016-01-19 | Disposition: A | Discharge status: Home / Self Care | Payer: Managed Care, Other (non HMO) | Source: Ambulatory Visit | Attending: Adult Health | Admitting: Adult Health

## 2016-01-19 DIAGNOSIS — Z1231 Encounter for screening mammogram for malignant neoplasm of breast: Secondary | ICD-10-CM

## 2016-01-21 ENCOUNTER — Telehealth: Payer: Self-pay | Admitting: *Deleted

## 2016-01-21 NOTE — Telephone Encounter (Signed)
Called pt to let her know the results of mammo were unremarkable, didn't show any evidence of malignancy. Pt verbalized understanding. No further concerns. Message to be fwd to G. Dawson,NP.

## 2016-01-23 ENCOUNTER — Telehealth: Payer: Self-pay | Admitting: Adult Health

## 2016-01-23 NOTE — Telephone Encounter (Signed)
I attempted to reach Ms. Anna Jackson to follow-up on her left arm pain since her last visit about one month ago. I was unable to speak with her via phone, but didn't leave a detailed message and ask her to return my call.  We are working to get the MRI of her left arm approved by her insurance, she will need to change the diagnosis codes for this.  Per Dr. Calton Dach recommendation, if her symptoms have not improved then we will proceed with imaging.    Awaiting return call from patient with an update on her left arm pain symptoms.  If improved, we will hold off on the scan for now.   [name] Craze, NP [place] [phone]

## 2016-01-27 NOTE — Telephone Encounter (Signed)
Spoke to patient in regards to left arm pain. Pt has self initiated physical therapy and strength training. Since starting she has seen improved ROM and less pain. She notices a difference with drastic temperature changes. I advised pt to call [facility] if the pain returns or gets consistent as we will reorder the MRI and try to get the insurance authorization.

## 2016-02-06 ENCOUNTER — Telehealth: Payer: Self-pay | Admitting: Hematology and Oncology

## 2016-02-06 NOTE — Telephone Encounter (Signed)
Faxed record to [facility] °

## 2016-02-17 ENCOUNTER — Encounter: Payer: Self-pay | Admitting: Family Medicine

## 2016-02-17 ENCOUNTER — Ambulatory Visit (INDEPENDENT_AMBULATORY_CARE_PROVIDER_SITE_OTHER): Payer: Managed Care, Other (non HMO) | Admitting: Family Medicine

## 2016-02-17 VITALS — BP 112/77 | HR 78 | Temp 98.0°F | Resp 16 | Ht 66.0 in | Wt 249.0 lb

## 2016-02-17 DIAGNOSIS — I10 Essential (primary) hypertension: Secondary | ICD-10-CM | POA: Diagnosis not present

## 2016-02-17 DIAGNOSIS — F32A Depression, unspecified: Secondary | ICD-10-CM

## 2016-02-17 DIAGNOSIS — E785 Hyperlipidemia, unspecified: Secondary | ICD-10-CM | POA: Diagnosis not present

## 2016-02-17 DIAGNOSIS — F329 Major depressive disorder, single episode, unspecified: Secondary | ICD-10-CM

## 2016-02-17 DIAGNOSIS — F325 Major depressive disorder, single episode, in full remission: Secondary | ICD-10-CM

## 2016-02-17 LAB — COMPREHENSIVE METABOLIC PANEL
ALK PHOS: 121 U/L — AB (ref 39–117)
ALT: 19 U/L (ref 0–35)
AST: 18 U/L (ref 0–37)
Albumin: 3.8 g/dL (ref 3.5–5.2)
BILIRUBIN TOTAL: 0.4 mg/dL (ref 0.2–1.2)
BUN: 13 mg/dL (ref 6–23)
CO2: 29 mEq/L (ref 19–32)
Calcium: 9.7 mg/dL (ref 8.4–10.5)
Chloride: 102 mEq/L (ref 96–112)
Creatinine, Ser: 0.75 mg/dL (ref 0.40–1.20)
GFR: 85.83 mL/min (ref >60.00)
Glucose, Bld: 94 mg/dL (ref 70–99)
POTASSIUM: 3.5 meq/L (ref 3.5–5.1)
Sodium: 138 mEq/L (ref 135–145)
TOTAL PROTEIN: 6.6 g/dL (ref 6.0–8.3)

## 2016-02-17 LAB — LIPID PANEL
Cholesterol: 178 mg/dL (ref 0–200)
HDL: 63.8 mg/dL (ref >39.00)
LDL Cholesterol: 85 mg/dL (ref 0–99)
NonHDL: 113.92
TRIGLYCERIDES: 143 mg/dL (ref 0.0–149.0)
Total CHOL/HDL Ratio: 3
VLDL: 28.6 mg/dL (ref 0.0–40.0)

## 2016-02-17 MED ORDER — FLUOXETINE HCL 20 MG PO TABS: 20.0000 mg | ORAL_TABLET | Freq: Every day | ORAL | 3 refills | Status: DC

## 2016-02-17 MED ORDER — LORCASERIN HCL ER 20 MG PO TB24: 1.0000 | ORAL_TABLET | Freq: Every day | ORAL | 2 refills | Status: DC

## 2016-02-17 NOTE — Patient Instructions (Signed)
Carbohydrate Counting for Diabetes Mellitus, Adult Carbohydrate counting is a method for keeping track of how many carbohydrates you eat. Eating carbohydrates naturally increases the amount of sugar (glucose) in the blood. Counting how many carbohydrates you eat helps keep your blood glucose within normal limits, which helps you manage your diabetes (diabetes mellitus). It is important to know how many carbohydrates you can safely have in each meal. This is different for every person. A diet and nutrition specialist (registered dietitian) can help you make a meal plan and calculate how many carbohydrates you should have at each meal and snack. Carbohydrates are found in the following foods:  Grains, such as breads and cereals.  Dried beans and soy products.  Starchy vegetables, such as potatoes, peas, and corn.  Fruit and fruit juices.  Milk and yogurt.  Sweets and snack foods, such as cake, cookies, candy, chips, and soft drinks. How do I count carbohydrates? There are two ways to count carbohydrates in food. You can use either of the methods or a combination of both. Reading "Nutrition Facts" on packaged food  The "Nutrition Facts" list is included on the labels of almost all packaged foods and beverages in the [place] It includes:  The serving size.  Information about nutrients in each serving, including the grams (g) of carbohydrate per serving. To use the "Nutrition Facts":  Decide how many servings you will have.  Multiply the number of servings by the number of carbohydrates per serving.  The resulting number is the total amount of carbohydrates that you will be having. Learning standard serving sizes of other foods  When you eat foods containing carbohydrates that are not packaged or do not include "Nutrition Facts" on the label, you need to measure the servings in order to count the amount of carbohydrates:  Measure the foods that you will eat with a food scale or measuring  cup, if needed.  Decide how many standard-size servings you will eat.  Multiply the number of servings by 15. Most carbohydrate-rich foods have about 15 g of carbohydrates per serving.  For example, if you eat 8 oz (170 g) of strawberries, you will have eaten 2 servings and 30 g of carbohydrates (2 servings x 15 g = 30 g).  For foods that have more than one food mixed, such as soups and casseroles, you must count the carbohydrates in each food that is included. The following list contains standard serving sizes of common carbohydrate-rich foods. Each of these servings has about 15 g of carbohydrates:   hamburger bun or  English muffin.   oz (15 mL) syrup.   oz (14 g) jelly.  1 slice of bread.  1 six-inch tortilla.  3 oz (85 g) cooked rice or pasta.  4 oz (113 g) cooked dried beans.  4 oz (113 g) starchy vegetable, such as peas, corn, or potatoes.  4 oz (113 g) hot cereal.  4 oz (113 g) mashed potatoes or  of a large baked potato.  4 oz (113 g) canned or frozen fruit.  4 oz (120 mL) fruit juice.  4-6 crackers.  6 chicken nuggets.  6 oz (170 g) unsweetened dry cereal.  6 oz (170 g) plain fat-free yogurt or yogurt sweetened with artificial sweeteners.  8 oz (240 mL) milk.  8 oz (170 g) fresh fruit or one small piece of fruit.  24 oz (680 g) popped popcorn. Example of carbohydrate counting Sample meal  3 oz (85 g) chicken breast.  6 oz (  170 g) brown rice.  4 oz (113 g) corn.  8 oz (240 mL) milk.  8 oz (170 g) strawberries with sugar-free whipped topping. Carbohydrate calculation 1. Identify the foods that contain carbohydrates:  Rice.  Corn.  Milk.  Strawberries. 2. Calculate how many servings you have of each food:  2 servings rice.  1 serving corn.  1 serving milk.  1 serving strawberries. 3. Multiply each number of servings by 15 g:  2 servings rice x 15 g = 30 g.  1 serving corn x 15 g = 15 g.  1 serving milk x 15 g = 15  g.  1 serving strawberries x 15 g = 15 g. 4. Add together all of the amounts to find the total grams of carbohydrates eaten:  30 g + 15 g + 15 g + 15 g = 75 g of carbohydrates total. This information is not intended to replace advice given to you by your health care provider. Make sure you discuss any questions you have with your health care provider. Document Released: 02/01/2005 Document Revised: 08/22/2015 Document Reviewed: 07/16/2015 Elsevier Interactive Patient Education  2017 Elsevier Inc.  

## 2016-02-17 NOTE — Progress Notes (Signed)
Patient ID: Anna Jackson, female    DOB: 01-Dec-1960  Age: 56 y.o. MRN: [id number]    Subjective:  Subjective  HPI Anna Jackson presents for f/u weight.  She started back with trainer today.   Scale is not budging much but she has lost inches per pt.  No complaints. She also needs labs for cholesterol.   Review of Systems  Constitutional: Negative for appetite change, diaphoresis, fatigue and unexpected weight change.  Eyes: Negative for pain, redness and visual disturbance.  Respiratory: Negative for cough, chest tightness, shortness of breath and wheezing.   Cardiovascular: Negative for chest pain, palpitations and leg swelling.  Endocrine: Negative for cold intolerance, heat intolerance, polydipsia, polyphagia and polyuria.  Genitourinary: Negative for difficulty urinating, dysuria and frequency.  Neurological: Negative for dizziness, light-headedness, numbness and headaches.    History Past Medical History:  Diagnosis Date  . Adenopathy    left axillary  . Anemia   . Anxiety   . Anxiety state, unspecified   . Asthma    no problem with asthma in several years  . Barrett esophagus   . Depression   . Esophageal reflux   . Headache 05/16/2013  . Herpes simplex without mention of complication   . Hyperlipidemia 03/12/2014  . Hypertension   . 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 Mental disorder   . Neuropathic pain 04/04/2014  . Skin infection 11/02/2013  . Unspecified asthma(493.90)   . Unspecified essential hypertension   . URI (upper respiratory infection) 01/15/2014    She has a past surgical history that includes Cholecystectomy; Abdominal hysterectomy (2006); Nissen fundoplication; Cervical conization w/bx; Tubal ligation; Knee surgery (02/2010); Axillary lymph node dissection (Left, 03/09/2013); and Portacath placement (N/A, 03/23/2013).   Her family history includes Breast cancer in her maternal grandmother; Cancer (age of onset: 25) in her maternal grandmother;  Heart disease in her mother.She reports that she has never smoked. She has never used smokeless tobacco. She reports that she drinks alcohol. She reports that she does not use drugs.  Current Outpatient Prescriptions on File Prior to Visit  Medication Sig Dispense Refill  . lisinopril-hydrochlorothiazide (PRINZIDE,ZESTORETIC) 20-25 MG tablet 1 tab by mouth daily-- 90 tablet 1  . omeprazole (PRILOSEC OTC) 20 MG tablet Take 1 tablet (20 mg total) by mouth daily as needed. 30 tablet 10  . valACYclovir (VALTREX) 1000 MG tablet Take 1 tablet (1,000 mg total) by mouth 3 (three) times daily. (Patient taking differently: Take 1,000 mg by mouth 3 (three) times daily as needed. ) 90 tablet 1  . vitamin B-12 (CYANOCOBALAMIN) 1000 MCG tablet Take 1,000 mcg by mouth daily.    . cholecalciferol (VITAMIN D) 1000 UNITS tablet Take 1,000 Units by mouth daily.     No current facility-administered medications on file prior to visit.      Objective:  Objective  Physical Exam  Constitutional: She is oriented to person, place, and time. She appears well-developed and well-nourished.  HENT:  Head: Normocephalic and atraumatic.  Eyes: Conjunctivae and EOM are normal.  Neck: Normal range of motion. Neck supple. No JVD present. Carotid bruit is not present. No thyromegaly present.  Cardiovascular: Normal rate, regular rhythm and normal heart sounds.   No murmur heard. Pulmonary/Chest: Effort normal and breath sounds normal. No respiratory distress. She has no wheezes. She has no rales. She exhibits no tenderness.  Musculoskeletal: She exhibits no edema.  Neurological: She is alert and oriented to person, place, and time.  Psychiatric: She has a normal  mood and affect.  Nursing note and vitals reviewed.  BP 112/77 (BP Location: Right Arm, Patient Position: Sitting, Cuff Size: Large)   Pulse 78   Temp 98 F (36.7 C) (Oral)   Resp 16   Ht 5\' 6"  (1.676 m)   Wt 249 lb (112.9 kg)   SpO2 98%   BMI 40.19 kg/m    Wt Readings from Last 3 Encounters:  02/17/16 249 lb (112.9 kg)  01/13/16 250 lb 6.4 oz (113.6 kg)  12/22/15 247 lb 4.8 oz (112.2 kg)     Lab Results  Component Value Date   WBC 7.6 01/13/2016   HGB 11.4 (L) 01/13/2016   HCT 34.8 (L) 01/13/2016   PLT 343.0 01/13/2016   GLUCOSE 100 (H) 01/13/2016   CHOL 201 (H) 05/20/2015   TRIG 83.0 05/20/2015   HDL 73.10 05/20/2015   LDLDIRECT 105.0 11/23/2010   LDLCALC 112 (H) 05/20/2015   ALT 12 01/13/2016   AST 13 01/13/2016   NA 140 01/13/2016   K 3.9 01/13/2016   CL 101 01/13/2016   CREATININE 1.04 01/13/2016   BUN 13 01/13/2016   CO2 31 01/13/2016   TSH 1.05 01/13/2016   INR 0.99 03/29/2013    Mm Screening Breast Tomo Bilateral  Result Date: 01/20/2016 CLINICAL DATA:  Screening. History of high risk excisional biopsy. EXAM: 2D DIGITAL SCREENING BILATERAL MAMMOGRAM WITH CAD AND ADJUNCT TOMO COMPARISON:  Previous exam(s). ACR Breast Density Category b: There are scattered areas of fibroglandular density. FINDINGS: Surgical clips are partially imaged within the left axilla. There are no findings suspicious for malignancy within either breast. Images were processed with CAD. IMPRESSION: No mammographic evidence of malignancy. A result letter of this screening mammogram will be mailed directly to the patient. RECOMMENDATION: Screening mammogram in one year. (Code:SM-B-01Y) BI-RADS CATEGORY  2: Benign. Electronically Signed   By: Franki Cabot M.D.   On: 01/20/2016 14:28     Assessment & Plan:  Plan  I have discontinued Ms. Westman's aspirin, FLUoxetine, and buPROPion. I am also having her start on FLUoxetine. Additionally, I am having her maintain her omeprazole, cholecalciferol, vitamin B-12, lisinopril-hydrochlorothiazide, valACYclovir, Vitamin D, and Lorcaserin HCl ER.  Meds ordered this encounter  Medications  . Cholecalciferol (VITAMIN D) 2000 units tablet    Sig: Take 2,000 Units by mouth daily.  . Lorcaserin HCl ER (BELVIQ  XR) 20 MG TB24    Sig: Take 1 tablet by mouth daily.    Dispense:  30 tablet    Refill:  2  . FLUoxetine (PROZAC) 20 MG tablet    Sig: Take 1 tablet (20 mg total) by mouth daily.    Dispense:  30 tablet    Refill:  3    Problem List Items Addressed This Visit      Unprioritized   Depression - Primary    Can decrease prozac to 20 mg-- pt is doing well and is requesting to dec prozac-- she has been off the wellbutrin       Relevant Medications   FLUoxetine (PROZAC) 20 MG tablet   Essential hypertension    con't lisinopril Running low today-- pt will check at home and if con't to run low we will dec dose      Relevant Orders   Lipid panel   Comprehensive metabolic panel   Hyperlipidemia    Check labs today      Relevant Orders   Lipid panel   Comprehensive metabolic panel    Other Visit Diagnoses  Obesity, morbid ([place])       Relevant Medications   Lorcaserin HCl ER (BELVIQ XR) 20 MG TB24      Follow-up: Return in about 3 months (around 05/17/2016) for hypertension, weight check .  Ann Held, DO

## 2016-02-17 NOTE — Assessment & Plan Note (Signed)
Check labs today.

## 2016-02-17 NOTE — Progress Notes (Signed)
Pre visit review using our clinic review tool, if applicable. No additional management support is needed unless otherwise documented below in the visit note. 

## 2016-02-17 NOTE — Assessment & Plan Note (Signed)
Can decrease prozac to 20 mg-- pt is doing well and is requesting to dec prozac-- she has been off the wellbutrin

## 2016-02-17 NOTE — Assessment & Plan Note (Signed)
con't lisinopril Running low today-- pt will check at home and if con't to run low we will dec dose

## 2016-02-18 ENCOUNTER — Other Ambulatory Visit: Payer: Self-pay | Admitting: Family Medicine

## 2016-02-18 DIAGNOSIS — E785 Hyperlipidemia, unspecified: Secondary | ICD-10-CM

## 2016-03-15 ENCOUNTER — Encounter: Payer: Self-pay | Admitting: Medical

## 2016-03-15 ENCOUNTER — Ambulatory Visit (INDEPENDENT_AMBULATORY_CARE_PROVIDER_SITE_OTHER): Payer: Managed Care, Other (non HMO) | Admitting: Medical

## 2016-03-15 VITALS — BP 121/82 | HR 104 | Temp 98.7°F | Resp 16 | Ht 66.0 in | Wt 246.0 lb

## 2016-03-15 DIAGNOSIS — F329 Major depressive disorder, single episode, unspecified: Secondary | ICD-10-CM

## 2016-03-15 DIAGNOSIS — F32A Depression, unspecified: Secondary | ICD-10-CM

## 2016-03-15 DIAGNOSIS — R6889 Other general symptoms and signs: Secondary | ICD-10-CM

## 2016-03-15 LAB — POC INFLUENZA A&B (BINAX/QUICKVUE)
Influenza A, POC: POSITIVE — AB
Influenza B, POC: POSITIVE — AB

## 2016-03-15 LAB — POCT RAPID STREP A (OFFICE): Rapid Strep A Screen: NEGATIVE

## 2016-03-15 MED ORDER — FLUOXETINE HCL 20 MG PO TABS: 20.0000 mg | ORAL_TABLET | Freq: Every day | ORAL | 3 refills | Status: DC

## 2016-03-15 MED ORDER — ONDANSETRON 8 MG PO TBDP: 8.0000 mg | ORAL_TABLET | Freq: Three times a day (TID) | ORAL | 0 refills | Status: DC | PRN

## 2016-03-15 MED ORDER — OSELTAMIVIR PHOSPHATE 75 MG PO CAPS: 75.0000 mg | ORAL_CAPSULE | Freq: Two times a day (BID) | ORAL | 0 refills | Status: DC

## 2016-03-15 NOTE — Progress Notes (Signed)
Pre visit review using our clinic review tool, if applicable. No additional management support is needed unless otherwise documented below in the visit note/SLS  Changed pharmacy per patient request, resent to [facility].

## 2016-03-15 NOTE — Patient Instructions (Signed)
You have flu both type a and typ b. Tx tamiflu. Start today. zofran rx for nausea and vomiting. Immodium for the diarrhea. Hydrate with propel.  Work note for one week  You should get better but sometimes dehydration or secondary infection can occur. If you worsen please let us know. If severe worsening symptoms then ED evaluation.  Follow up in 7 days or as needed

## 2016-03-15 NOTE — Progress Notes (Signed)
Subjective:    Patient ID: Anna Jackson, female    DOB: December 28, 1960, 56 y.o.   MRN: [id number]  HPI   Pt in for with acute onset of nausea, vomiting, diarrhea, non productive cough, lower back achiness and some abdomen pain. Pt was vomiting a lot this morning but now just dry heaves. Some fatigue. Pt states a lot of coworkers sick. One of coworkers has the flu. She works in close [employer]. Also 2 neices have had the flu.  Pt has some dry cough.   Pt had flu vaccine this year.   Today 6-7 loose stools.   Symptom onset today. Acute onset.   Review of Systems  Constitutional: Positive for fatigue. Negative for chills and fever.  HENT: Negative for congestion and ear discharge.   Respiratory: Positive for cough. Negative for apnea, choking, shortness of breath and wheezing.   Cardiovascular: Negative for chest pain and palpitations.  Gastrointestinal: Positive for nausea and vomiting. Negative for abdominal pain, blood in stool and constipation.  Musculoskeletal: Positive for myalgias. Negative for back pain, neck pain and neck stiffness.  Neurological: Negative for dizziness, speech difficulty, weakness, light-headedness, numbness and headaches.  Psychiatric/Behavioral:       Mood stable. Antidepressant refilled today.   Past Medical History:  Diagnosis Date  . Adenopathy    left axillary  . Anemia   . Anxiety   . Anxiety state, unspecified   . Asthma    no problem with asthma in several years  . Barrett esophagus   . Depression   . Esophageal reflux   . Headache 05/16/2013  . Herpes simplex without mention of complication   . Hyperlipidemia 03/12/2014  . Hypertension   . 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 Mental disorder   . Neuropathic pain 04/04/2014  . Skin infection 11/02/2013  . Unspecified asthma(493.90)   . Unspecified essential hypertension   . URI (upper respiratory infection) 01/15/2014     Social History   Social History  . Marital status: Single    Spouse name: N/A  . Number of children: 1  . Years of education: N/A   Occupational History  . [employer]   Social History Main Topics  . Smoking status: Never Smoker  . Smokeless tobacco: Never Used  . Alcohol use Yes     Comment: occasional  . Drug use: No  . Sexual activity: Not Currently    Partners: Male   Other Topics Concern  . Not on file   Social History Narrative   Exercise--  treadmill    Past Surgical History:  Procedure Laterality Date  . ABDOMINAL HYSTERECTOMY  2006  . AXILLARY LYMPH NODE DISSECTION Left 03/09/2013   Procedure: EXCISION LEFT AXILLARY LYMPH NODES;  Surgeon: [name], MD;  Location: [facility];  Service: General;  Laterality: Left;  . CERVICAL CONIZATION W/BX    . CHOLECYSTECTOMY     with hernia repair  . KNEE SURGERY  02/2010   Right--arthroscopic  . NISSEN FUNDOPLICATION    . PORTACATH PLACEMENT N/A 03/23/2013   Procedure: INSERTION PORT-A-CATH;  Surgeon: [name], MD;  Location: [facility];  Service: General;  Laterality: N/A;  . TUBAL LIGATION      Family History  Problem Relation Age of Onset  . Coronary artery disease      with hernia repair  . Migraines    . Hypertension    . Breast cancer Maternal Grandmother   . Cancer Maternal Grandmother 24  breast  . Heart disease Mother   . Colon cancer Neg Hx     Allergies  Allergen Reactions  . Betadine [Povidone Iodine] Hives and Itching  . Codeine     REACTION: SICK-NAUSEATED    Current Outpatient Prescriptions on File Prior to Visit  Medication Sig Dispense Refill  . Cholecalciferol (VITAMIN D) 2000 units tablet Take 2,000 Units by mouth daily.    Marland Kitchen lisinopril-hydrochlorothiazide (PRINZIDE,ZESTORETIC) 20-25 MG tablet 1 tab by mouth daily-- 90 tablet 1  . Lorcaserin HCl ER (BELVIQ XR) 20 MG TB24 Take 1 tablet by mouth daily. 30 tablet 2  . omeprazole (PRILOSEC OTC) 20 MG tablet Take 1 tablet (20 mg total) by mouth daily as needed. 30 tablet 10  .  valACYclovir (VALTREX) 1000 MG tablet Take 1 tablet (1,000 mg total) by mouth 3 (three) times daily. (Patient taking differently: Take 1,000 mg by mouth 3 (three) times daily as needed. ) 90 tablet 1  . vitamin B-12 (CYANOCOBALAMIN) 1000 MCG tablet Take 1,000 mcg by mouth daily.     No current facility-administered medications on file prior to visit.     BP 121/82 (BP Location: Left Arm, Patient Position: Sitting, Cuff Size: Large)   Pulse (!) 104   Temp 98.7 F (37.1 C) (Oral)   Resp 16   Ht 5\' 6"  (1.676 m)   Wt 246 lb (111.6 kg)   SpO2 99%   BMI 39.71 kg/m       Objective:   Physical Exam  General  Mental Status - Alert. General Appearance - Well groomed. Not in acute distress.  Skin Rashes- No Rashes.  HEENT Head- Normal. Ear Auditory Canal - Left- Normal. Right - Normal.Tympanic Membrane- Left- Normal. Right- Normal. Eye Sclera/Conjunctiva- Left- Normal. Right- Normal. Nose & Sinuses Nasal Mucosa- Left-  Boggy and Congested. Right-  Boggy and  Congested.Bilateral maxillary and frontal sinus pressure. Mouth & Throat Lips: Upper Lip- Normal: no dryness, cracking, pallor, cyanosis, or vesicular eruption. Lower Lip-Normal: no dryness, cracking, pallor, cyanosis or vesicular eruption. Buccal Mucosa- Bilateral- No Aphthous ulcers. Oropharynx- No Discharge or Erythema. Tonsils: Characteristics- Bilateral- No Erythema or Congestion. Size/Enlargement- Bilateral- No enlargement. Discharge- bilateral-None.  Neck Neck- Supple. No Masses.   Chest and Lung Exam Auscultation: Breath Sounds:-Clear even and unlabored.  Cardiovascular Auscultation:Rythm- Regular, rate and rhythm. Murmurs & Other Heart Sounds:Ausculatation of the heart reveal- No Murmurs.  Lymphatic Head & Neck General Head & Neck Lymphatics: Bilateral: Description- No Localized lymphadenopathy.  Abdomen-soft, nt, nd, +bs, no rebound or guarding and no organomegaly.  Back- no cva tednerness         Assessment & Plan:  You have flu both type a and typ b. Tx tamiflu. Start today. zofran rx for nausea and vomiting. Immodium for the diarrhea. Hydrate with propel.  Work note for one week  You should get better but sometimes dehydration or secondary infection can occur. If you worsen please let us know. If severe worsening symptoms then ED evaluation.  Follow up in 7 days or as needed  Giada Schoppe, Percell Miller, [employer]

## 2016-04-29 ENCOUNTER — Telehealth: Payer: Self-pay | Admitting: Family Medicine

## 2016-04-29 ENCOUNTER — Other Ambulatory Visit: Payer: Self-pay | Admitting: Family Medicine

## 2016-04-29 DIAGNOSIS — W19XXXA Unspecified fall, initial encounter: Secondary | ICD-10-CM

## 2016-04-29 NOTE — Telephone Encounter (Signed)
Relation to YH:OOIL Call back number:[phone]   Reason for call:  Patient requesting orthopedic referral due to patient falling while on vacation, Physical Therapy: [facility] Call Delora Fuel MD conducted hand surgery on left wrist. Surgery notes will be faxed over today to [phone] Attention Dr. Carollee Herter. Patient will be back in town this week, please advise

## 2016-04-29 NOTE — Telephone Encounter (Signed)
Ok to refer-- - ov notes will need to be faxed to ortho

## 2016-04-29 NOTE — Telephone Encounter (Signed)
Referral entered. Have not seen the notes

## 2016-05-04 ENCOUNTER — Encounter (INDEPENDENT_AMBULATORY_CARE_PROVIDER_SITE_OTHER): Payer: Self-pay | Admitting: Orthopaedic Surgery

## 2016-05-04 ENCOUNTER — Telehealth: Payer: Self-pay | Admitting: Family Medicine

## 2016-05-04 ENCOUNTER — Ambulatory Visit (INDEPENDENT_AMBULATORY_CARE_PROVIDER_SITE_OTHER): Payer: 59

## 2016-05-04 ENCOUNTER — Ambulatory Visit (INDEPENDENT_AMBULATORY_CARE_PROVIDER_SITE_OTHER): Payer: 59 | Admitting: Orthopaedic Surgery

## 2016-05-04 VITALS — Ht 65.0 in | Wt 246.0 lb

## 2016-05-04 DIAGNOSIS — M25532 Pain in left wrist: Secondary | ICD-10-CM

## 2016-05-04 DIAGNOSIS — S62102D Fracture of unspecified carpal bone, left wrist, subsequent encounter for fracture with routine healing: Secondary | ICD-10-CM
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
## 2016-05-04 NOTE — Telephone Encounter (Signed)
Caller name: Arnette Relationship to patient:  Can be reached: [phone]  Reason for call: Pt called stating we received records today approx 11:00am to fax [phone] from [facility], [place]. We need to send them to Dr. Durward Fortes at [facility]. Pt has appt with him today at 1pm. Please send urgently.

## 2016-05-04 NOTE — Telephone Encounter (Signed)
Patient will contact urgent care and have them re fax to [phone] attention Dr. Carollee Herter

## 2016-05-04 NOTE — Telephone Encounter (Signed)
Spoke with PCP who stated that records were faxed yesterday.  Called [facility] to verify that they had received it.  No answer. Left a message for call back.  Called never received.  Faxed records to [facility] just in case.

## 2016-05-07 ENCOUNTER — Encounter: Payer: Self-pay | Admitting: Family Medicine

## 2016-05-07 MED ORDER — HYDROCHLOROTHIAZIDE 25 MG PO TABS: 25.0000 mg | ORAL_TABLET | Freq: Every day | ORAL | 2 refills | Status: DC

## 2016-05-07 NOTE — Telephone Encounter (Signed)
hctz 25 mg 1 po qd,  #30  2 refills  Ov 2-3 weeks

## 2016-05-19 ENCOUNTER — Encounter (INDEPENDENT_AMBULATORY_CARE_PROVIDER_SITE_OTHER): Payer: Self-pay | Admitting: Orthopedic Surgery

## 2016-05-19 ENCOUNTER — Ambulatory Visit (INDEPENDENT_AMBULATORY_CARE_PROVIDER_SITE_OTHER): Payer: 59 | Admitting: Orthopedic Surgery

## 2016-05-19 VITALS — BP 144/93 | HR 86 | Resp 13 | Ht 68.0 in | Wt 257.0 lb

## 2016-05-19 DIAGNOSIS — S52532D Colles' fracture of left radius, subsequent encounter for closed fracture with routine healing: Secondary | ICD-10-CM | POA: Diagnosis not present

## 2016-05-19 MED ORDER — HYDROCODONE-ACETAMINOPHEN 7.5-325 MG PO TABS: 1.0000 | ORAL_TABLET | Freq: Four times a day (QID) | ORAL | 0 refills | Status: DC | PRN

## 2016-05-19 NOTE — Progress Notes (Signed)
Office Visit Note   Patient: Anna Jackson           Date of Birth: 05-10-1960           MRN: [id number] Visit Date: 05/19/2016              Requested by: Ann Held, DO [address] [place], [place] [address] PCP: Ann Held, DO   Assessment & Plan: Visit Diagnoses:  1. Closed Colles' fracture of left radius with routine healing, subsequent encounter     Plan: #1: Given a home exercise program to try to start with her range of motion. She is to painful yet to begin a formal physical therapy. #2: Return in 2 weeks and at that time plan for formal physical therapy. #3: Continue her bracing. #4: Norco prescription for pain  Follow-Up Instructions: Return in about 2 weeks (around 06/02/2016).   Orders:  No orders of the defined types were placed in this encounter.  Meds ordered this encounter  Medications  . HYDROcodone-acetaminophen (NORCO) 7.5-325 MG tablet    Sig: Take 1-2 tablets by mouth every 6 (six) hours as needed for moderate pain.    Dispense:  40 tablet    Refill:  0    Order Specific Question:   Supervising Provider    Answer:   Garald Balding [[id number]]      Procedures: No procedures performed   Clinical Data: No additional findings.   Subjective: Chief Complaint  Patient presents with  . Left Wrist - Follow-up    2 weeks status post closed, comminuted displaced intra-articular fracture of the left distal radius treated with open reduction internal fixation in [place]. No complications  Patient is here for her 1 week ROV of left wrist.  Patient is currently wearing left wrist splint.  States that left wrist hurts while using, still is swollen, and has soreness.  Taking Hydrocodone. She does remove the brace at home intermittently.    Review of Systems  Constitutional: Negative.   HENT: Negative.   Respiratory: Negative.   Cardiovascular: Negative.   Gastrointestinal: Negative.   Genitourinary: Negative.    Skin: Negative.   Neurological: Negative.   Hematological: Negative.   Psychiatric/Behavioral: Negative.      Objective: Vital Signs: BP (!) 144/93   Pulse 86   Resp 13   Ht 5\' 8"  (1.727 m)   Wt 257 lb (116.6 kg)   BMI 39.08 kg/m   Physical Exam  Constitutional: She is oriented to person, place, and time. She appears well-developed and well-nourished.  HENT:  Head: Normocephalic and atraumatic.  Eyes: EOM are normal. Pupils are equal, round, and reactive to light.  Pulmonary/Chest: Effort normal.  Neurological: She is alert and oriented to person, place, and time.  Skin: Skin is warm and dry.  Psychiatric: She has a normal mood and affect. Her behavior is normal. Judgment and thought content normal.    Ortho Exam  She lacks about the 20 of supination. She is also very similar to about 20 shy of full pronation. About 20 dorsiflexion and palmar flexion. Wound is well-healed.   Specialty Comments:  No specialty comments available.  Imaging: No results found.   PMFS History: Patient Active Problem List   Diagnosis Date Noted  . Vitamin B12 deficiency 10/10/2014  . Neuropathic pain 04/04/2014  . Hyperlipidemia 03/12/2014  . Obesity (BMI 30-39.9) 01/08/2014  . 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 Headache  05/16/2013  . History of lymphoma 03/20/2013  . Morbid obesity ([facility]) 08/08/2012  . Barrett's esophagus 08/10/2010  . Hemorrhoid 06/30/2010  . Heme positive stool 06/30/2010  . Depression 03/04/2009  . GERD 12/08/2007  . Anemia in neoplastic disease 12/08/2007  . Personal history of other diseases of digestive system 12/05/2007  . POSTMENOPAUSAL STATUS 11/21/2007  . ANXIETY 05/09/2007  . Pain in joint, lower leg 10/20/2006  . HERPES SIMPLEX, UNCOMPLICATED 00/76/2263  . ONYCHOMYCOSIS, TOENAILS 05/27/2006  . Essential hypertension 04/26/2006  . ASTHMA 04/26/2006  . HEADACHE 04/26/2006  . UMBILICAL HERNIORRHAPHY, HX OF 04/26/2006    Past Medical History:  Diagnosis Date  . Adenopathy    left axillary  . Anemia   . Anxiety   . Anxiety state, unspecified   . Asthma    no problem with asthma in several years  . Barrett esophagus   . Depression   . Esophageal reflux   . Headache 05/16/2013  . Herpes simplex without mention of complication   . Hyperlipidemia 03/12/2014  . Hypertension   . 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 Mental disorder   . Neuropathic pain 04/04/2014  . Skin infection 11/02/2013  . Unspecified asthma(493.90)   . Unspecified essential hypertension   . URI (upper respiratory infection) 01/15/2014    Family History  Problem Relation Age of Onset  . Heart disease Mother   . Coronary artery disease      with hernia repair  . Migraines    . Hypertension    . Breast cancer Maternal Grandmother   . Cancer Maternal Grandmother 50    breast  . Colon cancer Neg Hx     Past Surgical History:  Procedure Laterality Date  . ABDOMINAL HYSTERECTOMY  2006  . AXILLARY LYMPH NODE DISSECTION Left 03/09/2013   Procedure: EXCISION LEFT AXILLARY LYMPH NODES;  Surgeon: [name], MD;  Location: [facility];  Service: General;  Laterality: Left;  . CERVICAL CONIZATION W/BX    . CHOLECYSTECTOMY     with hernia repair  . KNEE SURGERY  02/2010   Right--arthroscopic  . NISSEN FUNDOPLICATION    . PORTACATH PLACEMENT N/A 03/23/2013   Procedure: INSERTION PORT-A-CATH;  Surgeon: [name], MD;  Location: [facility];  Service: General;  Laterality: N/A;  . TUBAL LIGATION     Social History   Occupational History  . [employer]   Social History Main Topics  . Smoking status: Never Smoker  . Smokeless tobacco: Never Used  . Alcohol use Yes     Comment: occasional  . Drug use: No  . Sexual activity: Not Currently    Partners: Male

## 2016-06-07 ENCOUNTER — Encounter (INDEPENDENT_AMBULATORY_CARE_PROVIDER_SITE_OTHER): Payer: Self-pay | Admitting: Orthopaedic Surgery

## 2016-06-07 ENCOUNTER — Other Ambulatory Visit (INDEPENDENT_AMBULATORY_CARE_PROVIDER_SITE_OTHER): Payer: Self-pay

## 2016-06-07 ENCOUNTER — Ambulatory Visit (INDEPENDENT_AMBULATORY_CARE_PROVIDER_SITE_OTHER): Payer: 59 | Admitting: Orthopaedic Surgery

## 2016-06-07 VITALS — BP 130/84 | HR 85 | Resp 16 | Ht 68.0 in | Wt 257.0 lb

## 2016-06-07 DIAGNOSIS — S52572D Other intraarticular fracture of lower end of left radius, subsequent encounter for closed fracture with routine healing: Secondary | ICD-10-CM

## 2016-06-07 DIAGNOSIS — M25532 Pain in left wrist: Secondary | ICD-10-CM

## 2016-06-07 NOTE — Progress Notes (Signed)
Office Visit Note   Patient: Anna Jackson           Date of Birth: 02/28/1960           MRN: [id number] Visit Date: 06/07/2016              Requested by: Ann Held, DO [address] [place], [place] [address] PCP: Ann Held, DO   Assessment & Plan: Visit Diagnoses:  1. Other closed intra-articular fracture of distal end of left radius with routine healing, subsequent encounter   6 weeks status post open reduction internal fixation of displaced left distal radius fracture performed in [place], [place]. Has been wearing the splint with some soreness and stiffness particularly with inclement weather. She has been working.  Plan: Begin physical therapy for range of motion exercises, discontinue the splint, office 3-4 weeks   Follow-Up Instructions: Return in about 3 weeks (around 06/28/2016).   Orders:  No orders of the defined types were placed in this encounter.  No orders of the defined types were placed in this encounter.     Procedures: No procedures performed   Clinical Data: No additional findings.   Subjective: Chief Complaint  Patient presents with  . Left Wrist - Follow-up    Anna Jackson is a 56 y o that is here for a follow up of a Closed Colles' fracture of left radius in [place] on 04/27/16. She relates her L wrist is still painful, swollen and lacks ROM with flexion. Pt wears wrist splint most of day.   Denies fever or chills. No shortness of breath or chest pain. Some stiffness of her left wrist and limited range of motion  HPI  Review of Systems   Objective: Vital Signs: BP 130/84   Pulse 85   Resp 16   Ht 5\' 8"  (1.727 m)   Wt 257 lb (116.6 kg)   BMI 39.08 kg/m   Physical Exam  Ortho Examleft wrist exam with well-healed volar incision. Full range of motion of fingers without swelling. Neurovascular exam intact. No thumb pain. Limited range of motion of left wrist with approximately 45 of supination  60 of pronation and approximately 20-30 of dorsiflexion and volar flexion of the wrist.   Specialty Comments:  No specialty comments available.  Imaging: No results found.   PMFS History: Patient Active Problem List   Diagnosis Date Noted  . Vitamin B12 deficiency 10/10/2014  . Neuropathic pain 04/04/2014  . Hyperlipidemia 03/12/2014  . Obesity (BMI 30-39.9) 01/08/2014  . Neuropathy due to chemotherapeutic drug ([place]) 07/18/2013  . Headache 05/16/2013  . History of lymphoma 03/20/2013  . Morbid obesity ([facility]) 08/08/2012  . Barrett's esophagus 08/10/2010  . Hemorrhoid 06/30/2010  . Heme positive stool 06/30/2010  . Depression 03/04/2009  . GERD 12/08/2007  . Anemia in neoplastic disease 12/08/2007  . Personal history of other diseases of digestive system 12/05/2007  . POSTMENOPAUSAL STATUS 11/21/2007  . ANXIETY 05/09/2007  . Pain in joint, lower leg 10/20/2006  . HERPES SIMPLEX, UNCOMPLICATED 59/16/3846  . ONYCHOMYCOSIS, TOENAILS 05/27/2006  . Essential hypertension 04/26/2006  . ASTHMA 04/26/2006  . HEADACHE 04/26/2006  . UMBILICAL HERNIORRHAPHY, HX OF 04/26/2006   Past Medical History:  Diagnosis Date  . Adenopathy    left axillary  . Anemia   . Anxiety   . Anxiety state, unspecified   . Asthma    no problem with asthma in several years  . Barrett esophagus   .  Depression   . Esophageal reflux   . Headache 05/16/2013  . Herpes simplex without mention of complication   . Hyperlipidemia 03/12/2014  . Hypertension   . 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 Mental disorder   . Neuropathic pain 04/04/2014  . Skin infection 11/02/2013  . Unspecified asthma(493.90)   . Unspecified essential hypertension   . URI (upper respiratory infection) 01/15/2014    Family History  Problem Relation Age of Onset  . Heart disease Mother   . Coronary artery disease      with hernia repair  . Migraines    . Hypertension    . Breast cancer Maternal Grandmother   . Cancer Maternal  Grandmother 50    breast  . Colon cancer Neg Hx     Past Surgical History:  Procedure Laterality Date  . ABDOMINAL HYSTERECTOMY  2006  . AXILLARY LYMPH NODE DISSECTION Left 03/09/2013   Procedure: EXCISION LEFT AXILLARY LYMPH NODES;  Surgeon: [name], MD;  [facility]: [facility];  Service: General;  Laterality: Left;  . CERVICAL CONIZATION W/BX    . CHOLECYSTECTOMY     with hernia repair  . KNEE SURGERY  02/2010   Right--arthroscopic  . NISSEN FUNDOPLICATION    . PORTACATH PLACEMENT N/A 03/23/2013   Procedure: INSERTION PORT-A-CATH;  Surgeon: [name], MD;  Location: [facility];  Service: General;  Laterality: N/A;  . TUBAL LIGATION     Social History   Occupational History  . [employer]   Social History Main Topics  . Smoking status: Never Smoker  . Smokeless tobacco: Never Used  . Alcohol use Yes     Comment: occasional  . Drug use: No  . Sexual activity: Not Currently    Partners: Male     Garald Balding, MD   Note - This record has been created using [employer].  Chart creation errors have been sought, but may not always  have been located. Such creation errors do not reflect on  the standard of medical care.

## 2016-06-10 ENCOUNTER — Encounter: Payer: Self-pay | Admitting: Family Medicine

## 2016-06-10 ENCOUNTER — Ambulatory Visit (INDEPENDENT_AMBULATORY_CARE_PROVIDER_SITE_OTHER): Payer: Managed Care, Other (non HMO) | Admitting: Family Medicine

## 2016-06-10 ENCOUNTER — Other Ambulatory Visit: Payer: Self-pay | Admitting: Family Medicine

## 2016-06-10 VITALS — BP 134/75 | HR 108 | Temp 100.5°F | Resp 16 | Ht 68.0 in | Wt 253.4 lb

## 2016-06-10 DIAGNOSIS — R05 Cough: Secondary | ICD-10-CM | POA: Diagnosis not present

## 2016-06-10 DIAGNOSIS — J40 Bronchitis, not specified as acute or chronic: Secondary | ICD-10-CM

## 2016-06-10 DIAGNOSIS — R059 Cough, unspecified: Secondary | ICD-10-CM

## 2016-06-10 DIAGNOSIS — F329 Major depressive disorder, single episode, unspecified: Secondary | ICD-10-CM

## 2016-06-10 DIAGNOSIS — R079 Chest pain, unspecified: Secondary | ICD-10-CM

## 2016-06-10 DIAGNOSIS — F32A Depression, unspecified: Secondary | ICD-10-CM

## 2016-06-10 MED ORDER — AZITHROMYCIN 250 MG PO TABS: ORAL_TABLET | ORAL | 0 refills | Status: DC

## 2016-06-10 MED ORDER — PREDNISONE 10 MG PO TABS: ORAL_TABLET | ORAL | 0 refills | Status: DC

## 2016-06-10 MED ORDER — ALBUTEROL SULFATE HFA 108 (90 BASE) MCG/ACT IN AERS: INHALATION_SPRAY | RESPIRATORY_TRACT | 2 refills | Status: DC

## 2016-06-10 MED ORDER — HYDROCHLOROTHIAZIDE 25 MG PO TABS: 25.0000 mg | ORAL_TABLET | Freq: Every day | ORAL | 2 refills | Status: DC

## 2016-06-10 MED ORDER — FLUOXETINE HCL 20 MG PO TABS: 20.0000 mg | ORAL_TABLET | Freq: Every day | ORAL | 2 refills | Status: DC

## 2016-06-10 MED ORDER — ALBUTEROL SULFATE (2.5 MG/3ML) 0.083% IN NEBU
2.5000 mg | INHALATION_SOLUTION | Freq: Once | RESPIRATORY_TRACT | Status: AC
  Administered 2016-06-10: 2.5 mg via RESPIRATORY_TRACT

## 2016-06-10 MED ORDER — PROMETHAZINE-DM 6.25-15 MG/5ML PO SYRP: 5.0000 mL | ORAL_SOLUTION | Freq: Four times a day (QID) | ORAL | 0 refills | Status: DC | PRN

## 2016-06-10 NOTE — Progress Notes (Signed)
Patient ID: Anna Jackson, female   DOB: [birth year], 56 y.o.   MRN: [id number]    Subjective:  I acted as a Education administrator for Dr. Carollee Herter.  Anna Jackson, Anna Jackson   Patient ID: Anna Jackson, female    DOB: 1960-04-06, 56 y.o.   MRN: [id number]  Chief Complaint  Patient presents with  . Cough    Cough  This is a new problem. Episode onset: tuesday but worse yesterday. The problem has been rapidly worsening. The problem occurs every few minutes. Associated symptoms include chills, a fever, headaches, shortness of breath and wheezing. Pertinent negatives include no chest pain, nasal congestion, postnasal drip, rash, rhinorrhea or sweats. She has tried OTC cough suppressant (robitussin dm) for the symptoms. The treatment provided no relief.    Patient is in today for cough.  She also c/o episode of cp last week. It only lasted a few min and then was gone. No congestion or wheezing with that episode.     Patient Care Team: Ann Held, DO as PCP - General Fanny Skates, MD as Consulting Physician (General Surgery) Heath Lark, MD as Consulting Physician (Hematology and Oncology)   Past Medical History:  Diagnosis Date  . Adenopathy    left axillary  . Anemia   . Anxiety   . Anxiety state, unspecified   . Asthma    no problem with asthma in several years  . Barrett esophagus   . Depression   . Esophageal reflux   . Headache 05/16/2013  . Herpes simplex without mention of complication   . Hyperlipidemia 03/12/2014  . Hypertension   . 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 Mental disorder   . Neuropathic pain 04/04/2014  . Skin infection 11/02/2013  . Unspecified asthma(493.90)   . Unspecified essential hypertension   . URI (upper respiratory infection) 01/15/2014    Past Surgical History:  Procedure Laterality Date  . ABDOMINAL HYSTERECTOMY  2006  . AXILLARY LYMPH NODE DISSECTION Left 03/09/2013   Procedure: EXCISION LEFT AXILLARY LYMPH NODES;  Surgeon: [name], MD;   Location: [facility];  Service: General;  Laterality: Left;  . CERVICAL CONIZATION W/BX    . CHOLECYSTECTOMY     with hernia repair  . KNEE SURGERY  02/2010   Right--arthroscopic  . NISSEN FUNDOPLICATION    . PORTACATH PLACEMENT N/A 03/23/2013   Procedure: INSERTION PORT-A-CATH;  Surgeon: [name], MD;  Location: [facility];  Service: General;  Laterality: N/A;  . TUBAL LIGATION      Family History  Problem Relation Age of Onset  . Heart disease Mother   . Coronary artery disease      with hernia repair  . Migraines    . Hypertension    . Breast cancer Maternal Grandmother   . Cancer Maternal Grandmother 50    breast  . Colon cancer Neg Hx     Social History   Social History  . Marital status: Single    Spouse name: N/A  . Number of children: 1  . Years of education: N/A   Occupational History  . [employer]   Social History Main Topics  . Smoking status: Never Smoker  . Smokeless tobacco: Never Used  . Alcohol use Yes     Comment: occasional  . Drug use: No  . Sexual activity: Not Currently    Partners: Male   Other Topics Concern  . Not on file   Social History Narrative   Exercise--  treadmill    Outpatient Medications Prior to Visit  Medication Sig Dispense Refill  . Cholecalciferol (VITAMIN D) 2000 units tablet Take 2,000 Units by mouth daily.    Marland Kitchen FLUoxetine (PROZAC) 20 MG tablet Take 1 tablet (20 mg total) by mouth daily. 90 tablet 2  . hydrochlorothiazide (HYDRODIURIL) 25 MG tablet Take 1 tablet (25 mg total) by mouth daily. 90 tablet 2  . omeprazole (PRILOSEC OTC) 20 MG tablet Take 1 tablet (20 mg total) by mouth daily as needed. 30 tablet 10  . valACYclovir (VALTREX) 1000 MG tablet Take 1 tablet (1,000 mg total) by mouth 3 (three) times daily. (Patient taking differently: Take 1,000 mg by mouth 3 (three) times daily as needed. ) 90 tablet 1  . vitamin B-12 (CYANOCOBALAMIN) 1000 MCG tablet Take 1,000 mcg by mouth daily.    Marland Kitchen  HYDROcodone-acetaminophen (NORCO) 7.5-325 MG tablet Take 1-2 tablets by mouth every 6 (six) hours as needed for moderate pain. 40 tablet 0  . lisinopril-hydrochlorothiazide (PRINZIDE,ZESTORETIC) 20-25 MG tablet 1 tab by mouth daily-- (Patient not taking: Reported on 05/04/2016) 90 tablet 1  . Lorcaserin HCl ER (BELVIQ XR) 20 MG TB24 Take 1 tablet by mouth daily. (Patient not taking: Reported on 05/04/2016) 30 tablet 2  . ondansetron (ZOFRAN ODT) 8 MG disintegrating tablet Take 1 tablet (8 mg total) by mouth every 8 (eight) hours as needed for nausea or vomiting. (Patient not taking: Reported on 05/19/2016) 20 tablet 0  . oseltamivir (TAMIFLU) 75 MG capsule Take 1 capsule (75 mg total) by mouth 2 (two) times daily. (Patient not taking: Reported on 05/04/2016) 10 capsule 0   No facility-administered medications prior to visit.     Allergies  Allergen Reactions  . Betadine [Povidone Iodine] Hives and Itching  . Codeine     REACTION: SICK-NAUSEATED    Review of Systems  Constitutional: Positive for chills, fever and malaise/fatigue.  HENT: Negative for congestion, postnasal drip and rhinorrhea.   Eyes: Negative for blurred vision.  Respiratory: Positive for cough, shortness of breath and wheezing.   Cardiovascular: Negative for chest pain, palpitations and leg swelling.  Gastrointestinal: Negative for vomiting.  Musculoskeletal: Positive for back pain.       Mid back  Skin: Negative for rash.  Neurological: Positive for headaches. Negative for loss of consciousness.       Objective:    Physical Exam  Constitutional: She is oriented to person, place, and time. She appears well-developed and well-nourished.  HENT:  Right Ear: External ear normal.  Left Ear: External ear normal.  + PND + errythema  Eyes: Conjunctivae are normal. Right eye exhibits no discharge. Left eye exhibits no discharge.  Cardiovascular: Normal rate, regular rhythm and normal heart sounds.   No murmur  heard. Pulmonary/Chest: Effort normal. No respiratory distress. She has decreased breath sounds. She has wheezes. She has no rales. She exhibits no tenderness.  Musculoskeletal: She exhibits no edema.  Lymphadenopathy:    She has cervical adenopathy.  Neurological: She is alert and oriented to person, place, and time.  Nursing note and vitals reviewed.   BP 134/75 (BP Location: Right Arm, Cuff Size: Large)   Pulse (!) 108   Temp (!) 100.5 F (38.1 C) (Oral)   Resp 16   Ht '5\' 8"'  (1.727 m)   Wt 253 lb 6.4 oz (114.9 kg)   SpO2 100%   BMI 38.53 kg/m  Wt Readings from Last 3 Encounters:  06/10/16 253 lb 6.4 oz (114.9 kg)  06/07/16 257  lb (116.6 kg)  05/19/16 257 lb (116.6 kg)   BP Readings from Last 3 Encounters:  06/10/16 134/75  06/07/16 130/84  05/19/16 (!) 144/93     Immunization History  Administered Date(s) Administered  . Influenza Split 11/23/2010  . Influenza Whole 11/21/2007, 12/22/2009, 11/16/2011, 11/09/2012  . Influenza, Seasonal, Injecte, Preservative Fre 12/11/2014  . Influenza,inj,Quad PF,36+ Mos 12/10/2013  . Influenza-Unspecified 12/10/2015  . Pneumococcal Conjugate-13 12/10/2013  . Pneumococcal Polysaccharide-23 02/07/2014  . Td 05/09/2001  . Tdap 11/23/2010    Health Maintenance  Topic Date Due  . Hepatitis C Screening  01/10/63  . HIV Screening  11/14/1977  . PAP SMEAR  03/28/2015  . INFLUENZA VACCINE  09/15/2016  . MAMMOGRAM  01/18/2017  . COLONOSCOPY  10/01/2020  . TETANUS/TDAP  11/22/2020    Lab Results  Component Value Date   WBC 7.6 01/13/2016   HGB 11.4 (L) 01/13/2016   HCT 34.8 (L) 01/13/2016   PLT 343.0 01/13/2016   GLUCOSE 94 02/17/2016   CHOL 178 02/17/2016   TRIG 143.0 02/17/2016   HDL 63.80 02/17/2016   LDLDIRECT 105.0 11/23/2010   LDLCALC 85 02/17/2016   ALT 19 02/17/2016   AST 18 02/17/2016   NA 138 02/17/2016   K 3.5 02/17/2016   CL 102 02/17/2016   CREATININE 0.75 02/17/2016   BUN 13 02/17/2016   CO2 29  02/17/2016   TSH 1.05 01/13/2016   INR 0.99 03/29/2013    Lab Results  Component Value Date   TSH 1.05 01/13/2016   Lab Results  Component Value Date   WBC 7.6 01/13/2016   HGB 11.4 (L) 01/13/2016   HCT 34.8 (L) 01/13/2016   MCV 83.7 01/13/2016   PLT 343.0 01/13/2016   Lab Results  Component Value Date   NA 138 02/17/2016   K 3.5 02/17/2016   CHLORIDE 104 12/22/2015   CO2 29 02/17/2016   GLUCOSE 94 02/17/2016   BUN 13 02/17/2016   CREATININE 0.75 02/17/2016   BILITOT 0.4 02/17/2016   ALKPHOS 121 (H) 02/17/2016   AST 18 02/17/2016   ALT 19 02/17/2016   PROT 6.6 02/17/2016   ALBUMIN 3.8 02/17/2016   CALCIUM 9.7 02/17/2016   ANIONGAP 9 12/22/2015   EGFR 88 (L) 12/22/2015   GFR 85.83 02/17/2016   Lab Results  Component Value Date   CHOL 178 02/17/2016   Lab Results  Component Value Date   HDL 63.80 02/17/2016   Lab Results  Component Value Date   LDLCALC 85 02/17/2016   Lab Results  Component Value Date   TRIG 143.0 02/17/2016   Lab Results  Component Value Date   CHOLHDL 3 02/17/2016   No results found for: HGBA1C       Assessment & Plan:   Problem List Items Addressed This Visit    None    Visit Diagnoses    Bronchitis    -  Primary   Relevant Medications   albuterol (PROAIR HFA) 108 (90 Base) MCG/ACT inhaler   azithromycin (ZITHROMAX Z-PAK) 250 MG tablet   Other Relevant Orders   DG Chest 2 View   Cough       Relevant Medications   albuterol (PROVENTIL) (2.5 MG/3ML) 0.083% nebulizer solution 2.5 mg   predniSONE (DELTASONE) 10 MG tablet   azithromycin (ZITHROMAX Z-PAK) 250 MG tablet   promethazine-dextromethorphan (PROMETHAZINE-DM) 6.25-15 MG/5ML syrup   Other Relevant Orders   DG Chest 2 View   Chest pain, unspecified type       Relevant  Orders   EKG 12-Lead (Completed)      I have discontinued Ms. Puffenbarger's lisinopril-hydrochlorothiazide, Lorcaserin HCl ER, oseltamivir, ondansetron, and HYDROcodone-acetaminophen. I am also  having her start on predniSONE, albuterol, azithromycin, and promethazine-dextromethorphan. Additionally, I am having her maintain her omeprazole, vitamin B-12, valACYclovir, Vitamin D, hydrochlorothiazide, and FLUoxetine. We will continue to administer albuterol.  Meds ordered this encounter  Medications  . albuterol (PROVENTIL) (2.5 MG/3ML) 0.083% nebulizer solution 2.5 mg  . predniSONE (DELTASONE) 10 MG tablet    Sig: TAKE 3 TABLETS PO QD FOR 3 DAYS THEN TAKE 2 TABLETS PO QD FOR 3 DAYS THEN TAKE 1 TABLET PO QD FOR 3 DAYS THEN TAKE 1/2 TAB PO QD FOR 3 DAYS    Dispense:  20 tablet    Refill:  0  . albuterol (PROAIR HFA) 108 (90 Base) MCG/ACT inhaler    Sig: 2 puffs q6h prn    Dispense:  1 Inhaler    Refill:  2  . azithromycin (ZITHROMAX Z-PAK) 250 MG tablet    Sig: As directed    Dispense:  6 each    Refill:  0  . promethazine-dextromethorphan (PROMETHAZINE-DM) 6.25-15 MG/5ML syrup    Sig: Take 5 mLs by mouth 4 (four) times daily as needed.    Dispense:  118 mL    Refill:  0    CMA served as scribe during this visit. History, Physical and Plan performed by medical provider. Documentation and orders reviewed and attested to.  Ann Held, DO

## 2016-06-10 NOTE — Patient Instructions (Signed)

## 2016-06-10 NOTE — Progress Notes (Signed)
Pre visit review using our clinic review tool, if applicable. No additional management support is needed unless otherwise documented below in the visit note. 

## 2016-06-14 ENCOUNTER — Encounter: Payer: Self-pay | Admitting: Family Medicine

## 2016-06-14 ENCOUNTER — Ambulatory Visit (INDEPENDENT_AMBULATORY_CARE_PROVIDER_SITE_OTHER): Payer: Managed Care, Other (non HMO) | Admitting: Family Medicine

## 2016-06-14 ENCOUNTER — Ambulatory Visit (HOSPITAL_BASED_OUTPATIENT_CLINIC_OR_DEPARTMENT_OTHER)
Admission: RE | Admit: 2016-06-14 | Discharge: 2016-06-14 | Disposition: A | Discharge status: Home / Self Care | Payer: Managed Care, Other (non HMO) | Source: Ambulatory Visit | Attending: Family Medicine | Admitting: Family Medicine

## 2016-06-14 VITALS — BP 127/67 | HR 90 | Temp 98.2°F | Resp 16 | Ht 68.0 in | Wt 251.8 lb

## 2016-06-14 DIAGNOSIS — R059 Cough, unspecified: Secondary | ICD-10-CM

## 2016-06-14 DIAGNOSIS — R05 Cough: Secondary | ICD-10-CM | POA: Insufficient documentation

## 2016-06-14 DIAGNOSIS — J209 Acute bronchitis, unspecified: Secondary | ICD-10-CM | POA: Insufficient documentation

## 2016-06-14 DIAGNOSIS — K449 Diaphragmatic hernia without obstruction or gangrene: Secondary | ICD-10-CM | POA: Diagnosis not present

## 2016-06-14 MED ORDER — ALBUTEROL SULFATE (2.5 MG/3ML) 0.083% IN NEBU
2.5000 mg | INHALATION_SOLUTION | Freq: Once | RESPIRATORY_TRACT | Status: AC
  Administered 2016-06-14: 2.5 mg via RESPIRATORY_TRACT

## 2016-06-14 MED ORDER — PROMETHAZINE-DM 6.25-15 MG/5ML PO SYRP: 5.0000 mL | ORAL_SOLUTION | Freq: Four times a day (QID) | ORAL | 0 refills | Status: DC | PRN

## 2016-06-14 MED ORDER — LEVOFLOXACIN 500 MG PO TABS: 500.0000 mg | ORAL_TABLET | Freq: Every day | ORAL | 0 refills | Status: DC

## 2016-06-14 MED ORDER — BECLOMETHASONE DIPROPIONATE 40 MCG/ACT IN AERS: 2.0000 | INHALATION_SPRAY | Freq: Two times a day (BID) | RESPIRATORY_TRACT | 12 refills | Status: DC

## 2016-06-14 NOTE — Progress Notes (Signed)
Pre visit review using our clinic review tool, if applicable. No additional management support is needed unless otherwise documented below in the visit note. 

## 2016-06-14 NOTE — Progress Notes (Signed)
Patient ID: Anna Jackson, female   DOB: Jul 07, 1960, 56 y.o.   MRN: [id number]     Subjective:  I acted as a Education administrator for Dr. Carollee Herter.  [name], [place]   Patient ID: Anna Jackson, female    DOB: Jan 16, 1961, 56 y.o.   MRN: [id number]  Chief Complaint  Patient presents with  . Cough    follow up.  cough is worse feels like it is all in her head, runny nose.    HPI  Patient is in today for follow up cough.  She states that the cough is worse and it feels like it is all in her head.  She now has a runny nose.  She has finished antibiotic and cough syrup.  She says the cough syrup does not work.  Patient Care Team: Ann Held, DO as PCP - General Fanny Skates, MD as Consulting Physician (General Surgery) Heath Lark, MD as Consulting Physician (Hematology and Oncology)   Past Medical History:  Diagnosis Date  . Adenopathy    left axillary  . Anemia   . Anxiety   . Anxiety state, unspecified   . Asthma    no problem with asthma in several years  . Barrett esophagus   . Depression   . Esophageal reflux   . Headache 05/16/2013  . Herpes simplex without mention of complication   . Hyperlipidemia 03/12/2014  . Hypertension   . 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 Mental disorder   . Neuropathic pain 04/04/2014  . Skin infection 11/02/2013  . Unspecified asthma(493.90)   . Unspecified essential hypertension   . URI (upper respiratory infection) 01/15/2014    Past Surgical History:  Procedure Laterality Date  . ABDOMINAL HYSTERECTOMY  2006  . AXILLARY LYMPH NODE DISSECTION Left 03/09/2013   Procedure: EXCISION LEFT AXILLARY LYMPH NODES;  Surgeon: [name], MD;  Location: [facility];  Service: General;  Laterality: Left;  . CERVICAL CONIZATION W/BX    . CHOLECYSTECTOMY     with hernia repair  . KNEE SURGERY  02/2010   Right--arthroscopic  . NISSEN FUNDOPLICATION    . PORTACATH PLACEMENT N/A 03/23/2013   Procedure: INSERTION PORT-A-CATH;  Surgeon: [name], MD;  Location: [facility];  Service: General;  Laterality: N/A;  . TUBAL LIGATION      Family History  Problem Relation Age of Onset  . Heart disease Mother   . Coronary artery disease      with hernia repair  . Migraines    . Hypertension    . Breast cancer Maternal Grandmother   . Cancer Maternal Grandmother 50    breast  . Colon cancer Neg Hx     Social History   Social History  . Marital status: Single    Spouse name: N/A  . Number of children: 1  . Years of education: N/A   Occupational History  . [employer]   Social History Main Topics  . Smoking status: Never Smoker  . Smokeless tobacco: Never Used  . Alcohol use Yes     Comment: occasional  . Drug use: No  . Sexual activity: Not Currently    Partners: Male   Other Topics Concern  . Not on file   Social History Narrative   Exercise--  treadmill    Outpatient Medications Prior to Visit  Medication Sig Dispense Refill  . albuterol (PROAIR HFA) 108 (90 Base) MCG/ACT inhaler 2 puffs q6h prn 1 Inhaler 2  .  Cholecalciferol (VITAMIN D) 2000 units tablet Take 2,000 Units by mouth daily.    Marland Kitchen FLUoxetine (PROZAC) 20 MG tablet Take 1 tablet (20 mg total) by mouth daily. 90 tablet 2  . hydrochlorothiazide (HYDRODIURIL) 25 MG tablet Take 1 tablet (25 mg total) by mouth daily. 90 tablet 2  . omeprazole (PRILOSEC OTC) 20 MG tablet Take 1 tablet (20 mg total) by mouth daily as needed. 30 tablet 10  . predniSONE (DELTASONE) 10 MG tablet TAKE 3 TABLETS PO QD FOR 3 DAYS THEN TAKE 2 TABLETS PO QD FOR 3 DAYS THEN TAKE 1 TABLET PO QD FOR 3 DAYS THEN TAKE 1/2 TAB PO QD FOR 3 DAYS 20 tablet 0  . valACYclovir (VALTREX) 1000 MG tablet Take 1 tablet (1,000 mg total) by mouth 3 (three) times daily. (Patient taking differently: Take 1,000 mg by mouth 3 (three) times daily as needed. ) 90 tablet 1  . vitamin B-12 (CYANOCOBALAMIN) 1000 MCG tablet Take 1,000 mcg by mouth daily.    Marland Kitchen azithromycin (ZITHROMAX Z-PAK) 250  MG tablet As directed 6 each 0  . promethazine-dextromethorphan (PROMETHAZINE-DM) 6.25-15 MG/5ML syrup Take 5 mLs by mouth 4 (four) times daily as needed. 118 mL 0   No facility-administered medications prior to visit.     Allergies  Allergen Reactions  . Betadine [Povidone Iodine] Hives and Itching  . Codeine     REACTION: SICK-NAUSEATED    Review of Systems  Constitutional: Negative for fever and malaise/fatigue.  HENT: Negative for congestion, sinus pain and sore throat.   Eyes: Negative for blurred vision.  Respiratory: Positive for cough and wheezing. Negative for shortness of breath.   Cardiovascular: Negative for chest pain, palpitations and leg swelling.  Gastrointestinal: Negative for vomiting.  Musculoskeletal: Negative for back pain.  Skin: Negative for rash.  Neurological: Negative for loss of consciousness and headaches.       Objective:    Physical Exam  Constitutional: She is oriented to person, place, and time. She appears well-developed and well-nourished. No distress.  HENT:  Head: Normocephalic and atraumatic.  Eyes: Conjunctivae are normal.  Neck: Normal range of motion. No thyromegaly present.  Cardiovascular: Normal rate and regular rhythm.   Pulmonary/Chest: Effort normal. No respiratory distress. She has wheezes. She has no rales. She exhibits no tenderness.  Abdominal: Soft. Bowel sounds are normal. There is no tenderness.  Musculoskeletal: Normal range of motion. She exhibits no edema or deformity.  Neurological: She is alert and oriented to person, place, and time.  Skin: Skin is warm and dry. She is not diaphoretic.  Psychiatric: She has a normal mood and affect.  Nursing note and vitals reviewed.   BP 127/67 (BP Location: Right Arm, Cuff Size: Large)   Pulse 90   Temp 98.2 F (36.8 C) (Oral)   Resp 16   Ht _0  (1.727 m)   Wt 251 lb 12.8 oz (114.2 kg)   SpO2 99%   BMI 38.29 kg/m  Wt Readings from Last 3 Encounters:  06/14/16 251 lb  12.8 oz (114.2 kg)  06/10/16 253 lb 6.4 oz (114.9 kg)  06/07/16 257 lb (116.6 kg)   BP Readings from Last 3 Encounters:  06/14/16 127/67  06/10/16 134/75  06/07/16 130/84     Immunization History  Administered Date(s) Administered  . Influenza Split 11/23/2010  . Influenza Whole 11/21/2007, 12/22/2009, 11/16/2011, 11/09/2012  . Influenza, Seasonal, Injecte, Preservative Fre 12/11/2014  . Influenza,inj,Quad PF,36+ Mos 12/10/2013  . Influenza-Unspecified 12/10/2015  . Pneumococcal Conjugate-13 12/10/2013  .  Pneumococcal Polysaccharide-23 02/07/2014  . Td 05/09/2001  . Tdap 11/23/2010    Health Maintenance  Topic Date Due  . Hepatitis C Screening  [birth year]  . HIV Screening  11/14/1977  . PAP SMEAR  03/28/2015  . INFLUENZA VACCINE  09/15/2016  . MAMMOGRAM  01/18/2017  . COLONOSCOPY  10/01/2020  . TETANUS/TDAP  11/22/2020    Lab Results  Component Value Date   WBC 7.6 01/13/2016   HGB 11.4 (L) 01/13/2016   HCT 34.8 (L) 01/13/2016   PLT 343.0 01/13/2016   GLUCOSE 94 02/17/2016   CHOL 178 02/17/2016   TRIG 143.0 02/17/2016   HDL 63.80 02/17/2016   LDLDIRECT 105.0 11/23/2010   LDLCALC 85 02/17/2016   ALT 19 02/17/2016   AST 18 02/17/2016   NA 138 02/17/2016   K 3.5 02/17/2016   CL 102 02/17/2016   CREATININE 0.75 02/17/2016   BUN 13 02/17/2016   CO2 29 02/17/2016   TSH 1.05 01/13/2016   INR 0.99 03/29/2013    Lab Results  Component Value Date   TSH 1.05 01/13/2016   Lab Results  Component Value Date   WBC 7.6 01/13/2016   HGB 11.4 (L) 01/13/2016   HCT 34.8 (L) 01/13/2016   MCV 83.7 01/13/2016   PLT 343.0 01/13/2016   Lab Results  Component Value Date   NA 138 02/17/2016   K 3.5 02/17/2016   CHLORIDE 104 12/22/2015   CO2 29 02/17/2016   GLUCOSE 94 02/17/2016   BUN 13 02/17/2016   CREATININE 0.75 02/17/2016   BILITOT 0.4 02/17/2016   ALKPHOS 121 (H) 02/17/2016   AST 18 02/17/2016   ALT 19 02/17/2016   PROT 6.6 02/17/2016   ALBUMIN 3.8  02/17/2016   CALCIUM 9.7 02/17/2016   ANIONGAP 9 12/22/2015   EGFR 88 (L) 12/22/2015   GFR 85.83 02/17/2016   Lab Results  Component Value Date   CHOL 178 02/17/2016   Lab Results  Component Value Date   HDL 63.80 02/17/2016   Lab Results  Component Value Date   LDLCALC 85 02/17/2016   Lab Results  Component Value Date   TRIG 143.0 02/17/2016   Lab Results  Component Value Date   CHOLHDL 3 02/17/2016   No results found for: HGBA1C       Assessment & Plan:   Problem List Items Addressed This Visit      Unprioritized   Acute bronchitis - Primary    Lungs improved after neb with albuterol con't proair Finish pred taper  Add qvar 2 puffs bid Add levaquin per orders Phenergan dm for cough Get cxr today rto prn       Relevant Medications   levofloxacin (LEVAQUIN) 500 MG tablet   beclomethasone (QVAR) 40 MCG/ACT inhaler    Other Visit Diagnoses    Cough       Relevant Medications   albuterol (PROVENTIL) (2.5 MG/3ML) 0.083% nebulizer solution 2.5 mg (Completed)   promethazine-dextromethorphan (PROMETHAZINE-DM) 6.25-15 MG/5ML syrup   Other Relevant Orders   DG Chest 2 View (Completed)      I have discontinued Ms. Kujala's azithromycin and promethazine-dextromethorphan. I am also having her start on levofloxacin, beclomethasone, and promethazine-dextromethorphan. Additionally, I am having her maintain her omeprazole, vitamin B-12, valACYclovir, Vitamin D, hydrochlorothiazide, FLUoxetine, predniSONE, and albuterol. We administered albuterol.  Meds ordered this encounter  Medications  . albuterol (PROVENTIL) (2.5 MG/3ML) 0.083% nebulizer solution 2.5 mg  . levofloxacin (LEVAQUIN) 500 MG tablet    Sig: Take 1 tablet (500 mg  total) by mouth daily.    Dispense:  7 tablet    Refill:  0  . beclomethasone (QVAR) 40 MCG/ACT inhaler    Sig: Inhale 2 puffs into the lungs 2 (two) times daily.    Dispense:  1 Inhaler    Refill:  12  .  promethazine-dextromethorphan (PROMETHAZINE-DM) 6.25-15 MG/5ML syrup    Sig: Take 5 mLs by mouth 4 (four) times daily as needed.    Dispense:  118 mL    Refill:  0    CMA served as scribe during this visit. History, Physical and Plan performed by medical provider. Documentation and orders reviewed and attested to.  Ann Held, DO

## 2016-06-14 NOTE — Patient Instructions (Signed)

## 2016-06-14 NOTE — Assessment & Plan Note (Signed)
Lungs improved after neb with albuterol con't proair Finish pred taper  Add qvar 2 puffs bid Add levaquin per orders Phenergan dm for cough Get cxr today rto prn

## 2016-06-17 ENCOUNTER — Telehealth: Payer: Self-pay | Admitting: Family Medicine

## 2016-06-17 ENCOUNTER — Ambulatory Visit (INDEPENDENT_AMBULATORY_CARE_PROVIDER_SITE_OTHER): Payer: Managed Care, Other (non HMO) | Admitting: Family Medicine

## 2016-06-17 ENCOUNTER — Encounter: Payer: Self-pay | Admitting: Family Medicine

## 2016-06-17 VITALS — BP 108/71 | HR 108 | Temp 98.2°F | Resp 16 | Ht 68.0 in | Wt 251.2 lb

## 2016-06-17 DIAGNOSIS — J069 Acute upper respiratory infection, unspecified: Secondary | ICD-10-CM

## 2016-06-17 DIAGNOSIS — J40 Bronchitis, not specified as acute or chronic: Secondary | ICD-10-CM

## 2016-06-17 MED ORDER — PREDNISONE 10 MG PO TABS: ORAL_TABLET | ORAL | 0 refills | Status: DC

## 2016-06-17 MED ORDER — FLUTICASONE PROPIONATE 50 MCG/ACT NA SUSP: 2.0000 | Freq: Every day | NASAL | 6 refills | Status: DC

## 2016-06-17 NOTE — Progress Notes (Signed)
Patient ID: Anna Jackson, female   DOB: 1960/10/26, 56 y.o.   MRN: [id number]     Subjective:    Patient ID: Anna Jackson, female    DOB: [birth year], 56 y.o.   MRN: [id number]  Chief Complaint  Patient presents with  . Cough    no better, stills feels in chest. feels like breathing is better    HPI Patient is in today for follow up cough.  She states that her cough is no better, she stills feels congestion in her chest, and that her breathing is better. Pt was taking the prednisone wrong.  Apparently it was written on the bottle wrong and she took 3 tab 3x a day for 3 days  Breathing has improved but pt still does not feel good.  Past Medical History:  Diagnosis Date  . Adenopathy    left axillary  . Anemia   . Anxiety   . Anxiety state, unspecified   . Asthma    no problem with asthma in several years  . Barrett esophagus   . Depression   . Esophageal reflux   . Headache 05/16/2013  . Herpes simplex without mention of complication   . Hyperlipidemia 03/12/2014  . Hypertension   . 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 Mental disorder   . Neuropathic pain 04/04/2014  . Skin infection 11/02/2013  . Unspecified asthma(493.90)   . Unspecified essential hypertension   . URI (upper respiratory infection) 01/15/2014    Past Surgical History:  Procedure Laterality Date  . ABDOMINAL HYSTERECTOMY  2006  . AXILLARY LYMPH NODE DISSECTION Left 03/09/2013   Procedure: EXCISION LEFT AXILLARY LYMPH NODES;  Surgeon: [name], MD;  Location: [facility];  Service: General;  Laterality: Left;  . CERVICAL CONIZATION W/BX    . CHOLECYSTECTOMY     with hernia repair  . KNEE SURGERY  02/2010   Right--arthroscopic  . NISSEN FUNDOPLICATION    . PORTACATH PLACEMENT N/A 03/23/2013   Procedure: INSERTION PORT-A-CATH;  Surgeon: [name], MD;  Location: [facility];  Service: General;  Laterality: N/A;  . TUBAL LIGATION      Family History  Problem Relation Age of Onset  . Heart  disease Mother   . Coronary artery disease      with hernia repair  . Migraines    . Hypertension    . Breast cancer Maternal Grandmother   . Cancer Maternal Grandmother 50    breast  . Colon cancer Neg Hx     Social History   Social History  . Marital status: Single    Spouse name: N/A  . Number of children: 1  . Years of education: N/A   Occupational History  . [employer]   Social History Main Topics  . Smoking status: Never Smoker  . Smokeless tobacco: Never Used  . Alcohol use Yes     Comment: occasional  . Drug use: No  . Sexual activity: Not Currently    Partners: Male   Other Topics Concern  . Not on file   Social History Narrative   Exercise--  treadmill    Outpatient Medications Prior to Visit  Medication Sig Dispense Refill  . albuterol (PROAIR HFA) 108 (90 Base) MCG/ACT inhaler 2 puffs q6h prn 1 Inhaler 2  . beclomethasone (QVAR) 40 MCG/ACT inhaler Inhale 2 puffs into the lungs 2 (two) times daily. 1 Inhaler 12  . Cholecalciferol (VITAMIN D) 2000 units tablet Take 2,000 Units by mouth  daily.    Marland Kitchen FLUoxetine (PROZAC) 20 MG tablet Take 1 tablet (20 mg total) by mouth daily. 90 tablet 2  . hydrochlorothiazide (HYDRODIURIL) 25 MG tablet Take 1 tablet (25 mg total) by mouth daily. 90 tablet 2  . levofloxacin (LEVAQUIN) 500 MG tablet Take 1 tablet (500 mg total) by mouth daily. 7 tablet 0  . omeprazole (PRILOSEC OTC) 20 MG tablet Take 1 tablet (20 mg total) by mouth daily as needed. 30 tablet 10  . promethazine-dextromethorphan (PROMETHAZINE-DM) 6.25-15 MG/5ML syrup Take 5 mLs by mouth 4 (four) times daily as needed. 118 mL 0  . valACYclovir (VALTREX) 1000 MG tablet Take 1 tablet (1,000 mg total) by mouth 3 (three) times daily. (Patient taking differently: Take 1,000 mg by mouth 3 (three) times daily as needed. ) 90 tablet 1  . vitamin B-12 (CYANOCOBALAMIN) 1000 MCG tablet Take 1,000 mcg by mouth daily.    . predniSONE (DELTASONE) 10 MG tablet  TAKE 3 TABLETS PO QD FOR 3 DAYS THEN TAKE 2 TABLETS PO QD FOR 3 DAYS THEN TAKE 1 TABLET PO QD FOR 3 DAYS THEN TAKE 1/2 TAB PO QD FOR 3 DAYS (Patient not taking: Reported on 06/17/2016) 20 tablet 0   No facility-administered medications prior to visit.     Allergies  Allergen Reactions  . Betadine [Povidone Iodine] Hives and Itching  . Codeine     REACTION: SICK-NAUSEATED    Review of Systems  Constitutional: Negative for fever and malaise/fatigue.  HENT: Negative for congestion.   Eyes: Negative for blurred vision.  Respiratory: Positive for cough. Negative for shortness of breath.   Cardiovascular: Negative for chest pain, palpitations and leg swelling.  Gastrointestinal: Negative for vomiting.  Musculoskeletal: Negative for back pain.  Skin: Negative for rash.  Neurological: Negative for loss of consciousness and headaches.       Objective:    Physical Exam  Constitutional: She is oriented to person, place, and time. She appears well-developed and well-nourished. No distress.  HENT:  Head: Normocephalic and atraumatic.  Eyes: Conjunctivae are normal.  Neck: Normal range of motion. No thyromegaly present.  Cardiovascular: Normal rate and regular rhythm.   Pulmonary/Chest: Effort normal and breath sounds normal. She has no wheezes.  Abdominal: Soft. Bowel sounds are normal. There is no tenderness.  Musculoskeletal: Normal range of motion. She exhibits no edema or deformity.  Neurological: She is alert and oriented to person, place, and time.  Skin: Skin is warm and dry. She is not diaphoretic.  Psychiatric: She has a normal mood and affect.    BP 108/71 (BP Location: Right Arm, Cuff Size: Large)   Pulse (!) 108   Temp 98.2 F (36.8 C) (Oral)   Resp 16   Ht '5\' 8"'  (1.727 m)   Wt 251 lb 3.2 oz (113.9 kg)   SpO2 99%   BMI 38.19 kg/m  Wt Readings from Last 3 Encounters:  06/17/16 251 lb 3.2 oz (113.9 kg)  06/14/16 251 lb 12.8 oz (114.2 kg)  06/10/16 253 lb 6.4 oz  (114.9 kg)     Lab Results  Component Value Date   WBC 7.6 01/13/2016   HGB 11.4 (L) 01/13/2016   HCT 34.8 (L) 01/13/2016   PLT 343.0 01/13/2016   GLUCOSE 94 02/17/2016   CHOL 178 02/17/2016   TRIG 143.0 02/17/2016   HDL 63.80 02/17/2016   LDLDIRECT 105.0 11/23/2010   LDLCALC 85 02/17/2016   ALT 19 02/17/2016   AST 18 02/17/2016   NA 138 02/17/2016  K 3.5 02/17/2016   CL 102 02/17/2016   CREATININE 0.75 02/17/2016   BUN 13 02/17/2016   CO2 29 02/17/2016   TSH 1.05 01/13/2016   INR 0.99 03/29/2013    Lab Results  Component Value Date   TSH 1.05 01/13/2016   Lab Results  Component Value Date   WBC 7.6 01/13/2016   HGB 11.4 (L) 01/13/2016   HCT 34.8 (L) 01/13/2016   MCV 83.7 01/13/2016   PLT 343.0 01/13/2016   Lab Results  Component Value Date   NA 138 02/17/2016   K 3.5 02/17/2016   CHLORIDE 104 12/22/2015   CO2 29 02/17/2016   GLUCOSE 94 02/17/2016   BUN 13 02/17/2016   CREATININE 0.75 02/17/2016   BILITOT 0.4 02/17/2016   ALKPHOS 121 (H) 02/17/2016   AST 18 02/17/2016   ALT 19 02/17/2016   PROT 6.6 02/17/2016   ALBUMIN 3.8 02/17/2016   CALCIUM 9.7 02/17/2016   ANIONGAP 9 12/22/2015   EGFR 88 (L) 12/22/2015   GFR 85.83 02/17/2016   Lab Results  Component Value Date   CHOL 178 02/17/2016   Lab Results  Component Value Date   HDL 63.80 02/17/2016   Lab Results  Component Value Date   LDLCALC 85 02/17/2016   Lab Results  Component Value Date   TRIG 143.0 02/17/2016   Lab Results  Component Value Date   CHOLHDL 3 02/17/2016   No results found for: HGBA1C     Assessment & Plan:   Problem List Items Addressed This Visit    None    Visit Diagnoses    Upper respiratory tract infection, unspecified type    -  Primary   Relevant Medications   fluticasone (FLONASE) 50 MCG/ACT nasal spray   predniSONE (DELTASONE) 10 MG tablet   Bronchitis       Relevant Medications   predniSONE (DELTASONE) 10 MG tablet    finish levaquin Use  inhalers pred per orders Pt understans cough could last 6-8 weeks  rto prn   I have discontinued Ms. Downs's predniSONE. I am also having her start on fluticasone and predniSONE. Additionally, I am having her maintain her omeprazole, vitamin B-12, valACYclovir, Vitamin D, hydrochlorothiazide, FLUoxetine, albuterol, levofloxacin, beclomethasone, and promethazine-dextromethorphan.  Meds ordered this encounter  Medications  . fluticasone (FLONASE) 50 MCG/ACT nasal spray    Sig: Place 2 sprays into both nostrils daily.    Dispense:  16 g    Refill:  6  . predniSONE (DELTASONE) 10 MG tablet    Sig: 2 po tid x 3 days then 1 po tid x 3 days then 1/2 tab po tid x 3 days then stop    Dispense:  25 tablet    Refill:  0     Ann Held, DO

## 2016-06-17 NOTE — Telephone Encounter (Signed)
Caller name: Vicente Males  Relation to pt: [facility]  Call back number:[phone] Pharmacy: [id number] - [place], [place]  Reason for call:  [facility] in need of clarifying predniSONE (DELTASONE) 10 MG tablet as per pharmacy quantity should be 32 instead of 25, pharmacy requesting verbal, please advise

## 2016-06-17 NOTE — Progress Notes (Signed)
Pre visit review using our clinic review tool, if applicable. No additional management support is needed unless otherwise documented below in the visit note. 

## 2016-06-17 NOTE — Telephone Encounter (Signed)
PCP stated #32 is correct/pharmacy informed

## 2016-06-17 NOTE — Patient Instructions (Addendum)
Prednisone 10 mg 2 tab 3x a day for 3 days then 1 tab 3x a day for 3 days then 1/2 tab 3x a day for 3 days then stop  acute Bronchitis, Adult Acute bronchitis is sudden (acute) swelling of the air tubes (bronchi) in the lungs. Acute bronchitis causes these tubes to fill with mucus, which can make it hard to breathe. It can also cause coughing or wheezing. In adults, acute bronchitis usually goes away within 2 weeks. A cough caused by bronchitis may last up to 3 weeks. Smoking, allergies, and asthma can make the condition worse. Repeated episodes of bronchitis may cause further lung problems, such as chronic obstructive pulmonary disease (COPD). What are the causes? This condition can be caused by germs and by substances that irritate the lungs, including:  Cold and flu viruses. This condition is most often caused by the same virus that causes a cold.  Bacteria.  Exposure to tobacco smoke, dust, fumes, and air pollution. What increases the risk? This condition is more likely to develop in people who:  Have close contact with someone with acute bronchitis.  Are exposed to lung irritants, such as tobacco smoke, dust, fumes, and vapors.  Have a weak immune system.  Have a respiratory condition such as asthma. What are the signs or symptoms? Symptoms of this condition include:  A cough.  Coughing up clear, yellow, or green mucus.  Wheezing.  Chest congestion.  Shortness of breath.  A fever.  Body aches.  Chills.  A sore throat. How is this diagnosed? This condition is usually diagnosed with a physical exam. During the exam, your health care provider may order tests, such as chest X-rays, to rule out other conditions. He or she may also:  Test a sample of your mucus for bacterial infection.  Check the level of oxygen in your blood. This is done to check for pneumonia.  Do a chest X-ray or lung function testing to rule out pneumonia and other conditions.  Perform  blood tests. Your health care provider will also ask about your symptoms and medical history. How is this treated? Most cases of acute bronchitis clear up over time without treatment. Your health care provider may recommend:  Drinking more fluids. Drinking more makes your mucus thinner, which may make it easier to breathe.  Taking a medicine for a fever or cough.  Taking an antibiotic medicine.  Using an inhaler to help improve shortness of breath and to control a cough.  Using a cool mist vaporizer or humidifier to make it easier to breathe. Follow these instructions at home: Medicines   Take over-the-counter and prescription medicines only as told by your health care provider.  If you were prescribed an antibiotic, take it as told by your health care provider. Do not stop taking the antibiotic even if you start to feel better. General instructions   Get plenty of rest.  Drink enough fluids to keep your urine clear or pale yellow.  Avoid smoking and secondhand smoke. Exposure to cigarette smoke or irritating chemicals will make bronchitis worse. If you smoke and you need help quitting, ask your health care provider. Quitting smoking will help your lungs heal faster.  Use an inhaler, cool mist vaporizer, or humidifier as told by your health care provider.  Keep all follow-up visits as told by your health care provider. This is important. How is this prevented? To lower your risk of getting this condition again:  Wash your hands often  with soap and water. If soap and water are not available, use hand sanitizer.  Avoid contact with people who have cold symptoms.  Try not to touch your hands to your mouth, nose, or eyes.  Make sure to get the flu shot every year. Contact a health care provider if:  Your symptoms do not improve in 2 weeks of treatment. Get help right away if:  You cough up blood.  You have chest pain.  You have severe shortness of breath.  You become  dehydrated.  You faint or keep feeling like you are going to faint.  You keep vomiting.  You have a severe headache.  Your fever or chills gets worse. This information is not intended to replace advice given to you by your health care provider. Make sure you discuss any questions you have with your health care provider. Document Released: 03/11/2004 Document Revised: 08/27/2015 Document Reviewed: 07/23/2015 Elsevier Interactive Patient Education  2017 [employer].

## 2016-06-17 NOTE — Telephone Encounter (Signed)
ok 

## 2016-06-17 NOTE — Telephone Encounter (Signed)
Advise if correct and then I will verbally let pharmacy know ok

## 2016-06-21 ENCOUNTER — Other Ambulatory Visit: Payer: Self-pay | Admitting: Family Medicine

## 2016-06-21 ENCOUNTER — Other Ambulatory Visit: Payer: Managed Care, Other (non HMO)

## 2016-06-21 ENCOUNTER — Encounter: Payer: Managed Care, Other (non HMO) | Admitting: Adult Health

## 2016-06-21 ENCOUNTER — Encounter: Payer: Self-pay | Admitting: Adult Health

## 2016-06-21 ENCOUNTER — Other Ambulatory Visit (HOSPITAL_BASED_OUTPATIENT_CLINIC_OR_DEPARTMENT_OTHER): Payer: Managed Care, Other (non HMO)

## 2016-06-21 ENCOUNTER — Ambulatory Visit (HOSPITAL_BASED_OUTPATIENT_CLINIC_OR_DEPARTMENT_OTHER): Payer: Managed Care, Other (non HMO) | Admitting: Adult Health

## 2016-06-21 VITALS — BP 145/82 | HR 103 | Temp 98.4°F | Resp 18 | Wt 255.1 lb

## 2016-06-21 DIAGNOSIS — Z8579 Personal history of other malignant neoplasms of lymphoid, hematopoietic and related tissues: Secondary | ICD-10-CM

## 2016-06-21 DIAGNOSIS — C8590 Non-Hodgkin lymphoma, unspecified, unspecified site: Secondary | ICD-10-CM

## 2016-06-21 DIAGNOSIS — D72829 Elevated white blood cell count, unspecified: Secondary | ICD-10-CM

## 2016-06-21 LAB — CBC WITH DIFFERENTIAL/PLATELET
BASO%: 0.8 % (ref 0.0–2.0)
BASOS ABS: 0.1 10*3/uL (ref 0.0–0.1)
EOS%: 0.1 % (ref 0.0–7.0)
Eosinophils Absolute: 0 10*3/uL (ref 0.0–0.5)
HEMATOCRIT: 34.6 % — AB (ref 34.8–46.6)
HEMOGLOBIN: 11 g/dL — AB (ref 11.6–15.9)
LYMPH#: 2.4 10*3/uL (ref 0.9–3.3)
LYMPH%: 15.5 % (ref 14.0–49.7)
MCH: 24.1 pg — AB (ref 25.1–34.0)
MCHC: 31.9 g/dL (ref 31.5–36.0)
MCV: 75.6 fL — AB (ref 79.5–101.0)
MONO#: 0.8 10*3/uL (ref 0.1–0.9)
MONO%: 5.3 % (ref 0.0–14.0)
NEUT#: 12.2 10*3/uL — ABNORMAL HIGH (ref 1.5–6.5)
NEUT%: 78.3 % — ABNORMAL HIGH (ref 38.4–76.8)
Platelets: 442 10*3/uL — ABNORMAL HIGH (ref 145–400)
RBC: 4.58 10*6/uL (ref 3.70–5.45)
RDW: 16.1 % — AB (ref 11.2–14.5)
WBC: 15.5 10*3/uL — ABNORMAL HIGH (ref 3.9–10.3)

## 2016-06-21 LAB — COMPREHENSIVE METABOLIC PANEL
ALBUMIN: 3.5 g/dL (ref 3.5–5.0)
ALK PHOS: 153 U/L — AB (ref 40–150)
ALT: 17 U/L (ref 0–55)
ANION GAP: 10 meq/L (ref 3–11)
AST: 11 U/L (ref 5–34)
BUN: 18.1 mg/dL (ref 7.0–26.0)
CALCIUM: 10.2 mg/dL (ref 8.4–10.4)
CHLORIDE: 98 meq/L (ref 98–109)
CO2: 33 mEq/L — ABNORMAL HIGH (ref 22–29)
Creatinine: 0.8 mg/dL (ref 0.6–1.1)
EGFR: 86 mL/min/{1.73_m2} — AB (ref >90)
Glucose: 126 mg/dl (ref 70–140)
POTASSIUM: 3.4 meq/L — AB (ref 3.5–5.1)
Sodium: 140 mEq/L (ref 136–145)
Total Bilirubin: 0.37 mg/dL (ref 0.20–1.20)
Total Protein: 7.3 g/dL (ref 6.4–8.3)

## 2016-06-21 NOTE — Progress Notes (Signed)
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  BRIEF ONCOLOGIC HISTORY:  Oncology History   Anaplastic Lymphoma, T-cell, 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)   Staging method: AJCC 7th Edition   Clinical: Stage II signed by Heath Lark, MD on 04/03/2013  3:31 PM   Summary: Stage II      History of lymphoma   02/07/2013 Imaging    [place] confirmed enlarged LN      03/09/2013 Procedure    LN biopsy confirmed T cell lymphoma      03/23/2013 Procedure    She has port placement      03/26/2013 Imaging    Echo showed normal ejection fraction      03/29/2013 Bone Marrow Biopsy    Bone marrow biopsy was negative      04/02/2013 Imaging    PET/CT scan showed localized, stage II disease      04/04/2013 - 07/20/2013 Chemotherapy    She has completed 6 cycles of CHOP plus etoposide chemotherapy.      06/01/2013 Imaging    PET CT scan show complete response to treatment      06/27/2013 Adverse Reaction    Dose of vincristine was reduced due to peripheral neuropathy.      08/31/2013 Imaging    Repeat PET scans show no evidence of disease.      06/10/2014 Imaging    CT chest showed no evidence of recurrence       INTERVAL HISTORY:  Ms. Sayler presents to the [facility] today for routine follow-up for her history of T cell lymphoma.  She is doing moderately well today.  This winter has been rough on her.  She had two different strains of the flu, and is recovering from bronchitis infection.  She is currently taking Levaquin along with a prednisone taper.  She has been taking as much as 58m of prednisone this past week due to a medication transcription error by her pharmacy.  She is feeling better now.  No lymphadenopathy, fevers, chills, night sweats, pain, weight loss, or further concerns.      REVIEW OF SYSTEMS:  Review of Systems  Constitutional: Negative for appetite change, chills,  diaphoresis, fatigue and unexpected weight change.  HENT:   Negative for hearing loss and lump/mass.   Eyes: Negative for eye problems and icterus.  Respiratory: Negative for chest tightness, cough and shortness of breath.   Cardiovascular: Negative for chest pain and leg swelling.  Gastrointestinal: Negative for abdominal distention and abdominal pain.  Endocrine: Negative for hot flashes.  Genitourinary: Negative for difficulty urinating and dyspareunia.   Musculoskeletal: Negative for arthralgias and gait problem.  Skin: Negative for itching and rash.  Neurological: Negative for dizziness, gait problem and light-headedness.  Hematological: Negative for adenopathy. Does not bruise/bleed easily.  Psychiatric/Behavioral: Negative for depression. The patient is not nervous/anxious.       PAST MEDICAL/SURGICAL HISTORY:  Past Medical History:  Diagnosis Date  . Adenopathy    left axillary  . Anemia   . Anxiety   . Anxiety state, unspecified   . Asthma    no problem with asthma in several years  . Barrett esophagus   . Depression   . Esophageal reflux   . Headache 05/16/2013  . Herpes simplex without mention of complication   . Hyperlipidemia 03/12/2014  . Hypertension   . Lymphoma, T-cell ([place] 03/20/2013  . Mental disorder   .  Neuropathic pain 04/04/2014  . Skin infection 11/02/2013  . Unspecified asthma(493.90)   . Unspecified essential hypertension   . URI (upper respiratory infection) 01/15/2014   Past Surgical History:  Procedure Laterality Date  . ABDOMINAL HYSTERECTOMY  2006  . AXILLARY LYMPH NODE DISSECTION Left 03/09/2013   Procedure: EXCISION LEFT AXILLARY LYMPH NODES;  Surgeon: [name], MD;  Location: [facility];  Service: General;  Laterality: Left;  . CERVICAL CONIZATION W/BX    . CHOLECYSTECTOMY     with hernia repair  . KNEE SURGERY  02/2010   Right--arthroscopic  . NISSEN FUNDOPLICATION    . PORTACATH PLACEMENT N/A 03/23/2013   Procedure: INSERTION  PORT-A-CATH;  Surgeon: [name], MD;  Location: [facility];  Service: General;  Laterality: N/A;  . TUBAL LIGATION       ALLERGIES:  Allergies  Allergen Reactions  . Betadine [Povidone Iodine] Hives and Itching  . Codeine     REACTION: SICK-NAUSEATED     CURRENT MEDICATIONS:  Outpatient Encounter Prescriptions as of 06/21/2016  Medication Sig  . albuterol (PROAIR HFA) 108 (90 Base) MCG/ACT inhaler 2 puffs q6h prn  . beclomethasone (QVAR) 40 MCG/ACT inhaler Inhale 2 puffs into the lungs 2 (two) times daily.  . Cholecalciferol (VITAMIN D) 2000 units tablet Take 2,000 Units by mouth daily.  Marland Kitchen FLUoxetine (PROZAC) 20 MG tablet Take 1 tablet (20 mg total) by mouth daily.  . fluticasone (FLONASE) 50 MCG/ACT nasal spray Place 2 sprays into both nostrils daily.  . hydrochlorothiazide (HYDRODIURIL) 25 MG tablet Take 1 tablet (25 mg total) by mouth daily.  Marland Kitchen omeprazole (PRILOSEC OTC) 20 MG tablet Take 1 tablet (20 mg total) by mouth daily as needed.  . predniSONE (DELTASONE) 10 MG tablet 2 po tid x 3 days then 1 po tid x 3 days then 1/2 tab po tid x 3 days then stop  . promethazine-dextromethorphan (PROMETHAZINE-DM) 6.25-15 MG/5ML syrup Take 5 mLs by mouth 4 (four) times daily as needed.  . valACYclovir (VALTREX) 1000 MG tablet Take 1 tablet (1,000 mg total) by mouth 3 (three) times daily. (Patient taking differently: Take 1,000 mg by mouth 3 (three) times daily as needed. )  . vitamin B-12 (CYANOCOBALAMIN) 1000 MCG tablet Take 1,000 mcg by mouth daily.  . [DISCONTINUED] levofloxacin (LEVAQUIN) 500 MG tablet Take 1 tablet (500 mg total) by mouth daily.   No facility-administered encounter medications on file as of 06/21/2016.      ONCOLOGIC FAMILY HISTORY:  Family History  Problem Relation Age of Onset  . Heart disease Mother   . Coronary artery disease      with hernia repair  . Migraines    . Hypertension    . Breast cancer Maternal Grandmother   . Cancer Maternal Grandmother 50     breast  . Colon cancer Neg Hx       SOCIAL HISTORY:  CRYSTALLYNN NOORANI is single and lives with her boyfriend Cornelia Copa in [place], [place].  Ms. Montero  Has no children, she did have a son who passed away four years ago.  Currently working full time at [employer] in the [employer].  Denies any current or history of tobacco, alcohol, or illicit drug use.   PHYSICAL EXAMINATION:  Vital Signs: Vitals:   06/21/16 1529  BP: (!) 145/82  Pulse: (!) 103  Resp: 18  Temp: 98.4 F (36.9 C)   Filed Weights   06/21/16 1529  Weight: 255 lb 1.6 oz (115.7 kg)  General: Well-nourished, well-appearingfemale in no acute distress.Unaccompanied today. HEENT: Head is normocephalic.  Pupils equal and reactive to light. Conjunctivae clear without exudate.  Sclerae anicteric. Oral mucosa is pink, moist.  Oropharynx is pink without lesions or erythema.  Lymph: No cervical, supraclavicular, infraclavicular, or axillary lymphadenopathy noted on palpation.  Cardiovascular: Regular rate and rhythm.Marland Kitchen Respiratory: Clear to auscultation bilaterally. Chest expansion symmetric; breathing non-labored.  GI: Abdomen soft and round; non-tender, non-distended. Bowel sounds normoactive. No hepatosplenomegaly.   GU: Deferred.  Neuro: No focal deficits. Steady gait.  Psych: Mood and affect normal and appropriate for situation.  Extremities: No edema. Skin: Warm and dry.   LABORATORY DATA:  Appointment on 06/21/2016  Component Date Value Ref Range Status  . WBC 06/21/2016 15.5* 3.9 - 10.3 10e3/uL Final  . NEUT# 06/21/2016 12.2* 1.5 - 6.5 10e3/uL Final  . HGB 06/21/2016 11.0* 11.6 - 15.9 g/dL Final  . HCT 06/21/2016 34.6* 34.8 - 46.6 % Final  . Platelets 06/21/2016 442* 145 - 400 10e3/uL Final  . MCV 06/21/2016 75.6* 79.5 - 101.0 fL Final  . MCH 06/21/2016 24.1* 25.1 - 34.0 pg Final  . MCHC 06/21/2016 31.9  31.5 - 36.0 g/dL Final  . RBC 06/21/2016 4.58  3.70 - 5.45 10e6/uL  Final  . RDW 06/21/2016 16.1* 11.2 - 14.5 % Final  . lymph# 06/21/2016 2.4  0.9 - 3.3 10e3/uL Final  . MONO# 06/21/2016 0.8  0.1 - 0.9 10e3/uL Final  . Eosinophils Absolute 06/21/2016 0.0  0.0 - 0.5 10e3/uL Final  . Basophils Absolute 06/21/2016 0.1  0.0 - 0.1 10e3/uL Final  . NEUT% 06/21/2016 78.3* 38.4 - 76.8 % Final  . LYMPH% 06/21/2016 15.5  14.0 - 49.7 % Final  . MONO% 06/21/2016 5.3  0.0 - 14.0 % Final  . EOS% 06/21/2016 0.1  0.0 - 7.0 % Final  . BASO% 06/21/2016 0.8  0.0 - 2.0 % Final  . Sodium 06/21/2016 140  136 - 145 mEq/L Final  . Potassium 06/21/2016 3.4* 3.5 - 5.1 mEq/L Final  . Chloride 06/21/2016 98  98 - 109 mEq/L Final  . CO2 06/21/2016 33* 22 - 29 mEq/L Final  . Glucose 06/21/2016 126  70 - 140 mg/dl Final  . BUN 06/21/2016 18.1  7.0 - 26.0 mg/dL Final  . Creatinine 06/21/2016 0.8  0.6 - 1.1 mg/dL Final  . Total Bilirubin 06/21/2016 0.37  0.20 - 1.20 mg/dL Final  . Alkaline Phosphatase 06/21/2016 153* 40 - 150 U/L Final  . AST 06/21/2016 11  5 - 34 U/L Final  . ALT 06/21/2016 17  0 - 55 U/L Final  . Total Protein 06/21/2016 7.3  6.4 - 8.3 g/dL Final  . Albumin 06/21/2016 3.5  3.5 - 5.0 g/dL Final  . Calcium 06/21/2016 10.2  8.4 - 10.4 mg/dL Final  . Anion Gap 06/21/2016 10  3 - 11 mEq/L Final  . EGFR 06/21/2016 86* >90 ml/min/1.73 m2 Final     DIAGNOSTIC IMAGING:  None for this visit     ASSESSMENT AND PLAN:  Ms.. Ofarrell is a pleasant 56 y.o. female with h/o t cell lymphoma , diagnosed in 02/2013 treated with 6 cycles of CHOP plus Etoposide, with NED on follow up scans. Ms.Mulvey presents to the [facility] for surveillance and routine follow-up.   1. History of Stage II T cell lymphoma:  Ms. Mincer is currently clinically without evidence of disease or recurrence of lymphoma. Her WBC is elevated today.  This is likely due to being on  very high dose steroids over the past week.  She is tapering off of those now, so she will return in  one month for repeat lab work and to discuss those results.  She will follow-up in the Survivorship Clinic in 1 year with labs, history, and physical exam per surveillance protocol.  I encouraged her to call me with any questions or concerns before her next visit at the cancer center, and I would be happy to see the patient sooner, if needed.    2. Cancer screening:  Due to Ms. Hessler's history and age, she should receive screening for skin cancers, breast cancer, colon cancer, and gynecologic cancers.  I also recommended she continue with an annual exam with her PCP with lab work  The patient was encouraged to follow-up with her PCP for appropriate cancer screenings.   3. Health maintenance and wellness promotion: Ms. Odenthal was encouraged to consume 5-7 servings of fruits and vegetables per day. The patient was also encouraged to engage in moderate to vigorous exercise for 30 minutes per day most days of the week. Ms. Vath was instructed to limit her alcohol consumption and continue to abstain from tobacco use.    Dispo:  -Return to cancer center to see Survivorship NP in one month with repeat labs   A total of (30) minutes of face-to-face time was spent with this patient with greater than 50% of that time in counseling and care-coordination.   Gardenia Phlegm, NP Survivorship Program [facility] [phone]   Note: PRIMARY CARE PROVIDER Ann Held, [place] [phone]

## 2016-07-05 ENCOUNTER — Ambulatory Visit (INDEPENDENT_AMBULATORY_CARE_PROVIDER_SITE_OTHER): Payer: 59 | Admitting: Orthopaedic Surgery

## 2016-07-05 ENCOUNTER — Encounter (INDEPENDENT_AMBULATORY_CARE_PROVIDER_SITE_OTHER): Payer: Self-pay | Admitting: Orthopaedic Surgery

## 2016-07-05 VITALS — BP 127/81 | HR 94 | Ht 68.0 in | Wt 255.0 lb

## 2016-07-05 DIAGNOSIS — S52532D Colles' fracture of left radius, subsequent encounter for closed fracture with routine healing: Secondary | ICD-10-CM

## 2016-07-05 NOTE — Progress Notes (Signed)
Office Visit Note   Patient: Anna Jackson           Date of Birth: 1960/10/24           MRN: [id number] Visit Date: 07/05/2016              Requested by: [address] [place], [place] [address] [place], [place] [address] PCP: Carollee Herter, Alferd Apa, DO   Assessment & Plan: Visit Diagnoses:  1. Closed Colles' fracture of left radius with routine healing, subsequent encounter    Status post open reduction internal fixation in [place]-doing well Plan: Return as needed. Urged to continue home exercises. No restrictions  Follow-Up Instructions: Return if symptoms worsen or fail to improve.   Orders:  No orders of the defined types were placed in this encounter.  No orders of the defined types were placed in this encounter.     Procedures: No procedures performed   Clinical Data: No additional findings.   Subjective: Chief Complaint  Patient presents with  . Left Wrist - Follow-up    Anna Jackson is a 56 y o that is here for a F/U of left wrist. She relates she went to one PT appt and has been doing the exercises daily at home. No pain, weakness and slowly getting the strength back.  10 weeks status post open reduction internal fixation of left nondominant distal radius fracture in [place]. [name] is happy with her results. She has a home exercise program and no longer wears the splint  HPI  Review of Systems   Objective: Vital Signs: BP 127/81   Pulse 94   Ht 5\' 8"  (1.727 m)   Wt 255 lb (115.7 kg)   BMI 38.77 kg/m   Physical Exam  Ortho Exam no swelling of left hand or fingers. Wearing rings without a problem. Full pronation and supination. Proximally 40 of dorsiflexion and 45 for flexion left wrist. No erythema or ecchymosis. No tenderness over the incision which is healed nicely. Neurovascular exam intact.  Specialty Comments:  No specialty comments available.  Imaging: No results found.   PMFS History: Patient Active Problem  List   Diagnosis Date Noted  . Acute bronchitis 06/14/2016  . Vitamin B12 deficiency 10/10/2014  . Neuropathic pain 04/04/2014  . Hyperlipidemia 03/12/2014  . Obesity (BMI 30-39.9) 01/08/2014  . 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 Headache 05/16/2013  . History of lymphoma 03/20/2013  . Morbid obesity ([place]) 08/08/2012  . Barrett's esophagus 08/10/2010  . Hemorrhoid 06/30/2010  . Heme positive stool 06/30/2010  . Depression 03/04/2009  . GERD 12/08/2007  . Anemia in neoplastic disease 12/08/2007  . Personal history of other diseases of digestive system 12/05/2007  . POSTMENOPAUSAL STATUS 11/21/2007  . ANXIETY 05/09/2007  . Pain in joint, lower leg 10/20/2006  . HERPES SIMPLEX, UNCOMPLICATED 44/04/4740  . ONYCHOMYCOSIS, TOENAILS 05/27/2006  . Essential hypertension 04/26/2006  . ASTHMA 04/26/2006  . HEADACHE 04/26/2006  . UMBILICAL HERNIORRHAPHY, HX OF 04/26/2006   Past Medical History:  Diagnosis Date  . Adenopathy    left axillary  . Anemia   . Anxiety   . Anxiety state, unspecified   . Asthma    no problem with asthma in several years  . Barrett esophagus   . Depression   . Esophageal reflux   . Headache 05/16/2013  . Herpes simplex without mention of complication   . Hyperlipidemia 03/12/2014  . Hypertension   . Lymphoma, T-cell (  [place]) 03/20/2013  . Mental disorder   . Neuropathic pain 04/04/2014  . Skin infection 11/02/2013  . Unspecified asthma(493.90)   . Unspecified essential hypertension   . URI (upper respiratory infection) 01/15/2014    Family History  Problem Relation Age of Onset  . Heart disease Mother   . Coronary artery disease Unknown        with hernia repair  . Migraines Unknown   . Hypertension Unknown   . Breast cancer Maternal Grandmother   . Cancer Maternal Grandmother 50       breast  . Colon cancer Neg Hx     Past Surgical History:  Procedure Laterality Date  . ABDOMINAL HYSTERECTOMY  2006  . AXILLARY LYMPH NODE  DISSECTION Left 03/09/2013   Procedure: EXCISION LEFT AXILLARY LYMPH NODES;  Surgeon: [name], MD;  Location: [facility];  Service: General;  Laterality: Left;  . CERVICAL CONIZATION W/BX    . CHOLECYSTECTOMY     with hernia repair  . KNEE SURGERY  02/2010   Right--arthroscopic  . NISSEN FUNDOPLICATION    . PORTACATH PLACEMENT N/A 03/23/2013   Procedure: INSERTION PORT-A-CATH;  Surgeon: [name], MD;  Location: [facility];  Service: General;  Laterality: N/A;  . TUBAL LIGATION     Social History   Occupational History  . [employer]   Social History Main Topics  . Smoking status: Never Smoker  . Smokeless tobacco: Never Used  . Alcohol use Yes     Comment: occasional  . Drug use: No  . Sexual activity: Not Currently    Partners: Male     Garald Balding, MD   Note - This record has been created using [employer].  Chart creation errors have been sought, but may not always  have been located. Such creation errors do not reflect on  the standard of medical care.

## 2016-07-13 ENCOUNTER — Other Ambulatory Visit (INDEPENDENT_AMBULATORY_CARE_PROVIDER_SITE_OTHER): Payer: Managed Care, Other (non HMO)

## 2016-07-13 ENCOUNTER — Ambulatory Visit (INDEPENDENT_AMBULATORY_CARE_PROVIDER_SITE_OTHER): Payer: Managed Care, Other (non HMO) | Admitting: Family Medicine

## 2016-07-13 ENCOUNTER — Encounter: Payer: Self-pay | Admitting: Family Medicine

## 2016-07-13 VITALS — BP 120/78 | HR 56 | Temp 98.1°F | Resp 16 | Ht 68.0 in | Wt 259.6 lb

## 2016-07-13 DIAGNOSIS — D72829 Elevated white blood cell count, unspecified: Secondary | ICD-10-CM | POA: Diagnosis not present

## 2016-07-13 DIAGNOSIS — R2232 Localized swelling, mass and lump, left upper limb: Secondary | ICD-10-CM | POA: Diagnosis not present

## 2016-07-13 LAB — CBC WITH DIFFERENTIAL/PLATELET
BASOS ABS: 0.1 10*3/uL (ref 0.0–0.1)
Basophils Relative: 1.5 % (ref 0.0–3.0)
EOS ABS: 0.1 10*3/uL (ref 0.0–0.7)
Eosinophils Relative: 1.2 % (ref 0.0–5.0)
HCT: 33.3 % — ABNORMAL LOW (ref 36.0–46.0)
Hemoglobin: 10.7 g/dL — ABNORMAL LOW (ref 12.0–15.0)
LYMPHS ABS: 1.7 10*3/uL (ref 0.7–4.0)
Lymphocytes Relative: 24.1 % (ref 12.0–46.0)
MCHC: 32.1 g/dL (ref 30.0–36.0)
MCV: 76.3 fl — ABNORMAL LOW (ref 78.0–100.0)
Monocytes Absolute: 0.4 10*3/uL (ref 0.1–1.0)
Monocytes Relative: 6.3 % (ref 3.0–12.0)
NEUTROS PCT: 66.9 % (ref 43.0–77.0)
Neutro Abs: 4.7 10*3/uL (ref 1.4–7.7)
Platelets: 520 10*3/uL — ABNORMAL HIGH (ref 150.0–400.0)
RBC: 4.37 Mil/uL (ref 3.87–5.11)
RDW: 16.8 % — ABNORMAL HIGH (ref 11.5–15.5)
WBC: 7 10*3/uL (ref 4.0–10.5)

## 2016-07-13 NOTE — Progress Notes (Signed)
Patient ID: Anna Jackson, female   DOB: 1960/03/31, 56 y.o.   MRN: [id number]     Subjective:  I acted as a Education administrator for Dr. Carollee Herter.  [name], [place]   Patient ID: Anna Jackson, female    DOB: [birth year], 56 y.o.   MRN: [id number]  Chief Complaint  Patient presents with  . knot under left arm  . Fatigue    HPI  Patient is in today for knot under left arm and feeling tired.  Knot has been under left arm for about a week.  Has had a little pain, no itching, no swelling.  No known injury.  Has had fatigue since she been sick.  She sleeping a lot.    Patient Care Team: Carollee Herter, Alferd Apa, DO as PCP - General Fanny Skates, MD as Consulting Physician (General Surgery) Heath Lark, MD as Consulting Physician (Hematology and Oncology)   Past Medical History:  Diagnosis Date  . Adenopathy    left axillary  . Anemia   . Anxiety   . Anxiety state, unspecified   . Asthma    no problem with asthma in several years  . Barrett esophagus   . Depression   . Esophageal reflux   . Headache 05/16/2013  . Herpes simplex without mention of complication   . Hyperlipidemia 03/12/2014  . Hypertension   . 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 Mental disorder   . Neuropathic pain 04/04/2014  . Skin infection 11/02/2013  . Unspecified asthma(493.90)   . Unspecified essential hypertension   . URI (upper respiratory infection) 01/15/2014    Past Surgical History:  Procedure Laterality Date  . ABDOMINAL HYSTERECTOMY  2006  . AXILLARY LYMPH NODE DISSECTION Left 03/09/2013   Procedure: EXCISION LEFT AXILLARY LYMPH NODES;  Surgeon: [name], MD;  Location: [facility];  Service: General;  Laterality: Left;  . CERVICAL CONIZATION W/BX    . CHOLECYSTECTOMY     with hernia repair  . KNEE SURGERY  02/2010   Right--arthroscopic  . NISSEN FUNDOPLICATION    . PORTACATH PLACEMENT N/A 03/23/2013   Procedure: INSERTION PORT-A-CATH;  Surgeon: [name], MD;  Location: [facility];  Service:  General;  Laterality: N/A;  . TUBAL LIGATION      Family History  Problem Relation Age of Onset  . Heart disease Mother   . Coronary artery disease Unknown        with hernia repair  . Migraines Unknown   . Hypertension Unknown   . Breast cancer Maternal Grandmother   . Cancer Maternal Grandmother 50       breast  . Colon cancer Neg Hx     Social History   Social History  . Marital status: Single    Spouse name: N/A  . Number of children: 1  . Years of education: N/A   Occupational History  . [place]   Social History Main Topics  . Smoking status: Never Smoker  . Smokeless tobacco: Never Used  . Alcohol use Yes     Comment: occasional  . Drug use: No  . Sexual activity: Not Currently    Partners: Male   Other Topics Concern  . Not on file   Social History Narrative   Exercise--  treadmill    Outpatient Medications Prior to Visit  Medication Sig Dispense Refill  . albuterol (PROAIR HFA) 108 (90 Base) MCG/ACT inhaler 2 puffs q6h prn 1 Inhaler 2  . beclomethasone (QVAR) 40 MCG/ACT inhaler  Inhale 2 puffs into the lungs 2 (two) times daily. 1 Inhaler 12  . Cholecalciferol (VITAMIN D) 2000 units tablet Take 2,000 Units by mouth daily.    Marland Kitchen FLUoxetine (PROZAC) 20 MG tablet Take 1 tablet (20 mg total) by mouth daily. 90 tablet 2  . fluticasone (FLONASE) 50 MCG/ACT nasal spray Place 2 sprays into both nostrils daily. 16 g 6  . hydrochlorothiazide (HYDRODIURIL) 25 MG tablet Take 1 tablet (25 mg total) by mouth daily. 90 tablet 2  . omeprazole (PRILOSEC OTC) 20 MG tablet Take 1 tablet (20 mg total) by mouth daily as needed. 30 tablet 10  . valACYclovir (VALTREX) 1000 MG tablet Take 1 tablet (1,000 mg total) by mouth 3 (three) times daily. (Patient taking differently: Take 1,000 mg by mouth 3 (three) times daily as needed. ) 90 tablet 1  . vitamin B-12 (CYANOCOBALAMIN) 1000 MCG tablet Take 1,000 mcg by mouth daily.    . predniSONE (DELTASONE) 10 MG  tablet 2 po tid x 3 days then 1 po tid x 3 days then 1/2 tab po tid x 3 days then stop 25 tablet 0  . promethazine-dextromethorphan (PROMETHAZINE-DM) 6.25-15 MG/5ML syrup Take 5 mLs by mouth 4 (four) times daily as needed. 118 mL 0   No facility-administered medications prior to visit.     Allergies  Allergen Reactions  . Betadine [Povidone Iodine] Hives and Itching  . Codeine     REACTION: SICK-NAUSEATED    Review of Systems  Constitutional: Positive for malaise/fatigue. Negative for fever.  HENT: Negative for congestion.   Eyes: Negative for blurred vision.  Respiratory: Negative for cough and shortness of breath.   Cardiovascular: Negative for chest pain, palpitations and leg swelling.  Gastrointestinal: Negative for vomiting.  Musculoskeletal: Negative for back pain.  Skin: Negative for rash.       Knot under left arm   Neurological: Negative for loss of consciousness and headaches.       Objective:    Physical Exam  Constitutional: She is oriented to person, place, and time. She appears well-developed and well-nourished. No distress.  HENT:  Head: Normocephalic and atraumatic.  Eyes: Conjunctivae are normal.  Neck: Normal range of motion. No thyromegaly present.  Cardiovascular: Normal rate and regular rhythm.   Pulmonary/Chest: Effort normal and breath sounds normal. She has no wheezes. Right breast exhibits no inverted nipple, no mass, no nipple discharge, no skin change and no tenderness. Left breast exhibits no inverted nipple, no mass, no nipple discharge, no skin change and no tenderness.    Abdominal: Soft. Bowel sounds are normal. There is no tenderness.  Musculoskeletal: Normal range of motion. She exhibits no edema or deformity.  Neurological: She is alert and oriented to person, place, and time.  Skin: Skin is warm and dry. She is not diaphoretic.  Psychiatric: She has a normal mood and affect.    BP 120/78 (BP Location: Right Arm, Cuff Size: Large)    Pulse (!) 56   Temp 98.1 F (36.7 C) (Oral)   Resp 16   Ht '5\' 8"'  (1.727 m)   Wt 259 lb 9.6 oz (117.8 kg)   SpO2 97%   BMI 39.47 kg/m  Wt Readings from Last 3 Encounters:  07/13/16 259 lb 9.6 oz (117.8 kg)  07/05/16 255 lb (115.7 kg)  06/21/16 255 lb 1.6 oz (115.7 kg)   BP Readings from Last 3 Encounters:  07/13/16 120/78  07/05/16 127/81  06/21/16 (!) 145/82     Immunization History  Administered Date(s) Administered  . Influenza Split 11/23/2010  . Influenza Whole 11/21/2007, 12/22/2009, 11/16/2011, 11/09/2012  . Influenza, Seasonal, Injecte, Preservative Fre 12/11/2014  . Influenza,inj,Quad PF,36+ Mos 12/10/2013  . Influenza-Unspecified 12/10/2015  . Pneumococcal Conjugate-13 12/10/2013  . Pneumococcal Polysaccharide-23 02/07/2014  . Td 05/09/2001  . Tdap 11/23/2010    Health Maintenance  Topic Date Due  . Hepatitis C Screening  19-Feb-1960  . HIV Screening  11/14/1977  . PAP SMEAR  03/28/2015  . INFLUENZA VACCINE  09/15/2016  . MAMMOGRAM  01/18/2017  . COLONOSCOPY  10/01/2020  . TETANUS/TDAP  11/22/2020    Lab Results  Component Value Date   WBC 7.0 07/13/2016   HGB 10.7 (L) 07/13/2016   HCT 33.3 (L) 07/13/2016   PLT 520.0 (H) 07/13/2016   GLUCOSE 126 06/21/2016   CHOL 178 02/17/2016   TRIG 143.0 02/17/2016   HDL 63.80 02/17/2016   LDLDIRECT 105.0 11/23/2010   LDLCALC 85 02/17/2016   ALT 17 06/21/2016   AST 11 06/21/2016   NA 140 06/21/2016   K 3.4 (L) 06/21/2016   CL 102 02/17/2016   CREATININE 0.8 06/21/2016   BUN 18.1 06/21/2016   CO2 33 (H) 06/21/2016   TSH 1.05 01/13/2016   INR 0.99 03/29/2013    Lab Results  Component Value Date   TSH 1.05 01/13/2016   Lab Results  Component Value Date   WBC 7.0 07/13/2016   HGB 10.7 (L) 07/13/2016   HCT 33.3 (L) 07/13/2016   MCV 76.3 (L) 07/13/2016   PLT 520.0 (H) 07/13/2016   Lab Results  Component Value Date   NA 140 06/21/2016   K 3.4 (L) 06/21/2016   CHLORIDE 98 06/21/2016   CO2 33 (H)  06/21/2016   GLUCOSE 126 06/21/2016   BUN 18.1 06/21/2016   CREATININE 0.8 06/21/2016   BILITOT 0.37 06/21/2016   ALKPHOS 153 (H) 06/21/2016   AST 11 06/21/2016   ALT 17 06/21/2016   PROT 7.3 06/21/2016   ALBUMIN 3.5 06/21/2016   CALCIUM 10.2 06/21/2016   ANIONGAP 10 06/21/2016   EGFR 86 (L) 06/21/2016   GFR 85.83 02/17/2016   Lab Results  Component Value Date   CHOL 178 02/17/2016   Lab Results  Component Value Date   HDL 63.80 02/17/2016   Lab Results  Component Value Date   LDLCALC 85 02/17/2016   Lab Results  Component Value Date   TRIG 143.0 02/17/2016   Lab Results  Component Value Date   CHOLHDL 3 02/17/2016   No results found for: HGBA1C       Assessment & Plan:   Problem List Items Addressed This Visit    None    Visit Diagnoses    Axillary mass, left    -  Primary   Relevant Orders   US BREAST COMPLETE UNI LEFT INC AXILLA   MM Digital Diagnostic Bilat      I have discontinued Ms. Coletta's promethazine-dextromethorphan and predniSONE. I am also having her maintain her omeprazole, vitamin B-12, valACYclovir, Vitamin D, hydrochlorothiazide, FLUoxetine, albuterol, beclomethasone, and fluticasone.  No orders of the defined types were placed in this encounter.   CMA served as Education administrator during this visit. History, Physical and Plan performed by medical provider. Documentation and orders reviewed and attested to.  Ann Held, DO

## 2016-07-14 ENCOUNTER — Ambulatory Visit
Admission: RE | Admit: 2016-07-14 | Discharge: 2016-07-14 | Disposition: A | Discharge status: Home / Self Care | Payer: Managed Care, Other (non HMO) | Source: Ambulatory Visit | Attending: Family Medicine | Admitting: Family Medicine

## 2016-07-14 ENCOUNTER — Telehealth: Payer: Self-pay | Admitting: *Deleted

## 2016-07-14 ENCOUNTER — Other Ambulatory Visit: Payer: Self-pay | Admitting: Hematology and Oncology

## 2016-07-14 DIAGNOSIS — R2232 Localized swelling, mass and lump, left upper limb: Secondary | ICD-10-CM

## 2016-07-14 DIAGNOSIS — Z8579 Personal history of other malignant neoplasms of lymphoid, hematopoietic and related tissues: Secondary | ICD-10-CM

## 2016-07-14 DIAGNOSIS — Z8572 Personal history of non-Hodgkin lymphomas: Secondary | ICD-10-CM

## 2016-07-14 DIAGNOSIS — D509 Iron deficiency anemia, unspecified: Secondary | ICD-10-CM

## 2016-07-14 NOTE — Telephone Encounter (Signed)
Notified to have [facility] send results to Dr Alvy Bimler

## 2016-07-14 NOTE — Telephone Encounter (Signed)
Pt states her platelets were high (520) at her PCP and she has a knot under her left arm. PCP ordered a diagnostic mammogram to be done today.  Is being followed in [facility] Survivorship program

## 2016-07-14 NOTE — Telephone Encounter (Signed)
Anna Jackson, please tell her to let mammogram center to send me a copy of the report I will make decision after the mammo is done

## 2016-07-15 ENCOUNTER — Ambulatory Visit (HOSPITAL_BASED_OUTPATIENT_CLINIC_OR_DEPARTMENT_OTHER): Payer: Managed Care, Other (non HMO)

## 2016-07-15 ENCOUNTER — Telehealth: Payer: Self-pay | Admitting: *Deleted

## 2016-07-15 ENCOUNTER — Telehealth: Payer: Self-pay | Admitting: Nurse Practitioner

## 2016-07-15 DIAGNOSIS — D509 Iron deficiency anemia, unspecified: Secondary | ICD-10-CM

## 2016-07-15 DIAGNOSIS — Z8579 Personal history of other malignant neoplasms of lymphoid, hematopoietic and related tissues: Secondary | ICD-10-CM

## 2016-07-15 LAB — CBC WITH DIFFERENTIAL/PLATELET
BASO%: 0.4 % (ref 0.0–2.0)
Basophils Absolute: 0 10*3/uL (ref 0.0–0.1)
EOS ABS: 0.1 10*3/uL (ref 0.0–0.5)
EOS%: 1.1 % (ref 0.0–7.0)
HCT: 33.6 % — ABNORMAL LOW (ref 34.8–46.6)
HEMOGLOBIN: 10.2 g/dL — AB (ref 11.6–15.9)
LYMPH%: 26.6 % (ref 14.0–49.7)
MCH: 23.9 pg — ABNORMAL LOW (ref 25.1–34.0)
MCHC: 30.4 g/dL — ABNORMAL LOW (ref 31.5–36.0)
MCV: 78.9 fL — AB (ref 79.5–101.0)
MONO#: 0.7 10*3/uL (ref 0.1–0.9)
MONO%: 8.6 % (ref 0.0–14.0)
NEUT#: 5.4 10*3/uL (ref 1.5–6.5)
NEUT%: 63.3 % (ref 38.4–76.8)
PLATELETS: 421 10*3/uL — AB (ref 145–400)
RBC: 4.26 10*6/uL (ref 3.70–5.45)
RDW: 15.5 % — AB (ref 11.2–14.5)
WBC: 8.6 10*3/uL (ref 3.9–10.3)
lymph#: 2.3 10*3/uL (ref 0.9–3.3)

## 2016-07-15 LAB — COMPREHENSIVE METABOLIC PANEL
ALBUMIN: 3.4 g/dL — AB (ref 3.5–5.0)
ALK PHOS: 164 U/L — AB (ref 40–150)
ALT: 18 U/L (ref 0–55)
ANION GAP: 10 meq/L (ref 3–11)
AST: 19 U/L (ref 5–34)
BILIRUBIN TOTAL: 0.33 mg/dL (ref 0.20–1.20)
BUN: 9.5 mg/dL (ref 7.0–26.0)
CO2: 30 meq/L — AB (ref 22–29)
CREATININE: 0.8 mg/dL (ref 0.6–1.1)
Calcium: 9.8 mg/dL (ref 8.4–10.4)
Chloride: 102 mEq/L (ref 98–109)
EGFR: 80 mL/min/{1.73_m2} — AB (ref >90)
GLUCOSE: 96 mg/dL (ref 70–140)
Potassium: 3 mEq/L — CL (ref 3.5–5.1)
SODIUM: 141 meq/L (ref 136–145)
TOTAL PROTEIN: 6.9 g/dL (ref 6.4–8.3)

## 2016-07-15 NOTE — Telephone Encounter (Signed)
Attempted to contact the patient regarding potassium level of 3.0. There was no answer and unable to leave a message. We will forward the labs to Dr. Signa Kell nurse.

## 2016-07-15 NOTE — Telephone Encounter (Signed)
Pt will come today at 4:00 for labs. Will see Dr Alvy Bimler tomorrow at 3:00pm. Msg to scheduler

## 2016-07-15 NOTE — Telephone Encounter (Signed)
LM to call. Come in today for labs and see Dr Alvy Bimler tomorrow.

## 2016-07-15 NOTE — Telephone Encounter (Signed)
-----   Message from Heath Lark, MD sent at 07/15/2016  7:00 AM EDT ----- Regarding: return visit I saw the results of mammogram and [place], negative Can she come in today for labs and see me tomorrow at 3 pm to review tests? If so, please place scheduling msg. I have ordered labs

## 2016-07-16 ENCOUNTER — Other Ambulatory Visit: Payer: Self-pay | Admitting: Family Medicine

## 2016-07-16 ENCOUNTER — Ambulatory Visit (HOSPITAL_BASED_OUTPATIENT_CLINIC_OR_DEPARTMENT_OTHER): Payer: Managed Care, Other (non HMO) | Admitting: Hematology and Oncology

## 2016-07-16 DIAGNOSIS — Z8579 Personal history of other malignant neoplasms of lymphoid, hematopoietic and related tissues: Secondary | ICD-10-CM | POA: Diagnosis not present

## 2016-07-16 DIAGNOSIS — D509 Iron deficiency anemia, unspecified: Secondary | ICD-10-CM

## 2016-07-16 DIAGNOSIS — G620 Drug-induced polyneuropathy: Secondary | ICD-10-CM

## 2016-07-16 DIAGNOSIS — T451X5A Adverse effect of antineoplastic and immunosuppressive drugs, initial encounter: Secondary | ICD-10-CM

## 2016-07-16 DIAGNOSIS — E876 Hypokalemia: Secondary | ICD-10-CM

## 2016-07-16 LAB — SEDIMENTATION RATE: SED RATE: 31 mm/h (ref 0–40)

## 2016-07-16 LAB — IRON AND TIBC
%SAT: 4 % — AB (ref 21–57)
Iron: 16 ug/dL — ABNORMAL LOW (ref 41–142)
TIBC: 409 ug/dL (ref 236–444)
UIBC: 393 ug/dL — AB (ref 120–384)

## 2016-07-16 LAB — FERRITIN: Ferritin: 8 ng/ml — ABNORMAL LOW (ref 9–269)

## 2016-07-16 MED ORDER — POTASSIUM CHLORIDE CRYS ER 20 MEQ PO TBCR: 20.0000 meq | EXTENDED_RELEASE_TABLET | Freq: Every day | ORAL | 2 refills | Status: DC

## 2016-07-17 ENCOUNTER — Encounter: Payer: Self-pay | Admitting: Hematology and Oncology

## 2016-07-17 NOTE — Assessment & Plan Note (Signed)
This is stable.  °The patient also has history of vitamin B 12 deficiency and is taking chronic vitamin B 12 replacement therapy. °

## 2016-07-17 NOTE — Assessment & Plan Note (Signed)
Examination is unremarkable for disease recurrence I do not feel strongly we need to pursue repeat CT imaging The palpable lump at the left axilla is consistent with prior surgical scar

## 2016-07-17 NOTE — Progress Notes (Signed)
[place] OFFICE PROGRESS NOTE  Patient Care Team: Carollee Herter, Alferd Apa, DO as PCP - General Fanny Skates, MD as Consulting Physician (General Surgery) Heath Lark, MD as Consulting Physician (Hematology and Oncology)  SUMMARY OF ONCOLOGIC HISTORY: Oncology History   Anaplastic Lymphoma, T-cell, 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)   Staging method: AJCC 7th Edition   Clinical: Stage II signed by Heath Lark, MD on 04/03/2013  3:31 PM   Summary: Stage II      History of lymphoma   02/07/2013 Imaging    [place] confirmed enlarged LN      03/09/2013 Procedure    LN biopsy confirmed T cell lymphoma      03/23/2013 Procedure    She has port placement      03/26/2013 Imaging    Echo showed normal ejection fraction      03/29/2013 Bone Marrow Biopsy    Bone marrow biopsy was negative      04/02/2013 Imaging    PET/CT scan showed localized, stage II disease      04/04/2013 - 07/20/2013 Chemotherapy    She has completed 6 cycles of CHOP plus etoposide chemotherapy.      06/01/2013 Imaging    PET CT scan show complete response to treatment      06/27/2013 Adverse Reaction    Dose of vincristine was reduced due to peripheral neuropathy.      08/31/2013 Imaging    Repeat PET scans show no evidence of disease.      06/10/2014 Imaging    CT chest showed no evidence of recurrence       INTERVAL HISTORY: Please see below for problem oriented charting. She is seen today with a new boyfriend, Cornelia Copa Since the last time I saw her, she was seen by cancer survivorship clinic and brought in concern about abnormal lump in the left axilla, at the site of prior biopsy Mammogram and ultrasound showed no abnormalities In the meantime, she has excessive chewing of ice and pica She denies leg cramps, dizziness, chest pain or shortness of breath She complained of mild fatigue She had history of recurrent bronchitis recently with cough,  resolved with antibiotics treatment Her prior neuropathy remained about the same  REVIEW OF SYSTEMS:   Constitutional: Denies fevers, chills or abnormal weight loss Eyes: Denies blurriness of vision Ears, nose, mouth, throat, and face: Denies mucositis or sore throat Respiratory: Denies cough, dyspnea or wheezes Cardiovascular: Denies palpitation, chest discomfort or lower extremity swelling Gastrointestinal:  Denies nausea, heartburn or change in bowel habits Skin: Denies abnormal skin rashes Lymphatics: Denies new lymphadenopathy or easy bruising Neurological:Denies numbness, tingling or new weaknesses Behavioral/Psych: Mood is stable, no new changes  All other systems were reviewed with the patient and are negative.  I have reviewed the past medical history, past surgical history, social history and family history with the patient and they are unchanged from previous note.  ALLERGIES:  is allergic to betadine [povidone iodine] and codeine.  MEDICATIONS:  Current Outpatient Prescriptions  Medication Sig Dispense Refill  . albuterol (PROAIR HFA) 108 (90 Base) MCG/ACT inhaler 2 puffs q6h prn 1 Inhaler 2  . beclomethasone (QVAR) 40 MCG/ACT inhaler Inhale 2 puffs into the lungs 2 (two) times daily. 1 Inhaler 12  . Cholecalciferol (VITAMIN D) 2000 units tablet Take 2,000 Units by mouth daily.    Marland Kitchen FLUoxetine (PROZAC) 20 MG tablet Take 1 tablet (20 mg total) by mouth daily. 90 tablet  2  . hydrochlorothiazide (HYDRODIURIL) 25 MG tablet Take 1 tablet (25 mg total) by mouth daily. 90 tablet 2  . omeprazole (PRILOSEC OTC) 20 MG tablet Take 1 tablet (20 mg total) by mouth daily as needed. 30 tablet 10  . valACYclovir (VALTREX) 1000 MG tablet Take 1 tablet (1,000 mg total) by mouth 3 (three) times daily. (Patient taking differently: Take 1,000 mg by mouth 3 (three) times daily as needed. ) 90 tablet 1  . vitamin B-12 (CYANOCOBALAMIN) 1000 MCG tablet Take 1,000 mcg by mouth daily.    . potassium  chloride SA (K-DUR,KLOR-CON) 20 MEQ tablet Take 1 tablet (20 mEq total) by mouth daily. (Patient not taking: Reported on 07/16/2016) 30 tablet 2   No current facility-administered medications for this visit.     PHYSICAL EXAMINATION: ECOG PERFORMANCE STATUS: 1 - Symptomatic but completely ambulatory  Vitals:   07/16/16 1459  BP: 138/84  Pulse: (!) 104  Resp: (!) 22  Temp: 98 F (36.7 C)   Filed Weights   07/16/16 1459  Weight: 259 lb 6 oz (117.7 kg)    GENERAL:alert, no distress and comfortable.  She is morbidly obese SKIN: skin color, texture, turgor are normal, no rashes or significant lesions EYES: normal, Conjunctiva are pink and non-injected, sclera clear OROPHARYNX:no exudate, no erythema and lips, buccal mucosa, and tongue normal  NECK: supple, thyroid normal size, non-tender, without nodularity LYMPH:  no palpable lymphadenopathy in the cervical, axillary or inguinal.  No abnormalities except for prior surgical scar LUNGS: clear to auscultation and percussion with normal breathing effort HEART: regular rate & rhythm and no murmurs and no lower extremity edema ABDOMEN:abdomen soft, non-tender and normal bowel sounds Musculoskeletal:no cyanosis of digits and no clubbing  NEURO: alert & oriented x 3 with fluent speech, no focal motor/sensory deficits  LABORATORY DATA:  I have reviewed the data as listed    Component Value Date/Time   NA 141 07/15/2016 1600   K 3.0 (LL) 07/15/2016 1600   CL 102 02/17/2016 0857   CO2 30 (H) 07/15/2016 1600   GLUCOSE 96 07/15/2016 1600   BUN 9.5 07/15/2016 1600   CREATININE 0.8 07/15/2016 1600   CALCIUM 9.8 07/15/2016 1600   PROT 6.9 07/15/2016 1600   ALBUMIN 3.4 (L) 07/15/2016 1600   AST 19 07/15/2016 1600   ALT 18 07/15/2016 1600   ALKPHOS 164 (H) 07/15/2016 1600   BILITOT 0.33 07/15/2016 1600   GFRNONAA 77 (L) 03/23/2013 0953   GFRAA 90 (L) 03/23/2013 0953    No results found for: SPEP, UPEP  Lab Results  Component Value  Date   WBC 8.6 07/15/2016   NEUTROABS 5.4 07/15/2016   HGB 10.2 (L) 07/15/2016   HCT 33.6 (L) 07/15/2016   MCV 78.9 (L) 07/15/2016   PLT 421 (H) 07/15/2016      Chemistry      Component Value Date/Time   NA 141 07/15/2016 1600   K 3.0 (LL) 07/15/2016 1600   CL 102 02/17/2016 0857   CO2 30 (H) 07/15/2016 1600   BUN 9.5 07/15/2016 1600   CREATININE 0.8 07/15/2016 1600      Component Value Date/Time   CALCIUM 9.8 07/15/2016 1600   ALKPHOS 164 (H) 07/15/2016 1600   AST 19 07/15/2016 1600   ALT 18 07/15/2016 1600   BILITOT 0.33 07/15/2016 1600       RADIOGRAPHIC STUDIES: I have personally reviewed the radiological images as listed and agreed with the findings in the report. Mm Diag Breast Tomo  Uni Left  Result Date: 07/14/2016 CLINICAL DATA:  Patient complains of a palpable abnormality in the left axilla. History of non-Hodgkin's lymphoma. EXAM: 2D DIGITAL DIAGNOSTIC LEFT MAMMOGRAM WITH CAD AND ADJUNCT TOMO ULTRASOUND LEFT BREAST COMPARISON:  Previous exam(s). ACR Breast Density Category b: There are scattered areas of fibroglandular density. FINDINGS: Postoperative changes are seen in the left axilla. No suspicious mass or malignant type microcalcifications identified in the left breast. Mammographic images were processed with CAD. On physical exam, I do not palpate a mass in the left axilla. Targeted ultrasound is performed, showing normal tissue in the left axilla. No enlarged adenopathy, mass or abnormal shadowing detected. IMPRESSION: No evidence of malignancy in the left breast. RECOMMENDATION: Bilateral screening mammogram in December of 2018 is recommended. I have discussed the findings and recommendations with the patient. Results were also provided in writing at the conclusion of the visit. If applicable, a reminder letter will be sent to the patient regarding the next appointment. BI-RADS CATEGORY  2: Benign. Electronically Signed   By: Lillia Mountain M.D.   On: 07/14/2016 14:32    [place] Axilla Left  Result Date: 07/14/2016 CLINICAL DATA:  Patient complains of a palpable abnormality in the left axilla. History of non-Hodgkin's lymphoma. EXAM: 2D DIGITAL DIAGNOSTIC LEFT MAMMOGRAM WITH CAD AND ADJUNCT TOMO ULTRASOUND LEFT BREAST COMPARISON:  Previous exam(s). ACR Breast Density Category b: There are scattered areas of fibroglandular density. FINDINGS: Postoperative changes are seen in the left axilla. No suspicious mass or malignant type microcalcifications identified in the left breast. Mammographic images were processed with CAD. On physical exam, I do not palpate a mass in the left axilla. Targeted ultrasound is performed, showing normal tissue in the left axilla. No enlarged adenopathy, mass or abnormal shadowing detected. IMPRESSION: No evidence of malignancy in the left breast. RECOMMENDATION: Bilateral screening mammogram in December of 2018 is recommended. I have discussed the findings and recommendations with the patient. Results were also provided in writing at the conclusion of the visit. If applicable, a reminder letter will be sent to the patient regarding the next appointment. BI-RADS CATEGORY  2: Benign. Electronically Signed   By: Lillia Mountain M.D.   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  The patient also has history of vitamin B 12 deficiency and is taking chronic vitamin B 12 replacement therapy.  Iron deficiency anemia She has new iron deficiency anemia with reactive thrombocytosis The patient is known to have Barrett's esophagus and history of hemorrhoids Due to new findings, I recommend referral to GI and repeat GI evaluation with upper and lower endoscopy. In the meantime, I recommend  oral iron replacement therapy. I plan to repeat blood work again in several weeks and if she cannot tolerate oral iron supplement I will have ineffective replacement therapy with oral supplement, I will pursue intravenous iron replacement therapy.   Orders Placed This Encounter  Procedures  . Ambulatory referral to Gastroenterology    Referral Priority:   Routine    Referral Type:   Consultation    Referral Reason:   Specialty Services Required    Number of Visits Requested:   1   All questions were answered. The patient knows to call the clinic with  any problems, questions or concerns. No barriers to learning was detected. I spent 20 minutes counseling the patient face to face. The total time spent in the appointment was 25 minutes and more than 50% was on counseling and review of test results     Heath Lark, MD 07/17/2016 9:03 AM

## 2016-07-17 NOTE — Assessment & Plan Note (Signed)
She has new iron deficiency anemia with reactive thrombocytosis The patient is known to have Barrett's esophagus and history of hemorrhoids Due to new findings, I recommend referral to GI and repeat GI evaluation with upper and lower endoscopy. In the meantime, I recommend oral iron replacement therapy. I plan to repeat blood work again in several weeks and if she cannot tolerate oral iron supplement I will have ineffective replacement therapy with oral supplement, I will pursue intravenous iron replacement therapy.

## 2016-07-19 ENCOUNTER — Other Ambulatory Visit: Payer: Managed Care, Other (non HMO)

## 2016-07-30 ENCOUNTER — Telehealth: Payer: Self-pay

## 2016-07-30 ENCOUNTER — Telehealth: Payer: Self-pay | Admitting: Hematology and Oncology

## 2016-07-30 NOTE — Telephone Encounter (Signed)
Patient called and left message that she seen by Dr. Alvy Bimler recently.  A couple of tests were supposed to be ordered and she has not heard anything.

## 2016-07-30 NOTE — Telephone Encounter (Signed)
LOS was not worked on: (I will place scheduling msg) 7/5: labs only 7/12: see me at 930 am, 30 mins and IV iron  Please contact GI (I sent a referral)

## 2016-07-30 NOTE — Telephone Encounter (Signed)
Scheduled appt per sch message from NG - left message with appt date and time and sent reminder letter in the mail.

## 2016-07-30 NOTE — Telephone Encounter (Signed)
Left below message. 

## 2016-08-17 ENCOUNTER — Other Ambulatory Visit: Payer: Managed Care, Other (non HMO)

## 2016-08-17 NOTE — Addendum Note (Signed)
Addended by: Caffie Pinto on: 08/17/2016 03:10 PM   Modules accepted: Orders

## 2016-08-19 ENCOUNTER — Other Ambulatory Visit (HOSPITAL_BASED_OUTPATIENT_CLINIC_OR_DEPARTMENT_OTHER): Payer: Managed Care, Other (non HMO)

## 2016-08-19 ENCOUNTER — Other Ambulatory Visit: Payer: Self-pay | Admitting: *Deleted

## 2016-08-19 DIAGNOSIS — D509 Iron deficiency anemia, unspecified: Secondary | ICD-10-CM

## 2016-08-19 LAB — COMPREHENSIVE METABOLIC PANEL
ALBUMIN: 3.5 g/dL (ref 3.5–5.0)
ALK PHOS: 151 U/L — AB (ref 40–150)
ALT: 15 U/L (ref 0–55)
ANION GAP: 9 meq/L (ref 3–11)
AST: 16 U/L (ref 5–34)
BUN: 12.4 mg/dL (ref 7.0–26.0)
CALCIUM: 9.8 mg/dL (ref 8.4–10.4)
CO2: 29 mEq/L (ref 22–29)
CREATININE: 0.8 mg/dL (ref 0.6–1.1)
Chloride: 102 mEq/L (ref 98–109)
EGFR: 79 mL/min/{1.73_m2} — ABNORMAL LOW (ref >90)
Glucose: 90 mg/dl (ref 70–140)
Potassium: 3.3 mEq/L — ABNORMAL LOW (ref 3.5–5.1)
Sodium: 140 mEq/L (ref 136–145)
Total Bilirubin: 0.4 mg/dL (ref 0.20–1.20)
Total Protein: 7 g/dL (ref 6.4–8.3)

## 2016-08-19 LAB — CBC WITH DIFFERENTIAL/PLATELET
BASO%: 0.2 % (ref 0.0–2.0)
BASOS ABS: 0 10*3/uL (ref 0.0–0.1)
EOS%: 1.8 % (ref 0.0–7.0)
Eosinophils Absolute: 0.2 10*3/uL (ref 0.0–0.5)
HEMATOCRIT: 36.4 % (ref 34.8–46.6)
HEMOGLOBIN: 11.3 g/dL — AB (ref 11.6–15.9)
LYMPH#: 2.1 10*3/uL (ref 0.9–3.3)
LYMPH%: 25.3 % (ref 14.0–49.7)
MCH: 24.9 pg — AB (ref 25.1–34.0)
MCHC: 31 g/dL — ABNORMAL LOW (ref 31.5–36.0)
MCV: 80.4 fL (ref 79.5–101.0)
MONO#: 0.7 10*3/uL (ref 0.1–0.9)
MONO%: 8 % (ref 0.0–14.0)
NEUT#: 5.4 10*3/uL (ref 1.5–6.5)
NEUT%: 64.7 % (ref 38.4–76.8)
PLATELETS: 347 10*3/uL (ref 145–400)
RBC: 4.53 10*6/uL (ref 3.70–5.45)
RDW: 18 % — AB (ref 11.2–14.5)
WBC: 8.4 10*3/uL (ref 3.9–10.3)

## 2016-08-20 ENCOUNTER — Other Ambulatory Visit: Payer: Self-pay | Admitting: Hematology and Oncology

## 2016-08-20 LAB — SEDIMENTATION RATE: Sedimentation Rate-Westergren: 29 mm/hr (ref 0–40)

## 2016-08-20 LAB — IRON AND TIBC
%SAT: 8 % — ABNORMAL LOW (ref 21–57)
Iron: 29 ug/dL — ABNORMAL LOW (ref 41–142)
TIBC: 371 ug/dL (ref 236–444)
UIBC: 342 ug/dL (ref 120–384)

## 2016-08-20 LAB — FERRITIN: FERRITIN: 15 ng/mL (ref 9–269)

## 2016-08-23 ENCOUNTER — Telehealth: Payer: Self-pay | Admitting: *Deleted

## 2016-08-23 ENCOUNTER — Other Ambulatory Visit: Payer: Self-pay | Admitting: Hematology and Oncology

## 2016-08-23 NOTE — Telephone Encounter (Signed)
Anna Jackson left message requesting lab results and does she need infusion on Thursday.

## 2016-08-23 NOTE — Telephone Encounter (Signed)
I apologize for not calling her She needs IV iron. Iron study is still low

## 2016-08-23 NOTE — Telephone Encounter (Signed)
LM with note below 

## 2016-08-26 ENCOUNTER — Ambulatory Visit (HOSPITAL_BASED_OUTPATIENT_CLINIC_OR_DEPARTMENT_OTHER): Payer: Managed Care, Other (non HMO)

## 2016-08-26 ENCOUNTER — Encounter: Payer: Self-pay | Admitting: Gastroenterology

## 2016-08-26 ENCOUNTER — Ambulatory Visit (HOSPITAL_BASED_OUTPATIENT_CLINIC_OR_DEPARTMENT_OTHER): Payer: Managed Care, Other (non HMO) | Admitting: Hematology and Oncology

## 2016-08-26 ENCOUNTER — Telehealth: Payer: Self-pay | Admitting: Hematology and Oncology

## 2016-08-26 VITALS — BP 151/91 | HR 80 | Temp 98.1°F | Resp 18 | Ht 68.0 in | Wt 260.8 lb

## 2016-08-26 DIAGNOSIS — D509 Iron deficiency anemia, unspecified: Secondary | ICD-10-CM | POA: Diagnosis not present

## 2016-08-26 DIAGNOSIS — Z8579 Personal history of other malignant neoplasms of lymphoid, hematopoietic and related tissues: Secondary | ICD-10-CM | POA: Diagnosis not present

## 2016-08-26 DIAGNOSIS — I10 Essential (primary) hypertension: Secondary | ICD-10-CM

## 2016-08-26 MED ORDER — SODIUM CHLORIDE 0.9 % IV SOLN
Freq: Once | INTRAVENOUS | Status: AC
  Administered 2016-08-26: 11:00:00 via INTRAVENOUS

## 2016-08-26 MED ORDER — SODIUM CHLORIDE 0.9 % IV SOLN
510.0000 mg | Freq: Once | INTRAVENOUS | Status: AC
  Administered 2016-08-26: 510 mg via INTRAVENOUS
  Filled 2016-08-26: qty 17

## 2016-08-26 NOTE — Patient Instructions (Signed)

## 2016-08-26 NOTE — Telephone Encounter (Signed)
Scheduled appt per 7/12 los - Gave patient AVS and calender per los.  

## 2016-08-28 ENCOUNTER — Encounter: Payer: Self-pay | Admitting: Hematology and Oncology

## 2016-08-28 NOTE — Assessment & Plan Note (Signed)
The most likely cause of her anemia is due to chronic blood loss/malabsorption syndrome. We discussed some of the risks, benefits, and alternatives of intravenous iron infusions. The patient is symptomatic from anemia and the iron level is critically low. She tolerated oral iron supplement poorly and desires to achieved higher levels of iron faster for adequate hematopoesis. Some of the side-effects to be expected including risks of infusion reactions, phlebitis, headaches, nausea and fatigue.  The patient is willing to proceed. Patient education material was dispensed.  Goal is to keep ferritin level greater than 50 I recommend GI evaluation and she agreed to proceed

## 2016-08-28 NOTE — Assessment & Plan Note (Signed)
Examination is unremarkable for disease recurrence I do not feel strongly we need to pursue repeat CT imaging The palpable lump at the left axilla is consistent with prior surgical scar

## 2016-08-28 NOTE — Assessment & Plan Note (Signed)
she will continue current medical management. I recommend close follow-up with primary care doctor for medication adjustment. I also encouraged her to pursue dietary modification and increase exercise activity as tolerated

## 2016-08-28 NOTE — Progress Notes (Signed)
[facility] OFFICE PROGRESS NOTE  Patient Care Team: Carollee Herter, Alferd Apa, DO as PCP - General Fanny Skates, MD as Consulting Physician (General Surgery) Heath Lark, MD as Consulting Physician (Hematology and Oncology)  SUMMARY OF ONCOLOGIC HISTORY: Oncology History   Anaplastic Lymphoma, T-cell, 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)   Staging method: AJCC 7th Edition   Clinical: Stage II signed by Heath Lark, MD on 04/03/2013  3:31 PM   Summary: Stage II      History of lymphoma   02/07/2013 Imaging    [place] confirmed enlarged LN      03/09/2013 Procedure    LN biopsy confirmed T cell lymphoma      03/23/2013 Procedure    She has port placement      03/26/2013 Imaging    Echo showed normal ejection fraction      03/29/2013 Bone Marrow Biopsy    Bone marrow biopsy was negative      04/02/2013 Imaging    PET/CT scan showed localized, stage II disease      04/04/2013 - 07/20/2013 Chemotherapy    She has completed 6 cycles of CHOP plus etoposide chemotherapy.      06/01/2013 Imaging    PET CT scan show complete response to treatment      06/27/2013 Adverse Reaction    Dose of vincristine was reduced due to peripheral neuropathy.      08/31/2013 Imaging    Repeat PET scans show no evidence of disease.      06/10/2014 Imaging    CT chest showed no evidence of recurrence       INTERVAL HISTORY: Please see below for problem oriented charting. He returns for further follow-up She attempted to take oral iron supplement daily but despite this, she remains anemic and iron deficiency The patient denies any recent signs or symptoms of bleeding such as spontaneous epistaxis, hematuria or hematochezia. She denies recent new lymphadenopathy or infection.  She is waiting for GI consult  REVIEW OF SYSTEMS:   Constitutional: Denies fevers, chills or abnormal weight loss Eyes: Denies blurriness of vision Ears, nose, mouth, throat,  and face: Denies mucositis or sore throat Respiratory: Denies cough, dyspnea or wheezes Cardiovascular: Denies palpitation, chest discomfort or lower extremity swelling Gastrointestinal:  Denies nausea, heartburn or change in bowel habits Skin: Denies abnormal skin rashes Lymphatics: Denies new lymphadenopathy or easy bruising Neurological:Denies numbness, tingling or new weaknesses Behavioral/Psych: Mood is stable, no new changes  All other systems were reviewed with the patient and are negative.  I have reviewed the past medical history, past surgical history, social history and family history with the patient and they are unchanged from previous note.  ALLERGIES:  is allergic to betadine [povidone iodine] and codeine.  MEDICATIONS:  Current Outpatient Prescriptions  Medication Sig Dispense Refill  . albuterol (PROAIR HFA) 108 (90 Base) MCG/ACT inhaler 2 puffs q6h prn 1 Inhaler 2  . beclomethasone (QVAR) 40 MCG/ACT inhaler Inhale 2 puffs into the lungs 2 (two) times daily. 1 Inhaler 12  . Cholecalciferol (VITAMIN D) 2000 units tablet Take 2,000 Units by mouth daily.    Marland Kitchen FLUoxetine (PROZAC) 20 MG tablet Take 1 tablet (20 mg total) by mouth daily. 90 tablet 2  . hydrochlorothiazide (HYDRODIURIL) 25 MG tablet Take 1 tablet (25 mg total) by mouth daily. 90 tablet 2  . omeprazole (PRILOSEC OTC) 20 MG tablet Take 1 tablet (20 mg total) by mouth daily as needed. [place]  tablet 10  . potassium chloride SA (K-DUR,KLOR-CON) 20 MEQ tablet Take 1 tablet (20 mEq total) by mouth daily. (Patient not taking: Reported on 07/16/2016) 30 tablet 2  . valACYclovir (VALTREX) 1000 MG tablet Take 1 tablet (1,000 mg total) by mouth 3 (three) times daily. (Patient taking differently: Take 1,000 mg by mouth 3 (three) times daily as needed. ) 90 tablet 1  . vitamin B-12 (CYANOCOBALAMIN) 1000 MCG tablet Take 1,000 mcg by mouth daily.     No current facility-administered medications for this visit.     PHYSICAL  EXAMINATION: ECOG PERFORMANCE STATUS: 0 - Asymptomatic  Vitals:   08/26/16 0945  BP: (!) 151/91  Pulse: 80  Resp: 18  Temp: 98.1 F (36.7 C)   Filed Weights   08/26/16 0945  Weight: 260 lb 12.8 oz (118.3 kg)    GENERAL:alert, no distress and comfortable SKIN: skin color, texture, turgor are normal, no rashes or significant lesions EYES: normal, Conjunctiva are pink and non-injected, sclera clear OROPHARYNX:no exudate, no erythema and lips, buccal mucosa, and tongue normal  NECK: supple, thyroid normal size, non-tender, without nodularity LYMPH:  no palpable lymphadenopathy in the cervical, axillary or inguinal LUNGS: clear to auscultation and percussion with normal breathing effort HEART: regular rate & rhythm and no murmurs and no lower extremity edema ABDOMEN:abdomen soft, non-tender and normal bowel sounds Musculoskeletal:no cyanosis of digits and no clubbing  NEURO: alert & oriented x 3 with fluent speech, no focal motor/sensory deficits  LABORATORY DATA:  I have reviewed the data as listed    Component Value Date/Time   NA 140 08/19/2016 1558   K 3.3 (L) 08/19/2016 1558   CL 102 02/17/2016 0857   CO2 29 08/19/2016 1558   GLUCOSE 90 08/19/2016 1558   BUN 12.4 08/19/2016 1558   CREATININE 0.8 08/19/2016 1558   CALCIUM 9.8 08/19/2016 1558   PROT 7.0 08/19/2016 1558   ALBUMIN 3.5 08/19/2016 1558   AST 16 08/19/2016 1558   ALT 15 08/19/2016 1558   ALKPHOS 151 (H) 08/19/2016 1558   BILITOT 0.40 08/19/2016 1558   GFRNONAA 77 (L) 03/23/2013 0953   GFRAA 90 (L) 03/23/2013 0953    No results found for: SPEP, UPEP  Lab Results  Component Value Date   WBC 8.4 08/19/2016   NEUTROABS 5.4 08/19/2016   HGB 11.3 (L) 08/19/2016   HCT 36.4 08/19/2016   MCV 80.4 08/19/2016   PLT 347 08/19/2016      Chemistry      Component Value Date/Time   NA 140 08/19/2016 1558   K 3.3 (L) 08/19/2016 1558   CL 102 02/17/2016 0857   CO2 29 08/19/2016 1558   BUN 12.4 08/19/2016  1558   CREATININE 0.8 08/19/2016 1558      Component Value Date/Time   CALCIUM 9.8 08/19/2016 1558   ALKPHOS 151 (H) 08/19/2016 1558   AST 16 08/19/2016 1558   ALT 15 08/19/2016 1558   BILITOT 0.40 08/19/2016 1558       ASSESSMENT & PLAN:  History of lymphoma Examination is unremarkable for disease recurrence I do not feel strongly we need to pursue repeat CT imaging The palpable lump at the left axilla is consistent with prior surgical scar  Iron deficiency anemia The most likely cause of her anemia is due to chronic blood loss/malabsorption syndrome. We discussed some of the risks, benefits, and alternatives of intravenous iron infusions. The patient is symptomatic from anemia and the iron level is critically low. She tolerated oral iron  supplement poorly and desires to achieved higher levels of iron faster for adequate hematopoesis. Some of the side-effects to be expected including risks of infusion reactions, phlebitis, headaches, nausea and fatigue.  The patient is willing to proceed. Patient education material was dispensed.  Goal is to keep ferritin level greater than 50 I recommend GI evaluation and she agreed to proceed   Essential hypertension she will continue current medical management. I recommend close follow-up with primary care doctor for medication adjustment. I also encouraged her to pursue dietary modification and increase exercise activity as tolerated   Orders Placed This Encounter  Procedures  . CBC & Diff and Retic    Standing Status:   Future    Standing Expiration Date:   09/30/2017  . Comprehensive metabolic panel    Standing Status:   Future    Standing Expiration Date:   09/30/2017  . Ferritin    Standing Status:   Future    Standing Expiration Date:   08/26/2017   All questions were answered. The patient knows to call the clinic with any problems, questions or concerns. No barriers to learning was detected. I spent 10 minutes counseling the  patient face to face. The total time spent in the appointment was 15 minutes and more than 50% was on counseling and review of test results     Heath Lark, MD 08/28/2016 9:33 AM

## 2016-08-30 ENCOUNTER — Other Ambulatory Visit: Payer: Self-pay | Admitting: Hematology and Oncology

## 2016-08-30 ENCOUNTER — Telehealth: Payer: Self-pay | Admitting: Hematology and Oncology

## 2016-08-30 ENCOUNTER — Telehealth: Payer: Self-pay

## 2016-08-30 NOTE — Telephone Encounter (Signed)
Scheduled appt per sch message from Darliss Ridgel - patient is aware of appt time and date.

## 2016-08-30 NOTE — Telephone Encounter (Signed)
Patient called and left message, she is trying to schedule her 2nd IV iron appt. Her first was 7-12.

## 2016-08-30 NOTE — Telephone Encounter (Signed)
Second dose is non-urgent Can you call Erline Levine or scheduler to scheduler next Friday?

## 2016-09-10 ENCOUNTER — Ambulatory Visit (HOSPITAL_BASED_OUTPATIENT_CLINIC_OR_DEPARTMENT_OTHER): Payer: Managed Care, Other (non HMO)

## 2016-09-10 ENCOUNTER — Ambulatory Visit: Payer: Managed Care, Other (non HMO)

## 2016-09-10 VITALS — BP 148/87 | HR 78 | Temp 97.6°F | Resp 17

## 2016-09-10 DIAGNOSIS — Z8579 Personal history of other malignant neoplasms of lymphoid, hematopoietic and related tissues: Secondary | ICD-10-CM

## 2016-09-10 DIAGNOSIS — D509 Iron deficiency anemia, unspecified: Secondary | ICD-10-CM | POA: Diagnosis not present

## 2016-09-10 MED ORDER — SODIUM CHLORIDE 0.9 % IV SOLN
510.0000 mg | Freq: Once | INTRAVENOUS | Status: AC
  Administered 2016-09-10: 510 mg via INTRAVENOUS
  Filled 2016-09-10: qty 17

## 2016-09-10 MED ORDER — SODIUM CHLORIDE 0.9 % IV SOLN
Freq: Once | INTRAVENOUS | Status: AC
  Administered 2016-09-10: 16:00:00 via INTRAVENOUS

## 2016-09-10 NOTE — Patient Instructions (Signed)

## 2016-10-14 ENCOUNTER — Ambulatory Visit (INDEPENDENT_AMBULATORY_CARE_PROVIDER_SITE_OTHER): Payer: Managed Care, Other (non HMO) | Admitting: Gastroenterology

## 2016-10-14 ENCOUNTER — Encounter: Payer: Self-pay | Admitting: Gastroenterology

## 2016-10-14 VITALS — BP 118/84 | HR 72 | Ht 68.0 in | Wt 258.4 lb

## 2016-10-14 DIAGNOSIS — K2270 Barrett's esophagus without dysplasia: Secondary | ICD-10-CM

## 2016-10-14 DIAGNOSIS — D509 Iron deficiency anemia, unspecified: Secondary | ICD-10-CM

## 2016-10-14 DIAGNOSIS — K219 Gastro-esophageal reflux disease without esophagitis: Secondary | ICD-10-CM

## 2016-10-14 DIAGNOSIS — K648 Other hemorrhoids: Secondary | ICD-10-CM

## 2016-10-14 DIAGNOSIS — K625 Hemorrhage of anus and rectum: Secondary | ICD-10-CM

## 2016-10-14 MED ORDER — NA SULFATE-K SULFATE-MG SULF 17.5-3.13-1.6 GM/177ML PO SOLN: 1.0000 | Freq: Once | ORAL | 0 refills | Status: AC

## 2016-10-14 NOTE — Progress Notes (Signed)
[facility] Consult Note:  History: Anna Jackson 10/14/2016  Referring physician: Heath Lark, MD  Reason for consult/chief complaint: Anemia (Oncologist wanted pt seen. S/P two iron infusions in July); Barretts Esophagus; and Gastroesophageal Reflux (Can be really bad at times. has to take Prevacid OTC)   Subjective  HPI:  This is a 56 year old woman referred by her oncologist noted above for iron deficiency anemia. She was noted several months ago to have microcytic anemia with a low ferritin. She was unable to tolerate oral iron therapy, and thus far has had 2 treatments of IV iron. She describes occasional low volume rectal bleeding and thinks it is recurrence of previous hemorrhoids. Sometimes she also has passage of black stool. For the last few weeks she has had episodes of vague brief crampy upper abdominal pain that is nonradiating with no clear triggers, relieving factors or associated symptoms. She denies dysphagia, nausea or vomiting. She will have pyrosis if she does not take her PPI regularly. Misako denies aspirin use, and rarely uses NSAIDs. She tends toward constipation as well since her lymphoma chemotherapy a few years back. Shaili was last seen by Dr. Deatra Ina 6 years ago, when Barrett's esophagus was noted on EGD. The note does not indicate the length of Barrett's, and there is one pathology bottle of specimens showing intestinal metaplasia with no dysplasia. A colonoscopy at that time only revealed internal hemorrhoids, and banding was performed during that procedure. She reports relief of the rectal bleeding until perhaps earlier this year.  ROS:  Review of Systems  Constitutional: Positive for fatigue. Negative for appetite change and unexpected weight change.  HENT: Negative for mouth sores and voice change.   Eyes: Negative for pain and redness.  Respiratory: Positive for cough. Negative for shortness of breath.   Cardiovascular: Negative for  chest pain and palpitations.  Genitourinary: Negative for dysuria and hematuria.  Musculoskeletal: Negative for arthralgias and myalgias.  Skin: Negative for pallor and rash.  Neurological: Negative for weakness and headaches.  Hematological: Negative for adenopathy.     Past Medical History: Past Medical History:  Diagnosis Date  . Adenopathy    left axillary  . Anemia   . Anxiety   . Anxiety state, unspecified   . Asthma    no problem with asthma in several years  . Barrett esophagus   . Depression   . Esophageal reflux   . Headache 05/16/2013  . Herpes simplex without mention of complication   . Hyperlipidemia 03/12/2014  . Hypertension   . 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 Mental disorder   . Neuropathic pain 04/04/2014  . Non Hodgkin's lymphoma ([facility]) 2015  . Skin infection 11/02/2013  . Unspecified asthma(493.90)   . Unspecified essential hypertension   . URI (upper respiratory infection) 01/15/2014     Past Surgical History: Past Surgical History:  Procedure Laterality Date  . ABDOMINAL HYSTERECTOMY  2006  . AXILLARY LYMPH NODE DISSECTION Left 03/09/2013   Procedure: EXCISION LEFT AXILLARY LYMPH NODES;  Surgeon: [name], MD;  Location: [facility];  Service: General;  Laterality: Left;  . BREAST BIOPSY Left 02/13/2013   high risk  . BREAST EXCISIONAL BIOPSY Left 03/09/2013   high risk  . CERVICAL CONIZATION W/BX    . CHOLECYSTECTOMY     with hernia repair  . KNEE SURGERY  02/2010   Right--arthroscopic  . NISSEN FUNDOPLICATION    . PORTACATH PLACEMENT N/A 03/23/2013   Procedure: INSERTION PORT-A-CATH;  Surgeon: Edsel Petrin  Dalbert Batman, MD;  Location: [facility];  Service: General;  Laterality: N/A;  . TUBAL LIGATION       Family History: Family History  Problem Relation Age of Onset  . Heart disease Mother   . Coronary artery disease Unknown        with hernia repair  . Migraines Unknown   . Hypertension Unknown   . Breast cancer Maternal Grandmother 45  .  Cancer Maternal Grandmother 50       breast  . Heart disease Brother   . Colon cancer Neg Hx   No GI malignancies, no celiac  Social History: Social History   Social History  . Marital status: Single    Spouse name: N/A  . Number of children: 1  . Years of education: N/A   Occupational History  . [employer]   Social History Main Topics  . Smoking status: Never Smoker  . Smokeless tobacco: Never Used  . Alcohol use Yes     Comment: occasional  . Drug use: No  . Sexual activity: Not Currently    Partners: Male   Other Topics Concern  . None   Social History Narrative   Exercise--  treadmill    Allergies: Allergies  Allergen Reactions  . Betadine [Povidone Iodine] Hives and Itching  . Codeine     REACTION: SICK-NAUSEATED    Outpatient Meds: Current Outpatient Prescriptions  Medication Sig Dispense Refill  . albuterol (PROAIR HFA) 108 (90 Base) MCG/ACT inhaler 2 puffs q6h prn 1 Inhaler 2  . beclomethasone (QVAR) 40 MCG/ACT inhaler Inhale 2 puffs into the lungs 2 (two) times daily. 1 Inhaler 12  . Cholecalciferol (VITAMIN D) 2000 units tablet Take 2,000 Units by mouth daily.    Marland Kitchen FLUoxetine (PROZAC) 20 MG tablet Take 1 tablet (20 mg total) by mouth daily. 90 tablet 2  . hydrochlorothiazide (HYDRODIURIL) 25 MG tablet Take 1 tablet (25 mg total) by mouth daily. 90 tablet 2  . omeprazole (PRILOSEC OTC) 20 MG tablet Take 1 tablet (20 mg total) by mouth daily as needed. 30 tablet 10  . potassium chloride SA (K-DUR,KLOR-CON) 20 MEQ tablet Take 1 tablet (20 mEq total) by mouth daily. 30 tablet 2  . valACYclovir (VALTREX) 1000 MG tablet Take 1 tablet (1,000 mg total) by mouth 3 (three) times daily. (Patient taking differently: Take 1,000 mg by mouth 3 (three) times daily as needed. ) 90 tablet 1  . vitamin B-12 (CYANOCOBALAMIN) 1000 MCG tablet Take 1,000 mcg by mouth daily.    . Na Sulfate-K Sulfate-Mg Sulf 17.5-3.13-1.6 GM/180ML SOLN Take 1 kit by mouth  once. 354 mL 0   No current facility-administered medications for this visit.       ___________________________________________________________________ Objective   Exam:  BP 118/84   Pulse 72   Ht '5\' 8"'  (1.727 m)   Wt 258 lb 6 oz (117.2 kg)   BMI 39.29 kg/m    General: this is a(n) Well-appearing woman   Eyes: sclera anicteric, no redness  ENT: oral mucosa moist without lesions, no cervical or supraclavicular lymphadenopathy, good dentition  CV: RRR without murmur, S1/S2, no JVD, no peripheral edema  Resp: clear to auscultation bilaterally, normal RR and effort noted  GI: soft, no tenderness, with active bowel sounds. No guarding or palpable organomegaly noted.  Skin; warm and dry, no rash or jaundice noted  Neuro: awake, alert and oriented x 3. Normal gross motor function and fluent speech  Labs:  CBC Latest Ref Rng &  Units 08/19/2016 07/15/2016 07/13/2016  WBC 3.9 - 10.3 10e3/uL 8.4 8.6 7.0  Hemoglobin 11.6 - 15.9 g/dL 11.3(L) 10.2(L) 10.7(L)  Hematocrit 34.8 - 46.6 % 36.4 33.6(L) 33.3(L)  Platelets 145 - 400 10e3/uL 347 421(H) 520.0(H)   Hgb 11.4 in Nov 2017 (nml MCV)  May ferritin 8, in July ferritin 15  Radiologic Studies:  CXR April - large HH  Assessment: Encounter Diagnoses  Name Primary?  . Iron deficiency anemia, unspecified iron deficiency anemia type Yes  . Barrett's esophagus without dysplasia   . Gastroesophageal reflux disease without esophagitis   . Rectal bleed   . Hemorrhoids, internal     We discussed how persistent iron deficiency anemia can be from chronic GI blood loss and/or small bowel iron malabsorption. As such, EGD and colonoscopy is advised, and she is agreeable. Her Barrett's esophagus also needs endoscopic surveillance and biopsies. A chest x-ray suggests a large hiatal hernia despite previous fundoplication. It sounds as if that surgery was done many years ago, and no operative report is available to determine if the  diaphragmatic defect was repaired during that surgery. The bleeding sounds likely to be hemorrhoidal in nature, we will find out at upcoming colonoscopy Her reflux symptoms are under good control with meds.  Dawne is due to see her oncologist next month and believes that follow-up blood counts and iron levels will be checked.  Thank you for the courtesy of this consult.  Please call me with any questions or concerns.  Nelida Meuse III  CC: Ann Held, DO Heath Lark, MD

## 2016-10-14 NOTE — Patient Instructions (Addendum)
Miralax powder - one half to one full capful in a glass of liquid once daily to relieve constipation.  Continue stool softener.  If you are age 54 or older, your body mass index should be between 23-30. Your Body mass index is 39.29 kg/m. If this is out of the aforementioned range listed, please consider follow up with your Primary Care Provider.  If you are age 45 or younger, your body mass index should be between 19-25. Your Body mass index is 39.29 kg/m. If this is out of the aformentioned range listed, please consider follow up with your Primary Care Provider.   You have been scheduled for an endoscopy and colonoscopy. Please follow the written instructions given to you at your visit today. Please pick up your prep supplies at the pharmacy within the next 1-3 days. If you use inhalers (even only as needed), please bring them with you on the day of your procedure. Your physician has requested that you go to www.startemmi.com and enter the access code given to you at your visit today. This web site gives a general overview about your procedure. However, you should still follow specific instructions given to you by our office regarding your preparation for the procedure.  Thank you for choosing [facility]  Dr Wilfrid Lund III

## 2016-11-11 ENCOUNTER — Encounter: Payer: Self-pay | Admitting: Gastroenterology

## 2016-11-11 ENCOUNTER — Ambulatory Visit (AMBULATORY_SURGERY_CENTER): Payer: Managed Care, Other (non HMO) | Admitting: Gastroenterology

## 2016-11-11 VITALS — BP 134/80 | HR 72 | Temp 97.8°F | Resp 21 | Ht 68.0 in | Wt 258.0 lb

## 2016-11-11 DIAGNOSIS — K2270 Barrett's esophagus without dysplasia: Secondary | ICD-10-CM | POA: Diagnosis not present

## 2016-11-11 DIAGNOSIS — D509 Iron deficiency anemia, unspecified: Secondary | ICD-10-CM

## 2016-11-11 DIAGNOSIS — K649 Unspecified hemorrhoids: Secondary | ICD-10-CM

## 2016-11-11 DIAGNOSIS — K625 Hemorrhage of anus and rectum: Secondary | ICD-10-CM

## 2016-11-11 MED ORDER — SODIUM CHLORIDE 0.9 % IV SOLN: 500.0000 mL | INTRAVENOUS | Status: DC

## 2016-11-11 NOTE — Patient Instructions (Signed)
** Handout given on Hiatal Hernia ** Biopsy results will be Anna Jackson in 1-3 weeks. Make appt to return in office after biopsy results come Anna Jackson.   YOU HAD AN ENDOSCOPIC PROCEDURE TODAY AT [facility]:   Refer to the procedure report that was given to you for any specific questions about what was found during the examination.  If the procedure report does not answer your questions, please call your gastroenterologist to clarify.  If you requested that your care partner not be given the details of your procedure findings, then the procedure report has been included in a sealed envelope for you to review at your convenience later.  YOU SHOULD EXPECT: Some feelings of bloating in the abdomen. Passage of more gas than usual.  Walking can help get rid of the air that was put into your GI tract during the procedure and reduce the bloating. If you had a lower endoscopy (such as a colonoscopy or flexible sigmoidoscopy) you may notice spotting of blood in your stool or on the toilet paper. If you underwent a bowel prep for your procedure, you may not have a normal bowel movement for a few days.  Please Note:  You might notice some irritation and congestion in your nose or some drainage.  This is from the oxygen used during your procedure.  There is no need for concern and it should clear up in a day or so.  SYMPTOMS TO REPORT IMMEDIATELY:   Following lower endoscopy (colonoscopy or flexible sigmoidoscopy):  Excessive amounts of blood in the stool  Significant tenderness or worsening of abdominal pains  Swelling of the abdomen that is new, acute  Fever of 100F or higher   Following upper endoscopy (EGD)  Vomiting of blood or coffee ground material  New chest pain or pain under the shoulder blades  Painful or persistently difficult swallowing  New shortness of breath  Fever of 100F or higher  Black, tarry-looking stools  For urgent or emergent issues, a gastroenterologist can be reached  at any hour by calling [phone].   DIET:  We do recommend a small meal at first, but then you may proceed to your regular diet.  Drink plenty of fluids but you should avoid alcoholic beverages for 24 hours.  ACTIVITY:  You should plan to take it easy for the rest of today and you should NOT DRIVE or use heavy machinery until tomorrow (because of the sedation medicines used during the test).    FOLLOW UP: Our staff will call the number listed on your records the next business day following your procedure to check on you and address any questions or concerns that you may have regarding the information given to you following your procedure. If we do not reach you, we will leave a message.  However, if you are feeling well and you are not experiencing any problems, there is no need to return our call.  We will assume that you have returned to your regular daily activities without incident.  If any biopsies were taken you will be contacted by phone or by letter within the next 1-3 weeks.  Please call us at [phone] if you have not heard about the biopsies in 3 weeks.    SIGNATURES/CONFIDENTIALITY: You and/or your care partner have signed paperwork which will be entered into your electronic medical record.  These signatures attest to the fact that that the information above on your After Visit Summary has been reviewed and is understood.  Full responsibility of the confidentiality of this discharge information lies with you and/or your care-partner.

## 2016-11-11 NOTE — Op Note (Signed)
[place] Patient Name: Anna Jackson Procedure Date: 11/11/2016 2:20 PM MRN: [id number] Endoscopist: Mallie Mussel L. Loletha Carrow , MD Age: 56 Referring MD:  Date of Birth: 1960-12-20 Gender: Female Account #: [id number] Procedure:                Upper GI endoscopy Indications:              Iron deficiency anemia, Barrett's esophagus Medicines:                Monitored Anesthesia Care Procedure:                Pre-Anesthesia Assessment:                           - Prior to the procedure, a History and Physical                            was performed, and patient medications and                            allergies were reviewed. The patient's tolerance of                            previous anesthesia was also reviewed. The risks                            and benefits of the procedure and the sedation                            options and risks were discussed with the patient.                            All questions were answered, and informed consent                            was obtained. Prior Anticoagulants: The patient has                            taken no previous anticoagulant or antiplatelet                            agents. ASA Grade Assessment: II - A patient with                            mild systemic disease. After reviewing the risks                            and benefits, the patient was deemed in                            satisfactory condition to undergo the procedure.                           After obtaining informed consent, the endoscope was  passed under direct vision. Throughout the                            procedure, the patient's blood pressure, pulse, and                            oxygen saturations were monitored continuously. The                            Model GIF-HQ190 [id number]) scope was introduced                            through the mouth, and advanced to the second part                            of  duodenum. The upper GI endoscopy was                            accomplished without difficulty. The patient                            tolerated the procedure well. Scope In: Scope Out: Findings:                 There were esophageal mucosal changes secondary to                            established long-segment Barrett's disease present                            in the lower third of the esophagus. The maximum                            longitudinal extent of these mucosal changes was 6                            cm in length. Mucosa was biopsied with a cold                            forceps for histology in 4 quadrants at intervals                            of 2 cm at 26, 28, 30 and 32 cm from the incisors.                            A total of 4 specimen bottles were sent to                            pathology. There were no raised or otherwise                            suspicious areas within the Barrett's under WL or  NBI.                           A large hiatal hernia was present. Approximately                            half of the stomach in involved                           Evidence of a fundoplication was found in the                            cardia. The wrap appeared intact.                           The cardia and gastric fundus were normal on                            retroflexion.                           The examined duodenum was normal. Six biopsies for                            histology were taken with a cold forceps for                            evaluation of celiac disease. Complications:            No immediate complications. Estimated Blood Loss:     Estimated blood loss was minimal. Impression:               - Esophageal mucosal changes secondary to                            established long-segment Barrett's disease.                            Biopsied.                           - Large hiatal hernia.                            - A fundoplication was found. The wrap appears                            intact.                           - Normal examined duodenum. Biopsied. Recommendation:           - Patient has a contact number available for                            emergencies. The signs and symptoms of potential  delayed complications were discussed with the                            patient. Return to normal activities tomorrow.                            Written discharge instructions were provided to the                            patient.                           - Resume previous diet.                           - Continue present medications.                           - Await pathology results.                           - See the other procedure note for documentation of                            additional recommendations. Selso Mannor L. Loletha Carrow, MD 11/11/2016 3:05:32 PM This report has been signed electronically.

## 2016-11-11 NOTE — Progress Notes (Signed)
Called to room to assist during endoscopic procedure.  Patient ID and intended procedure confirmed with present staff. Received instructions for my participation in the procedure from the performing physician.  

## 2016-11-11 NOTE — Op Note (Signed)
[place] Patient Name: Anna Jackson Procedure Date: 11/11/2016 2:19 PM MRN: [id number] Endoscopist: Mallie Mussel L. Loletha Carrow , MD Age: 56 Referring MD:  Date of Birth: 10-02-1960 Gender: Female Account #: [id number] Procedure:                Colonoscopy Indications:              Rectal bleeding, Iron deficiency anemia Medicines:                Monitored Anesthesia Care Procedure:                Pre-Anesthesia Assessment:                           - Prior to the procedure, a History and Physical                            was performed, and patient medications and                            allergies were reviewed. The patient's tolerance of                            previous anesthesia was also reviewed. The risks                            and benefits of the procedure and the sedation                            options and risks were discussed with the patient.                            All questions were answered, and informed consent                            was obtained. Prior Anticoagulants: The patient has                            taken no previous anticoagulant or antiplatelet                            agents. ASA Grade Assessment: II - A patient with                            mild systemic disease. After reviewing the risks                            and benefits, the patient was deemed in                            satisfactory condition to undergo the procedure.                           After obtaining informed consent, the colonoscope  was passed under direct vision. Throughout the                            procedure, the patient's blood pressure, pulse, and                            oxygen saturations were monitored continuously. The                            Model [id number]-[id number]) scope was introduced                            through the anus and advanced to the the terminal                            ileum. The  colonoscopy was performed without                            difficulty. The patient tolerated the procedure                            well. The quality of the bowel preparation was                            excellent. The terminal ileum, ileocecal valve,                            appendiceal orifice, and rectum were photographed.                            The quality of the bowel preparation was evaluated                            using the BBPS [facility] Bowel Preparation Scale)                            with scores of: Right Colon = 3, Transverse Colon =                            3 and Left Colon = 3 (entire mucosa seen well with                            no residual staining, small fragments of stool or                            opaque liquid). The total BBPS score equals 9. The                            bowel preparation used was SUPREP. Scope In: 2:47:41 PM Scope Out: 2:58:55 PM Scope Withdrawal Time: 0 hours 8 minutes 23 seconds  Total Procedure Duration: 0 hours 11 minutes 14 seconds  Findings:  The perianal exam findings include internal                            hemorrhoids that prolapse with straining, but                            spontaneously regress to the resting position                            (Grade II).                           The terminal ileum appeared normal.                           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                           Internal hemorrhoids were found. The hemorrhoids                            were Grade II (internal hemorrhoids that prolapse                            but reduce spontaneously).                           The exam was otherwise without abnormality on                            direct and retroflexion views. Complications:            No immediate complications. Estimated Blood Loss:     Estimated blood loss: none. Impression:               - Internal hemorrhoids that prolapse  with                            straining, but spontaneously regress to the resting                            position (Grade II) found on perianal exam.                           - The examined portion of the ileum was normal.                           - Lipoma in the proximal ascending colon.                           - Internal hemorrhoids.                           - The examination was otherwise normal on direct  and retroflexion views.                           - No specimens collected. Recommendation:           - Patient has a contact number available for                            emergencies. The signs and symptoms of potential                            delayed complications were discussed with the                            patient. Return to normal activities tomorrow.                            Written discharge instructions were provided to the                            patient.                           - Resume previous diet.                           - Continue present medications.                           - Repeat colonoscopy in 10 years for screening                            purposes.                           - Return to my office, timing to TBD pending biopsy                            results. If no celiac sprue on biopsies, stool                            cards to check for occult blood will be ordered. If                            positive,proceed to small bowel video capsule                            study. Larhe hiatal hernia and symptomatic                            hemorhoids to be addressed further at that time. Britnie Colville L. Loletha Carrow, MD 11/11/2016 3:10:36 PM This report has been signed electronically.

## 2016-11-11 NOTE — Progress Notes (Signed)
Pt's states no medical or surgical changes since previsit or office visit. 

## 2016-11-11 NOTE — Progress Notes (Signed)
Report to PACU, RN, vss, BBS= Clear.  

## 2016-11-12 ENCOUNTER — Telehealth: Payer: Self-pay | Admitting: *Deleted

## 2016-11-12 NOTE — Telephone Encounter (Signed)
  Follow up Call-  Call back number 11/11/2016  Post procedure Call Back phone  # [phone]  Permission to leave phone message No  Some recent data might be hidden    No answer at # given.  Did not leave VM per patients instructionsl

## 2016-11-15 ENCOUNTER — Telehealth: Payer: Self-pay | Admitting: *Deleted

## 2016-11-15 NOTE — Telephone Encounter (Signed)
No answer,message not left per request.

## 2016-11-18 ENCOUNTER — Other Ambulatory Visit (HOSPITAL_BASED_OUTPATIENT_CLINIC_OR_DEPARTMENT_OTHER): Payer: Managed Care, Other (non HMO)

## 2016-11-18 ENCOUNTER — Telehealth: Payer: Self-pay

## 2016-11-18 DIAGNOSIS — D509 Iron deficiency anemia, unspecified: Secondary | ICD-10-CM | POA: Diagnosis not present

## 2016-11-18 LAB — CBC & DIFF AND RETIC
BASO%: 0.2 % (ref 0.0–2.0)
Basophils Absolute: 0 10*3/uL (ref 0.0–0.1)
EOS%: 2 % (ref 0.0–7.0)
Eosinophils Absolute: 0.1 10*3/uL (ref 0.0–0.5)
HCT: 38.6 % (ref 34.8–46.6)
HGB: 12.4 g/dL (ref 11.6–15.9)
Immature Retic Fract: 7 % (ref 1.60–10.00)
LYMPH%: 21 % (ref 14.0–49.7)
MCH: 28.4 pg (ref 25.1–34.0)
MCHC: 32.1 g/dL (ref 31.5–36.0)
MCV: 88.5 fL (ref 79.5–101.0)
MONO#: 0.4 10*3/uL (ref 0.1–0.9)
MONO%: 6.4 % (ref 0.0–14.0)
NEUT#: 4.6 10*3/uL (ref 1.5–6.5)
NEUT%: 70.4 % (ref 38.4–76.8)
Platelets: 315 10*3/uL (ref 145–400)
RBC: 4.36 10*6/uL (ref 3.70–5.45)
RDW: 15.2 % — ABNORMAL HIGH (ref 11.2–14.5)
Retic %: 2.18 % — ABNORMAL HIGH (ref 0.70–2.10)
Retic Ct Abs: 95.05 10*3/uL — ABNORMAL HIGH (ref 33.70–90.70)
WBC: 6.6 10*3/uL (ref 3.9–10.3)
lymph#: 1.4 10*3/uL (ref 0.9–3.3)

## 2016-11-18 LAB — COMPREHENSIVE METABOLIC PANEL
ALT: 19 U/L (ref 0–55)
ANION GAP: 9 meq/L (ref 3–11)
AST: 14 U/L (ref 5–34)
Albumin: 3.5 g/dL (ref 3.5–5.0)
Alkaline Phosphatase: 143 U/L (ref 40–150)
BUN: 14.1 mg/dL (ref 7.0–26.0)
CHLORIDE: 106 meq/L (ref 98–109)
CO2: 25 meq/L (ref 22–29)
Calcium: 9.5 mg/dL (ref 8.4–10.4)
Creatinine: 0.8 mg/dL (ref 0.6–1.1)
EGFR: 89 mL/min/{1.73_m2} — AB (ref >90)
Glucose: 90 mg/dl (ref 70–140)
Potassium: 4 mEq/L (ref 3.5–5.1)
SODIUM: 141 meq/L (ref 136–145)
Total Bilirubin: 0.46 mg/dL (ref 0.20–1.20)
Total Protein: 6.7 g/dL (ref 6.4–8.3)

## 2016-11-18 LAB — FERRITIN: Ferritin: 49 ng/ml (ref 9–269)

## 2016-11-18 NOTE — Telephone Encounter (Signed)
-----   Message from Heath Lark, MD sent at 11/18/2016  9:55 AM EDT ----- Regarding: labs pls call her Labs and iron studies are good If OK with her, cancel appt and infusion next week Place new scheduling msg for labs, see me and 15 mins appt in 3 months ----- Message ----- From: Interface, Lab In Three Zero One Sent: 11/18/2016   8:06 AM To: Heath Lark, MD

## 2016-11-18 NOTE — Telephone Encounter (Signed)
Called with below message. Ask her to call nurse.

## 2016-11-19 ENCOUNTER — Other Ambulatory Visit: Payer: Self-pay

## 2016-11-19 ENCOUNTER — Other Ambulatory Visit: Payer: Managed Care, Other (non HMO)

## 2016-11-19 DIAGNOSIS — D649 Anemia, unspecified: Secondary | ICD-10-CM

## 2016-11-19 NOTE — Telephone Encounter (Signed)
Called and left another message

## 2016-11-22 ENCOUNTER — Telehealth: Payer: Self-pay | Admitting: *Deleted

## 2016-11-22 NOTE — Telephone Encounter (Signed)
No answer

## 2016-11-23 ENCOUNTER — Telehealth: Payer: Self-pay | Admitting: Hematology and Oncology

## 2016-11-23 ENCOUNTER — Telehealth: Payer: Self-pay | Admitting: *Deleted

## 2016-11-23 NOTE — Telephone Encounter (Signed)
Called patient regarding January 2019 °

## 2016-11-23 NOTE — Telephone Encounter (Signed)
Notified that iron studies are good. Will cancel appts this week and reschedule for 3 months. Msg to scheduler

## 2016-11-26 ENCOUNTER — Ambulatory Visit: Payer: Managed Care, Other (non HMO)

## 2016-11-26 ENCOUNTER — Ambulatory Visit: Payer: Managed Care, Other (non HMO) | Admitting: Hematology and Oncology

## 2016-12-01 ENCOUNTER — Other Ambulatory Visit: Payer: Self-pay

## 2016-12-01 DIAGNOSIS — R195 Other fecal abnormalities: Secondary | ICD-10-CM

## 2016-12-16 ENCOUNTER — Telehealth: Payer: Self-pay | Admitting: Gastroenterology

## 2016-12-16 NOTE — Telephone Encounter (Signed)
Patient states she has had chest pain for the last 3 days and she went to urgent care where they did a chest xray and found a mass. Pt states they told her to contact our office bc the mass has something to do with a hiatal hernia. Pt wants advice until her appt on 01/05/17.

## 2016-12-16 NOTE — Telephone Encounter (Signed)
Pt called UC and they told her we would have to call to request the records #[phone]

## 2016-12-16 NOTE — Telephone Encounter (Signed)
Spoke with Dr. Carlean Purl, he reviewed urgent care records, no mass seen, patient has large hiatal hernia which can cause chest pain. He suggests she keep follow up visit with Dr. Loletha Carrow. Left message for patient with this information. Will put records on Dr. Loletha Carrow' desk for review.

## 2016-12-16 NOTE — Telephone Encounter (Signed)
Patient of Dr. Loletha Carrow, routed to DOD, received records from [facility], placed these in Dr. Celesta Aver inbox for review. CXR impression: No active disease. Large hiatal hernia. Please see recent EGD report. Patient has follow up visit here on 01/05/17 with Dr.Danis.

## 2016-12-16 NOTE — Telephone Encounter (Signed)
Called patient back had to leave vm, I cannot see in Epic her visit or CXR last night. I have asked that she contact where she had this done and have them fax this to my attention. Dr. Loletha Carrow is not here for the rest of this week and I will need for it to go to DOD. Also instructed her to call office back if she has further questions or concerns, after DOD reviews chart I would get back to her.

## 2016-12-21 NOTE — Telephone Encounter (Signed)
I reviewed the chest Xray report from the urgent care.  I will see her as scheduled.  But it is not clear to me if this pain she describes is due to the hernia.  If she has not done so since the UC visit, I highly recommend a visit with primary care in the meantime to see if they have any other concerns about causes and tests they may wish to do.

## 2016-12-22 NOTE — Telephone Encounter (Signed)
Left detailed message for patient with Dr. Loletha Carrow' recommendations to contact PCP for follow up visit after seeing urgent care. Also let her know that he will see her at her scheduled appointment here.

## 2016-12-31 ENCOUNTER — Other Ambulatory Visit (INDEPENDENT_AMBULATORY_CARE_PROVIDER_SITE_OTHER): Payer: Managed Care, Other (non HMO)

## 2016-12-31 DIAGNOSIS — R195 Other fecal abnormalities: Secondary | ICD-10-CM

## 2016-12-31 LAB — HEMOCCULT SLIDES (X 3 CARDS)
Fecal Occult Blood: NEGATIVE
OCCULT 1: NEGATIVE
OCCULT 2: NEGATIVE
OCCULT 3: NEGATIVE
OCCULT 4: NEGATIVE
OCCULT 5: NEGATIVE

## 2017-01-05 ENCOUNTER — Encounter: Payer: Self-pay | Admitting: Gastroenterology

## 2017-01-05 ENCOUNTER — Ambulatory Visit: Payer: Managed Care, Other (non HMO) | Admitting: Gastroenterology

## 2017-01-05 VITALS — BP 124/80 | HR 78 | Ht 68.0 in | Wt 263.2 lb

## 2017-01-05 DIAGNOSIS — K2270 Barrett's esophagus without dysplasia: Secondary | ICD-10-CM | POA: Diagnosis not present

## 2017-01-05 DIAGNOSIS — D509 Iron deficiency anemia, unspecified: Secondary | ICD-10-CM | POA: Diagnosis not present

## 2017-01-05 DIAGNOSIS — K219 Gastro-esophageal reflux disease without esophagitis: Secondary | ICD-10-CM | POA: Diagnosis not present

## 2017-01-05 DIAGNOSIS — K449 Diaphragmatic hernia without obstruction or gangrene: Secondary | ICD-10-CM

## 2017-01-05 NOTE — Progress Notes (Signed)
[facility] Progress Note  Chief Complaint: Iron deficiency anemia and hiatal hernia  Subjective  History:  Anna Jackson follows up after her endoscopic workup for iron deficiency anemia in September.  She was referred by hematology because she required IV iron for low hemoglobin and ferritin.  EGD and colonoscopy did not find a source of blood loss, but it confirmed a large hiatal hernia that was known from her previous chest x-ray.  She had had a fundoplication at least a decade ago for reflux symptoms.  She is also known to have Barrett's esophagus, last seen by Dr. Deatra Ina in 2012.  Barrett's was again identified on this exam, a 6 cm segment without suspicious areas, no dysplasia on biopsies. Because it was unclear to me if Anna Jackson had anemia on the basis of blood loss, she was agreeable to some stool cards, which have come back negative. A few weeks ago she had a more severe episode of some nonexertional dull chest discomfort then previous episodes.  She had not mentioned this to me in the past, she says she had alerted her primary care provider and I have done a cardiac workup.  With the chest pain she went up at an urgent care, and a chest x-ray revealed with a believed to be a mass and want her to be seen urgently.  Review of that report indicates that is the known hiatal hernia.  She has been back to see primary care since then. She is not having that same degree of chest pain, says it is now just a "twinge" and she is having more frequent heartburn, especially laying down at night.  ROS: Remainder of systems are negative except as above in history The patient's Past Medical, Family and Social History were reviewed and are on file in the EMR.  Objective:  Med list reviewed  Current Outpatient Medications:  .  albuterol (PROAIR HFA) 108 (90 Base) MCG/ACT inhaler, 2 puffs q6h prn, Disp: 1 Inhaler, Rfl: 2 .  beclomethasone (QVAR) 40 MCG/ACT inhaler, Inhale 2 puffs into the lungs 2 (two)  times daily., Disp: 1 Inhaler, Rfl: 12 .  Cholecalciferol (VITAMIN D) 2000 units tablet, Take 2,000 Units by mouth daily., Disp: , Rfl:  .  FLUoxetine (PROZAC) 20 MG tablet, Take 1 tablet (20 mg total) by mouth daily., Disp: 90 tablet, Rfl: 2 .  hydrochlorothiazide (HYDRODIURIL) 25 MG tablet, Take 1 tablet (25 mg total) by mouth daily., Disp: 90 tablet, Rfl: 2 .  omeprazole (PRILOSEC OTC) 20 MG tablet, Take 1 tablet (20 mg total) by mouth daily as needed., Disp: 30 tablet, Rfl: 10 .  potassium chloride SA (K-DUR,KLOR-CON) 20 MEQ tablet, Take 1 tablet (20 mEq total) by mouth daily., Disp: 30 tablet, Rfl: 2 .  valACYclovir (VALTREX) 1000 MG tablet, Take 1 tablet (1,000 mg total) by mouth 3 (three) times daily. (Patient taking differently: Take 1,000 mg by mouth 3 (three) times daily as needed. ), Disp: 90 tablet, Rfl: 1 .  vitamin B-12 (CYANOCOBALAMIN) 1000 MCG tablet, Take 1,000 mcg by mouth daily., Disp: , Rfl:    Vital signs in last 24 hrs: Vitals:   01/05/17 1042  BP: 124/80  Pulse: 78  SpO2: 97%    Physical Exam    HEENT: sclera anicteric, oral mucosa moist without lesions  Neck: supple, no thyromegaly, JVD or lymphadenopathy  Cardiac: RRR without murmurs, S1S2 heard, no peripheral edema  Pulm: clear to auscultation bilaterally, normal RR and effort noted  Abdomen: soft, no  tenderness, with active bowel sounds. No guarding or palpable hepatosplenomegaly.  Skin; warm and dry, no jaundice or rash  Outside CXR with HH seen   @ASSESSMENTPLANBEGIN @ Assessment: Encounter Diagnoses  Name Primary?  . Hiatal hernia Yes  . Gastroesophageal reflux disease without esophagitis   . Barrett's esophagus without dysplasia   . Iron deficiency anemia, unspecified iron deficiency anemia type     She has a large hiatal hernia, with about half the stomach intrathoracic.  I believe this is why she is having the chest pain and reflux symptoms.  She needs a repair of a diaphragmatic hernia  both for symptom control and to decrease reflux, especially in the setting of  long segment Barrett's esophagus.  Her iron deficiency anemia does not appear to be on the basis of GI blood loss.  Duodenal biopsy was negative for celiac sprue.  I suspect she has intermittent poor iron absorption from the small bowel, and might need periodic IV iron therapy.  She says this is being followed by Dr. Alvy Jackson  We have scheduled an esophageal manometry in anticipation of her surgical appointment, and we will send along appropriate records.  Total time 30 minutes, over half spent in counseling and coordination of care.   Anna Jackson III CC: Anna Lark, MD

## 2017-01-05 NOTE — Patient Instructions (Signed)
If you are age 54 or older, your body mass index should be between 23-30. Your Body mass index is 40.02 kg/m. If this is out of the aforementioned range listed, please consider follow up with your Primary Care Provider.  If you are age 94 or younger, your body mass index should be between 19-25. Your Body mass index is 40.02 kg/m. If this is out of the aformentioned range listed, please consider follow up with your Primary Care Provider.   You have been scheduled for an esophageal manometry at [facility] on 01-12-2017 at 1030am. Please arrive 30 minutes prior to your procedure for registration. You will need to go to outpatient registration (1st floor of the hospital) first. Make certain to bring your insurance cards as well as a complete list of medications.  Please remember the following:  1) Do not take any muscle relaxants, xanax (alprazolam) or ativan for 1 day prior to your test as well as the day of the test.  2) Nothing to eat or drink after 12:00 midnight on the night before your test.  3) Hold all diabetic medications/insulin the morning of the test. You may eat and take your medications after the test.  It will take at least 2 weeks to receive the results of this test from your physician. ------------------------------------------ ABOUT ESOPHAGEAL MANOMETRY Esophageal manometry (muh-NOM-uh-tree) is a test that gauges how well your esophagus works. Your esophagus is the long, muscular tube that connects your throat to your stomach. Esophageal manometry measures the rhythmic muscle contractions (peristalsis) that occur in your esophagus when you swallow. Esophageal manometry also measures the coordination and force exerted by the muscles of your esophagus.  During esophageal manometry, a thin, flexible tube (catheter) that contains sensors is passed through your nose, down your esophagus and into your stomach. Esophageal manometry can be helpful in diagnosing some mostly  uncommon disorders that affect your esophagus.  Why it's done Esophageal manometry is used to evaluate the movement (motility) of food through the esophagus and into the stomach. The test measures how well the circular bands of muscle (sphincters) at the top and bottom of your esophagus open and close, as well as the pressure, strength and pattern of the wave of esophageal muscle contractions that moves food along.  What you can expect Esophageal manometry is an outpatient procedure done without sedation. Most people tolerate it well. You may be asked to change into a hospital gown before the test starts.  During esophageal manometry  While you are sitting up, a member of your health care team sprays your throat with a numbing medication or puts numbing gel in your nose or both.  A catheter is guided through your nose into your esophagus. The catheter may be sheathed in a water-filled sleeve. It doesn't interfere with your breathing. However, your eyes may water, and you may gag. You may have a slight nosebleed from irritation.  After the catheter is in place, you may be asked to lie on your back on an exam table, or you may be asked to remain seated.  You then swallow small sips of water. As you do, a computer connected to the catheter records the pressure, strength and pattern of your esophageal muscle contractions.  During the test, you'll be asked to breathe slowly and smoothly, remain as still as possible, and swallow only when you're asked to do so.  A member of your health care team may move the catheter down into your stomach while the catheter  continues its measurements.  The catheter then is slowly withdrawn. The test usually lasts 20 to 30 minutes.  After esophageal manometry  When your esophageal manometry is complete, you may return to your normal activities  This test typically takes 30-45 minutes to  complete. ________________________________________________________________________________  We will send your records to [facility]/Dr Michael Boston, they will call to schedule you an appointment. Please call the office if you do not hear from them in the next 2 weeks.  Thank you for choosing [facility]  Dr Wilfrid Lund III

## 2017-01-12 ENCOUNTER — Encounter (HOSPITAL_COMMUNITY)
Admission: RE | Disposition: A | Discharge status: Home / Self Care | Payer: Self-pay | Source: Ambulatory Visit | Attending: Gastroenterology

## 2017-01-12 ENCOUNTER — Ambulatory Visit (HOSPITAL_COMMUNITY)
Admission: RE | Admit: 2017-01-12 | Discharge: 2017-01-12 | Disposition: A | Discharge status: Home / Self Care | Payer: Managed Care, Other (non HMO) | Source: Ambulatory Visit | Attending: Gastroenterology | Admitting: Gastroenterology

## 2017-01-12 DIAGNOSIS — C8580 Other specified types of non-Hodgkin lymphoma, unspecified site: Secondary | ICD-10-CM | POA: Insufficient documentation

## 2017-01-12 DIAGNOSIS — K449 Diaphragmatic hernia without obstruction or gangrene: Secondary | ICD-10-CM | POA: Insufficient documentation

## 2017-01-12 DIAGNOSIS — Z888 Allergy status to other drugs, medicaments and biological substances status: Secondary | ICD-10-CM | POA: Diagnosis not present

## 2017-01-12 DIAGNOSIS — F419 Anxiety disorder, unspecified: Secondary | ICD-10-CM | POA: Insufficient documentation

## 2017-01-12 DIAGNOSIS — E785 Hyperlipidemia, unspecified: Secondary | ICD-10-CM | POA: Diagnosis not present

## 2017-01-12 DIAGNOSIS — Z79899 Other long term (current) drug therapy: Secondary | ICD-10-CM | POA: Diagnosis not present

## 2017-01-12 DIAGNOSIS — K219 Gastro-esophageal reflux disease without esophagitis: Secondary | ICD-10-CM

## 2017-01-12 DIAGNOSIS — F329 Major depressive disorder, single episode, unspecified: Secondary | ICD-10-CM | POA: Insufficient documentation

## 2017-01-12 DIAGNOSIS — Z885 Allergy status to narcotic agent status: Secondary | ICD-10-CM | POA: Diagnosis not present

## 2017-01-12 DIAGNOSIS — I10 Essential (primary) hypertension: Secondary | ICD-10-CM | POA: Insufficient documentation

## 2017-01-12 DIAGNOSIS — R1310 Dysphagia, unspecified: Secondary | ICD-10-CM | POA: Insufficient documentation

## 2017-01-12 HISTORY — PX: ESOPHAGEAL MANOMETRY: SHX5429

## 2017-01-12 SURGERY — MANOMETRY, ESOPHAGUS

## 2017-01-12 MED ORDER — LIDOCAINE VISCOUS 2 % MT SOLN
OROMUCOSAL | Status: AC
  Filled 2017-01-12: qty 15

## 2017-01-12 SURGICAL SUPPLY — 2 items
FACESHIELD LNG OPTICON STERILE (SAFETY) IMPLANT
GLOVE BIO SURGEON STRL SZ8 (GLOVE) ×6 IMPLANT

## 2017-01-12 NOTE — H&P (Signed)
History:  This patient presents for endoscopic testing for dysphagia - hiatal hernia.  Anna Jackson Referring physician: Carollee Herter, Alferd Apa, DO  Past Medical History: Past Medical History:  Diagnosis Date  . Adenopathy    left axillary  . Anemia   . Anxiety   . Anxiety state, unspecified   . Asthma    no problem with asthma in several years  . Barrett esophagus   . Depression   . Esophageal reflux   . Headache 05/16/2013  . Herpes simplex without mention of complication   . Hyperlipidemia 03/12/2014  . Hypertension   . 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 Mental disorder   . Neuropathic pain 04/04/2014  . Non Hodgkin's lymphoma ([place]) 2015  . Skin infection 11/02/2013  . Unspecified asthma(493.90)   . Unspecified essential hypertension   . URI (upper respiratory infection) 01/15/2014     Past Surgical History: Past Surgical History:  Procedure Laterality Date  . ABDOMINAL HYSTERECTOMY  2006  . AXILLARY LYMPH NODE DISSECTION Left 03/09/2013   Procedure: EXCISION LEFT AXILLARY LYMPH NODES;  Surgeon: [name], MD;  Location: [facility];  Service: General;  Laterality: Left;  . BREAST BIOPSY Left 02/13/2013   high risk  . BREAST EXCISIONAL BIOPSY Left 03/09/2013   high risk  . CERVICAL CONIZATION W/BX    . CHOLECYSTECTOMY     with hernia repair  . KNEE SURGERY  02/2010   Right--arthroscopic  . NISSEN FUNDOPLICATION    . PORTACATH PLACEMENT N/A 03/23/2013   Procedure: INSERTION PORT-A-CATH;  Surgeon: [name], MD;  Location: [facility];  Service: General;  Laterality: N/A;  . TUBAL LIGATION      Allergies: Allergies  Allergen Reactions  . Betadine [Povidone Iodine] Hives and Itching  . Codeine     REACTION: SICK-NAUSEATED    Outpatient Meds: No current facility-administered medications for this encounter.    Current Outpatient Medications  Medication Sig Dispense Refill  . albuterol (PROAIR HFA) 108 (90 Base) MCG/ACT inhaler 2 puffs q6h prn 1 Inhaler  2  . beclomethasone (QVAR) 40 MCG/ACT inhaler Inhale 2 puffs into the lungs 2 (two) times daily. 1 Inhaler 12  . Cholecalciferol (VITAMIN D) 2000 units tablet Take 2,000 Units by mouth daily.    Marland Kitchen FLUoxetine (PROZAC) 20 MG tablet Take 1 tablet (20 mg total) by mouth daily. 90 tablet 2  . hydrochlorothiazide (HYDRODIURIL) 25 MG tablet Take 1 tablet (25 mg total) by mouth daily. 90 tablet 2  . omeprazole (PRILOSEC OTC) 20 MG tablet Take 1 tablet (20 mg total) by mouth daily as needed. 30 tablet 10  . potassium chloride SA (K-DUR,KLOR-CON) 20 MEQ tablet Take 1 tablet (20 mEq total) by mouth daily. 30 tablet 2  . valACYclovir (VALTREX) 1000 MG tablet Take 1 tablet (1,000 mg total) by mouth 3 (three) times daily. (Patient taking differently: Take 1,000 mg by mouth 3 (three) times daily as needed. ) 90 tablet 1  . vitamin B-12 (CYANOCOBALAMIN) 1000 MCG tablet Take 1,000 mcg by mouth daily.        ___________________________________________________________________ Objective   Exam:  Ht 5\' 8"  (1.727 m)   BMI 40.02 kg/m   There is no physician contact with the patient for this procedure.  It is performed by the endoscopy nurse.   Assessment:  Dysphagia, hiatal hernia  Plan:  Esophageal manometry   Nelida Meuse III

## 2017-01-12 NOTE — Progress Notes (Signed)
Esophageal Manometry done per protocol.  Patient tolerated procedure well, w/o complications.  Dr. Silverio Decamp to be notified today of study.   Laverta Baltimore, RN

## 2017-01-13 ENCOUNTER — Encounter (HOSPITAL_COMMUNITY): Payer: Self-pay | Admitting: Gastroenterology

## 2017-01-14 DIAGNOSIS — K449 Diaphragmatic hernia without obstruction or gangrene: Secondary | ICD-10-CM

## 2017-01-19 ENCOUNTER — Telehealth: Payer: Self-pay

## 2017-01-19 NOTE — Telephone Encounter (Signed)
Faxed esophageal motility test information to [facility] on 01/18/17.

## 2017-01-25 ENCOUNTER — Ambulatory Visit: Payer: Self-pay | Admitting: Surgery

## 2017-01-25 ENCOUNTER — Encounter: Payer: Self-pay | Admitting: Surgery

## 2017-01-27 ENCOUNTER — Other Ambulatory Visit (HOSPITAL_COMMUNITY): Payer: Self-pay | Admitting: Surgery

## 2017-01-27 DIAGNOSIS — K3184 Gastroparesis: Secondary | ICD-10-CM

## 2017-01-27 DIAGNOSIS — K449 Diaphragmatic hernia without obstruction or gangrene: Secondary | ICD-10-CM

## 2017-01-27 DIAGNOSIS — R1310 Dysphagia, unspecified: Secondary | ICD-10-CM

## 2017-01-27 DIAGNOSIS — K2100 Gastro-esophageal reflux disease with esophagitis, without bleeding: Secondary | ICD-10-CM

## 2017-01-28 ENCOUNTER — Encounter: Payer: Self-pay | Admitting: Family Medicine

## 2017-01-28 ENCOUNTER — Ambulatory Visit: Payer: Managed Care, Other (non HMO) | Admitting: Family Medicine

## 2017-01-28 ENCOUNTER — Other Ambulatory Visit: Payer: Self-pay

## 2017-01-28 VITALS — BP 130/90 | HR 97 | Temp 98.6°F | Ht 69.0 in | Wt 264.0 lb

## 2017-01-28 DIAGNOSIS — B029 Zoster without complications: Secondary | ICD-10-CM

## 2017-01-28 DIAGNOSIS — F329 Major depressive disorder, single episode, unspecified: Secondary | ICD-10-CM | POA: Diagnosis not present

## 2017-01-28 DIAGNOSIS — J209 Acute bronchitis, unspecified: Secondary | ICD-10-CM

## 2017-01-28 DIAGNOSIS — F32A Depression, unspecified: Secondary | ICD-10-CM

## 2017-01-28 DIAGNOSIS — R609 Edema, unspecified: Secondary | ICD-10-CM

## 2017-01-28 DIAGNOSIS — J40 Bronchitis, not specified as acute or chronic: Secondary | ICD-10-CM | POA: Diagnosis not present

## 2017-01-28 MED ORDER — ALBUTEROL SULFATE HFA 108 (90 BASE) MCG/ACT IN AERS: INHALATION_SPRAY | RESPIRATORY_TRACT | 2 refills | Status: DC

## 2017-01-28 MED ORDER — HYDROCHLOROTHIAZIDE 25 MG PO TABS: 25.0000 mg | ORAL_TABLET | Freq: Every day | ORAL | 2 refills | Status: DC

## 2017-01-28 MED ORDER — POTASSIUM CHLORIDE CRYS ER 20 MEQ PO TBCR: 20.0000 meq | EXTENDED_RELEASE_TABLET | Freq: Every day | ORAL | 2 refills | Status: DC

## 2017-01-28 MED ORDER — FLUOXETINE HCL 20 MG PO TABS: 20.0000 mg | ORAL_TABLET | Freq: Every day | ORAL | 2 refills | Status: DC

## 2017-01-28 MED ORDER — VALACYCLOVIR HCL 1 G PO TABS: 1000.0000 mg | ORAL_TABLET | Freq: Three times a day (TID) | ORAL | 1 refills | Status: DC

## 2017-01-28 MED ORDER — BECLOMETHASONE DIPROPIONATE 40 MCG/ACT IN AERS: 2.0000 | INHALATION_SPRAY | Freq: Two times a day (BID) | RESPIRATORY_TRACT | 12 refills | Status: DC

## 2017-01-28 NOTE — Progress Notes (Signed)
Subjective:  I acted as a Education administrator for [employer], [place]   Patient ID: Anna Jackson, female    DOB: 1960-12-07, 56 y.o.   MRN: [id number]  Chief Complaint  Patient presents with  . Medication Refill    HPI  Patient is in today for medication refills    Pt moved to [place] and switched pharmacies  No complications Patient Care Team: Carollee Herter, Alferd Apa, DO as PCP - General Fanny Skates, MD as Consulting Physician (General Surgery) Heath Lark, MD as Consulting Physician (Hematology and Oncology) Michael Boston, MD as Consulting Physician (General Surgery) Danis, Kirke Corin, MD as Consulting Physician (Gastroenterology) Ena Dawley, MD as Consulting Physician (Obstetrics and Gynecology) Garald Balding, MD as Consulting Physician (Orthopedic Surgery)   Past Medical History:  Diagnosis Date  . Adenopathy    left axillary  . Anemia   . Anxiety   . Anxiety state, unspecified   . Asthma    no problem with asthma in several years  . Barrett esophagus   . Depression   . Esophageal reflux   . Headache 05/16/2013  . Herpes simplex without mention of complication   . Hyperlipidemia 03/12/2014  . Hypertension   . 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 Mental disorder   . Neuropathic pain 04/04/2014  . Non Hodgkin's lymphoma ([place]) 2015  . Skin infection 11/02/2013  . Unspecified asthma(493.90)   . Unspecified essential hypertension   . URI (upper respiratory infection) 01/15/2014    Past Surgical History:  Procedure Laterality Date  . AXILLARY LYMPH NODE DISSECTION Left 03/09/2013   Procedure: EXCISION LEFT AXILLARY LYMPH NODES;  Surgeon: [name], MD;  [facility]: [facility];  Service: General;  Laterality: Left;  . BREAST BIOPSY Left 02/13/2013   high risk  . BREAST EXCISIONAL BIOPSY Left 03/09/2013   high risk  . CERVICAL CONIZATION W/BX    . CHOLECYSTECTOMY  1995   Open, done with Hiatal hernia repair  . ESOPHAGEAL MANOMETRY N/A 01/12/2017   Procedure: ESOPHAGEAL MANOMETRY (EM);  Surgeon: Doran Stabler, MD;  Location: [facility];  Service: Gastroenterology;  Laterality: N/A;  . INCISIONAL HERNIA REPAIR  2004   Dr Nedra Hai.  Parietex 20x15cm mesh  . KNEE SURGERY Right 02/2010   Dr Shellia Carwin, Arthroscopic  . NISSEN FUNDOPLICATION  9024   OPEN, Dr Lennie Hummer  . PORTACATH PLACEMENT N/A 03/23/2013   Procedure: INSERTION PORT-A-CATH;  Surgeon: [name], MD;  Location: [facility];  Service: General;  Laterality: N/A;  . TUBAL LIGATION    . VAGINAL HYSTERECTOMY  2002   Dr Art Raphael Gibney    Family History  Problem Relation Age of Onset  . Heart disease Mother   . Coronary artery disease Unknown        with hernia repair  . Migraines Unknown   . Hypertension Unknown   . Breast cancer Maternal Grandmother 50  . Cancer Maternal Grandmother 50       breast  . Heart disease Brother   . Colon cancer Neg Hx     Social History   Socioeconomic History  . Marital status: Single    Spouse name: Not on file  . Number of children: 1  . Years of education: Not on file  . Highest education level: Not on file  Social Needs  . Financial resource strain: Not on file  . Food insecurity - worry: Not on file  . Food insecurity - inability: Not on  file  . Transportation needs - medical: Not on file  . Transportation needs - non-medical: Not on file  Occupational History  . Occupation: RECEIVING Armed forces operational officer: [employer]  Tobacco Use  . Smoking status: Never Smoker  . Smokeless tobacco: Never Used  Substance and Sexual Activity  . Alcohol use: Yes    Comment: occasional  . Drug use: No  . Sexual activity: Not Currently    Partners: Male  Other Topics Concern  . Not on file  Social History Narrative   Exercise--  treadmill    Outpatient Medications Prior to Visit  Medication Sig Dispense Refill  . Cholecalciferol (VITAMIN D) 2000 units tablet Take 2,000 Units by mouth daily.    Marland Kitchen omeprazole (PRILOSEC OTC) 20  MG tablet Take 1 tablet (20 mg total) by mouth daily as needed. 30 tablet 10  . vitamin B-12 (CYANOCOBALAMIN) 1000 MCG tablet Take 1,000 mcg by mouth daily.    Marland Kitchen albuterol (PROAIR HFA) 108 (90 Base) MCG/ACT inhaler 2 puffs q6h prn 1 Inhaler 2  . beclomethasone (QVAR) 40 MCG/ACT inhaler Inhale 2 puffs into the lungs 2 (two) times daily. 1 Inhaler 12  . FLUoxetine (PROZAC) 20 MG tablet Take 1 tablet (20 mg total) by mouth daily. 90 tablet 2  . hydrochlorothiazide (HYDRODIURIL) 25 MG tablet Take 1 tablet (25 mg total) by mouth daily. 90 tablet 2  . potassium chloride SA (K-DUR,KLOR-CON) 20 MEQ tablet Take 1 tablet (20 mEq total) by mouth daily. 30 tablet 2  . valACYclovir (VALTREX) 1000 MG tablet Take 1 tablet (1,000 mg total) by mouth 3 (three) times daily. (Patient taking differently: Take 1,000 mg by mouth 3 (three) times daily as needed. ) 90 tablet 1   No facility-administered medications prior to visit.     Allergies  Allergen Reactions  . Betadine [Povidone Iodine] Hives and Itching  . Codeine     REACTION: SICK-NAUSEATED    Review of Systems  Constitutional: Negative for chills, fever and malaise/fatigue.  HENT: Negative for congestion and hearing loss.   Eyes: Negative for discharge.  Respiratory: Negative for cough, sputum production and shortness of breath.   Cardiovascular: Negative for chest pain, palpitations and leg swelling.  Gastrointestinal: Negative for abdominal pain, blood in stool, constipation, diarrhea, heartburn, nausea and vomiting.  Genitourinary: Negative for dysuria, frequency, hematuria and urgency.  Musculoskeletal: Negative for back pain, falls and myalgias.  Skin: Negative for rash.  Neurological: Negative for dizziness, sensory change, loss of consciousness, weakness and headaches.  Endo/Heme/Allergies: Negative for environmental allergies. Does not bruise/bleed easily.  Psychiatric/Behavioral: Negative for depression and suicidal ideas. The patient is  not nervous/anxious and does not have insomnia.        Objective:    Physical Exam  Constitutional: She is oriented to person, place, and time. She appears well-developed and well-nourished.  HENT:  Head: Normocephalic and atraumatic.  Eyes: Conjunctivae and EOM are normal.  Neck: Normal range of motion. Neck supple. No JVD present. Carotid bruit is not present. No thyromegaly present.  Cardiovascular: Normal rate, regular rhythm and normal heart sounds.  No murmur heard. Pulmonary/Chest: Effort normal and breath sounds normal. No respiratory distress. She has no wheezes. She has no rales. She exhibits no tenderness.  Musculoskeletal: She exhibits no edema.  Neurological: She is alert and oriented to person, place, and time.  Psychiatric: She has a normal mood and affect. Her behavior is normal. Judgment and thought content normal.  Nursing note and vitals reviewed.  BP 130/90   Pulse 97   Temp 98.6 F (37 C) (Oral)   Ht '5\' 9"'  (1.753 m)   Wt 264 lb (119.7 kg)   SpO2 96%   BMI 38.99 kg/m  Wt Readings from Last 3 Encounters:  01/28/17 264 lb (119.7 kg)  01/05/17 263 lb 3.2 oz (119.4 kg)  11/11/16 258 lb (117 kg)   BP Readings from Last 3 Encounters:  01/28/17 130/90  01/05/17 124/80  11/11/16 134/80     Immunization History  Administered Date(s) Administered  . Influenza Split 11/23/2010  . Influenza Whole 11/21/2007, 12/22/2009, 11/16/2011, 11/09/2012  . Influenza, Seasonal, Injecte, Preservative Fre 12/11/2014  . Influenza,inj,Quad PF,6+ Mos 12/10/2013  . Influenza-Unspecified 12/10/2015, 12/09/2016  . Pneumococcal Conjugate-13 12/10/2013  . Pneumococcal Polysaccharide-23 02/07/2014  . Td 05/09/2001  . Tdap 11/23/2010    Health Maintenance  Topic Date Due  . Hepatitis C Screening  November 12, 1960  . HIV Screening  11/14/1977  . PAP SMEAR  03/28/2015  . INFLUENZA VACCINE  09/15/2016  . MAMMOGRAM  01/18/2017  . TETANUS/TDAP  11/22/2020  . COLONOSCOPY   11/12/2026    Lab Results  Component Value Date   WBC 6.6 11/18/2016   HGB 12.4 11/18/2016   HCT 38.6 11/18/2016   PLT 315 11/18/2016   GLUCOSE 90 11/18/2016   CHOL 178 02/17/2016   TRIG 143.0 02/17/2016   HDL 63.80 02/17/2016   LDLDIRECT 105.0 11/23/2010   LDLCALC 85 02/17/2016   ALT 19 11/18/2016   AST 14 11/18/2016   NA 141 11/18/2016   K 4.0 11/18/2016   CL 102 02/17/2016   CREATININE 0.8 11/18/2016   BUN 14.1 11/18/2016   CO2 25 11/18/2016   TSH 1.05 01/13/2016   INR 0.99 03/29/2013    Lab Results  Component Value Date   TSH 1.05 01/13/2016   Lab Results  Component Value Date   WBC 6.6 11/18/2016   HGB 12.4 11/18/2016   HCT 38.6 11/18/2016   MCV 88.5 11/18/2016   PLT 315 11/18/2016   Lab Results  Component Value Date   NA 141 11/18/2016   K 4.0 11/18/2016   CHLORIDE 106 11/18/2016   CO2 25 11/18/2016   GLUCOSE 90 11/18/2016   BUN 14.1 11/18/2016   CREATININE 0.8 11/18/2016   BILITOT 0.46 11/18/2016   ALKPHOS 143 11/18/2016   AST 14 11/18/2016   ALT 19 11/18/2016   PROT 6.7 11/18/2016   ALBUMIN 3.5 11/18/2016   CALCIUM 9.5 11/18/2016   ANIONGAP 9 11/18/2016   EGFR 89 (L) 11/18/2016   GFR 85.83 02/17/2016   Lab Results  Component Value Date   CHOL 178 02/17/2016   Lab Results  Component Value Date   HDL 63.80 02/17/2016   Lab Results  Component Value Date   LDLCALC 85 02/17/2016   Lab Results  Component Value Date   TRIG 143.0 02/17/2016   Lab Results  Component Value Date   CHOLHDL 3 02/17/2016   No results found for: HGBA1C       Assessment & Plan:   Problem List Items Addressed This Visit      Unprioritized   Acute bronchitis   Relevant Medications   beclomethasone (QVAR) 40 MCG/ACT inhaler   Depression   Relevant Medications   FLUoxetine (PROZAC) 20 MG tablet    Other Visit Diagnoses    Edema, unspecified type    -  Primary   Relevant Medications   potassium chloride SA (K-DUR,KLOR-CON) 20 MEQ tablet    hydrochlorothiazide (  HYDRODIURIL) 25 MG tablet   Shingles       Relevant Medications   valACYclovir (VALTREX) 1000 MG tablet   Bronchitis       Relevant Medications   albuterol (PROAIR HFA) 108 (90 Base) MCG/ACT inhaler      I am having Anna Jackson maintain her omeprazole, vitamin B-12, Vitamin D, valACYclovir, potassium chloride SA, hydrochlorothiazide, FLUoxetine, beclomethasone, and albuterol.  Meds ordered this encounter  Medications  . valACYclovir (VALTREX) 1000 MG tablet    Sig: Take 1 tablet (1,000 mg total) by mouth 3 (three) times daily.    Dispense:  90 tablet    Refill:  1  . potassium chloride SA (K-DUR,KLOR-CON) 20 MEQ tablet    Sig: Take 1 tablet (20 mEq total) by mouth daily.    Dispense:  30 tablet    Refill:  2  . hydrochlorothiazide (HYDRODIURIL) 25 MG tablet    Sig: Take 1 tablet (25 mg total) by mouth daily.    Dispense:  90 tablet    Refill:  2  . FLUoxetine (PROZAC) 20 MG tablet    Sig: Take 1 tablet (20 mg total) by mouth daily.    Dispense:  90 tablet    Refill:  2  . beclomethasone (QVAR) 40 MCG/ACT inhaler    Sig: Inhale 2 puffs into the lungs 2 (two) times daily.    Dispense:  1 Inhaler    Refill:  12  . albuterol (PROAIR HFA) 108 (90 Base) MCG/ACT inhaler    Sig: 2 puffs q6h prn    Dispense:  1 Inhaler    Refill:  2    CMA served as scribe during this visit. History, Physical and Plan performed by medical provider. Documentation and orders reviewed and attested to.  Ann Held, DO

## 2017-01-28 NOTE — Patient Instructions (Signed)
Edema Edema is an abnormal buildup of fluids in your bodytissues. Edema is somewhatdependent on gravity to pull the fluid to the lowest place in your body. That makes the condition more common in the legs and thighs (lower extremities). Painless swelling of the feet and ankles is common and becomes more likely as you get older. It is also common in looser tissues, like around your eyes. When the affected area is squeezed, the fluid may move out of that spot and leave a dent for a few moments. This dent is called pitting. What are the causes? There are many possible causes of edema. Eating too much salt and being on your feet or sitting for a long time can cause edema in your legs and ankles. Hot weather may make edema worse. Common medical causes of edema include:  Heart failure.  Liver disease.  Kidney disease.  Weak blood vessels in your legs.  Cancer.  An injury.  Pregnancy.  Some medications.  Obesity.  What are the signs or symptoms? Edema is usually painless.Your skin may look swollen or shiny. How is this diagnosed? Your health care provider may be able to diagnose edema by asking about your medical history and doing a physical exam. You may need to have tests such as X-rays, an electrocardiogram, or blood tests to check for medical conditions that may cause edema. How is this treated? Edema treatment depends on the cause. If you have heart, liver, or kidney disease, you need the treatment appropriate for these conditions. General treatment may include:  Elevation of the affected body part above the level of your heart.  Compression of the affected body part. Pressure from elastic bandages or support stockings squeezes the tissues and forces fluid back into the blood vessels. This keeps fluid from entering the tissues.  Restriction of fluid and salt intake.  Use of a water pill (diuretic). These medications are appropriate only for some types of edema. They pull fluid  out of your body and make you urinate more often. This gets rid of fluid and reduces swelling, but diuretics can have side effects. Only use diuretics as directed by your health care provider.  Follow these instructions at home:  Keep the affected body part above the level of your heart when you are lying down.  Do not sit still or stand for prolonged periods.  Do not put anything directly under your knees when lying down.  Do not wear constricting clothing or garters on your upper legs.  Exercise your legs to work the fluid back into your blood vessels. This may help the swelling go down.  Wear elastic bandages or support stockings to reduce ankle swelling as directed by your health care provider.  Eat a low-salt diet to reduce fluid if your health care provider recommends it.  Only take medicines as directed by your health care provider. Contact a health care provider if:  Your edema is not responding to treatment.  You have heart, liver, or kidney disease and notice symptoms of edema.  You have edema in your legs that does not improve after elevating them.  You have sudden and unexplained weight gain. Get help right away if:  You develop shortness of breath or chest pain.  You cannot breathe when you lie down.  You develop pain, redness, or warmth in the swollen areas.  You have heart, liver, or kidney disease and suddenly get edema.  You have a fever and your symptoms suddenly get worse. This information is   not intended to replace advice given to you by your health care provider. Make sure you discuss any questions you have with your health care provider. Document Released: 02/01/2005 Document Revised: 07/10/2015 Document Reviewed: 11/24/2012 Elsevier Interactive Patient Education  2017 [employer]  

## 2017-02-02 ENCOUNTER — Telehealth: Payer: Self-pay | Admitting: *Deleted

## 2017-02-02 NOTE — Telephone Encounter (Signed)
Faxed refill request for Alternative Rx  D/t Discontinued products received from [facility] pharmacy for QVAR 4mcg oral Inhaler; suggested alternative: QVAR Redihaler 40 mcg. Forwarded request to provider/[facility] 12/19

## 2017-02-03 ENCOUNTER — Other Ambulatory Visit: Payer: Self-pay | Admitting: *Deleted

## 2017-02-03 MED ORDER — BECLOMETHASONE DIPROP HFA 40 MCG/ACT IN AERB: 2.0000 | INHALATION_SPRAY | Freq: Two times a day (BID) | RESPIRATORY_TRACT | 12 refills | Status: DC

## 2017-02-03 NOTE — Telephone Encounter (Signed)
Pharmacy called to change.  Patient notified

## 2017-02-04 ENCOUNTER — Ambulatory Visit (HOSPITAL_COMMUNITY): Payer: Managed Care, Other (non HMO)

## 2017-02-16 ENCOUNTER — Encounter: Payer: Self-pay | Admitting: Medical

## 2017-02-16 ENCOUNTER — Ambulatory Visit: Payer: Managed Care, Other (non HMO) | Admitting: Medical

## 2017-02-16 VITALS — BP 127/77 | HR 85 | Temp 98.1°F | Resp 16 | Ht 69.0 in | Wt 261.8 lb

## 2017-02-16 DIAGNOSIS — R062 Wheezing: Secondary | ICD-10-CM

## 2017-02-16 DIAGNOSIS — J40 Bronchitis, not specified as acute or chronic: Secondary | ICD-10-CM | POA: Diagnosis not present

## 2017-02-16 DIAGNOSIS — R05 Cough: Secondary | ICD-10-CM | POA: Diagnosis not present

## 2017-02-16 DIAGNOSIS — R059 Cough, unspecified: Secondary | ICD-10-CM

## 2017-02-16 MED ORDER — BENZONATATE 100 MG PO CAPS: 100.0000 mg | ORAL_CAPSULE | Freq: Three times a day (TID) | ORAL | 0 refills | Status: DC | PRN

## 2017-02-16 MED ORDER — BECLOMETHASONE DIPROP HFA 40 MCG/ACT IN AERB: 2.0000 | INHALATION_SPRAY | Freq: Two times a day (BID) | RESPIRATORY_TRACT | 12 refills | Status: DC

## 2017-02-16 MED ORDER — FLUTICASONE PROPIONATE 50 MCG/ACT NA SUSP: 2.0000 | Freq: Every day | NASAL | 1 refills | Status: DC

## 2017-02-16 MED ORDER — DOXYCYCLINE HYCLATE 100 MG PO TABS: 100.0000 mg | ORAL_TABLET | Freq: Two times a day (BID) | ORAL | 0 refills | Status: DC

## 2017-02-16 NOTE — Patient Instructions (Signed)
You appear to have bronchitis. Rest hydrate and tylenol for fever. I am prescribing cough medicine benzonatate, and doxycycline antibiotic. For your nasal congestion you can use flonase.  Would recommend holding off/not taking azithromycin due to potential allergy.  If having to use albuterol frequently then can add on qvar.  You should gradually get better. If not then notify us and would recommend a chest xray.  Follow up in 7-10 days or as needed

## 2017-02-16 NOTE — Progress Notes (Signed)
Subjective:    Patient ID: Anna Jackson, female    DOB: [birth year], 57 y.o.   MRN: [id number]  HPI  Pt in for follow up from Urgent care. She was given steroid injection and azithromycin(prescribed chest congestion with cough for 3-4 days prior to urgent care evaluation.). Treated and evaluated this Sunday. Monday and Tuesday rash on both elbows with itching. Pt had zpack before in past.  Stop Z-Pak yesterday.  Pt was wheezing some before she got steroid injection. Pt also was given an inhaler at urgent care.  Pt has been coughing a lot. Feels mucous in her chest but states can't bring mucous up.  She states steroid injection seemed to cause cheeks/face red and warm for one day.     Review of Systems  Constitutional: Negative for chills, fatigue and fever.  HENT: Negative for congestion, ear pain, facial swelling, nosebleeds, postnasal drip, sinus pressure and sinus pain.   Respiratory: Positive for cough and wheezing. Negative for chest tightness and shortness of breath.        Chest congestion  Cardiovascular: Negative for chest pain and palpitations.  Gastrointestinal: Negative for abdominal pain.  Musculoskeletal: Negative for back pain and gait problem.  Neurological: Negative for dizziness, seizures, speech difficulty, weakness, light-headedness and headaches.  Hematological: Negative for adenopathy. Does not bruise/bleed easily.  Psychiatric/Behavioral: Negative for behavioral problems, confusion and dysphoric mood. The patient is not nervous/anxious.    Past Medical History:  Diagnosis Date  . Adenopathy    left axillary  . Anemia   . Anxiety   . Anxiety state, unspecified   . Asthma    no problem with asthma in several years  . Barrett esophagus   . Depression   . Esophageal reflux   . Headache 05/16/2013  . Herpes simplex without mention of complication   . Hyperlipidemia 03/12/2014  . Hypertension   . 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 Mental disorder     . Neuropathic pain 04/04/2014  . Non Hodgkin's lymphoma ([place]) 2015  . Skin infection 11/02/2013  . Unspecified asthma(493.90)   . Unspecified essential hypertension   . URI (upper respiratory infection) 01/15/2014     Social History   Socioeconomic History  . Marital status: Single    Spouse name: Not on file  . Number of children: 1  . Years of education: Not on file  . Highest education level: Not on file  Social Needs  . Financial resource strain: Not on file  . Food insecurity - worry: Not on file  . Food insecurity - inability: Not on file  . Transportation needs - medical: Not on file  . Transportation needs - non-medical: Not on file  Occupational History  . Occupation: RECEIVING Armed forces operational officer: [employer]  Tobacco Use  . Smoking status: Never Smoker  . Smokeless tobacco: Never Used  Substance and Sexual Activity  . Alcohol use: Yes    Comment: occasional  . Drug use: No  . Sexual activity: Not Currently    Partners: Male  Other Topics Concern  . Not on file  Social History Narrative   Exercise--  treadmill    Past Surgical History:  Procedure Laterality Date  . AXILLARY LYMPH NODE DISSECTION Left 03/09/2013   Procedure: EXCISION LEFT AXILLARY LYMPH NODES;  Surgeon: [name], MD;  Location: [facility];  Service: General;  Laterality: Left;  . BREAST BIOPSY Left 02/13/2013   high risk  . BREAST EXCISIONAL BIOPSY Left 03/09/2013  high risk  . CERVICAL CONIZATION W/BX    . CHOLECYSTECTOMY  1995   Open, done with Hiatal hernia repair  . ESOPHAGEAL MANOMETRY N/A 01/12/2017   Procedure: ESOPHAGEAL MANOMETRY (EM);  Surgeon: Doran Stabler, MD;  Location: [facility];  Service: Gastroenterology;  Laterality: N/A;  . INCISIONAL HERNIA REPAIR  2004   Dr Nedra Hai.  Parietex 20x15cm mesh  . KNEE SURGERY Right 02/2010   Dr Shellia Carwin, Arthroscopic  . NISSEN FUNDOPLICATION  [id number]   OPEN, Dr Lennie Hummer  . PORTACATH PLACEMENT N/A 03/23/2013   Procedure:  INSERTION PORT-A-CATH;  Surgeon: [name], MD;  Location: [place];  Service: General;  Laterality: N/A;  . TUBAL LIGATION    . VAGINAL HYSTERECTOMY  2002   Dr Art Raphael Gibney    Family History  Problem Relation Age of Onset  . Heart disease Mother   . Coronary artery disease Unknown        with hernia repair  . Migraines Unknown   . Hypertension Unknown   . Breast cancer Maternal Grandmother 65  . Cancer Maternal Grandmother 50       breast  . Heart disease Brother   . Colon cancer Neg Hx     Allergies  Allergen Reactions  . Betadine [Povidone Iodine] Hives and Itching  . Codeine     REACTION: SICK-NAUSEATED    Current Outpatient Medications on File Prior to Visit  Medication Sig Dispense Refill  . albuterol (PROAIR HFA) 108 (90 Base) MCG/ACT inhaler 2 puffs q6h prn 1 Inhaler 2  . beclomethasone (QVAR REDIHALER) 40 MCG/ACT inhaler Inhale 2 puffs into the lungs 2 (two) times daily. 10.6 g 12  . Cholecalciferol (VITAMIN D) 2000 units tablet Take 2,000 Units by mouth daily.    Marland Kitchen FLUoxetine (PROZAC) 20 MG tablet Take 1 tablet (20 mg total) by mouth daily. 90 tablet 2  . hydrochlorothiazide (HYDRODIURIL) 25 MG tablet Take 1 tablet (25 mg total) by mouth daily. 90 tablet 2  . omeprazole (PRILOSEC OTC) 20 MG tablet Take 1 tablet (20 mg total) by mouth daily as needed. 30 tablet 10  . potassium chloride SA (K-DUR,KLOR-CON) 20 MEQ tablet Take 1 tablet (20 mEq total) by mouth daily. 30 tablet 2  . valACYclovir (VALTREX) 1000 MG tablet Take 1 tablet (1,000 mg total) by mouth 3 (three) times daily. 90 tablet 1  . vitamin B-12 (CYANOCOBALAMIN) 1000 MCG tablet Take 1,000 mcg by mouth daily.     No current facility-administered medications on file prior to visit.     BP 127/77   Pulse 85   Temp 98.1 F (36.7 C) (Oral)   Resp 16   Ht 5\' 9"  (1.753 m)   Wt 261 lb 12.8 oz (118.8 kg)   SpO2 99%   BMI 38.66 kg/m       Objective:   Physical Exam  General  Mental Status -  Alert. General Appearance - Well groomed. Not in acute distress.  Skin Rashes- No Rashes.  HEENT Head- Normal. Ear Auditory Canal - Left- Normal. Right - Normal.Tympanic Membrane- Left- Normal. Right- Normal. Eye Sclera/Conjunctiva- Left- Normal. Right- Normal. Nose & Sinuses Nasal Mucosa- Left-  Boggy and Congested. Right-  Boggy and  Congested.Bilateral  No maxillary and no rontal sinus pressure. Mouth & Throat Lips: Upper Lip- Normal: no dryness, cracking, pallor, cyanosis, or vesicular eruption. Lower Lip-Normal: no dryness, cracking, pallor, cyanosis or vesicular eruption. Buccal Mucosa- Bilateral- No Aphthous ulcers. Oropharynx- No Discharge or Erythema. Tonsils:  Characteristics- Bilateral- No Erythema or Congestion. Size/Enlargement- Bilateral- No enlargement. Discharge- bilateral-None.  Neck Neck- Supple. No Masses.   Chest and Lung Exam Auscultation: Breath Sounds:-Clear even and unlabored.  Cardiovascular Auscultation:Rythm- Regular, rate and rhythm. Murmurs & Other Heart Sounds:Ausculatation of the heart reveal- No Murmurs.  Lymphatic Head & Neck General Head & Neck Lymphatics: Bilateral: Description- No Localized lymphadenopathy.       Assessment & Plan:  You appear to have bronchitis. Rest hydrate and tylenol for fever. I am prescribing cough medicine benzonatate, and doxycycline antibiotic. For your nasal congestion you can use flonase.  Would recommend holding off/not taking azithromycin due to potential allergy.  If having to use albuterol frequently then can add on qvar.  You should gradually get better. If not then notify us and would recommend a chest xray.  Follow up in 7-10 days or as needed  Axcel Horsch, Percell Miller, [employer]

## 2017-02-18 ENCOUNTER — Encounter (HOSPITAL_COMMUNITY): Payer: Self-pay | Admitting: *Deleted

## 2017-02-22 ENCOUNTER — Other Ambulatory Visit: Payer: Managed Care, Other (non HMO)

## 2017-02-22 ENCOUNTER — Ambulatory Visit: Payer: Managed Care, Other (non HMO) | Admitting: Hematology and Oncology

## 2017-03-02 ENCOUNTER — Ambulatory Visit (HOSPITAL_COMMUNITY)
Admission: RE | Admit: 2017-03-02 | Discharge: 2017-03-02 | Disposition: A | Discharge status: Home / Self Care | Payer: Managed Care, Other (non HMO) | Source: Ambulatory Visit | Attending: Surgery | Admitting: Surgery

## 2017-03-02 DIAGNOSIS — K449 Diaphragmatic hernia without obstruction or gangrene: Secondary | ICD-10-CM | POA: Diagnosis not present

## 2017-03-02 DIAGNOSIS — K210 Gastro-esophageal reflux disease with esophagitis: Secondary | ICD-10-CM | POA: Insufficient documentation

## 2017-03-02 DIAGNOSIS — K3184 Gastroparesis: Secondary | ICD-10-CM

## 2017-03-02 DIAGNOSIS — R1310 Dysphagia, unspecified: Secondary | ICD-10-CM | POA: Insufficient documentation

## 2017-03-02 MED ORDER — TECHNETIUM TC 99M SULFUR COLLOID FILTERED: 2.1000 | Freq: Once | INTRAVENOUS | Status: DC | PRN

## 2017-03-02 MED ORDER — TECHNETIUM TC 99M SULFUR COLLOID
2.1000 | Freq: Once | INTRAVENOUS | Status: AC | PRN
  Administered 2017-03-02: 2.1 via ORAL

## 2017-03-07 NOTE — Progress Notes (Signed)
EKG 06-10-16 EPIC

## 2017-03-07 NOTE — Patient Instructions (Addendum)
Anna Jackson  03/07/2017   Your procedure is scheduled on: 03-16-17  Report to [facility] Main  Entrance    Report to admitting at 6:30AM   Call this number if you have problems the morning of surgery [phone]     Remember: NO SOLID FOOD AFTER MIDNIGHT THE NIGHT PRIOR TO SURGERY. NOTHING BY MOUTH EXCEPT CLEAR LIQUIDS UNTIL 3 HOURS PRIOR TO SCHEDULED SURGERY. PLEASE FINISH ENSURE DRINK PER SURGEON ORDER 3 HOURS PRIOR TO SCHEDULED SURGERY TIME WHICH NEEDS TO BE COMPLETED AT ___5:30AM___.      Take these medicines the morning of surgery with A SIP OF WATER: FLUOXETINE(PROZAC), OMEPRAZOLE, INHALER IF NEEDED,                                 You may not have any metal on your body including hair pins and              piercings  Do not wear jewelry, make-up, lotions, powders or perfumes, deodorant             Do not wear nail polish.  Do not shave  48 hours prior to surgery.     Do not bring valuables to the hospital. [place].  Contacts, dentures or bridgework may not be worn into surgery.  Leave suitcase in the car. After surgery it may be brought to your room.               Please read over the following fact sheets you were given: _____________________________________________________________________               CLEAR LIQUID DIET   Foods Allowed                                                                     Foods Excluded  Coffee and tea, regular and decaf                             liquids that you cannot  Plain Jell-O in any flavor                                             see through such as: Fruit ices (not with fruit pulp)                                     milk, soups, orange juice  Iced Popsicles                                    All solid food Carbonated beverages, regular and diet  Cranberry, grape and apple juices Sports drinks like  Gatorade Lightly seasoned clear broth or consume(fat free) Sugar, honey syrup  Sample Menu Breakfast                                Lunch                                     Supper Cranberry juice                    Beef broth                            Chicken broth Jell-O                                     Grape juice                           Apple juice Coffee or tea                        Jell-O                                      Popsicle                                                Coffee or tea                        Coffee or tea  _____________________________________________________________________  [facility] - Preparing for Surgery Before surgery, you can play an important role.  Because skin is not sterile, your skin needs to be as free of germs as possible.  You can reduce the number of germs on your skin by washing with CHG (chlorahexidine gluconate) soap before surgery.  CHG is an antiseptic cleaner which kills germs and bonds with the skin to continue killing germs even after washing. Please DO NOT use if you have an allergy to CHG or antibacterial soaps.  If your skin becomes reddened/irritated stop using the CHG and inform your nurse when you arrive at Short Stay. Do not shave (including legs and underarms) for at least 48 hours prior to the first CHG shower.  You may shave your face/neck. Please follow these instructions carefully:  1.  Shower with CHG Soap the night before surgery and the  morning of Surgery.  2.  If you choose to wash your hair, wash your hair first as usual with your  normal  shampoo.  3.  After you shampoo, rinse your hair and body thoroughly to remove the  shampoo.                           4.  Use CHG as you would any other liquid soap.  You can apply chg directly  to the skin and wash  Gently with a scrungie or clean washcloth.  5.  Apply the CHG Soap to your body ONLY FROM THE NECK DOWN.   Do not use on face/ open                            Wound or open sores. Avoid contact with eyes, ears mouth and genitals (private parts).                       Wash face,  Genitals (private parts) with your normal soap.             6.  Wash thoroughly, paying special attention to the area where your surgery  will be performed.  7.  Thoroughly rinse your body with warm water from the neck down.  8.  DO NOT shower/wash with your normal soap after using and rinsing off  the CHG Soap.                9.  Pat yourself dry with a clean towel.            10.  Wear clean pajamas.            11.  Place clean sheets on your bed the night of your first shower and do not  sleep with pets. Day of Surgery : Do not apply any lotions/deodorants the morning of surgery.  Please wear clean clothes to the hospital/surgery center.  FAILURE TO FOLLOW THESE INSTRUCTIONS MAY RESULT IN THE CANCELLATION OF YOUR SURGERY PATIENT SIGNATURE_________________________________  NURSE SIGNATURE__________________________________  ________________________________________________________________________

## 2017-03-09 ENCOUNTER — Encounter (HOSPITAL_COMMUNITY)
Admission: RE | Admit: 2017-03-09 | Discharge: 2017-03-09 | Disposition: A | Discharge status: Home / Self Care | Payer: Managed Care, Other (non HMO) | Source: Ambulatory Visit | Attending: Surgery | Admitting: Surgery

## 2017-03-09 ENCOUNTER — Encounter (HOSPITAL_COMMUNITY): Payer: Self-pay

## 2017-03-09 ENCOUNTER — Other Ambulatory Visit: Payer: Self-pay

## 2017-03-09 DIAGNOSIS — K2270 Barrett's esophagus without dysplasia: Secondary | ICD-10-CM | POA: Insufficient documentation

## 2017-03-09 DIAGNOSIS — R12 Heartburn: Secondary | ICD-10-CM | POA: Diagnosis not present

## 2017-03-09 DIAGNOSIS — Z01818 Encounter for other preprocedural examination: Secondary | ICD-10-CM | POA: Insufficient documentation

## 2017-03-09 DIAGNOSIS — K449 Diaphragmatic hernia without obstruction or gangrene: Secondary | ICD-10-CM | POA: Insufficient documentation

## 2017-03-09 LAB — BASIC METABOLIC PANEL
Anion gap: 9 (ref 5–15)
BUN: 10 mg/dL (ref 6–20)
CHLORIDE: 101 mmol/L (ref 101–111)
CO2: 28 mmol/L (ref 22–32)
Calcium: 9 mg/dL (ref 8.9–10.3)
Creatinine, Ser: 0.7 mg/dL (ref 0.44–1.00)
GFR calc Af Amer: >60 mL/min (ref >60)
GLUCOSE: 97 mg/dL (ref 65–99)
POTASSIUM: 3.3 mmol/L — AB (ref 3.5–5.1)
Sodium: 138 mmol/L (ref 135–145)

## 2017-03-09 LAB — CBC
HEMATOCRIT: 36.3 % (ref 36.0–46.0)
Hemoglobin: 11.5 g/dL — ABNORMAL LOW (ref 12.0–15.0)
MCH: 26.3 pg (ref 26.0–34.0)
MCHC: 31.7 g/dL (ref 30.0–36.0)
MCV: 83.1 fL (ref 78.0–100.0)
Platelets: 400 10*3/uL (ref 150–400)
RBC: 4.37 MIL/uL (ref 3.87–5.11)
RDW: 13.7 % (ref 11.5–15.5)
WBC: 7 10*3/uL (ref 4.0–10.5)

## 2017-03-15 NOTE — Anesthesia Preprocedure Evaluation (Addendum)
Anesthesia Evaluation  Patient identified by MRN, date of birth, ID band Patient awake    Reviewed: Allergy & Precautions, H&P , NPO status , Patient's Chart, lab work & pertinent test results  Airway Mallampati: II  TM Distance: >3 FB Neck ROM: Full    Dental  (+) Teeth Intact, Dental Advisory Given   Pulmonary asthma ,    Pulmonary exam normal breath sounds clear to auscultation       Cardiovascular hypertension, Pt. on medications  Rhythm:Regular Rate:Normal     Neuro/Psych  Headaches, Anxiety Depression    GI/Hepatic Neg liver ROS, hiatal hernia, GERD  Medicated and Controlled,  Endo/Other  Morbid obesity  Renal/GU negative Renal ROS     Musculoskeletal negative musculoskeletal ROS (+)   Abdominal (+) + obese,   Peds  Hematology  (+) Blood dyscrasia, anemia , T-cell Lymphoma   Anesthesia Other Findings HSV  Reproductive/Obstetrics                            Anesthesia Physical  Anesthesia Plan  ASA: III  Anesthesia Plan: General   Post-op Pain Management:    Induction: Intravenous  PONV Risk Score and Plan: 4 or greater and Treatment may vary due to age or medical condition, Midazolam, Scopolamine patch - Pre-op, Dexamethasone and Ondansetron  Airway Management Planned: Oral ETT  Additional Equipment: None  Intra-op Plan:   Post-operative Plan: Extubation in OR  Informed Consent: I have reviewed the patients History and Physical, chart, labs and discussed the procedure including the risks, benefits and alternatives for the proposed anesthesia with the patient or authorized representative who has indicated his/her understanding and acceptance.   Dental advisory given  Plan Discussed with: CRNA  Anesthesia Plan Comments:         Anesthesia Quick Evaluation

## 2017-03-16 ENCOUNTER — Encounter (HOSPITAL_COMMUNITY)
Admission: AD | Disposition: A | Discharge status: Home / Self Care | Payer: Self-pay | Source: Ambulatory Visit | Attending: Surgery

## 2017-03-16 ENCOUNTER — Encounter (HOSPITAL_COMMUNITY): Payer: Self-pay | Admitting: *Deleted

## 2017-03-16 ENCOUNTER — Ambulatory Visit (HOSPITAL_COMMUNITY): Payer: Managed Care, Other (non HMO) | Admitting: Anesthesiology

## 2017-03-16 ENCOUNTER — Inpatient Hospital Stay (HOSPITAL_COMMUNITY)
Admission: AD | Admit: 2017-03-16 | Discharge: 2017-03-18 | DRG: 328 | Disposition: A | Discharge status: Home / Self Care | Payer: Managed Care, Other (non HMO) | Source: Ambulatory Visit | Attending: Surgery | Admitting: Surgery

## 2017-03-16 ENCOUNTER — Other Ambulatory Visit: Payer: Self-pay

## 2017-03-16 DIAGNOSIS — K449 Diaphragmatic hernia without obstruction or gangrene: Secondary | ICD-10-CM

## 2017-03-16 DIAGNOSIS — K2100 Gastro-esophageal reflux disease with esophagitis, without bleeding: Secondary | ICD-10-CM

## 2017-03-16 DIAGNOSIS — F329 Major depressive disorder, single episode, unspecified: Secondary | ICD-10-CM | POA: Diagnosis present

## 2017-03-16 DIAGNOSIS — I10 Essential (primary) hypertension: Secondary | ICD-10-CM | POA: Diagnosis present

## 2017-03-16 DIAGNOSIS — Z885 Allergy status to narcotic agent status: Secondary | ICD-10-CM

## 2017-03-16 DIAGNOSIS — Z79899 Other long term (current) drug therapy: Secondary | ICD-10-CM | POA: Diagnosis not present

## 2017-03-16 DIAGNOSIS — Z818 Family history of other mental and behavioral disorders: Secondary | ICD-10-CM | POA: Diagnosis not present

## 2017-03-16 DIAGNOSIS — Z6839 Body mass index (BMI) 39.0-39.9, adult: Secondary | ICD-10-CM | POA: Diagnosis not present

## 2017-03-16 DIAGNOSIS — D509 Iron deficiency anemia, unspecified: Secondary | ICD-10-CM | POA: Diagnosis present

## 2017-03-16 DIAGNOSIS — K660 Peritoneal adhesions (postprocedural) (postinfection): Secondary | ICD-10-CM | POA: Diagnosis present

## 2017-03-16 DIAGNOSIS — K219 Gastro-esophageal reflux disease without esophagitis: Secondary | ICD-10-CM | POA: Diagnosis present

## 2017-03-16 DIAGNOSIS — Z888 Allergy status to other drugs, medicaments and biological substances status: Secondary | ICD-10-CM | POA: Diagnosis not present

## 2017-03-16 DIAGNOSIS — K2270 Barrett's esophagus without dysplasia: Secondary | ICD-10-CM | POA: Diagnosis present

## 2017-03-16 DIAGNOSIS — Z8249 Family history of ischemic heart disease and other diseases of the circulatory system: Secondary | ICD-10-CM | POA: Diagnosis not present

## 2017-03-16 DIAGNOSIS — B009 Herpesviral infection, unspecified: Secondary | ICD-10-CM | POA: Diagnosis present

## 2017-03-16 DIAGNOSIS — J45909 Unspecified asthma, uncomplicated: Secondary | ICD-10-CM | POA: Diagnosis present

## 2017-03-16 DIAGNOSIS — K440 Diaphragmatic hernia with obstruction, without gangrene: Secondary | ICD-10-CM | POA: Diagnosis present

## 2017-03-16 DIAGNOSIS — E669 Obesity, unspecified: Secondary | ICD-10-CM | POA: Diagnosis present

## 2017-03-16 DIAGNOSIS — E785 Hyperlipidemia, unspecified: Secondary | ICD-10-CM | POA: Diagnosis present

## 2017-03-16 DIAGNOSIS — K5909 Other constipation: Secondary | ICD-10-CM | POA: Diagnosis present

## 2017-03-16 DIAGNOSIS — Z8572 Personal history of non-Hodgkin lymphomas: Secondary | ICD-10-CM

## 2017-03-16 DIAGNOSIS — R079 Chest pain, unspecified: Secondary | ICD-10-CM | POA: Diagnosis present

## 2017-03-16 DIAGNOSIS — F411 Generalized anxiety disorder: Secondary | ICD-10-CM | POA: Diagnosis present

## 2017-03-16 SURGERY — FUNDOPLICATION, NISSEN, ROBOT-ASSISTED, LAPAROSCOPIC
Anesthesia: General | Site: Abdomen

## 2017-03-16 MED ORDER — PROCHLORPERAZINE EDISYLATE 5 MG/ML IJ SOLN: 5.0000 mg | Freq: Four times a day (QID) | INTRAMUSCULAR | Status: DC | PRN

## 2017-03-16 MED ORDER — PROPOFOL 10 MG/ML IV BOLUS
INTRAVENOUS | Status: AC
  Filled 2017-03-16: qty 40

## 2017-03-16 MED ORDER — SUGAMMADEX SODIUM 500 MG/5ML IV SOLN
INTRAVENOUS | Status: AC
  Filled 2017-03-16: qty 5

## 2017-03-16 MED ORDER — PROPOFOL 10 MG/ML IV BOLUS
INTRAVENOUS | Status: AC
  Filled 2017-03-16: qty 20

## 2017-03-16 MED ORDER — PHENYLEPHRINE HCL 10 MG/ML IJ SOLN
INTRAMUSCULAR | Status: AC
  Filled 2017-03-16: qty 2

## 2017-03-16 MED ORDER — LACTATED RINGERS IV SOLN: 1000.0000 mL | Freq: Three times a day (TID) | INTRAVENOUS | Status: DC | PRN

## 2017-03-16 MED ORDER — PHENYLEPHRINE 40 MCG/ML (10ML) SYRINGE FOR IV PUSH (FOR BLOOD PRESSURE SUPPORT)
PREFILLED_SYRINGE | INTRAVENOUS | Status: AC
  Filled 2017-03-16: qty 10

## 2017-03-16 MED ORDER — ONDANSETRON 4 MG PO TBDP: 4.0000 mg | ORAL_TABLET | Freq: Four times a day (QID) | ORAL | Status: DC | PRN

## 2017-03-16 MED ORDER — ONDANSETRON HCL 4 MG/2ML IJ SOLN
INTRAMUSCULAR | Status: DC | PRN
  Administered 2017-03-16 (×2): 4 mg via INTRAVENOUS

## 2017-03-16 MED ORDER — GABAPENTIN 300 MG PO CAPS
300.0000 mg | ORAL_CAPSULE | ORAL | Status: AC
  Administered 2017-03-16: 300 mg via ORAL
  Filled 2017-03-16: qty 1

## 2017-03-16 MED ORDER — BISACODYL 10 MG RE SUPP: 10.0000 mg | Freq: Every day | RECTAL | Status: DC | PRN

## 2017-03-16 MED ORDER — PROPOFOL 10 MG/ML IV BOLUS
INTRAVENOUS | Status: DC | PRN
  Administered 2017-03-16: 200 mg via INTRAVENOUS

## 2017-03-16 MED ORDER — ACETAMINOPHEN 500 MG PO TABS
1000.0000 mg | ORAL_TABLET | Freq: Three times a day (TID) | ORAL | Status: DC
  Administered 2017-03-16 – 2017-03-18 (×5): 1000 mg via ORAL
  Filled 2017-03-16 (×6): qty 2

## 2017-03-16 MED ORDER — DIPHENHYDRAMINE HCL 50 MG/ML IJ SOLN: 12.5000 mg | Freq: Four times a day (QID) | INTRAMUSCULAR | Status: DC | PRN

## 2017-03-16 MED ORDER — LACTATED RINGERS IV SOLN
INTRAVENOUS | Status: DC
  Administered 2017-03-16 (×4): via INTRAVENOUS

## 2017-03-16 MED ORDER — PROMETHAZINE HCL 25 MG/ML IJ SOLN: 6.2500 mg | INTRAMUSCULAR | Status: DC | PRN

## 2017-03-16 MED ORDER — SUGAMMADEX SODIUM 500 MG/5ML IV SOLN
INTRAVENOUS | Status: DC | PRN
  Administered 2017-03-16: 400 mg via INTRAVENOUS

## 2017-03-16 MED ORDER — HYDROCORTISONE 2.5 % RE CREA
1.0000 "application " | TOPICAL_CREAM | Freq: Four times a day (QID) | RECTAL | Status: DC | PRN
  Filled 2017-03-16: qty 28.35

## 2017-03-16 MED ORDER — ROCURONIUM BROMIDE 50 MG/5ML IV SOSY
PREFILLED_SYRINGE | INTRAVENOUS | Status: AC
  Filled 2017-03-16: qty 5

## 2017-03-16 MED ORDER — ENSURE PRE-SURGERY PO LIQD
296.0000 mL | Freq: Once | ORAL | Status: AC
  Administered 2017-03-16: 237 mL via ORAL
  Filled 2017-03-16: qty 296

## 2017-03-16 MED ORDER — ONDANSETRON HCL 4 MG PO TABS: 4.0000 mg | ORAL_TABLET | Freq: Three times a day (TID) | ORAL | 5 refills | Status: DC | PRN

## 2017-03-16 MED ORDER — LIDOCAINE 2% (20 MG/ML) 5 ML SYRINGE
INTRAMUSCULAR | Status: DC | PRN
  Administered 2017-03-16: 100 mg via INTRAVENOUS

## 2017-03-16 MED ORDER — LIDOCAINE HCL 2 % IJ SOLN
INTRAMUSCULAR | Status: AC
  Filled 2017-03-16: qty 20

## 2017-03-16 MED ORDER — EPHEDRINE 5 MG/ML INJ
INTRAVENOUS | Status: AC
  Filled 2017-03-16: qty 10

## 2017-03-16 MED ORDER — SUCCINYLCHOLINE CHLORIDE 200 MG/10ML IV SOSY
PREFILLED_SYRINGE | INTRAVENOUS | Status: AC
  Filled 2017-03-16: qty 10

## 2017-03-16 MED ORDER — MENTHOL 3 MG MT LOZG: 1.0000 | LOZENGE | OROMUCOSAL | Status: DC | PRN

## 2017-03-16 MED ORDER — FENTANYL CITRATE (PF) 100 MCG/2ML IJ SOLN
INTRAMUSCULAR | Status: DC | PRN
  Administered 2017-03-16: 50 ug via INTRAVENOUS
  Administered 2017-03-16 (×4): 25 ug via INTRAVENOUS
  Administered 2017-03-16 (×2): 50 ug via INTRAVENOUS

## 2017-03-16 MED ORDER — SODIUM CHLORIDE 0.9 % IV SOLN
INTRAVENOUS | Status: DC
  Administered 2017-03-16: 50 mL/h via INTRAVENOUS

## 2017-03-16 MED ORDER — BUPIVACAINE-EPINEPHRINE 0.25% -1:200000 IJ SOLN
INTRAMUSCULAR | Status: DC | PRN
  Administered 2017-03-16: 50 mL

## 2017-03-16 MED ORDER — PROMETHAZINE HCL 25 MG RE SUPP: 25.0000 mg | Freq: Four times a day (QID) | RECTAL | 5 refills | Status: DC | PRN

## 2017-03-16 MED ORDER — HYDROCORTISONE 1 % EX CREA
1.0000 "application " | TOPICAL_CREAM | Freq: Three times a day (TID) | CUTANEOUS | Status: DC | PRN
  Filled 2017-03-16: qty 28

## 2017-03-16 MED ORDER — LIDOCAINE 2% (20 MG/ML) 5 ML SYRINGE
INTRAMUSCULAR | Status: DC | PRN
  Administered 2017-03-16: 1 mg/kg/h via INTRAVENOUS

## 2017-03-16 MED ORDER — ONDANSETRON HCL 4 MG/2ML IJ SOLN
INTRAMUSCULAR | Status: AC
  Filled 2017-03-16: qty 2

## 2017-03-16 MED ORDER — MIDAZOLAM HCL 5 MG/5ML IJ SOLN
INTRAMUSCULAR | Status: DC | PRN
  Administered 2017-03-16: 2 mg via INTRAVENOUS

## 2017-03-16 MED ORDER — PROPOFOL 500 MG/50ML IV EMUL
INTRAVENOUS | Status: DC | PRN
  Administered 2017-03-16: 120 ug/kg/min via INTRAVENOUS

## 2017-03-16 MED ORDER — KETAMINE HCL 10 MG/ML IJ SOLN
INTRAMUSCULAR | Status: AC
  Filled 2017-03-16: qty 1

## 2017-03-16 MED ORDER — BUPIVACAINE LIPOSOME 1.3 % IJ SUSP
20.0000 mL | Freq: Once | INTRAMUSCULAR | Status: AC
  Administered 2017-03-16: 20 mL
  Filled 2017-03-16: qty 20

## 2017-03-16 MED ORDER — ZOLPIDEM TARTRATE 5 MG PO TABS: 5.0000 mg | ORAL_TABLET | Freq: Every evening | ORAL | Status: DC | PRN

## 2017-03-16 MED ORDER — CEFTRIAXONE SODIUM 2 G IJ SOLR
2.0000 g | INTRAMUSCULAR | Status: AC
  Administered 2017-03-16: 2 g via INTRAVENOUS
  Filled 2017-03-16: qty 2

## 2017-03-16 MED ORDER — METRONIDAZOLE IN NACL 5-0.79 MG/ML-% IV SOLN
500.0000 mg | INTRAVENOUS | Status: AC
  Administered 2017-03-16: 500 mg via INTRAVENOUS
  Filled 2017-03-16: qty 100

## 2017-03-16 MED ORDER — ROCURONIUM BROMIDE 50 MG/5ML IV SOSY
PREFILLED_SYRINGE | INTRAVENOUS | Status: AC
Start: 2017-03-16 — End: ?
  Filled 2017-03-16: qty 5

## 2017-03-16 MED ORDER — LACTATED RINGERS IR SOLN
Status: DC | PRN
  Administered 2017-03-16: 1000 mL

## 2017-03-16 MED ORDER — PHENYLEPHRINE HCL 10 MG/ML IJ SOLN
INTRAVENOUS | Status: DC | PRN
  Administered 2017-03-16: 15 ug/min via INTRAVENOUS

## 2017-03-16 MED ORDER — PHENYLEPHRINE 40 MCG/ML (10ML) SYRINGE FOR IV PUSH (FOR BLOOD PRESSURE SUPPORT)
PREFILLED_SYRINGE | INTRAVENOUS | Status: DC | PRN
  Administered 2017-03-16 (×3): 80 ug via INTRAVENOUS

## 2017-03-16 MED ORDER — 0.9 % SODIUM CHLORIDE (POUR BTL) OPTIME
TOPICAL | Status: DC | PRN
  Administered 2017-03-16: 2000 mL

## 2017-03-16 MED ORDER — CELECOXIB 200 MG PO CAPS
200.0000 mg | ORAL_CAPSULE | ORAL | Status: AC
  Administered 2017-03-16: 200 mg via ORAL
  Filled 2017-03-16: qty 1

## 2017-03-16 MED ORDER — OXYCODONE HCL 5 MG/5ML PO SOLN
5.0000 mg | Freq: Once | ORAL | Status: DC | PRN
  Filled 2017-03-16: qty 5

## 2017-03-16 MED ORDER — ROCURONIUM BROMIDE 10 MG/ML (PF) SYRINGE
PREFILLED_SYRINGE | INTRAVENOUS | Status: DC | PRN
  Administered 2017-03-16: 50 mg via INTRAVENOUS
  Administered 2017-03-16: 10 mg via INTRAVENOUS
  Administered 2017-03-16: 20 mg via INTRAVENOUS
  Administered 2017-03-16: 10 mg via INTRAVENOUS
  Administered 2017-03-16: 20 mg via INTRAVENOUS
  Administered 2017-03-16: 30 mg via INTRAVENOUS
  Administered 2017-03-16: 10 mg via INTRAVENOUS

## 2017-03-16 MED ORDER — LIP MEDEX EX OINT
1.0000 "application " | TOPICAL_OINTMENT | Freq: Two times a day (BID) | CUTANEOUS | Status: DC
  Administered 2017-03-17 – 2017-03-18 (×3): 1 via TOPICAL
  Filled 2017-03-16: qty 7

## 2017-03-16 MED ORDER — HYDROCODONE-ACETAMINOPHEN 5-325 MG PO TABS: 1.0000 | ORAL_TABLET | Freq: Four times a day (QID) | ORAL | 0 refills | Status: DC | PRN

## 2017-03-16 MED ORDER — MIDAZOLAM HCL 2 MG/2ML IJ SOLN
INTRAMUSCULAR | Status: AC
  Filled 2017-03-16: qty 2

## 2017-03-16 MED ORDER — DIPHENHYDRAMINE HCL 12.5 MG/5ML PO ELIX: 12.5000 mg | ORAL_SOLUTION | Freq: Four times a day (QID) | ORAL | Status: DC | PRN

## 2017-03-16 MED ORDER — METOPROLOL TARTRATE 5 MG/5ML IV SOLN: 5.0000 mg | Freq: Four times a day (QID) | INTRAVENOUS | Status: DC | PRN

## 2017-03-16 MED ORDER — POLYETHYLENE GLYCOL 3350 17 G PO PACK: 17.0000 g | PACK | Freq: Every day | ORAL | Status: DC | PRN

## 2017-03-16 MED ORDER — DEXAMETHASONE SODIUM PHOSPHATE 10 MG/ML IJ SOLN
INTRAMUSCULAR | Status: DC | PRN
  Administered 2017-03-16: 10 mg via INTRAVENOUS

## 2017-03-16 MED ORDER — PANTOPRAZOLE SODIUM 40 MG PO TBEC
40.0000 mg | DELAYED_RELEASE_TABLET | Freq: Two times a day (BID) | ORAL | Status: DC
  Administered 2017-03-16 – 2017-03-18 (×4): 40 mg via ORAL
  Filled 2017-03-16 (×4): qty 1

## 2017-03-16 MED ORDER — EPHEDRINE SULFATE-NACL 50-0.9 MG/10ML-% IV SOSY
PREFILLED_SYRINGE | INTRAVENOUS | Status: DC | PRN
  Administered 2017-03-16: 15 mg via INTRAVENOUS
  Administered 2017-03-16 (×3): 10 mg via INTRAVENOUS
  Administered 2017-03-16: 5 mg via INTRAVENOUS

## 2017-03-16 MED ORDER — LACTATED RINGERS IV SOLN
INTRAVENOUS | Status: DC | PRN
  Administered 2017-03-16 (×2): via INTRAVENOUS

## 2017-03-16 MED ORDER — ALUM & MAG HYDROXIDE-SIMETH 200-200-20 MG/5ML PO SUSP: 30.0000 mL | Freq: Four times a day (QID) | ORAL | Status: DC | PRN

## 2017-03-16 MED ORDER — KETAMINE HCL 10 MG/ML IJ SOLN
INTRAMUSCULAR | Status: DC | PRN
  Administered 2017-03-16 (×3): 10 mg via INTRAVENOUS
  Administered 2017-03-16: 20 mg via INTRAVENOUS

## 2017-03-16 MED ORDER — GUAIFENESIN-DM 100-10 MG/5ML PO SYRP: 10.0000 mL | ORAL_SOLUTION | ORAL | Status: DC | PRN

## 2017-03-16 MED ORDER — FENTANYL CITRATE (PF) 100 MCG/2ML IJ SOLN
INTRAMUSCULAR | Status: AC
  Filled 2017-03-16: qty 2

## 2017-03-16 MED ORDER — DEXAMETHASONE SODIUM PHOSPHATE 10 MG/ML IJ SOLN
INTRAMUSCULAR | Status: AC
  Filled 2017-03-16: qty 1

## 2017-03-16 MED ORDER — FLUTICASONE PROPIONATE 50 MCG/ACT NA SUSP
2.0000 | Freq: Every day | NASAL | Status: DC
  Filled 2017-03-16: qty 16

## 2017-03-16 MED ORDER — PROCHLORPERAZINE MALEATE 10 MG PO TABS: 10.0000 mg | ORAL_TABLET | Freq: Four times a day (QID) | ORAL | Status: DC | PRN

## 2017-03-16 MED ORDER — FENTANYL CITRATE (PF) 100 MCG/2ML IJ SOLN
25.0000 ug | INTRAMUSCULAR | Status: DC | PRN
  Administered 2017-03-16: 50 ug via INTRAVENOUS

## 2017-03-16 MED ORDER — OXYCODONE HCL 5 MG PO TABS: 5.0000 mg | ORAL_TABLET | Freq: Once | ORAL | Status: DC | PRN

## 2017-03-16 MED ORDER — METHOCARBAMOL 500 MG PO TABS: 750.0000 mg | ORAL_TABLET | Freq: Four times a day (QID) | ORAL | Status: DC | PRN

## 2017-03-16 MED ORDER — LIDOCAINE 2% (20 MG/ML) 5 ML SYRINGE
INTRAMUSCULAR | Status: AC
  Filled 2017-03-16: qty 5

## 2017-03-16 MED ORDER — ACETAMINOPHEN 500 MG PO TABS
1000.0000 mg | ORAL_TABLET | ORAL | Status: AC
  Administered 2017-03-16: 1000 mg via ORAL
  Filled 2017-03-16: qty 2

## 2017-03-16 MED ORDER — BUPIVACAINE-EPINEPHRINE 0.25% -1:200000 IJ SOLN
INTRAMUSCULAR | Status: AC
  Filled 2017-03-16: qty 2

## 2017-03-16 MED ORDER — PHENOL 1.4 % MT LIQD
1.0000 | OROMUCOSAL | Status: DC | PRN
  Filled 2017-03-16: qty 177

## 2017-03-16 MED ORDER — BUDESONIDE 0.5 MG/2ML IN SUSP
0.5000 mg | Freq: Two times a day (BID) | RESPIRATORY_TRACT | Status: DC
  Filled 2017-03-16: qty 2

## 2017-03-16 MED ORDER — BENZONATATE 100 MG PO CAPS: 100.0000 mg | ORAL_CAPSULE | Freq: Three times a day (TID) | ORAL | Status: DC | PRN

## 2017-03-16 MED ORDER — ONDANSETRON HCL 4 MG/2ML IJ SOLN: 4.0000 mg | Freq: Four times a day (QID) | INTRAMUSCULAR | Status: DC | PRN

## 2017-03-16 MED ORDER — HYDRALAZINE HCL 20 MG/ML IJ SOLN: 5.0000 mg | INTRAMUSCULAR | Status: DC | PRN

## 2017-03-16 MED ORDER — MAGIC MOUTHWASH
15.0000 mL | Freq: Four times a day (QID) | ORAL | Status: DC | PRN
  Filled 2017-03-16: qty 15

## 2017-03-16 MED ORDER — ENOXAPARIN SODIUM 40 MG/0.4ML ~~LOC~~ SOLN
40.0000 mg | SUBCUTANEOUS | Status: DC
  Administered 2017-03-17 – 2017-03-18 (×2): 40 mg via SUBCUTANEOUS
  Filled 2017-03-16 (×2): qty 0.4

## 2017-03-16 MED ORDER — OXYCODONE HCL 5 MG PO TABS: 5.0000 mg | ORAL_TABLET | ORAL | Status: DC | PRN

## 2017-03-16 MED ORDER — FENTANYL CITRATE (PF) 250 MCG/5ML IJ SOLN
INTRAMUSCULAR | Status: AC
  Filled 2017-03-16: qty 5

## 2017-03-16 MED ORDER — HYDROMORPHONE HCL 1 MG/ML IJ SOLN
0.5000 mg | INTRAMUSCULAR | Status: DC | PRN
  Administered 2017-03-16 – 2017-03-17 (×3): 1 mg via INTRAVENOUS
  Filled 2017-03-16 (×3): qty 1

## 2017-03-16 MED ORDER — SIMETHICONE 80 MG PO CHEW: 40.0000 mg | CHEWABLE_TABLET | Freq: Four times a day (QID) | ORAL | Status: DC | PRN

## 2017-03-16 SURGICAL SUPPLY — 65 items
APPLICATOR COTTON TIP 6IN STRL (MISCELLANEOUS) ×12 IMPLANT
APPLIER CLIP 5 13 M/L LIGAMAX5 (MISCELLANEOUS) ×3
APPLIER CLIP ROT 10 11.4 M/L (STAPLE)
BLADE SURG SZ11 CARB STEEL (BLADE) ×3 IMPLANT
CHLORAPREP W/TINT 26ML (MISCELLANEOUS) ×3 IMPLANT
CLIP APPLIE 5 13 M/L LIGAMAX5 (MISCELLANEOUS) ×1 IMPLANT
CLIP APPLIE ROT 10 11.4 M/L (STAPLE) IMPLANT
COVER SURGICAL LIGHT HANDLE (MISCELLANEOUS) ×3 IMPLANT
COVER TIP SHEARS 8 DVNC (MISCELLANEOUS) ×1 IMPLANT
COVER TIP SHEARS 8MM DA VINCI (MISCELLANEOUS) ×2
DECANTER SPIKE VIAL GLASS SM (MISCELLANEOUS) ×3 IMPLANT
DRAIN CHANNEL 19F RND (DRAIN) ×3 IMPLANT
DRAIN PENROSE 18X1/2 LTX STRL (DRAIN) IMPLANT
DRAPE ARM DVNC X/XI (DISPOSABLE) ×4 IMPLANT
DRAPE COLUMN DVNC XI (DISPOSABLE) ×1 IMPLANT
DRAPE DA VINCI XI ARM (DISPOSABLE) ×8
DRAPE DA VINCI XI COLUMN (DISPOSABLE) ×2
DRAPE WARM FLUID 44X44 (DRAPE) ×3 IMPLANT
DRSG TEGADERM 2-3/8X2-3/4 SM (GAUZE/BANDAGES/DRESSINGS) ×3 IMPLANT
ELECT REM PT RETURN 15FT ADLT (MISCELLANEOUS) ×3 IMPLANT
ENDOLOOP SUT PDS II  0 18 (SUTURE)
ENDOLOOP SUT PDS II 0 18 (SUTURE) IMPLANT
EVACUATOR SILICONE 100CC (DRAIN) ×3 IMPLANT
GAUZE SPONGE 2X2 8PLY STRL LF (GAUZE/BANDAGES/DRESSINGS) ×1 IMPLANT
GLOVE ECLIPSE 8.0 STRL XLNG CF (GLOVE) ×6 IMPLANT
GLOVE INDICATOR 8.0 STRL GRN (GLOVE) ×6 IMPLANT
GOWN STRL REUS W/TWL XL LVL3 (GOWN DISPOSABLE) ×12 IMPLANT
IRRIG SUCT STRYKERFLOW 2 WTIP (MISCELLANEOUS) ×3
IRRIGATION SUCT STRKRFLW 2 WTP (MISCELLANEOUS) ×1 IMPLANT
KIT BASIN OR (CUSTOM PROCEDURE TRAY) ×3 IMPLANT
KIT DEFENDO BUTTON (KITS) ×3 IMPLANT
MESH PHASIX RESORB RECT 10X15 (Mesh General) ×3 IMPLANT
NEEDLE HYPO 22GX1.5 SAFETY (NEEDLE) ×3 IMPLANT
NEEDLE INSUFFLATION 14GA 120MM (NEEDLE) ×3 IMPLANT
NEEDLE SPNL 22GX3.5 QUINCKE BK (NEEDLE) ×3 IMPLANT
PACK CARDIOVASCULAR III (CUSTOM PROCEDURE TRAY) ×3 IMPLANT
PAD POSITIONING PINK XL (MISCELLANEOUS) ×3 IMPLANT
SCISSORS METZENBAUM CVD 44CM (INSTRUMENTS) ×3 IMPLANT
SEAL CANN UNIV 5-8 DVNC XI (MISCELLANEOUS) ×4 IMPLANT
SEAL XI 5MM-8MM UNIVERSAL (MISCELLANEOUS) ×8
SEALER VESSEL DA VINCI XI (MISCELLANEOUS) ×2
SEALER VESSEL EXT DVNC XI (MISCELLANEOUS) ×1 IMPLANT
SOLUTION ELECTROLUBE (MISCELLANEOUS) ×3 IMPLANT
SPONGE GAUZE 2X2 STER 10/PKG (GAUZE/BANDAGES/DRESSINGS) ×2
SPONGE LAP 18X18 X RAY DECT (DISPOSABLE) ×3 IMPLANT
SUT ETHIBOND 0 36 GRN (SUTURE) ×9 IMPLANT
SUT ETHIBOND NAB CT1 #1 30IN (SUTURE) ×6 IMPLANT
SUT MNCRL AB 4-0 PS2 18 (SUTURE) ×3 IMPLANT
SUT NYLON 3 0 (SUTURE) ×3 IMPLANT
SUT PROLENE 2 0 SH DA (SUTURE) ×3 IMPLANT
SUT V-LOC BARB 180 2/0GR6 GS22 (SUTURE)
SUTURE V-LC BRB 180 2/0GR6GS22 (SUTURE) IMPLANT
SYR 10ML LL (SYRINGE) ×3 IMPLANT
SYR 20CC LL (SYRINGE) ×3 IMPLANT
SYR 50ML LL SCALE MARK (SYRINGE) ×3 IMPLANT
TIP INNERVISION DETACH 40FR (MISCELLANEOUS) IMPLANT
TIP INNERVISION DETACH 50FR (MISCELLANEOUS) IMPLANT
TIP INNERVISION DETACH 56FR (MISCELLANEOUS) ×3 IMPLANT
TIPS INNERVISION DETACH 40FR (MISCELLANEOUS)
TOWEL OR 17X26 10 PK STRL BLUE (TOWEL DISPOSABLE) ×3 IMPLANT
TOWEL OR NON WOVEN STRL DISP B (DISPOSABLE) ×3 IMPLANT
TRAY FOLEY W/METER SILVER 16FR (SET/KITS/TRAYS/PACK) IMPLANT
TROCAR ADV FIXATION 5X100MM (TROCAR) ×3 IMPLANT
TUBING ENDO SMARTCAP PENTAX (MISCELLANEOUS) ×3 IMPLANT
TUBING INSUFFLATION 10FT LAP (TUBING) ×3 IMPLANT

## 2017-03-16 NOTE — H&P (Signed)
Anna Jackson  DOB: [birth year] Single / Language: [place] / Race: White Female  ` ` Patient sent for surgical consultation at the request of Dr. Wilfrid Lund  Chief Complaint: Recurrent hiatal hernia with intermittent chest pain, difficulty swallowing, worsening heartburn and reflux.  The patient is a pleasant obese female. History of open hiatal hernia repair and fundoplication 1308. Cholecystectomy done at that time. Had recurrent heartburn and reflux. Found to have recurrent sliding hiatal hernia with "partial fundoplication" 6578 upper endoscopy. Developed incisional hernia. Lap incisional hernia repair with mesh using 20 x 15 cm Parietex mesh 2004. That was her last abdominal surgery. Has a history of Barrett's esophagus. Found have episodes of intermittent chest pain. Negative cardiac and pulmonary workup. Has had worsening heartburn and reflux. Using omeprazole. That helps partially but still having occasional bad episodes. She's tried to adjust her diet. She is obese, but she and her boyfriend are working to intentionally lose weight. She often gets some early satiety and fullness. Been anemic. Sent to gastroenterology for anemia workup. Upper and lower endoscopy underwhelming except for and sent for Barrett's esophagus which is stable. However, her hiatal hernia is larger. Now at least half of her stomach up in her chest. With worsening symptoms and no other source for anemia, Cameron ulceration suspected as well. Surgical consultation requested to see if a candidate for reoperative foregut surgery.   Patient notes she gets short of breath winded after about 10-15 minutes of walking. She does not smoke. No cardiopulmonary issues. No history of COPD. She is not diabetic. No personal nor family history of GI/colon cancer, inflammatory bowel disease, irritable bowel syndrome, allergy such as Celiac Sprue, dietary/dairy problems, colitis, ulcers nor gastritis.  No recent sick contacts/gastroenteritis. No travel outside the country. No changes in diet. No hematochezia, hematemesis, coffee ground emesis. No evidence of prior gastric/peptic ulceration. Some concern of delayed gastric emptying on endoscopy. No obvious retained food seen on EGD.  (Review of systems as stated in this history (HPI) or in the review of systems. Otherwise all other 12 point ROS are negative)   Past Surgical History Anna Jackson, RMA; 01/25/2017 10:34 AM) Gallbladder Surgery - Open  Hysterectomy (not due to cancer) - Partial  Knee Surgery  Left. Open Inguinal Hernia Surgery  Bilateral. Oral Surgery   Diagnostic Studies History Anna Jackson, [place]; 01/25/2017 10:34 AM) Colonoscopy  within last year Mammogram  1-3 years ago Pap Smear  1-5 years ago  Allergies Anna Jackson, RMA; 01/25/2017 10:35 AM) Betadine *ANTISEPTICS & DISINFECTANTS*  Codeine Sulfate *ANALGESICS - OPIOID*  Allergies Reconciled   Medication History Anna Jackson, RMA; 01/25/2017 10:38 AM) FLUoxetine HCl (20MG  Tablet, Oral) Active. Klor-Con M20 [facility] Tablet ER, Oral) Active. Albuterol (90MCG/ACT Aerosol Soln, Inhalation) Active. Qvar (40MCG/ACT Aerosol Soln, Inhalation) Active. PROzac (20MG  Capsule, Oral) Active. HydroCHLOROthiazide (25MG  Tablet, Oral) Active. PriLOSEC (10MG  Packet, 2 tabs Oral) Active. Valtrex (500MG  Tablet, Oral) Active. Potassium Chloride (20MEQ Packet, Oral) Active. Medications Reconciled  Social History Anna Jackson, [place]; 01/25/2017 10:34 AM) Alcohol use  Occasional alcohol use. Caffeine use  Carbonated beverages, Coffee. No drug use  Tobacco use  Never smoker.  Family History Anna Jackson, [place]; 01/25/2017 10:34 AM) Alcohol Abuse  Brother. Depression  Brother, Mother, Sister, Anna Jackson. Heart Disease  Brother, Mother, Sister. Heart disease in female family member before age 28  Heart disease in female family member before age 43   Hypertension  Brother, Mother, Sister. Migraine Headache  Mother, Sister.  Pregnancy / Birth History Anna Jackson,  RMA; 01/25/2017 10:34 AM) Age at menarche  49 years. Age of menopause  18-50 Gravida  1 Maternal age  37-30 Para  1  Other Problems Anna Jackson, [place]; 01/25/2017 10:34 AM) Asthma  Cancer  Depression  Gastroesophageal Reflux Disease  Hemorrhoids  High blood pressure  Inguinal Hernia  Oophorectomy     Review of Systems Anna Jackson RMA; 01/25/2017 10:34 AM) General Not Present- Appetite Loss, Chills, Fatigue, Fever, Night Sweats, Weight Gain and Weight Loss. Skin Not Present- Change in Wart/Mole, Dryness, Hives, Jaundice, New Lesions, Non-Healing Wounds, Rash and Ulcer. HEENT Present- Wears glasses/contact lenses. Not Present- Earache, Hearing Loss, Hoarseness, Nose Bleed, Oral Ulcers, Ringing in the Ears, Seasonal Allergies, Sinus Pain, Sore Throat, Visual Disturbances and Yellow Eyes. Respiratory Present- Wheezing. Not Present- Bloody sputum, Chronic Cough, Difficulty Breathing and Snoring. Breast Not Present- Breast Mass, Breast Pain, Nipple Discharge and Skin Changes. Cardiovascular Present- Chest Pain and Shortness of Breath. Not Present- Difficulty Breathing Lying Down, Leg Cramps, Palpitations, Rapid Heart Rate and Swelling of Extremities. Gastrointestinal Present- Bloating, Gets full quickly at meals and Hemorrhoids. Not Present- Abdominal Pain, Bloody Stool, Change in Bowel Habits, Chronic diarrhea, Constipation, Difficulty Swallowing, Excessive gas, Indigestion, Nausea, Rectal Pain and Vomiting. Female Genitourinary Not Present- Frequency, Nocturia, Painful Urination, Pelvic Pain and Urgency. Musculoskeletal Not Present- Back Pain, Joint Pain, Joint Stiffness, Muscle Pain, Muscle Weakness and Swelling of Extremities. Neurological Not Present- Decreased Memory, Fainting, Headaches, Numbness, Seizures, Tingling, Tremor, Trouble walking and  Weakness. Psychiatric Not Present- Anxiety, Bipolar, Change in Sleep Pattern, Depression, Fearful and Frequent crying. Endocrine Not Present- Cold Intolerance, Excessive Hunger, Hair Changes, Heat Intolerance, Hot flashes and New Diabetes. Hematology Not Present- Blood Thinners, Easy Bruising, Excessive bleeding, Gland problems, HIV and Persistent Infections.  Vitals Anna Jackson RMA; 01/25/2017 10:35 AM) 01/25/2017 10:34 AM Weight: 263 lb Height: 68in Body Surface Area: 2.3 m Body Mass Index: 39.99 kg/m  Temp.: 98.23F  Pulse: 96 (Regular)  BP: 135/80 (Sitting, Left Arm, Standard)    There were no vitals taken for this visit.    Physical Exam [name] MD; 01/25/2017 11:03 AM) General Mental Status-Alert. General Appearance-Not in acute distress, Not Sickly. Orientation-Oriented X3. Hydration-Well hydrated. Voice-Normal.  Integumentary Global Assessment Upon inspection and palpation of skin surfaces of the - Axillae: non-tender, no inflammation or ulceration, no drainage. and Distribution of scalp and body hair is normal. General Characteristics Temperature - normal warmth is noted.  Head and Neck Head-normocephalic, atraumatic with no lesions or palpable masses. Face Global Assessment - atraumatic, no absence of expression. Neck Global Assessment - no abnormal movements, no bruit auscultated on the right, no bruit auscultated on the left, no decreased range of motion, non-tender. Trachea-midline. Thyroid Gland Characteristics - non-tender. Note: Wears glasses. Vision corrected   Eye Eyeball - Left-Extraocular movements intact, No Nystagmus. Eyeball - Right-Extraocular movements intact, No Nystagmus. Cornea - Left-No Hazy. Cornea - Right-No Hazy. Sclera/Conjunctiva - Left-No scleral icterus, No Discharge. Sclera/Conjunctiva - Right-No scleral icterus, No Discharge. Pupil - Left-Direct reaction to light  normal. Pupil - Right-Direct reaction to light normal.  ENMT Ears Pinna - Left - no drainage observed, no generalized tenderness observed. Right - no drainage observed, no generalized tenderness observed. Nose and Sinuses External Inspection of the Nose - no destructive lesion observed. Inspection of the nares - Left - quiet respiration. Right - quiet respiration. Mouth and Throat Lips - Upper Lip - no fissures observed, no pallor noted. Lower Lip - no fissures observed, no pallor noted.  Nasopharynx - no discharge present. Oral Cavity/Oropharynx - Tongue - no dryness observed. Oral Mucosa - no cyanosis observed. Hypopharynx - no evidence of airway distress observed.  Chest and Lung Exam Inspection Movements - Normal and Symmetrical. Accessory muscles - No use of accessory muscles in breathing. Palpation Palpation of the chest reveals - Non-tender. Auscultation Breath sounds - Normal and Clear.  Cardiovascular Auscultation Rhythm - Regular. Murmurs & Other Heart Sounds - Auscultation of the heart reveals - No Murmurs and No Systolic Clicks.  Abdomen Inspection Inspection of the abdomen reveals - No Visible peristalsis and No Abnormal pulsations. Umbilicus - No Bleeding, No Urine drainage. Palpation/Percussion Palpation and Percussion of the abdomen reveal - Soft, Non Tender, No Rebound tenderness, No Rigidity (guarding) and No Cutaneous hyperesthesia. Note: Abdomen soft. Not severely distended. No distasis recti. No umbilical or other anterior abdominal wall hernias   Female Genitourinary Sexual Maturity Tanner 5 - Adult hair pattern. Note: No vaginal bleeding nor discharge   Peripheral Vascular Upper Extremity Inspection - Left - No Cyanotic nailbeds, Not Ischemic. Right - No Cyanotic nailbeds, Not Ischemic.  Neurologic Neurologic evaluation reveals -normal attention span and ability to concentrate, able to name objects and repeat phrases. Appropriate fund of  knowledge , normal sensation and normal coordination. Mental Status Affect - not angry, not paranoid. Cranial Nerves-Normal Bilaterally. Gait-Normal.  Neuropsychiatric Mental status exam performed with findings of-able to articulate well with normal speech/language, rate, volume and coherence, thought content normal with ability to perform basic computations and apply abstract reasoning and no evidence of hallucinations, delusions, obsessions or homicidal/suicidal ideation.  Musculoskeletal Global Assessment Spine, Ribs and Pelvis - no instability, subluxation or laxity. Right Upper Extremity - no instability, subluxation or laxity.  Lymphatic Head & Neck  General Head & Neck Lymphatics: Bilateral - Description - No Localized lymphadenopathy. Axillary  General Axillary Region: Bilateral - Description - No Localized lymphadenopathy. Femoral & Inguinal  Generalized Femoral & Inguinal Lymphatics: Left - Description - No Localized lymphadenopathy. Right - Description - No Localized lymphadenopathy.    Assessment & Plan  HIATAL HERNIA WITH GERD AND ESOPHAGITIS (K44.9) Impression: Recurrent hiatal hernia status post open repair and fundoplication probably 20 years ago. Obese patient. Worsening heartburn. Barrett's esophagus. Worsening dysphagia. Occasional episodes of chest pain. Anemia with no other etiology. Probably due to Barrett's esophagus and cameras ulcerations.  I think she would benefit from repair of her recurrent incarcerated hiatal hernia. With this being a redo and her obesity, recurrence rate is higher. Would plan robotic lysis adhesions with reduction of hernia. Try to avoid the midline old mesh. Probably takedown of wrap and redo fundoplication. Intraoperative EGD. Increase esophagogastric and vagal injury risks. Weight loss surgery would be another option, but she would like to prefer with hiatal hernia repair first. She and her significant other are motivated for  nonoperative weight loss plan.  Normal gastric emptying study.  Manometry with near normal esophageal motility.  The anatomy & physiology of the foregut and anti-reflux mechanism was discussed. The pathophysiology of hiatal herniation and GERD was discussed. Natural history risks without surgery was discussed. The patient's symptoms are not adequately controlled by medicines and other non-operative treatments. I feel the risks of no intervention will lead to serious problems that outweigh the operative risks; therefore, I recommended surgery to reduce the hiatal hernia out of the chest and fundoplication to rebuild the anti-reflux valve and control reflux better. Need for a thorough workup to rule out the differential diagnosis and plan treatment  was explained. I explained laparoscopic techniques with possible need for an open approach.  Risks such as bleeding, infection, abscess, leak, need for further treatment, heart attack, death, and other risks were discussed. I noted a good likelihood this will help address the problem. Goals of post-operative recovery were discussed as well. Possibility that this will not correct all symptoms was explained. Post-operative dysphagia, need for short-term liquid & pureed diet, inability to vomit, possibility of reherniation, possible need for medicines to help control symptoms in addition to surgery were discussed. We will work to minimize complications. Educational handouts further explaining the pathology, treatment options, and dysphagia diet was given as well. Questions were answered. The patient expresses understanding & wishes to proceed with surgery.  Pt Education - [facility] Esophageal Surgery Diet HCI (Anna Jackson): discussed with patient and provided information. Pt Education - [facility] Laparoscopic Surgery HCI BARRETT'S ESOPHAGUS DETERMINED BY ENDOSCOPY (K22.70)  OBESITY (BMI 30-39.9) (E66.9) Impression: With her obesity, risk of hiatal hernia recurrence  higher. Would try and do a Phasix mesh at the same time of surgery. Did discuss weight loss surgery such as gastric bypass instead. They are working to intentionally lose weight, so she like to hold off on that option.  CHRONIC CONSTIPATION (K59.09)  Pt Education - [facility] Constipation (AT) Pt Education - [facility] Good Bowel Health (Anna Jackson)  [name], M.D., F.A.C.S. Gastrointestinal and Minimally Invasive Surgery [facility] [address] [place], [place] [address] [phone] Main / Paging

## 2017-03-16 NOTE — Addendum Note (Signed)
Addendum  created 03/16/17 1820 by Audry Pili, MD   Sign clinical note

## 2017-03-16 NOTE — Progress Notes (Signed)
Pt's nurse Terri called PACU nurse to question  numbness in pt's hand; PACU nurse notified Dr. Fransisco Beau, anesthesiologist, and he will check pt

## 2017-03-16 NOTE — Anesthesia Postprocedure Evaluation (Addendum)
Anesthesia Post Note  Patient: Anna Jackson  Procedure(s) Performed: XI ROBOTIC LYSIS OF ADHESIONS 3 HOURS WITH REDO OF HIATAL HERNIA REPAIR, RESORBABLE MESH IMPLANT, NISSEN FUNDOPLICATION (N/A Abdomen)     Patient location during evaluation: PACU Anesthesia Type: General Level of consciousness: awake and alert Pain management: pain level controlled Vital Signs Assessment: post-procedure vital signs reviewed and stable Respiratory status: spontaneous breathing, nonlabored ventilation, respiratory function stable and patient connected to nasal cannula oxygen Cardiovascular status: blood pressure returned to baseline and stable Postop Assessment: no apparent nausea or vomiting Anesthetic complications: no Comments: Complaining of numbness in right hand postoperatively. Patient first noticed it upon waking in PACU. Mainly involving ulnar and median nerve distributions. Upon re-evaluation at 1800, patient has some tingling sensation in distribution. Per chart, BP cuff was on left arm for majority of lengthy procedure. Arms were tucked at sides for case. Likely positioning related. Instructed patient to alert RN if not improved by morning and anesthesiologist should be notified to reassess.    Last Vitals:  Vitals:   03/16/17 1530 03/16/17 1545  BP: (!) 124/99 135/83  Pulse: 97 (!) 101  Resp: 18 18  Temp: (!) 36.3 C   SpO2: 100% 97%    Last Pain:  Vitals:   03/16/17 1545  TempSrc:   PainSc: [place]

## 2017-03-16 NOTE — Interval H&P Note (Signed)
History and Physical Interval Note:  03/16/2017 7:35 AM  Anna Jackson  has presented today for surgery, with the diagnosis of Recurrent Hiatal hernia with worsening heartburn, Cameron ulcers, Barrett's esophagus, intermittent dysphagia, intermittent chest pain.  The various methods of treatment have been discussed with the patient and family. After consideration of risks, benefits and other options for treatment, the patient has consented to  Procedure(s): XI ROBOTIC [place] (N/A) as a surgical intervention .  The patient's history has been reviewed, patient examined, no change in status, stable for surgery.  I have reviewed the patient's chart and labs.  Questions were answered to the patient's satisfaction.    I have re-reviewed the the patient's records, history, medications, and allergies.  I have re-examined the patient.  I again discussed intraoperative plans and goals of post-operative recovery.  The patient agrees to proceed.  Anna Jackson  May 02, 1960 [id number]  Patient Care Team: Carollee Herter, Alferd Apa, DO as PCP - General Fanny Skates, MD as Consulting Physician (General Surgery) Heath Lark, MD as Consulting Physician (Hematology and Oncology) Michael Boston, MD as Consulting Physician (General Surgery) Danis, Kirke Corin, MD as Consulting Physician (Gastroenterology) Ena Dawley, MD as Consulting Physician (Obstetrics and Gynecology) Garald Balding, MD as Consulting Physician (Orthopedic Surgery)  Patient Active Problem List   Diagnosis Date Noted  . Hiatal hernia   . Iron deficiency anemia 07/16/2016  . Acute bronchitis 06/14/2016  . Vitamin B12 deficiency 10/10/2014  . Neuropathic pain 04/04/2014  . Hyperlipidemia 03/12/2014  . Obesity (BMI 30-39.9) 01/08/2014  . 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 Headache 05/16/2013  . History of  lymphoma 03/20/2013  . Morbid obesity ([place]) 08/08/2012  . Barrett's esophagus 08/10/2010  . Heme positive stool 06/30/2010  . Depression 03/04/2009  . GERD 12/08/2007  . Anemia in neoplastic disease 12/08/2007  . POSTMENOPAUSAL STATUS 11/21/2007  . ANXIETY 05/09/2007  . Pain in joint, lower leg 10/20/2006  . HERPES SIMPLEX, UNCOMPLICATED 09/28/4816  . ONYCHOMYCOSIS, TOENAILS 05/27/2006  . Essential hypertension 04/26/2006  . ASTHMA 04/26/2006  . HEADACHE 04/26/2006  . UMBILICAL HERNIORRHAPHY, HX OF 04/26/2006    Past Medical History:  Diagnosis Date  . Adenopathy    left axillary  . Anemia   . Anxiety   . Anxiety state, unspecified   . Asthma    no problem with asthma in several years  . Barrett esophagus   . Depression   . Esophageal reflux   . Headache 05/16/2013  . Herpes simplex without mention of complication   . Hyperlipidemia 03/12/2014  . Hypertension   . 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 Mental disorder   . Neuropathic pain 04/04/2014  . Non Hodgkin's lymphoma ([place]) 2015  . Skin infection 11/02/2013  . Unspecified asthma(493.90)   . Unspecified essential hypertension   . URI (upper respiratory infection) 01/15/2014    Past Surgical History:  Procedure Laterality Date  . AXILLARY LYMPH NODE DISSECTION Left 03/09/2013   Procedure: EXCISION LEFT AXILLARY LYMPH NODES;  Surgeon: [name], MD;  Location: [facility];  Service: General;  Laterality: Left;  . BREAST BIOPSY Left 02/13/2013   high risk  . BREAST EXCISIONAL BIOPSY Left 03/09/2013   high risk  . CERVICAL CONIZATION W/BX    . CHOLECYSTECTOMY  1995   Open, done with Hiatal hernia repair  . ESOPHAGEAL MANOMETRY N/A 01/12/2017   Procedure: ESOPHAGEAL MANOMETRY (  EM);  Surgeon: Doran Stabler, MD;  Location: [facility];  Service: Gastroenterology;  Laterality: N/A;  . INCISIONAL HERNIA REPAIR  2004   Dr Nedra Hai.  Parietex 20x15cm mesh  . KNEE SURGERY Right 02/2010   Dr Shellia Carwin,  Arthroscopic  . NISSEN FUNDOPLICATION  1914   OPEN, Dr Lennie Hummer  . PORTACATH PLACEMENT N/A 03/23/2013   Procedure: INSERTION PORT-A-CATH;  Surgeon: [name], MD;  Location: [facility];  Service: General;  Laterality: N/A;  . TUBAL LIGATION    . VAGINAL HYSTERECTOMY  2002   Dr Art [place] History   Socioeconomic History  . Marital status: Single    Spouse name: Not on file  . Number of children: 1  . Years of education: Not on file  . Highest education level: Not on file  Social Needs  . Financial resource strain: Not on file  . Food insecurity - worry: Not on file  . Food insecurity - inability: Not on file  . Transportation needs - medical: Not on file  . Transportation needs - non-medical: Not on file  Occupational History  . Occupation: RECEIVING Armed forces operational officer: [employer]  Tobacco Use  . Smoking status: Never Smoker  . Smokeless tobacco: Never Used  Substance and Sexual Activity  . Alcohol use: Yes    Comment: occasional  . Drug use: No  . Sexual activity: Not Currently    Partners: Male  Other Topics Concern  . Not on file  Social History Narrative   Exercise--  treadmill    Family History  Problem Relation Age of Onset  . Heart disease Mother   . Coronary artery disease Unknown        with hernia repair  . Migraines Unknown   . Hypertension Unknown   . Breast cancer Maternal Grandmother 18  . Cancer Maternal Grandmother 50       breast  . Heart disease Brother   . Colon cancer Neg Hx     Medications Prior to Admission  Medication Sig Dispense Refill Last Dose  . albuterol (PROAIR HFA) 108 (90 Base) MCG/ACT inhaler 2 puffs q6h prn (Patient taking differently: Inhale 2 puffs into the lungs every 6 (six) hours as needed for shortness of breath. ) 1 Inhaler 2 Past Month at Unknown time  . Cholecalciferol (VITAMIN D) 2000 units tablet Take 2,000 Units by mouth daily.   03/15/2017 at [place]  . docusate sodium (COLACE) 100 MG capsule Take 100  mg by mouth daily.   03/15/2017 at [place]  . FLUoxetine (PROZAC) 20 MG tablet Take 1 tablet (20 mg total) by mouth daily. 90 tablet 2 03/16/2017 at 0520  . hydrochlorothiazide (HYDRODIURIL) 25 MG tablet Take 1 tablet (25 mg total) by mouth daily. 90 tablet 2 03/15/2017 at 0715  . ibuprofen (ADVIL,MOTRIN) 200 MG tablet Take 600 mg by mouth every 6 (six) hours as needed for mild pain.   Past Month at Unknown time  . omeprazole (PRILOSEC OTC) 20 MG tablet Take 1 tablet (20 mg total) by mouth daily as needed. 30 tablet 10 03/16/2017 at 0520  . vitamin B-12 (CYANOCOBALAMIN) 1000 MCG tablet Take 1,000 mcg by mouth daily.   03/15/2017 at [place]  . beclomethasone (QVAR REDIHALER) 40 MCG/ACT inhaler Inhale 2 puffs into the lungs 2 (two) times daily. (Patient not taking: Reported on 03/09/2017) 10.6 g 12 Not Taking at Unknown time  . benzonatate (TESSALON) 100 MG capsule Take 1 capsule (  100 mg total) by mouth 3 (three) times daily as needed for cough. (Patient not taking: Reported on 03/09/2017) 21 capsule 0 Not Taking at Unknown time  . doxycycline (VIBRA-TABS) 100 MG tablet Take 1 tablet (100 mg total) by mouth 2 (two) times daily. Can give caps or generic (Patient not taking: Reported on 03/09/2017) 20 tablet 0 Not Taking at Unknown time  . fluticasone (FLONASE) 50 MCG/ACT nasal spray Place 2 sprays into both nostrils daily. (Patient not taking: Reported on 03/09/2017) 16 g 1 Not Taking at Unknown time  . potassium chloride SA (K-DUR,KLOR-CON) 20 MEQ tablet Take 1 tablet (20 mEq total) by mouth daily. (Patient not taking: Reported on 03/09/2017) 30 tablet 2 Not Taking at Unknown time  . valACYclovir (VALTREX) 1000 MG tablet Take 1 tablet (1,000 mg total) by mouth 3 (three) times daily. (Patient taking differently: Take 1,000 mg by mouth See admin instructions. Takes three times a day for breakouts) 90 tablet 1 More than a month at Unknown time    Current Facility-Administered Medications  Medication Dose Route Frequency  Provider Last Rate Last Dose  . cefTRIAXone (ROCEPHIN) 2 g in dextrose 5 % 50 mL IVPB  2 g Intravenous On Call to OR Michael Boston, MD       And  . metroNIDAZOLE (FLAGYL) IVPB 500 mg  500 mg Intravenous On Call to OR Michael Boston, MD      . lactated ringers infusion   Intravenous Continuous Audry Pili, MD 75 mL/hr at 03/16/17 0732       Allergies  Allergen Reactions  . Betadine [Povidone Iodine] Hives and Itching  . Codeine     REACTION: SICK-NAUSEATED    BP (!) 136/92   Pulse 88   Temp 97.7 F (36.5 C) (Oral)   Resp 18   Ht 5\' 8"  (1.727 m)   Wt 117.9 kg (260 lb)   SpO2 98%   BMI 39.53 kg/m   Labs: No results found for this or any previous visit (from the past 48 hour(s)).  Imaging / Studies: Nm Gastric Emptying  Result Date: 03/02/2017 CLINICAL DATA:  Abdominal pain with reported acid reflux. EXAM: NUCLEAR MEDICINE GASTRIC EMPTYING SCAN TECHNIQUE: After oral ingestion of radiolabeled meal, sequential abdominal images were obtained for 120 minutes. Residual percentage of activity remaining within the stomach was calculated at 60 and 120 minutes. RADIOPHARMACEUTICALS:  2.1 mCi Tc-67m sulfur colloid in standardized meal including egg COMPARISON:  None. FINDINGS: Expected location of the stomach in the left upper quadrant. Ingested meal empties the stomach gradually over the course of the study with 64.4% retention at 60 min and 24.5% retention at 120 min (normal retention less than 30% at a 120 min). IMPRESSION: Normal gastric emptying study. Electronically Signed   By: Lowella Grip III M.D.   On: 03/02/2017 14:55     .[name], M.D., F.A.C.S. Gastrointestinal and Minimally Invasive Surgery [facility] [address] [place], [place] [address] [phone] Main / Paging  03/16/2017 7:36 AM    [name]

## 2017-03-16 NOTE — Interval H&P Note (Signed)
History and Physical Interval Note:  03/16/2017 7:35 AM  Anna Jackson  has presented today for surgery, with the diagnosis of Recurrent Hiatal hernia with worsening heartburn, Cameron ulcers, Barrett's esophagus, intermittent dysphagia, intermittent chest pain.  The various methods of treatment have been discussed with the patient and family. After consideration of risks, benefits and other options for treatment, the patient has consented to  Procedure(s): XI ROBOTIC [place] (N/A) as a surgical intervention .  The patient's history has been reviewed, patient examined, no change in status, stable for surgery.  I have reviewed the patient's chart and labs.  Questions were answered to the patient's satisfaction.     [name]

## 2017-03-16 NOTE — Op Note (Addendum)
03/16/2017  2:24 PM  PATIENT:  Anna Jackson  57 y.o. female  Patient Care Team: Carollee Herter, Alferd Apa, DO as PCP - General Fanny Skates, MD as Consulting Physician (General Surgery) Heath Lark, MD as Consulting Physician (Hematology and Oncology) Michael Boston, MD as Consulting Physician (General Surgery) Danis, Kirke Corin, MD as Consulting Physician (Gastroenterology) Ena Dawley, MD as Consulting Physician (Obstetrics and Gynecology) Garald Balding, MD as Consulting Physician (Orthopedic Surgery)  PRE-OPERATIVE DIAGNOSIS:  Recurrent Hiatal hernia with worsening heartburn, Lysbeth Galas ulcers, Barrett's esophagus, intermittent dysphagia, intermittent chest pain.  POST-OPERATIVE DIAGNOSIS:  Recurrent Hiatal hernia with worsening heartburn, Cameron ulcers, Barrett's esophagus, intermittent dysphagia, intermittent chest pain.  PROCEDURE:   0.  ROBOTIC LYSIS OF ADHESIONS 3 HOURS 1. Robotic reduction of paraesophageal hiatal hernia 2. Type II mediastinal dissection. 3. Primary repair of recurrent hiatal hernia over pledgets with RESORBABLE Phasix mesh reinforcement 4. Anterior & posterior gastropexy. 5. Nissen fundoplication 2 cm over a 56-French bougie  SURGEON:  [name], MD  ASSIST:  Nadeen Landau, MD An experienced assistant was required given the standard of surgical care given the complexity of the case.  This assistant was needed for exposure, dissection, suctioning, retraction, instrument exchange, etc.   ANESTHESIA:   local and general  EBL:  Total I/O In: 4000 [I.V.:4000] Out: 600 [Urine:500; Blood:100]  Delay start of Pharmacological VTE agent (>24hrs) due to surgical blood loss or risk of bleeding:  no  ANESTHESIA: 1. General anesthesia. 2. Local anesthetic in a field block around all port sites.  SPECIMEN:  Mediastinal hernia sac (not sent).  DRAINS:  A 19-French Blake drain goes from the right upper quadrant along the lesser  curvature of the stomach into the mediastinum.  COUNTS:  YES  PLAN OF CARE: Admit to inpatient   PATIENT DISPOSITION:  PACU - hemodynamically stable.  INDICATION:   Patient with obesity and recurrent symptomatic paraesophageal hiatal hernia.  Dysphasia, chest pain, Cameron ulcers, Barrett's esophagus.  Has been intentionally losing weight and declined referral for bariatric surgery.  Manometry with good esophageal motility.  Gastric emptying study with normal gastric emptying.  The patient has had extensive work-up & we feel the patient will benefit from repair:  The anatomy & physiology of the foregut and anti-reflux mechanism was discussed.  The pathophysiology of hiatal herniation and GERD was discussed.  Natural history risks without surgery was discussed.   The patient's symptoms are not adequately controlled by medicines and other non-operative treatments.  I feel the risks of no intervention will lead to serious problems that outweigh the operative risks; therefore, I recommended surgery to reduce the hiatal hernia out of the chest and fundoplication to rebuild the anti-reflux valve and control reflux better.  Need for a thorough workup to rule out the differential diagnosis and plan treatment was explained.  I explained laparoscopic techniques with possible need for an open approach.  Risks such as bleeding, infection, abscess, leak, need for further treatment, heart attack, death, and other risks were discussed.   I noted a good likelihood this will help address the problem.  Goals of post-operative recovery were discussed as well.  Possibility that this will not correct all symptoms was explained.  Post-operative dysphagia, need for short-term liquid & pureed diet, inability to vomit, possibility of reherniation, possible need for medicines to help control symptoms in addition to surgery were discussed.  We will work to minimize complications.   Educational handouts further explaining the  pathology, treatment options,  and dysphagia diet was given as well.  Questions were answered.  The patient expresses understanding & wishes to proceed with surgery.  OR FINDINGS:   Moderate-sized paraesophageal hiatal hernia with 50% of the stomach in the mediastinum.  There was a 10x8 cm hiatal defect.  It is a primary repair over pledgets.  The crural closure was reinforced with absorbable mesh: Phasix Mesh (a knitted monofilament mesh scaffold using Poly-4-hydroxybutyrate (P4HB), a biologically derived, fully resorbable material)  The patient has a 2 cm Nissen fundoplication that was done over 56-French bougie.  The patient has had anterior and posterior gastropexies.  DESCRIPTION:   Informed consent was confirmed.  The patient received IV antibiotics prior to incision.  The underwent general anesthesia without difficulty.  A Foley catheter sterilely placed.  The patient was positioned in split leg with arms tucked. The abdomen was prepped and draped in the sterile fashion.  Surgical time-out confirmed our plan.  Peritoneal entry with a laparoscopic port was obtained using Varess spring needle entry technique in the right upper abdomen as the patient was positioned in reverse Trendelenburg.  I induced carbon dioxide insufflation.  No change in end tidal CO2 measurements.  She did not have great distention now.  I placed another varies needle in the left upper quadrant.  Had good return of air and had full symmetrical abdominal distention.  Initial port was carefully placed.  Camera inspection revealed large swath of omental adhesions to the center upper abdomen with dense adhesions to Parietex mesh.  No injury.  I placed an extra port and proceeded with laparoscopic lysis adhesions to free the greater omentum in the central abdomen towards bilateral upper quadrants.  With this I could place extra ports under direct laparoscopic visualization.   Confirmed the Varess needle and not cause injury or  other abnormality.  We docked the [employer].  Proceeded with robotic lysis adhesions using sharp scissors with focused cautery.  Release dense omental adhesions off of a moderate size sheet of pariah techs in the mid central abdomen.  Was able to get through that and come up to the left lateral sector of the liver.  Freed adhesions off the left upper quadrant and anterior diaphragm on the left side.  Carried over towards the right upper quadrant as well to make sure had adhesions down so the ports could move without difficulty or injury.  Then focused on releasing adhesions to the left lateral sector of the liver on its inferior edge and its posterior side.  Was able to see the right crura.  The left lateral sector liver actually laid away from the obvious hiatal hernia.  I do not feel I needed a liver retractor for the case so it was not placed.  I confirmed a moderate size hiatal hernia with stomach and omentum going up into the chest.  I can release some omentum but there was obvious lesion incarcerated with dense adhesions to a moderate sized mediastinal hernia sac.  We grasped the anterior mediastinal sac at the apex of the crus.  I scored through that and got into the anterior mediastinum.  I was able to free the mediastinal sac from its attachments to the pericardium and bilateral pleura using primarily focused gentle blunt dissection as well as focused ultrasonic Harmonic dissection.  I transected phrenoesophageal attachments to the inner right crus, preserving a two centimeter cuff of mediastinal sac until I found the base of the crura.  I then came around anteriorly on the left  side and freed up the phrenoesophageal attachments of the mediastinal sac on the medial part of the left crus on the superior half.  I did careful mediastinal dissection to free the mediastinal sac.  With that, we could relieve the suction cup affect of the hernia sac and help reduce the stomach back down into the abdomen,  flipped back approriately.  Took some time but eventually freed off phrenoesophageal attachments circumferentially.  We released the attachments of the stomach to the retroperitoneum until we were able to connect with the prior dissection on the left crus.  We completed the release of phrenoesophageal attachments to the medial part of the left crus down to its base.  With this, we had circumferential mobilization.  Freed greater omentum off the lesser curvature of the stomach.  End up taking some more short gastric vessels down.  We placed the stomach and esophagus on axial tension.  I then did a Type II mediastinal dissection where I freed the esophagus from its attachments to the aorta, spine, pleura, and pericardium using primarily gentle blunt as well as focused ultrasonic dissection.  We saw the anterior & posterior vagus nerves intact.  We preserved it at all times.  I procedded to dissect about 20 cm proximally into the mediastinum.  With that I could straighten out the esophagus and get 4 cm of intra-abdominal length of the esophagus at a best estimation.  I freed adhesions of spleen and greater omentum off the left diaphragm encouraged to allow better left pleural mobilization.  I freed liver adhesions and other adhesions along the right crura down to the inferior vena cava.  That allowed more mobility.  She actually had some decent muscle although the actual fascia around the crura was fair.  I freed the anterior mediastinal sac off the esophagus & stomach.  I could not confirm that the patient really ever had a prior wrap per se but I did see some silk stitches nearby.  Suspicious for the wrap having split partially apart.  It looks more like Toupet partial posterior fundoplication.  Carefully free the right side of the wrap off the cardia of the stomach and brought around the left side.  Dissected the cardia and body circumferentially, preserving blood supply along the lesser curve and try to  preserve the gastroepiploic vessel.  We saw the anterior vagus nerve and freed the sac off of the vagus.  I dissected out & removed the fatty epiphrenic pads at the esophagogastric junction.  She had many of these.  With that, I could better define the esophagogastric junction.  I confirmed the the patient had 4 cm of intra-abdominal esophageal length off tension.  I did meticulous inspection confirmed no injury to the esophagus or stomach.  While she had dense adhesions, they were mostly soft and I could confirm serosa and other layers intact.  I brought the fundus of the stomach posterior to the esophagus over to the right side.  The wrap was mobile with the classic shoe shine maneuver.  Wrap became together gently.  We reflected the stomach left laterally and closed the esophageal hiatus using 0 Ethibond stitch using horizontal mattress stitches with pledgets on both sides.  I did that x4 stitches.  The crura had thin but good substance and they came together well without any tension.  I found an area in the retroperitoneum that was releasing milky fluid.  Tubular structure consistent most likely with a major lymphatic duct.  This was isolated clipped and  ligated.  This closed the lymphatic leak.  I decided to reinforce repair with absorbable mesh given her obesity, recurrence, thin tissues.  Using a 15 x 10 cm Phasix mesh.  I cut a U shaped slit in the in the transversely positioned mesh such that it looks like a broad U with a 5 cm limb & a 9 cm limb.  We rolled it in and laid it over the crural.  The narrow limb over the right crus & broader limb under the cardia & out left laterally.  Secured to the crura and diaphragm with 0 Ethibond interrupted sutures in the superomedial quarters in the superolateral corners.  Then secured the mesh to the crural closure using #1 Ethibond interrupted sutures x2.  I reset up the and Nissen wrap and did a posterior gastropexy by using those #1 Ethibond stitches to the  posterior part of the right side of the wrap and thru the mesh/crural closure and tied that down for good posterior gastropexy up against the hiatal closure.   I then did a left anterior gastropexy taking the apex of the left side of the wrap and tacked it to the left anterior crura just posterior to the phrenic vein.  Did the same on the right side.  Used that to help tack the mesh to the diaphragm and keep it away from the esophagus as it came through the hiatus.  I placed the stitches but did not tie them down yet.  Next, Anesthesia passed a 56-French bougie transorally.  It was a lighted bougie and we did do this under axial tension.  It passed down easily without resistance.    I tied down the anterior gastropexy sutures on both sides.  I then did a classic 2 cm Nissen fundoplication on the true esophagus above the cardia using Ethibond stitch in the left side of the wrap, then anterior esophagus, and then right side of the wrap and tied that down. Did 3 stitches.  I measured it and it was 2 cm in length.  We removed the bougie.  It was intact.    The wrap was soft and floppy.  The esophageal hiatus was snug but not tight.  I could easily pass a blunt 8 mm grasper under the wrap and up into the anterior mediastinum.  We have reassured all needles were out and counts were correct.  Removed hernia sacs and fat pads that have been debrided off as well.  I placed a drain as noted above.  I did irrigation and ensured hemostasis.  I saw no evidence of any leak or perforation or other abnormality.  I evacuated carbon dioxide and removed the ports.  The skin was closed with Monocryl and sterile dressings applied.  The patient is being extubated and brought back to the recovery room.  I discussed postop care in detail with the patient in in the office.  Discussed again with the patient in the holding area. I discussed operative findings, updated the patient's status, discussed probable steps to recovery, and  gave postoperative recommendations to the patient's significant other.  Recommendations were made.  Questions were answered.  He expressed understanding & appreciation.  [name], M.D., F.A.C.S. Gastrointestinal and Minimally Invasive Surgery [facility] [address] [place], [place] [address] [phone] Main / Paging

## 2017-03-16 NOTE — Discharge Instructions (Signed)
EATING AFTER YOUR ESOPHAGEAL SURGERY (Stomach Fundoplication, Hiatal Hernia repair, Achalasia surgery, etc)  ######################################################################  EAT Start with a pureed / full liquid diet (see below) Gradually transition to a high fiber diet with a fiber supplement over the next month after discharge.    WALK Walk an hour a day.  Control your pain to do that.    CONTROL PAIN Control pain so that you can walk, sleep, tolerate sneezing/coughing, go up/down stairs.  HAVE A BOWEL MOVEMENT DAILY Keep your bowels regular to avoid problems.  OK to try a laxative to override constipation.  OK to use an antidairrheal to slow down diarrhea.  Call if not better after 2 tries  CALL IF YOU HAVE PROBLEMS/CONCERNS Call if you are still struggling despite following these instructions. Call if you have concerns not answered by these instructions  ######################################################################   After your esophageal surgery, expect some sticking with swallowing over the next 1-2 months.    If food sticks when you eat, it is called "dysphagia".  This is due to swelling around your esophagus at the wrap & hiatal diaphragm repair.  It will gradually ease off over the next few months.  To help you through this temporary phase, we start you out on a pureed (blenderized) diet.  Your first meal in the hospital was thin liquids.  You should have been given a pureed diet by the time you left the hospital.  We ask patients to stay on a pureed diet for the first 2-3 weeks to avoid anything getting "stuck" near your recent surgery.  Don't be alarmed if your ability to swallow doesn't progress according to this plan.  Everyone is different and some diets can advance more or less quickly.     Some BASIC RULES to follow are:  Maintain an upright position whenever eating or drinking.  Take small bites - just a teaspoon size bite at a time.  Eat slowly.   It may also help to eat only one food at a time.  Consider nibbling through smaller, more frequent meals & avoid the urge to eat BIG meals  Do not push through feelings of fullness, nausea, or bloatedness  Do not mix solid foods and liquids in the same mouthful  Try not to "wash foods down" with large gulps of liquids.  Avoid carbonated (bubbly/fizzy) drinks.    Avoid foods that make you feel gassy or bloated.  Start with bland foods first.  Wait on trying greasy, fried, or spicy meals until you are tolerating more bland solids well.  Understand that it will be hard to burp and belch at first.  This gradually improves with time.  Expect to be more gassy/flatulent/bloated initially.  Walking will help your body manage it better.  Consider using medications for bloating that contain simethicone such as  Maalox or Gas-X   Eat in a relaxed atmosphere & minimize distractions.  Avoid talking while eating.    Do not use straws.  Following each meal, sit in an upright position (90 degree angle) for 60 to 90 minutes.  Going for a short walk can help as well  If food does stick, don't panic.  Try to relax and let the food pass on its own.  Sipping WARM LIQUID such as strong hot black tea can also help slide it down.   Be gradual in changes & use common sense:  -If you easily tolerating a certain "level" of foods, advance to the next level gradually -If you are  having trouble swallowing a particular food, then avoid it.   -If food is sticking when you advance your diet, go back to thinner previous diet (the lower LEVEL) for 1-2 days.  LEVEL 1 = PUREED DIET  Do for the first 2 WEEKS AFTER SURGERY  -Foods in this group are pureed or blenderized to a smooth, mashed potato-like consistency.  -If necessary, the pureed foods can keep their shape with the addition of a thickening agent.   -Meat should be pureed to a smooth, pasty consistency.  Hot broth or gravy may be added to the pureed  meat, approximately 1 oz. of liquid per 3 oz. serving of meat. -CAUTION:  If any foods do not puree into a smooth consistency, swallowing will be more difficult.  (For example, nuts or seeds sometimes do not blend well.)  Hot Foods Cold Foods  Pureed scrambled eggs and cheese Pureed cottage cheese  Baby cereals Thickened juices and nectars  Thinned cooked cereals (no lumps) Thickened milk or eggnog  Pureed [place] toast or pancakes Ensure  Mashed potatoes Ice cream  Pureed parsley, au gratin, scalloped potatoes, candied sweet potatoes Fruit or [place] ice, sherbet  Pureed buttered or alfredo noodles Plain yogurt  Pureed vegetables (no corn or peas) Instant breakfast  Pureed soups and creamed soups Smooth pudding, mousse, custard  Pureed scalloped apples Whipped gelatin  Gravies Sugar, syrup, honey, jelly  Sauces, cheese, tomato, barbecue, white, creamed Cream  Any baby food Creamer  Alcohol in moderation (not beer or champagne) Margarine  Coffee or tea Mayonnaise   Ketchup, mustard   Apple sauce   SAMPLE MENU:  PUREED DIET Breakfast Lunch Dinner   Orange juice, 1/2 cup  Cream of wheat, 1/2 cup  Pineapple juice, 1/2 cup  Pureed [place], barley soup, 3/4 cup  Pureed Hawaiian chicken, 3 oz   Scrambled eggs, mashed or blended with cheese, 1/2 cup  Tea or coffee, 1 cup   Whole milk, 1 cup   Non-dairy creamer, 2 Tbsp.  Mashed potatoes, 1/2 cup  Pureed cooled broccoli, 1/2 cup  Apple sauce, 1/2 cup  Coffee or tea  Mashed potatoes, 1/2 cup  Pureed spinach, 1/2 cup  Frozen yogurt, 1/2 cup  Tea or coffee      LEVEL 2 = SOFT DIET  After your first 2 weeks, you can advance to a soft diet.   Keep on this diet until everything goes down easily.  Hot Foods Cold Foods  White fish Cottage cheese  Stuffed fish Junior baby fruit  Baby food meals Semi thickened juices  Minced soft cooked, scrambled, poached eggs nectars  Souffle & omelets Ripe mashed bananas  Cooked  cereals Canned fruit, pineapple sauce, milk  potatoes Milkshake  Buttered or Alfredo noodles Custard  Cooked cooled vegetable Puddings, including tapioca  Sherbet Yogurt  Vegetable soup or alphabet soup Fruit ice, [place] ice  Gravies Whipped gelatin  Sugar, syrup, honey, jelly Junior baby desserts  Sauces:  Cheese, creamed, barbecue, tomato, white Cream  Coffee or tea Margarine   SAMPLE MENU:  LEVEL 2 Breakfast Lunch Dinner   Orange juice, 1/2 cup  Oatmeal, 1/2 cup  Scrambled eggs with cheese, 1/2 cup  Decaffeinated tea, 1 cup  Whole milk, 1 cup  Non-dairy creamer, 2 Tbsp  Pineapple juice, 1/2 cup  Minced beef, 3 oz  Gravy, 2 Tbsp  Mashed potatoes, 1/2 cup  Minced fresh broccoli, 1/2 cup  Applesauce, 1/2 cup  Coffee, 1 cup  [place], barley soup, 3/4 cup  Minced Hawaiian chicken, 3 oz  Mashed potatoes, 1/2 cup  Cooked spinach, 1/2 cup  Frozen yogurt, 1/2 cup  Non-dairy creamer, 2 Tbsp      LEVEL 3 = CHOPPED DIET  -After all the foods in level 2 (soft diet) are passing through well you should advance up to more chopped foods.  -It is still important to cut these foods into small pieces and eat slowly.  Hot Foods Cold Foods  Poultry Cottage cheese  Chopped Swedish meatballs Yogurt  Meat salads (ground or flaked meat) Milk  Flaked fish (tuna) Milkshakes  Poached or scrambled eggs Soft, cold, dry cereal  Souffles and omelets Fruit juices or nectars  Cooked cereals Chopped canned fruit  Chopped [place] toast or pancakes Canned fruit cocktail  Noodles or pasta (no rice) Pudding, mousse, custard  Cooked vegetables (no frozen peas, corn, or mixed vegetables) Green salad  Canned small sweet peas Ice cream  Creamed soup or vegetable soup Fruit ice, [place] ice  Pureed vegetable soup or alphabet soup Non-dairy creamer  Ground scalloped apples Margarine  Gravies Mayonnaise  Sauces:  Cheese, creamed, barbecue, tomato, white Ketchup  Coffee or tea Mustard    SAMPLE MENU:  LEVEL 3 Breakfast Lunch Dinner   Orange juice, 1/2 cup  Oatmeal, 1/2 cup  Scrambled eggs with cheese, 1/2 cup  Decaffeinated tea, 1 cup  Whole milk, 1 cup  Non-dairy creamer, 2 Tbsp  Ketchup, 1 Tbsp  Margarine, 1 tsp  Salt, 1/4 tsp  Sugar, 2 tsp  Pineapple juice, 1/2 cup  Ground beef, 3 oz  Gravy, 2 Tbsp  Mashed potatoes, 1/2 cup  Cooked spinach, 1/2 cup  Applesauce, 1/2 cup  Decaffeinated coffee  Whole milk  Non-dairy creamer, 2 Tbsp  Margarine, 1 tsp  Salt, 1/4 tsp  Pureed [place], barley soup, 3/4 cup  Barbecue chicken, 3 oz  Mashed potatoes, 1/2 cup  Ground fresh broccoli, 1/2 cup  Frozen yogurt, 1/2 cup  Decaffeinated tea, 1 cup  Non-dairy creamer, 2 Tbsp  Margarine, 1 tsp  Salt, 1/4 tsp  Sugar, 1 tsp    LEVEL 4:  REGULAR FOODS  -Foods in this group are soft, moist, regularly textured foods.   -This level includes meat and breads, which tend to be the hardest things to swallow.   -Eat very slowly, chew well and continue to avoid carbonated drinks. -most people are at this level in 4-6 weeks  Hot Foods Cold Foods  Baked fish or skinned Soft cheeses - cottage cheese  Souffles and omelets Cream cheese  Eggs Yogurt  Stuffed shells Milk  Spaghetti with meat sauce Milkshakes  Cooked cereal Cold dry cereals (no nuts, dried fruit, coconut)  [place] toast or pancakes Crackers  Buttered toast Fruit juices or nectars  Noodles or pasta (no rice) Canned fruit  Potatoes (all types) Ripe bananas  Soft, cooked vegetables (no corn, lima, or baked beans) Peeled, ripe, fresh fruit  Creamed soups or vegetable soup Cakes (no nuts, dried fruit, coconut)  Canned chicken noodle soup Plain doughnuts  Gravies Ice cream  Bacon dressing Pudding, mousse, custard  Sauces:  Cheese, creamed, barbecue, tomato, white Fruit ice, [place] ice, sherbet  Decaffeinated tea or coffee Whipped gelatin  Pork chops Regular gelatin   Canned fruited  gelatin molds   Sugar, syrup, honey, jam, jelly   Cream   Non-dairy   Margarine   Oil   Mayonnaise   Ketchup   Mustard   TROUBLESHOOTING IRREGULAR BOWELS  1) Avoid extremes of bowel  movements (no bad constipation/diarrhea)  °2) Miralax 17gm mixed in 8oz. water or juice-daily. May use BID as needed.  °3) Gas-x,Phazyme, etc. as needed for gas & bloating.  °4) Soft,bland diet. No spicy,greasy,fried foods.  °5) Prilosec over-the-counter as needed  °6) May hold gluten/wheat products from diet to see if symptoms improve.  °7) May try probiotics (Align, Activa, etc) to help calm the bowels down  °7) If symptoms become worse call back immediately. ° ° ° °If you have any questions please call our office at [facility]: [phone]. ° °LAPAROSCOPIC SURGERY: POST OP INSTRUCTIONS ° °###################################################################### ° °EAT °Gradually transition to a high fiber diet with a fiber supplement over the next few weeks after discharge.  Start with a pureed / full liquid diet (see below) ° °WALK °Walk an hour a day.  Control your pain to do that.   ° °CONTROL PAIN °Control pain so that you can walk, sleep, tolerate sneezing/coughing, go up/down stairs. ° °HAVE A BOWEL MOVEMENT DAILY °Keep your bowels regular to avoid problems.  OK to try a laxative to override constipation.  OK to use an antidairrheal to slow down diarrhea.  Call if not better after 2 tries ° °CALL IF YOU HAVE PROBLEMS/CONCERNS °Call if you are still struggling despite following these instructions. °Call if you have concerns not answered by these instructions ° °###################################################################### ° ° ° °1. DIET: Follow a light bland diet the first 24 hours after arrival home, such as soup, liquids, crackers, etc.  Be sure to include lots of fluids daily.  Avoid fast food or heavy meals as your are more likely to get nauseated.  Eat a low fat the next few days after  surgery.   °2. Take your usually prescribed home medications unless otherwise directed. °3. PAIN CONTROL: °a. Pain is best controlled by a usual combination of three different methods TOGETHER: °i. Ice/Heat °ii. Over the counter pain medication °iii. Prescription pain medication °b. Most patients will experience some swelling and bruising around the incisions.  Ice packs or heating pads (30-60 minutes up to 6 times a day) will help. Use ice for the first few days to help decrease swelling and bruising, then switch to heat to help relax tight/sore spots and speed recovery.  Some people prefer to use ice alone, heat alone, alternating between ice & heat.  Experiment to what works for you.  Swelling and bruising can take several weeks to resolve.   °c. It is helpful to take an over-the-counter pain medication regularly for the first few weeks.  Choose one of the following that works best for you: °i. Naproxen (Aleve, etc)  Two 220mg tabs twice a day °ii. Ibuprofen (Advil, etc) Three 200mg tabs four times a day (every meal & bedtime) °iii. Acetaminophen (Tylenol, etc) 500-650mg four times a day (every meal & bedtime) °d. A  prescription for pain medication (such as oxycodone, hydrocodone, etc) should be given to you upon discharge.  Take your pain medication as prescribed.  °i. If you are having problems/concerns with the prescription medicine (does not control pain, nausea, vomiting, rash, itching, etc), please call us [phone] to see if we need to switch you to a different pain medicine that will work better for you and/or control your side effect better. °ii. If you need a refill on your pain medication, please contact your pharmacy.  They will contact our office to request authorization. Prescriptions will not be filled after 5 pm or on week-ends. °4. Avoid getting   constipated.  Between the surgery and the pain medications, it is common to experience some constipation.  Increasing fluid intake and taking a  fiber supplement (such as Metamucil, Citrucel, FiberCon, MiraLax, etc) 1-2 times a day regularly will usually help prevent this problem from occurring.  A mild laxative (prune juice, Milk of Magnesia, MiraLax, etc) should be taken according to package directions if there are no bowel movements after 48 hours.   5. Watch out for diarrhea.  If you have many loose bowel movements, simplify your diet to bland foods & liquids for a few days.  Stop any stool softeners and decrease your fiber supplement.  Switching to mild anti-diarrheal medications (Kayopectate, Pepto Bismol) can help.  If this worsens or does not improve, please call us. 6. Wash / shower every day.  You may shower over the dressings as they are waterproof.  Continue to shower over incision(s) after the dressing is off. 7. Remove your waterproof bandages 5 days after surgery.  You may leave the incision open to air.  You may replace a dressing/Band-Aid to cover the incision for comfort if you wish.  8. ACTIVITIES as tolerated:   a. You may resume regular (light) daily activities beginning the next day--such as daily self-care, walking, climbing stairs--gradually increasing activities as tolerated.  If you can walk 30 minutes without difficulty, it is safe to try more intense activity such as jogging, treadmill, bicycling, low-impact aerobics, swimming, etc. b. Save the most intensive and strenuous activity for last such as sit-ups, heavy lifting, contact sports, etc  Refrain from any heavy lifting or straining until you are off narcotics for pain control.   c. DO NOT PUSH THROUGH PAIN.  Let pain be your guide: If it hurts to do something, don't do it.  Pain is your body warning you to avoid that activity for another week until the pain goes down. d. You may drive when you are no longer taking prescription pain medication, you can comfortably wear a seatbelt, and you can safely maneuver your car and apply brakes. e. Dennis Bast may have sexual intercourse  when it is comfortable.  9. FOLLOW UP in our office a. Please call [facility] at [phone] to set up an appointment to see your surgeon in the office for a follow-up appointment approximately 2-3 weeks after your surgery. b. Make sure that you call for this appointment the day you arrive home to insure a convenient appointment time. 10. IF YOU HAVE DISABILITY OR FAMILY LEAVE FORMS, BRING THEM TO THE OFFICE FOR PROCESSING.  DO NOT GIVE THEM TO YOUR DOCTOR.   WHEN TO CALL us [phone]: 1. Poor pain control 2. Reactions / problems with new medications (rash/itching, nausea, etc)  3. Fever over 101.5 F (38.5 C) 4. Inability to urinate 5. Nausea and/or vomiting 6. Worsening swelling or bruising 7. Continued bleeding from incision. 8. Increased pain, redness, or drainage from the incision   The clinic staff is available to answer your questions during regular business hours (8:30am-5pm).  Please dont hesitate to call and ask to speak to one of our nurses for clinical concerns.   If you have a medical emergency, go to the nearest emergency room or call 911.  A surgeon from [facility] is always on call at the [facility], [place], [place], [place], [place]  [address] ? MAIN: [phone] ? TOLL FREE: [phone] ?  FAX [phone] [email or web address]  Laparoscopic Nissen Fundoplication Laparoscopic Nissen fundoplication is surgery to relieve heartburn and other problems caused by gastric fluids flowing up into your esophagus. The esophagus is the tube that carries food and liquid from your throat to your stomach. Normally, the muscle that sits between your stomach and your esophagus (lower esophageal sphincter or LES) keeps stomach fluids in your stomach. In some people, the LES does not work properly, and stomach fluids flow up into the esophagus. This can happen when part of the stomach bulges through the LES  (hiatal hernia). The backward flow of stomach fluids can cause a type of severe and long-standing heartburn that is called gastroesophageal reflux disease (GERD). You may need this surgery if other treatments for GERD have not helped. In a laparoscopic Nissen fundoplication, the upper part of your stomach is wrapped around the lower part of your esophagus to strengthen the LES and prevent reflux. If you have a hiatal hernia, it will also be repaired with this surgery. The procedure is done through several small incisions in your abdomen. It is performed using a thin, telescopic instrument (laparoscope) and other instruments that can pass through the scope or through other small incisions. Tell a health care provider about:  Any allergies you have.  All medicines you are taking, including vitamins, herbs, eye drops, creams, and over-the-counter medicines.  Any problems you or family members have had with anesthetic medicines.  Any blood disorders you have.  Any surgeries you have had.  Any medical conditions you have. What are the risks? Generally, this is a safe procedure. However, problems may occur, including:  Difficulty swallowing (dysphagia).  Bloating.  Nausea or vomiting.  Damage to the lung, causing a collapsed lung.  Infection or bleeding.  What happens before the procedure?  Ask your health care provider about: ? Changing or stopping your regular medicines. This is especially important if you are taking diabetes medicines or blood thinners. ? Taking medicines such as aspirin and ibuprofen. These medicines can thin your blood. Do not take these medicines before your procedure if your health care provider asks you not to.  Follow your health care providers instructions about eating or drinking restrictions.  Plan to have someone take you home after the procedure. What happens during the procedure?  An IV tube will be inserted into one of your veins. It will be used to  give you fluids and medicines during the procedure.  You will be given a medicine that makes you fall asleep (general anesthetic).  Your abdomen will be cleaned with a germ-killing solution (antiseptic).  The surgeon will make a small incision in your abdomen and insert a tube through the incision.  Your abdomen will be filled with a gas. This helps the surgeon to see your organs more easily and it makes more space to work.  The surgeon will insert the laparoscope through the incision. The scope has a camera that will send pictures to a monitor in the operating room.  The surgeon will make several other small incisions in your abdomen to insert the other instruments that are needed during the procedure.  Another instrument (dilator) will be passed through your mouth and down your esophagus into the upper part of your stomach. The dilator will prevent your LES from being closed too tightly during surgery.  The surgeon will pass the top portion of your stomach behind the lower part of your esophagus and wrap it all the way around. This will be stitched into place.  If  you have a hiatal hernia, it will be repaired during this procedure.  All instruments will be removed, and your incisions will be closed under your skin with stitches (sutures). Skin adhesive strips may also be used.  A bandage (dressing) will be placed on your skin over the incisions. The procedure may vary among health care providers and hospitals. What happens after the procedure?  You will be moved to a recovery area.  Your blood pressure, heart rate, breathing rate, and blood oxygen level will be monitored often until the medicines you were given have worn off.  You will be given pain medicine as needed.  Your IV tube will be kept in until you are able to drink fluids. This information is not intended to replace advice given to you by your health care provider. Make sure you discuss any questions you have with your  health care provider. Document Released: 02/22/2014 Document Revised: 07/10/2015 Document Reviewed: 10/03/2013 Elsevier Interactive Patient Education  [employer].

## 2017-03-16 NOTE — Progress Notes (Signed)
Dr. Fransisco Beau came to check pt's hand due to complaint of numbness.  Will continue to monitor.

## 2017-03-16 NOTE — Anesthesia Procedure Notes (Signed)
Procedure Name: Intubation Date/Time: 03/16/2017 8:30 AM Performed by: Lind Covert, CRNA Pre-anesthesia Checklist: Patient identified, Emergency Drugs available, Suction available, Patient being monitored and Timeout performed Patient Re-evaluated:Patient Re-evaluated prior to induction Oxygen Delivery Method: Circle system utilized Preoxygenation: Pre-oxygenation with 100% oxygen Induction Type: IV induction Ventilation: Mask ventilation without difficulty Laryngoscope Size: Mac and 4 Grade View: Grade I Tube type: Oral Tube size: 7.5 mm Number of attempts: 1 Airway Equipment and Method: Stylet Placement Confirmation: ETT inserted through vocal cords under direct vision,  positive ETCO2 and breath sounds checked- equal and bilateral Secured at: 21 cm Tube secured with: Tape Dental Injury: Teeth and Oropharynx as per pre-operative assessment

## 2017-03-16 NOTE — Transfer of Care (Signed)
Immediate Anesthesia Transfer of Care Note  Patient: Anna Jackson  Procedure(s) Performed: XI ROBOTIC LYSIS OF ADHESIONS 3 HOURS WITH REDO OF HIATAL HERNIA REPAIR, RESORBABLE MESH IMPLANT, NISSEN FUNDOPLICATION (N/A Abdomen)  Patient Location: PACU  Anesthesia Type:General  Level of Consciousness: sedated  Airway & Oxygen Therapy: Patient Spontanous Breathing and Patient connected to face mask oxygen  Post-op Assessment: Report given to RN and Post -op Vital signs reviewed and stable  Post vital signs: Reviewed and stable  Last Vitals:  Vitals:   03/16/17 0636  BP: (!) 136/92  Pulse: 88  Resp: 18  Temp: 36.5 C  SpO2: 98%    Last Pain:  Vitals:   03/16/17 0636  TempSrc: Oral         Complications: No apparent anesthesia complications

## 2017-03-17 ENCOUNTER — Inpatient Hospital Stay (HOSPITAL_COMMUNITY): Payer: Managed Care, Other (non HMO)

## 2017-03-17 MED ORDER — FUROSEMIDE 10 MG/ML IJ SOLN
40.0000 mg | Freq: Once | INTRAMUSCULAR | Status: AC
  Administered 2017-03-17: 40 mg via INTRAVENOUS
  Filled 2017-03-17: qty 4

## 2017-03-17 MED ORDER — METOPROLOL TARTRATE 5 MG/5ML IV SOLN: 5.0000 mg | Freq: Four times a day (QID) | INTRAVENOUS | Status: DC | PRN

## 2017-03-17 MED ORDER — HYDRALAZINE HCL 20 MG/ML IJ SOLN: 5.0000 mg | INTRAMUSCULAR | Status: DC | PRN

## 2017-03-17 MED ORDER — SODIUM CHLORIDE 0.9% FLUSH: 3.0000 mL | INTRAVENOUS | Status: DC | PRN

## 2017-03-17 MED ORDER — SODIUM CHLORIDE 0.9 % IV SOLN: 250.0000 mL | INTRAVENOUS | Status: DC | PRN

## 2017-03-17 MED ORDER — IOPAMIDOL (ISOVUE-300) INJECTION 61%
INTRAVENOUS | Status: AC
Start: 2017-03-17 — End: 2017-03-17
  Filled 2017-03-17: qty 150

## 2017-03-17 MED ORDER — DEXAMETHASONE SODIUM PHOSPHATE 4 MG/ML IJ SOLN
4.0000 mg | Freq: Two times a day (BID) | INTRAMUSCULAR | Status: DC
  Administered 2017-03-17 – 2017-03-18 (×3): 4 mg via INTRAVENOUS
  Filled 2017-03-17 (×3): qty 1

## 2017-03-17 MED ORDER — SODIUM CHLORIDE 0.9% FLUSH
3.0000 mL | Freq: Two times a day (BID) | INTRAVENOUS | Status: DC
  Administered 2017-03-17 – 2017-03-18 (×2): 3 mL via INTRAVENOUS

## 2017-03-17 MED ORDER — PHENOL 1.4 % MT LIQD: 1.0000 | OROMUCOSAL | Status: DC | PRN

## 2017-03-17 MED ORDER — MENTHOL 3 MG MT LOZG: 1.0000 | LOZENGE | OROMUCOSAL | Status: DC | PRN

## 2017-03-17 NOTE — Progress Notes (Signed)
[facility]  [address] [place], [place] [address] Phone: [phone]  FAX: [place] [phone] 14-Jan-1961  CARE TEAM:  PCP: Ann Held, DO  Outpatient Care Team: Patient Care Team: Carollee Herter, Alferd Apa, DO as PCP - General Fanny Skates, MD as Consulting Physician (General Surgery) Heath Lark, MD as Consulting Physician (Hematology and Oncology) Michael Boston, MD as Consulting Physician (General Surgery) Danis, Kirke Corin, MD as Consulting Physician (Gastroenterology) Ena Dawley, MD as Consulting Physician (Obstetrics and Gynecology) Garald Balding, MD as Consulting Physician (Orthopedic Surgery)  Inpatient Treatment Team: Treatment Team: Attending Provider: Michael Boston, MD; Technician: Sueanne Margarita, NT   Problem List:   Active Problems:   Hiatal hernia with GERD and esophagitis   1 Day Post-Op  03/16/2017  Procedure(s): XI ROBOTIC LYSIS OF ADHESIONS 3 HOURS WITH REDO OF HIATAL HERNIA REPAIR, RESORBABLE MESH IMPLANT, NISSEN FUNDOPLICATION   Assessment  Recovering relatively well so far  Plan:  -Esophagram this morning.  If no leak or obstruction at wrap, advance diet to dysphasia 1 diet.  If evidence of partial obstruction, IV steroids for 72 hours and evaluate.  Thin liquids for now.  Advanced if esophagram is okay. -VTE prophylaxis- SCDs, etc -mobilize as tolerated to help recovery  20 minutes spent in review, evaluation, examination, counseling, and coordination of care.  More than 50% of that time was spent in counseling.  [name], M.D., F.A.C.S. Gastrointestinal and Minimally Invasive Surgery [facility] [address] [place], [place] [address] [phone] Main / Paging   03/17/2017    Subjective: (Chief complaint)  Mild soreness with twisting in bed.  Tylenol helps control. Walked in  hallways But mild dysphasia to Jell-O last night but tolerating clears this morning. Husband at bedside  Objective:  Vital signs:  Vitals:   03/16/17 1825 03/16/17 2000 03/17/17 0054 03/17/17 0604  BP: 134/83 (!) 150/86 140/80 118/78  Pulse: 87 99 93 94  Resp: 18 18 18 18   Temp: 97.6 F (36.4 C) 98 F (36.7 C) 97.7 F (36.5 C) 98 F (36.7 C)  TempSrc: Oral Oral Oral Oral  SpO2: 98% 96% 93% 96%  Weight:      Height:           Intake/Output   Yesterday:  01/30 0701 - 01/31 0700 In: 4300 [I.V.:4300] Out: 2375 [Urine:2100; Drains:175; Blood:100] This shift:  No intake/output data recorded.  Bowel function:  Flatus: YES  BM:  No  Drain: Serosanguinous   Physical Exam:  General: Pt awake/alert/oriented x4 in no acute distress Eyes: PERRL, normal EOM.  Sclera clear.  No icterus Neuro: CN II-XII intact w/o focal sensory/motor deficits. Lymph: No head/neck/groin lymphadenopathy Psych:  No delerium/psychosis/paranoia HENT: Normocephalic, Mucus membranes moist.  No thrush Neck: Supple, No tracheal deviation Chest: No chest wall pain w good excursion CV:  Pulses intact.  Regular rhythm MS: Normal AROM mjr joints.  No obvious deformity  Abdomen: Soft.  Nondistended.  Mildly tender at incisions only.  No evidence of peritonitis.  No incarcerated hernias.  Ext:  No deformity.  No mjr edema.  No cyanosis Skin: No petechiae / purpura  Results:   Labs: No results found for this or any previous visit (from the past 48 hour(s)).  Imaging / Studies: No results found.  Medications / Allergies: per chart  Antibiotics: Anti-infectives (From admission, onward)   Start  Dose/Rate Route Frequency Ordered Stop   03/16/17 0706  cefTRIAXone (ROCEPHIN) 2 g in dextrose 5 % 50 mL IVPB     2 g 100 mL/hr over 30 Minutes Intravenous On call to O.R. 03/16/17 4142 03/16/17 0834   03/16/17 0706  metroNIDAZOLE (FLAGYL) IVPB 500 mg     500 mg 100 mL/hr over 60 Minutes  Intravenous On call to O.R. 03/16/17 0706 03/16/17 0845        Note: Portions of this report may have been transcribed using voice recognition software. Every effort was made to ensure accuracy; however, inadvertent computerized transcription errors may be present.   Any transcriptional errors that result from this process are unintentional.     [name], M.D., F.A.C.S. Gastrointestinal and Minimally Invasive Surgery [facility] [address], [place], [place] [address] [phone] Main / Paging   03/17/2017

## 2017-03-18 NOTE — Progress Notes (Signed)
Discharge instructions given. Pt verbalized understanding and all questions were answered.  

## 2017-03-18 NOTE — Discharge Summary (Signed)
Physician Discharge Summary  Patient ID: Anna Jackson MRN: [id number] DOB/AGE: Aug 23, 1960  57 y.o.  Admit date: 03/16/2017 Discharge date: 03/18/2017   Patient Care Team: Carollee Herter, Alferd Apa, DO as PCP - General Fanny Skates, MD as Consulting Physician (General Surgery) Heath Lark, MD as Consulting Physician (Hematology and Oncology) Michael Boston, MD as Consulting Physician (General Surgery) Danis, Kirke Corin, MD as Consulting Physician (Gastroenterology) Ena Dawley, MD as Consulting Physician (Obstetrics and Gynecology) Garald Balding, MD as Consulting Physician (Orthopedic Surgery)  Discharge Diagnoses:  Principal Problem:   Recurrent hiatal hernia s/p robotic PEH repair/Nissen 03/16/2017 Active Problems:   Anxiety state   Essential hypertension   Asthma   GERD   Barrett's esophagus   Obesity (BMI 30-39.9)   Iron deficiency anemia   2 Days Post-Op  03/16/2017  POST-OPERATIVE DIAGNOSIS:   Recurrent Hiatal hernia with worsening heartburn, Cameron ulcers, Barrett's esophagus, intermittent dysphagia, intermittent chest pain.  SURGERY:  03/16/2017  Procedure(s): XI ROBOTIC LYSIS OF ADHESIONS 3 HOURS WITH REDO OF HIATAL HERNIA REPAIR, RESORBABLE MESH IMPLANT, NISSEN FUNDOPLICATION  SURGEON:    Surgeon(s): Michael Boston, MD Ileana Roup, MD  Consults: None  Hospital Course:   The patient underwent the surgery above.  Postoperatively, the patient gradually mobilized.  Esophagram done postoperative day 1 showing no leak or significant obstruction.  Esophagram done the morning of postoperative day 1 showed mild esophageal dilation but no obstruction.  Patient tolerating thin liquids well with only mild dysphasia.  Advanced to a dysphagia 1 pured diet.  Patient denied feeling much pain.  Narcotic usage minimal.  Walking well in the hallways.  Pain and other symptoms were treated aggressively.    By the time of discharge, the patient was walking  well the hallways, eating food, having flatus.  Pain was well-controlled on an oral medications.  Based on meeting discharge criteria and continuing to recover, I felt it was safe for the patient to be discharged from the hospital to further recover with close followup. Postoperative recommendations were discussed in detail.  They are written as well.  Discharged Condition: Good  Disposition:  Follow-up Information    Michael Boston, MD. Schedule an appointment as soon as possible for a visit in 3 weeks.   Specialty:  General Surgery Why:  To follow up after your operation, To follow up after your hospital stay Contact information: [place] [address] [phone]           01-Home or Self Care  Discharge Instructions    Call MD for:   Complete by:  As directed    Temperature > 101.72F   Call MD for:  extreme fatigue   Complete by:  As directed    Call MD for:  hives   Complete by:  As directed    Call MD for:  persistant nausea and vomiting   Complete by:  As directed    Call MD for:  redness, tenderness, or signs of infection (pain, swelling, redness, odor or green/yellow discharge around incision site)   Complete by:  As directed    Call MD for:  severe uncontrolled pain   Complete by:  As directed    Diet general   Complete by:  As directed    SEE ESOPHAGEAL SURGERY DIET INSTRUCTIONS  We using usually start you out on a pureed (blenderized) diet. Expect some sticking with swallowing over the next 1-2 months.   This is due to swelling  around your esophagus at the wrap & hiatal diaphragm repair.  It will gradually ease off over the next few months.   Discharge instructions   Complete by:  As directed    Please see discharge instruction sheets.   Also refer to any handouts/printouts that may have been given from the [facility] surgery office (if you visited [place] there before surgery) Please call our office if you have any questions or concerns [phone]   Driving Restrictions   Complete by:  As directed    No driving until off narcotics and can safely swerve away without pain during an emergency   Increase activity slowly   Complete by:  As directed    Lifting restrictions   Complete by:  As directed    Avoid heavy lifting initially, <20 pounds at first.   Do not push through pain.   You have no specific weight limit: If it hurts to do, DON'T DO IT.    If you feel no pain, you are not injuring anything.  Pain will protect you from injury.   Coughing and sneezing are far more stressful to your incision than any lifting.   Avoid resuming heavy lifting (>50 pounds) or other intense activity until off all narcotic pain medications.   When want to exercise more, give yourself 2 weeks to gradually get back to full intense exercise/activity.   May shower / Bathe   Complete by:  As directed    [place].  It is fine for dressings or wounds to be washed/rinsed.  Use gentle soap & water.  This will help the incisions and/or wounds get clean & minimize infection.   May walk up steps   Complete by:  As directed    Remove dressing in 72 hours   Complete by:  As directed    You have closed incisions: Shower and bathe over these incisions with soap and water every day.  It is OK to wash over the dressings: they are waterproof. Remove all surgical dressings on postoperative day #3.  You do not need to replace dressings over the closed incisions unless you feel more comfortable with a Band-Aid covering it.   Please call our office [phone] if you have further questions.   Sexual Activity Restrictions   Complete by:  As directed    Sexual activity as tolerated.  Do not push through pain.  Pain will protect you from injury.   Walk with assistance   Complete by:  As directed    Walk over an hour a day.  May use a walker/cane/companion to help with balance and stamina.      Allergies as of 03/18/2017      Reactions   Betadine  [povidone Iodine] Hives, Itching   Codeine    REACTION: SICK-NAUSEATED      Medication List    STOP taking these medications   doxycycline 100 MG tablet Commonly known as:  VIBRA-TABS   potassium chloride SA 20 MEQ tablet Commonly known as:  K-DUR,KLOR-CON     TAKE these medications   albuterol 108 (90 Base) MCG/ACT inhaler Commonly known as:  PROAIR HFA 2 puffs q6h prn What changed:    how much to take  how to take this  when to take this  reasons to take this  additional instructions   beclomethasone 40 MCG/ACT inhaler Commonly known as:  QVAR REDIHALER Inhale 2 puffs into the lungs 2 (two) times daily.   benzonatate 100 MG capsule  Commonly known as:  TESSALON Take 1 capsule (100 mg total) by mouth 3 (three) times daily as needed for cough.   docusate sodium 100 MG capsule Commonly known as:  COLACE Take 100 mg by mouth daily.   FLUoxetine 20 MG tablet Commonly known as:  PROZAC Take 1 tablet (20 mg total) by mouth daily.   fluticasone 50 MCG/ACT nasal spray Commonly known as:  FLONASE Place 2 sprays into both nostrils daily.   hydrochlorothiazide 25 MG tablet Commonly known as:  HYDRODIURIL Take 1 tablet (25 mg total) by mouth daily.   HYDROcodone-acetaminophen 5-325 MG tablet Commonly known as:  NORCO Take 1-2 tablets by mouth every 6 (six) hours as needed for moderate pain or severe pain.   ibuprofen 200 MG tablet Commonly known as:  ADVIL,MOTRIN Take 600 mg by mouth every 6 (six) hours as needed for mild pain.   omeprazole 20 MG tablet Commonly known as:  PRILOSEC OTC Take 1 tablet (20 mg total) by mouth daily as needed.   ondansetron 4 MG tablet Commonly known as:  ZOFRAN Take 1 tablet (4 mg total) by mouth every 8 (eight) hours as needed for nausea.   promethazine 25 MG suppository Commonly known as:  PHENERGAN Place 1 suppository (25 mg total) rectally every 6 (six) hours as needed for nausea.   valACYclovir 1000 MG tablet Commonly  known as:  VALTREX Take 1 tablet (1,000 mg total) by mouth 3 (three) times daily. What changed:    when to take this  additional instructions   vitamin B-12 1000 MCG tablet Commonly known as:  CYANOCOBALAMIN Take 1,000 mcg by mouth daily.   Vitamin D 2000 units tablet Take 2,000 Units by mouth daily.       Significant Diagnostic Studies:  No results found for this or any previous visit (from the past 72 hour(s)).  Dg Esophagus W/water Sol Cm  Result Date: 03/17/2017 CLINICAL DATA:  Postoperative for Nissen fundoplication and hiatal hernia repair. EXAM: ESOPHOGRAM/BARIUM SWALLOW TECHNIQUE: Single contrast examination was performed using  Isovue 300. FLUOROSCOPY TIME:  Fluoroscopy Time:  1.8 minutes Radiation Exposure Index (if provided by the fluoroscopic device): 27.5 mGy Number of Acquired Spot Images: 0 COMPARISON:  PET-CT dated 08/31/2013. FINDINGS: A drain is in place adjacent to the distal esophagus. The esophagus is dilated in its distal 7 cm, measuring up to 4.7 cm in diameter. This tapers smoothly in the vicinity of the gastroesophageal junction and fundoplication to a narrow channel with a maximum of about 5 mm in diameter through which contrast slowly percolated. This narrowed region is about 5 cm in length, beyond which contrast spills into the stomach cavity. Primary peristaltic waves were disrupted in the esophagus. No leak is observed. IMPRESSION: 1. Dilated terminal 7 cm of the esophagus extending to the presumed fundoplication site where there is a 5 cm in length area of smooth narrowing. Contrast slowly percolated through this narrowing, with maximum luminal diameter about 0.5 cc during the observed session. 2. Primary peristaltic waves were disrupted although this may be a postoperative phenomenon. A drain is in place projecting near the distal esophagus. Electronically Signed   By: Van Clines M.D.   On: 03/17/2017 09:24    Discharge Exam: Blood pressure (!)  159/84, pulse 78, temperature 97.6 F (36.4 C), temperature source Oral, resp. rate 18, height 5\' 8"  (1.727 m), weight 117.9 kg (260 lb), SpO2 94 %.  General: Pt awake/alert/oriented x4 in No acute distress Eyes: PERRL, normal EOM.  Sclera  clear.  No icterus Neuro: CN II-XII intact w/o focal sensory/motor deficits. Lymph: No head/neck/groin lymphadenopathy Psych:  No delerium/psychosis/paranoia HENT: Normocephalic, Mucus membranes moist.  No thrush Neck: Supple, No tracheal deviation Chest:  No chest wall pain w good excursion CV:  Pulses intact.  Regular rhythm MS: Normal AROM mjr joints.  No obvious deformity Abdomen: Soft.  Nondistended.  Nontender.  No evidence of peritonitis.  No incarcerated hernias.  Right lower quadrant 19 [place] Blake drain with serosanguineous fluid within it.  Dressing clean. Ext:  SCDs BLE.  No mjr edema.  No cyanosis Skin: No petechiae / purpura  Past Medical History:  Diagnosis Date  . Adenopathy    left axillary  . Anemia   . Anxiety   . Anxiety state, unspecified   . Asthma    no problem with asthma in several years  . Barrett esophagus   . Depression   . Esophageal reflux   . Headache 05/16/2013  . Herpes simplex without mention of complication   . Hyperlipidemia 03/12/2014  . Hypertension   . 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 Mental disorder   . Neuropathic pain 04/04/2014  . Non Hodgkin's lymphoma ([place]) 2015  . Skin infection 11/02/2013  . Unspecified asthma(493.90)   . Unspecified essential hypertension   . URI (upper respiratory infection) 01/15/2014    Past Surgical History:  Procedure Laterality Date  . AXILLARY LYMPH NODE DISSECTION Left 03/09/2013   Procedure: EXCISION LEFT AXILLARY LYMPH NODES;  Surgeon: [name], MD;  Location: [facility];  Service: General;  Laterality: Left;  . BREAST BIOPSY Left 02/13/2013   high risk  . BREAST EXCISIONAL BIOPSY Left 03/09/2013   high risk  . CERVICAL CONIZATION W/BX    . CHOLECYSTECTOMY   1995   Open, done with Hiatal hernia repair  . ESOPHAGEAL MANOMETRY N/A 01/12/2017   Procedure: ESOPHAGEAL MANOMETRY (EM);  Surgeon: Doran Stabler, MD;  Location: [facility];  Service: Gastroenterology;  Laterality: N/A;  . INCISIONAL HERNIA REPAIR  2004   Dr Nedra Hai.  Parietex 20x15cm mesh  . KNEE SURGERY Right 02/2010   Dr Shellia Carwin, Arthroscopic  . NISSEN FUNDOPLICATION  4315   OPEN, Dr Lennie Hummer  . PORTACATH PLACEMENT N/A 03/23/2013   Procedure: INSERTION PORT-A-CATH;  Surgeon: [name], MD;  Location: [facility];  Service: General;  Laterality: N/A;  . TUBAL LIGATION    . VAGINAL HYSTERECTOMY  2002   Dr Art Molena History   Socioeconomic History  . Marital status: Single    Spouse name: Not on file  . Number of children: 1  . Years of education: Not on file  . Highest education level: Not on file  Social Needs  . Financial resource strain: Not on file  . Food insecurity - worry: Not on file  . Food insecurity - inability: Not on file  . Transportation needs - medical: Not on file  . Transportation needs - non-medical: Not on file  Occupational History  . Occupation: RECEIVING Armed forces operational officer: [employer]  Tobacco Use  . Smoking status: Never Smoker  . Smokeless tobacco: Never Used  Substance and Sexual Activity  . Alcohol use: Yes    Comment: occasional  . Drug use: No  . Sexual activity: Not Currently    Partners: Male  Other Topics Concern  . Not on file  Social History Narrative   Exercise--  treadmill    Family History  Problem  Relation Age of Onset  . Heart disease Mother   . Coronary artery disease Unknown        with hernia repair  . Migraines Unknown   . Hypertension Unknown   . Breast cancer Maternal Grandmother 90  . Cancer Maternal Grandmother 50       breast  . Heart disease Brother   . Colon cancer Neg Hx     Current Facility-Administered Medications  Medication Dose Route Frequency Provider Last Rate Last  Dose  . 0.9 %  sodium chloride infusion  250 mL Intravenous PRN Michael Boston, MD      . acetaminophen (TYLENOL) tablet 1,000 mg  1,000 mg Oral Tor Netters, MD   1,000 mg at 03/18/17 0517  . alum & mag hydroxide-simeth (MAALOX/MYLANTA) 200-200-20 MG/5ML suspension 30 mL  30 mL Oral Q6H PRN Michael Boston, MD      . benzonatate (TESSALON) capsule 100 mg  100 mg Oral TID PRN Michael Boston, MD      . bisacodyl (DULCOLAX) suppository 10 mg  10 mg Rectal Daily PRN Michael Boston, MD      . budesonide (PULMICORT) nebulizer solution 0.5 mg  0.5 mg Nebulization BID Michael Boston, MD      . dexamethasone (DECADRON) injection 4 mg  4 mg Intravenous Gorden Harms, MD   4 mg at 03/17/17 2219  . diphenhydrAMINE (BENADRYL) 12.5 MG/5ML elixir 12.5 mg  12.5 mg Oral Q6H PRN Michael Boston, MD       Or  . diphenhydrAMINE (BENADRYL) injection 12.5 mg  12.5 mg Intravenous Q6H PRN Michael Boston, MD      . enoxaparin (LOVENOX) injection 40 mg  40 mg Subcutaneous Q24H Michael Boston, MD   40 mg at 03/17/17 3716  . fluticasone (FLONASE) 50 MCG/ACT nasal spray 2 spray  2 spray Each Nare Daily Michael Boston, MD      . guaiFENesin-dextromethorphan (ROBITUSSIN DM) 100-10 MG/5ML syrup 10 mL  10 mL Oral Q4H PRN Michael Boston, MD      . hydrALAZINE (APRESOLINE) injection 5-20 mg  5-20 mg Intravenous Q4H PRN Nyoka Cowden, Terri L, RPH      . hydrocortisone (ANUSOL-HC) 2.5 % rectal cream 1 application  1 application Topical QID PRN Michael Boston, MD      . hydrocortisone cream 1 % 1 application  1 application Topical TID PRN Michael Boston, MD      . HYDROmorphone (DILAUDID) injection 0.5-2 mg  0.5-2 mg Intravenous Q2H PRN Michael Boston, MD   1 mg at 03/17/17 0935  . lactated ringers infusion 1,000 mL  1,000 mL Intravenous Q8H PRN Michael Boston, MD      . lip balm (CARMEX) ointment 1 application  1 application Topical BID Michael Boston, MD   1 application at 96/78/93 2200  . magic mouthwash  15 mL Oral QID PRN Michael Boston, MD       . menthol-cetylpyridinium (CEPACOL) lozenge 3 mg  1 lozenge Oral PRN Nyoka Cowden, Terri L, RPH       Or  . phenol (CHLORASEPTIC) mouth spray 1-2 spray  1-2 spray Mouth/Throat PRN Nyoka Cowden, Terri L, RPH      . methocarbamol (ROBAXIN) tablet 750 mg  750 mg Oral Q6H PRN Michael Boston, MD      . metoprolol tartrate (LOPRESSOR) injection 5 mg  5 mg Intravenous Q6H PRN Nyoka Cowden, Terri L, RPH      . ondansetron (ZOFRAN-ODT) disintegrating tablet 4 mg  4 mg Oral Q6H PRN Michael Boston, MD  Or  . ondansetron (ZOFRAN) injection 4 mg  4 mg Intravenous Q6H PRN Michael Boston, MD      . oxyCODONE (Oxy IR/ROXICODONE) immediate release tablet 5-10 mg  5-10 mg Oral Q4H PRN Michael Boston, MD      . pantoprazole (PROTONIX) EC tablet 40 mg  40 mg Oral BID Rogelia Mire, MD   40 mg at 03/17/17 1647  . polyethylene glycol (MIRALAX / GLYCOLAX) packet 17 g  17 g Oral Daily PRN Michael Boston, MD      . prochlorperazine (COMPAZINE) tablet 10 mg  10 mg Oral Q6H PRN Michael Boston, MD       Or  . prochlorperazine (COMPAZINE) injection 5-10 mg  5-10 mg Intravenous Q6H PRN Michael Boston, MD      . simethicone (MYLICON) chewable tablet 40 mg  40 mg Oral Q6H PRN Michael Boston, MD      . sodium chloride flush (NS) 0.9 % injection 3 mL  3 mL Intravenous Gorden Harms, MD   3 mL at 03/17/17 2200  . sodium chloride flush (NS) 0.9 % injection 3 mL  3 mL Intravenous PRN Michael Boston, MD      . zolpidem (AMBIEN) tablet 5 mg  5 mg Oral QHS PRN Michael Boston, MD         Allergies  Allergen Reactions  . Betadine [Povidone Iodine] Hives and Itching  . Codeine     REACTION: SICK-NAUSEATED    Signed: Morton Peters, M.D., F.A.C.S. Gastrointestinal and Minimally Invasive Surgery [facility] [address] [place], [place] [address] [phone] Main / Paging   03/18/2017, 7:54 AM

## 2017-03-22 ENCOUNTER — Other Ambulatory Visit: Payer: Self-pay | Admitting: *Deleted

## 2017-03-22 NOTE — Patient Outreach (Addendum)
[facility]) Care Management  03/22/2017  Anna Jackson 06-26-1960 [id number]   Subjective: Telephone call to patient's home  / mobile number, no answer, left HIPAA compliant voicemail message, and requested call back.    Objective: Per KPN (Knowledge Performance Now, point of care tool), [employer] iCollaborate, and chart review, patient hospitalized  03/16/17 -03/18/17 for  Recurrent hiatal hernia.  Status post ROBOTIC LYSIS OF ADHESIONS 3 HOURS, Robotic reduction of paraesophageal hiatal hernia, Type II mediastinal dissection, Primary repair of recurrent hiatal hernia over pledgets with RESORBABLE Phasix mesh reinforcement, Anterior & posterior gastropexy, and Nissen fundoplication 2 cm over a 56-French bougie.    Patient has a history of open hiatal hernia repair and fundoplication, Cholecystectomy in 1995.   Also has a history of hypertension, asthma, Barrett's esophagus, and Iron deficiency anemia.       Assessment: Received [employer] Transition of care referral on 03/22/17.   Transition of care follow up pending patient contact.      Plan: RNCM will call patient for 2nd telephone outreach attempt, transition of care follow up, within 10 business days if no return call.     Konor Noren H. Annia Friendly, BSN, [place] [facility] Telephonic CM Phone: [phone] Fax: [phone]

## 2017-03-23 ENCOUNTER — Ambulatory Visit: Payer: Self-pay | Admitting: *Deleted

## 2017-03-24 ENCOUNTER — Ambulatory Visit: Payer: Self-pay | Admitting: *Deleted

## 2017-03-24 ENCOUNTER — Other Ambulatory Visit: Payer: Self-pay | Admitting: *Deleted

## 2017-03-24 ENCOUNTER — Other Ambulatory Visit: Payer: Self-pay | Admitting: Hematology and Oncology

## 2017-03-24 DIAGNOSIS — D509 Iron deficiency anemia, unspecified: Secondary | ICD-10-CM

## 2017-03-24 NOTE — Patient Outreach (Signed)
[facility]) Care Management  03/24/2017  ADANYA SOSINSKI 1960-10-26 [id number]   Subjective: Telephone call to patient's home  / mobile number, no answer, left HIPAA compliant voicemail message, and requested call back.    Objective: Per KPN (Knowledge Performance Now, point of care tool), [employer] iCollaborate, and chart review, patient hospitalized  03/16/17 -03/18/17 for Recurrent hiatal hernia.  Status post ROBOTIC LYSIS OF ADHESIONS 3 HOURS, Roboticreduction of paraesophageal hiatal hernia, Type II mediastinal dissection, Primary repair ofrecurrenthiatal hernia over pledgetswithRESORBABLEPhasix mesh reinforcement, Anterior & posterior gastropexy, and Nissen fundoplication 2 cm over a 56-French bougie.    Patient has a history of open hiatal hernia repair and fundoplication, Cholecystectomy in 1995.   Also has a history of hypertension, asthma, Barrett's esophagus, and Iron deficiency anemia.       Assessment: Received [employer] Transition of care referral on 03/22/17.   Transition of care follow up pending patient contact.      Plan: RNCM will call patient for 3rd telephone outreach attempt, transition of care follow up, within 10 business days if no return call.     Azam Gervasi H. Annia Friendly, BSN, [facility] Telephonic CM Phone: [phone] Fax: [phone]

## 2017-03-25 ENCOUNTER — Ambulatory Visit: Payer: Self-pay | Admitting: *Deleted

## 2017-03-25 ENCOUNTER — Encounter: Payer: Self-pay | Admitting: *Deleted

## 2017-03-25 ENCOUNTER — Telehealth: Payer: Self-pay | Admitting: Hematology and Oncology

## 2017-03-25 ENCOUNTER — Encounter: Payer: Self-pay | Admitting: Hematology and Oncology

## 2017-03-25 ENCOUNTER — Inpatient Hospital Stay (HOSPITAL_BASED_OUTPATIENT_CLINIC_OR_DEPARTMENT_OTHER): Payer: Managed Care, Other (non HMO) | Admitting: Hematology and Oncology

## 2017-03-25 ENCOUNTER — Other Ambulatory Visit: Payer: Self-pay | Admitting: *Deleted

## 2017-03-25 ENCOUNTER — Inpatient Hospital Stay: Payer: Managed Care, Other (non HMO) | Attending: Hematology and Oncology

## 2017-03-25 VITALS — BP 119/80 | HR 89 | Temp 97.9°F | Resp 18 | Ht 68.0 in | Wt 255.6 lb

## 2017-03-25 DIAGNOSIS — E538 Deficiency of other specified B group vitamins: Secondary | ICD-10-CM

## 2017-03-25 DIAGNOSIS — K2270 Barrett's esophagus without dysplasia: Secondary | ICD-10-CM

## 2017-03-25 DIAGNOSIS — Z8579 Personal history of other malignant neoplasms of lymphoid, hematopoietic and related tissues: Secondary | ICD-10-CM | POA: Diagnosis not present

## 2017-03-25 DIAGNOSIS — D509 Iron deficiency anemia, unspecified: Secondary | ICD-10-CM

## 2017-03-25 DIAGNOSIS — K219 Gastro-esophageal reflux disease without esophagitis: Secondary | ICD-10-CM

## 2017-03-25 DIAGNOSIS — D500 Iron deficiency anemia secondary to blood loss (chronic): Secondary | ICD-10-CM

## 2017-03-25 DIAGNOSIS — Z79899 Other long term (current) drug therapy: Secondary | ICD-10-CM | POA: Diagnosis not present

## 2017-03-25 LAB — CBC WITH DIFFERENTIAL/PLATELET
BASOS PCT: 0 %
Basophils Absolute: 0 10*3/uL (ref 0.0–0.1)
Eosinophils Absolute: 0.2 10*3/uL (ref 0.0–0.5)
Eosinophils Relative: 2 %
HCT: 33.3 % — ABNORMAL LOW (ref 34.8–46.6)
HEMOGLOBIN: 10.5 g/dL — AB (ref 11.6–15.9)
Lymphocytes Relative: 22 %
Lymphs Abs: 2.1 10*3/uL (ref 0.9–3.3)
MCH: 26.2 pg (ref 25.1–34.0)
MCHC: 31.5 g/dL (ref 31.5–36.0)
MCV: 83 fL (ref 79.5–101.0)
MONOS PCT: 7 %
Monocytes Absolute: 0.7 10*3/uL (ref 0.1–0.9)
NEUTROS PCT: 69 %
Neutro Abs: 6.4 10*3/uL (ref 1.5–6.5)
Platelets: 387 10*3/uL (ref 145–400)
RBC: 4.01 MIL/uL (ref 3.70–5.45)
RDW: 14.4 % (ref 11.2–14.5)
WBC: 9.4 10*3/uL (ref 3.9–10.3)

## 2017-03-25 LAB — FERRITIN: FERRITIN: 91 ng/mL (ref 9–269)

## 2017-03-25 MED ORDER — ESOMEPRAZOLE MAGNESIUM 40 MG PO PACK: 40.0000 mg | PACK | Freq: Every day | ORAL | 12 refills | Status: DC

## 2017-03-25 NOTE — Progress Notes (Signed)
[facility] OFFICE PROGRESS NOTE  Patient Care Team: Carollee Herter, Alferd Apa, DO as PCP - General Fanny Skates, MD as Consulting Physician (General Surgery) Heath Lark, MD as Consulting Physician (Hematology and Oncology) Michael Boston, MD as Consulting Physician (General Surgery) Danis, Kirke Corin, MD as Consulting Physician (Gastroenterology) Ena Dawley, MD as Consulting Physician (Obstetrics and Gynecology) Garald Balding, MD as Consulting Physician (Orthopedic Surgery) Serena Colonel, RN as [place] [facility]  SUMMARY OF ONCOLOGIC HISTORY: Oncology History   Anaplastic Lymphoma, T-cell, 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)   Staging method: AJCC 7th Edition   Clinical: Stage II signed by Heath Lark, MD on 04/03/2013  3:31 PM   Summary: Stage II      History of lymphoma   02/07/2013 Imaging    [place] confirmed enlarged LN      03/09/2013 Procedure    LN biopsy confirmed T cell lymphoma      03/23/2013 Procedure    She has port placement      03/26/2013 Imaging    Echo showed normal ejection fraction      03/29/2013 Bone Marrow Biopsy    Bone marrow biopsy was negative      04/02/2013 Imaging    PET/CT scan showed localized, stage II disease      04/04/2013 - 07/20/2013 Chemotherapy    She has completed 6 cycles of CHOP plus etoposide chemotherapy.      06/01/2013 Imaging    PET CT scan show complete response to treatment      06/27/2013 Adverse Reaction    Dose of vincristine was reduced due to peripheral neuropathy.      08/31/2013 Imaging    Repeat PET scans show no evidence of disease.      06/10/2014 Imaging    CT chest showed no evidence of recurrence       INTERVAL HISTORY: Please see below for problem oriented charting. She returns for further follow-up She had recent surgery for hiatal hernia repair She complained of excessive fatigue She has lost a lot of weight due  to inability to eat solid diet She complained of nonproductive cough No new lymphadenopathy No recent infection  REVIEW OF SYSTEMS:   Constitutional: Denies fevers, chills  Eyes: Denies blurriness of vision Ears, nose, mouth, throat, and face: Denies mucositis or sore throat Cardiovascular: Denies palpitation, chest discomfort or lower extremity swelling Gastrointestinal:  Denies nausea, heartburn or change in bowel habits Skin: Denies abnormal skin rashes Lymphatics: Denies new lymphadenopathy or easy bruising Neurological:Denies numbness, tingling or new weaknesses Behavioral/Psych: Mood is stable, no new changes  All other systems were reviewed with the patient and are negative.  I have reviewed the past medical history, past surgical history, social history and family history with the patient and they are unchanged from previous note.  ALLERGIES:  is allergic to betadine [povidone iodine] and codeine.  MEDICATIONS:  Current Outpatient Medications  Medication Sig Dispense Refill  . albuterol (PROAIR HFA) 108 (90 Base) MCG/ACT inhaler 2 puffs q6h prn (Patient taking differently: Inhale 2 puffs into the lungs every 6 (six) hours as needed for shortness of breath. ) 1 Inhaler 2  . beclomethasone (QVAR REDIHALER) 40 MCG/ACT inhaler Inhale 2 puffs into the lungs 2 (two) times daily. (Patient not taking: Reported on 03/09/2017) 10.6 g 12  . Cholecalciferol (VITAMIN D) 2000 units tablet Take 2,000 Units by mouth daily.    Marland Kitchen esomeprazole (NEXIUM) 40  MG packet Take 40 mg by mouth daily before breakfast. 90 each 12  . FLUoxetine (PROZAC) 20 MG tablet Take 1 tablet (20 mg total) by mouth daily. 90 tablet 2  . hydrochlorothiazide (HYDRODIURIL) 25 MG tablet Take 1 tablet (25 mg total) by mouth daily. 90 tablet 2  . HYDROcodone-acetaminophen (NORCO) 5-325 MG tablet Take 1-2 tablets by mouth every 6 (six) hours as needed for moderate pain or severe pain. 30 tablet 0  . ibuprofen (ADVIL,MOTRIN) 200  MG tablet Take 600 mg by mouth every 6 (six) hours as needed for mild pain.    Marland Kitchen ondansetron (ZOFRAN) 4 MG tablet Take 1 tablet (4 mg total) by mouth every 8 (eight) hours as needed for nausea. 8 tablet 5  . promethazine (PHENERGAN) 25 MG suppository Place 1 suppository (25 mg total) rectally every 6 (six) hours as needed for nausea. 5 suppository 5  . valACYclovir (VALTREX) 1000 MG tablet Take 1 tablet (1,000 mg total) by mouth 3 (three) times daily. (Patient taking differently: Take 1,000 mg by mouth See admin instructions. Takes three times a day for breakouts) 90 tablet 1  . vitamin B-12 (CYANOCOBALAMIN) 1000 MCG tablet Take 1,000 mcg by mouth daily.     No current facility-administered medications for this visit.     PHYSICAL EXAMINATION: ECOG PERFORMANCE STATUS: 1 - Symptomatic but completely ambulatory  Vitals:   03/25/17 1456  BP: 119/80  Pulse: 89  Resp: 18  Temp: 97.9 F (36.6 C)  SpO2: 100%   Filed Weights   03/25/17 1456  Weight: 255 lb 9.6 oz (115.9 kg)    GENERAL:alert, no distress and comfortable SKIN: skin color, texture, turgor are normal, no rashes or significant lesions EYES: normal, Conjunctiva are pink and non-injected, sclera clear OROPHARYNX:no exudate, no erythema and lips, buccal mucosa, and tongue normal  NECK: supple, thyroid normal size, non-tender, without nodularity LYMPH:  no palpable lymphadenopathy in the cervical, axillary or inguinal LUNGS: clear to auscultation and percussion with normal breathing effort HEART: regular rate & rhythm and no murmurs and no lower extremity edema ABDOMEN:abdomen soft, non-tender and normal bowel sounds.  Well-healed surgical scar Musculoskeletal:no cyanosis of digits and no clubbing  NEURO: alert & oriented x 3 with fluent speech, no focal motor/sensory deficits  LABORATORY DATA:  I have reviewed the data as listed    Component Value Date/Time   NA 138 03/09/2017 0853   NA 141 11/18/2016 0754   K 3.3 (L)  03/09/2017 0853   K 4.0 11/18/2016 0754   CL 101 03/09/2017 0853   CO2 28 03/09/2017 0853   CO2 25 11/18/2016 0754   GLUCOSE 97 03/09/2017 0853   GLUCOSE 90 11/18/2016 0754   BUN 10 03/09/2017 0853   BUN 14.1 11/18/2016 0754   CREATININE 0.70 03/09/2017 0853   CREATININE 0.8 11/18/2016 0754   CALCIUM 9.0 03/09/2017 0853   CALCIUM 9.5 11/18/2016 0754   PROT 6.7 11/18/2016 0754   ALBUMIN 3.5 11/18/2016 0754   AST 14 11/18/2016 0754   ALT 19 11/18/2016 0754   ALKPHOS 143 11/18/2016 0754   BILITOT 0.46 11/18/2016 0754   GFRNONAA >60 03/09/2017 0853   GFRAA >60 03/09/2017 0853    No results found for: SPEP, UPEP  Lab Results  Component Value Date   WBC 9.4 03/25/2017   NEUTROABS 6.4 03/25/2017   HGB 10.5 (L) 03/25/2017   HCT 33.3 (L) 03/25/2017   MCV 83.0 03/25/2017   PLT 387 03/25/2017      Chemistry  Component Value Date/Time   NA 138 03/09/2017 0853   NA 141 11/18/2016 0754   K 3.3 (L) 03/09/2017 0853   K 4.0 11/18/2016 0754   CL 101 03/09/2017 0853   CO2 28 03/09/2017 0853   CO2 25 11/18/2016 0754   BUN 10 03/09/2017 0853   BUN 14.1 11/18/2016 0754   CREATININE 0.70 03/09/2017 0853   CREATININE 0.8 11/18/2016 0754      Component Value Date/Time   CALCIUM 9.0 03/09/2017 0853   CALCIUM 9.5 11/18/2016 0754   ALKPHOS 143 11/18/2016 0754   AST 14 11/18/2016 0754   ALT 19 11/18/2016 0754   BILITOT 0.46 11/18/2016 0754       RADIOGRAPHIC STUDIES: I have personally reviewed the radiological images as listed and agreed with the findings in the report. Nm Gastric Emptying  Result Date: 03/02/2017 CLINICAL DATA:  Abdominal pain with reported acid reflux. EXAM: NUCLEAR MEDICINE GASTRIC EMPTYING SCAN TECHNIQUE: After oral ingestion of radiolabeled meal, sequential abdominal images were obtained for 120 minutes. Residual percentage of activity remaining within the stomach was calculated at 60 and 120 minutes. RADIOPHARMACEUTICALS:  2.1 mCi Tc-19msulfur colloid  in standardized meal including egg COMPARISON:  None. FINDINGS: Expected location of the stomach in the left upper quadrant. Ingested meal empties the stomach gradually over the course of the study with 64.4% retention at 60 min and 24.5% retention at 120 min (normal retention less than 30% at a 120 min). IMPRESSION: Normal gastric emptying study. Electronically Signed   By: WLowella GripIII M.D.   On: 03/02/2017 14:55   Dg Esophagus W/water Sol Cm  Result Date: 03/17/2017 CLINICAL DATA:  Postoperative for Nissen fundoplication and hiatal hernia repair. EXAM: ESOPHOGRAM/BARIUM SWALLOW TECHNIQUE: Single contrast examination was performed using  Isovue 300. FLUOROSCOPY TIME:  Fluoroscopy Time:  1.8 minutes Radiation Exposure Index (if provided by the fluoroscopic device): 27.5 mGy Number of Acquired Spot Images: 0 COMPARISON:  PET-CT dated 08/31/2013. FINDINGS: A drain is in place adjacent to the distal esophagus. The esophagus is dilated in its distal 7 cm, measuring up to 4.7 cm in diameter. This tapers smoothly in the vicinity of the gastroesophageal junction and fundoplication to a narrow channel with a maximum of about 5 mm in diameter through which contrast slowly percolated. This narrowed region is about 5 cm in length, beyond which contrast spills into the stomach cavity. Primary peristaltic waves were disrupted in the esophagus. No leak is observed. IMPRESSION: 1. Dilated terminal 7 cm of the esophagus extending to the presumed fundoplication site where there is a 5 cm in length area of smooth narrowing. Contrast slowly percolated through this narrowing, with maximum luminal diameter about 0.5 cc during the observed session. 2. Primary peristaltic waves were disrupted although this may be a postoperative phenomenon. A drain is in place projecting near the distal esophagus. Electronically Signed   By: WVan ClinesM.D.   On: 03/17/2017 09:24    ASSESSMENT & PLAN:  History of  lymphoma Examination is unremarkable for disease recurrence I do not feel strongly we need to pursue repeat CT imaging in the absence of symptoms  Iron deficiency anemia The patient have history of recurrent iron deficiency anemia due to GI bleed Serum iron is pending If serum ferritin is low, I will schedule IV iron infusion  Vitamin B12 deficiency Her recent surgery might put her at risk of B12 deficiency anemia given gastric resection I will check serum vitamin B12 in her next visit In the  meantime, she will continue high-dose B12 oral supplement  GERD She has chronic cough, likely exacerbated by recent surgery I recommend high-dose PPI in the morning and Zantac at night If her cough does not resolve, she would need to see her primary care doctor for possible evaluation of cough related to asthma Recommend elevation of the head of the bed when she sleeps at night to reduce the risk of reflux   No orders of the defined types were placed in this encounter.  All questions were answered. The patient knows to call the clinic with any problems, questions or concerns. No barriers to learning was detected. I spent 15 minutes counseling the patient face to face. The total time spent in the appointment was 20 minutes and more than 50% was on counseling and review of test results     Heath Lark, MD 03/25/2017 3:30 PM

## 2017-03-25 NOTE — Patient Outreach (Signed)
[facility]) Care Management  03/25/2017  Anna Jackson [birth year] [id number]    Subjective:Telephone call to patient's home / mobile number, no answer, left HIPAA compliant voicemail message, and requested call back.    Objective:Per KPN (Knowledge Performance Now, point of care tool), [employer] iCollaborate, and chart review, patient hospitalized1/30/19 -03/18/17 forRecurrent hiatal hernia. Status post ROBOTIC LYSIS OF ADHESIONS 3 HOURS,Roboticreduction of paraesophageal hiatal hernia,Type II mediastinal dissection,Primary repair ofrecurrenthiatal hernia over pledgetswithRESORBABLEPhasix mesh reinforcement,Anterior & posterior gastropexy, andNissen fundoplication 2 cm over a 56-French bougie. Patient has a history of open hiatal hernia repair and fundoplication,Cholecystectomyin 1995. Also has a history of hypertension, asthma, Barrett's esophagus, andIron deficiency anemia.     Assessment: Received [employer] Transition of care referral on 03/22/17.Transition of care follow up pending patient contact.     Plan:RNCM will send unsuccessful outreach  letter, [facility] pamphlet, and proceed with case closure, within 10 business days if no return call.     Kylyn Mcdade H. Annia Friendly, BSN, [place] [facility] Telephonic CM Phone: [phone] Fax: [phone]

## 2017-03-25 NOTE — Telephone Encounter (Signed)
Mailed patient calendar of upcoming October appointments per 2/8 los.

## 2017-03-25 NOTE — Assessment & Plan Note (Signed)
Her recent surgery might put her at risk of B12 deficiency anemia given gastric resection I will check serum vitamin B12 in her next visit In the meantime, she will continue high-dose B12 oral supplement

## 2017-03-25 NOTE — Assessment & Plan Note (Signed)
Examination is unremarkable for disease recurrence I do not feel strongly we need to pursue repeat CT imaging in the absence of symptoms

## 2017-03-25 NOTE — Assessment & Plan Note (Signed)
She has chronic cough, likely exacerbated by recent surgery I recommend high-dose PPI in the morning and Zantac at night If her cough does not resolve, she would need to see her primary care doctor for possible evaluation of cough related to asthma Recommend elevation of the head of the bed when she sleeps at night to reduce the risk of reflux

## 2017-03-25 NOTE — Assessment & Plan Note (Signed)
The patient have history of recurrent iron deficiency anemia due to GI bleed Serum iron is pending If serum ferritin is low, I will schedule IV iron infusion

## 2017-03-28 ENCOUNTER — Encounter: Payer: Self-pay | Admitting: Family Medicine

## 2017-03-28 ENCOUNTER — Ambulatory Visit: Payer: Managed Care, Other (non HMO) | Admitting: Family Medicine

## 2017-03-28 VITALS — BP 120/80 | HR 83 | Temp 98.0°F | Resp 16 | Ht 68.0 in | Wt 257.8 lb

## 2017-03-28 DIAGNOSIS — J40 Bronchitis, not specified as acute or chronic: Secondary | ICD-10-CM

## 2017-03-28 DIAGNOSIS — J4541 Moderate persistent asthma with (acute) exacerbation: Secondary | ICD-10-CM

## 2017-03-28 MED ORDER — PROMETHAZINE-DM 6.25-15 MG/5ML PO SYRP: 5.0000 mL | ORAL_SOLUTION | Freq: Four times a day (QID) | ORAL | 0 refills | Status: DC | PRN

## 2017-03-28 MED ORDER — AZITHROMYCIN 250 MG PO TABS: ORAL_TABLET | ORAL | 0 refills | Status: DC

## 2017-03-28 MED ORDER — ALBUTEROL SULFATE HFA 108 (90 BASE) MCG/ACT IN AERS: INHALATION_SPRAY | RESPIRATORY_TRACT | 2 refills | Status: DC

## 2017-03-28 MED ORDER — BECLOMETHASONE DIPROP HFA 40 MCG/ACT IN AERB: 2.0000 | INHALATION_SPRAY | Freq: Two times a day (BID) | RESPIRATORY_TRACT | 12 refills | Status: DC

## 2017-03-28 NOTE — Patient Instructions (Signed)

## 2017-03-28 NOTE — Progress Notes (Signed)
Patient ID: JAELIANA LOCOCO, female   DOB: 09/01/1960, 57 y.o.   MRN: [id number]    Subjective:  I acted as a Education administrator for Dr. Carollee Herter.  [name], [place]   Patient ID: VIKKIE GOEDEN, female    DOB: 1961-02-15, 57 y.o.   MRN: [id number]  Chief Complaint  Patient presents with  . Cough    Cough  This is a new problem. Episode onset: 1 week and a half. The cough is non-productive. Associated symptoms include rhinorrhea and shortness of breath. Pertinent negatives include no chest pain, chills, ear congestion, ear pain, fever, headaches, nasal congestion, postnasal drip, rash or wheezing. Treatments tried: albuterol. Her past medical history is significant for asthma.    Patient is in today for cough.  She thinks it is her asthma.  Patient Care Team: Carollee Herter, Alferd Apa, DO as PCP - General Fanny Skates, MD as Consulting Physician (General Surgery) Heath Lark, MD as Consulting Physician (Hematology and Oncology) Michael Boston, MD as Consulting Physician (General Surgery) Danis, Kirke Corin, MD as Consulting Physician (Gastroenterology) Ena Dawley, MD as Consulting Physician (Obstetrics and Gynecology) Garald Balding, MD as Consulting Physician (Orthopedic Surgery) Serena Colonel, RN as [facility]   Past Medical History:  Diagnosis Date  . Adenopathy    left axillary  . Anemia   . Anxiety   . Anxiety state, unspecified   . Asthma    no problem with asthma in several years  . Barrett esophagus   . Depression   . Esophageal reflux   . Headache 05/16/2013  . Herpes simplex without mention of complication   . Hyperlipidemia 03/12/2014  . Hypertension   . 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 Mental disorder   . Neuropathic pain 04/04/2014  . Non Hodgkin's lymphoma ([place]) 2015  . Skin infection 11/02/2013  . Unspecified asthma(493.90)   . Unspecified essential hypertension   . URI (upper respiratory infection) 01/15/2014    Past  Surgical History:  Procedure Laterality Date  . AXILLARY LYMPH NODE DISSECTION Left 03/09/2013   Procedure: EXCISION LEFT AXILLARY LYMPH NODES;  Surgeon: [name], MD;  Location: [facility];  Service: General;  Laterality: Left;  . BREAST BIOPSY Left 02/13/2013   high risk  . BREAST EXCISIONAL BIOPSY Left 03/09/2013   high risk  . CERVICAL CONIZATION W/BX    . CHOLECYSTECTOMY  1995   Open, done with Hiatal hernia repair  . ESOPHAGEAL MANOMETRY N/A 01/12/2017   Procedure: ESOPHAGEAL MANOMETRY (EM);  Surgeon: Doran Stabler, MD;  Location: [facility];  Service: Gastroenterology;  Laterality: N/A;  . INCISIONAL HERNIA REPAIR  2004   Dr Nedra Hai.  Parietex 20x15cm mesh  . KNEE SURGERY Right 02/2010   Dr Shellia Carwin, Arthroscopic  . NISSEN FUNDOPLICATION  [id number]   OPEN, Dr Lennie Hummer  . PORTACATH PLACEMENT N/A 03/23/2013   Procedure: INSERTION PORT-A-CATH;  Surgeon: [name], MD;  Location: [facility];  Service: General;  Laterality: N/A;  . TUBAL LIGATION    . VAGINAL HYSTERECTOMY  2002   Dr Art Raphael Gibney    Family History  Problem Relation Age of Onset  . Heart disease Mother   . Coronary artery disease Unknown        with hernia repair  . Migraines Unknown   . Hypertension Unknown   . Breast cancer Maternal Grandmother 41  . Cancer Maternal Grandmother 50       breast  . Heart disease Brother   .  Colon cancer Neg Hx     Social History   Socioeconomic History  . Marital status: Single    Spouse name: Not on file  . Number of children: 1  . Years of education: Not on file  . Highest education level: Not on file  Social Needs  . Financial resource strain: Not on file  . Food insecurity - worry: Not on file  . Food insecurity - inability: Not on file  . Transportation needs - medical: Not on file  . Transportation needs - non-medical: Not on file  Occupational History  . Occupation: RECEIVING Armed forces operational officer: [employer]  Tobacco Use  . Smoking status:  Never Smoker  . Smokeless tobacco: Never Used  Substance and Sexual Activity  . Alcohol use: Yes    Comment: occasional  . Drug use: No  . Sexual activity: Not Currently    Partners: Male  Other Topics Concern  . Not on file  Social History Narrative   Exercise--  treadmill    Outpatient Medications Prior to Visit  Medication Sig Dispense Refill  . Cholecalciferol (VITAMIN D) 2000 units tablet Take 2,000 Units by mouth daily.    Marland Kitchen esomeprazole (NEXIUM) 40 MG packet Take 40 mg by mouth daily before breakfast. 90 each 12  . FLUoxetine (PROZAC) 20 MG tablet Take 1 tablet (20 mg total) by mouth daily. 90 tablet 2  . hydrochlorothiazide (HYDRODIURIL) 25 MG tablet Take 1 tablet (25 mg total) by mouth daily. 90 tablet 2  . HYDROcodone-acetaminophen (NORCO) 5-325 MG tablet Take 1-2 tablets by mouth every 6 (six) hours as needed for moderate pain or severe pain. 30 tablet 0  . ibuprofen (ADVIL,MOTRIN) 200 MG tablet Take 600 mg by mouth every 6 (six) hours as needed for mild pain.    Marland Kitchen ondansetron (ZOFRAN) 4 MG tablet Take 1 tablet (4 mg total) by mouth every 8 (eight) hours as needed for nausea. 8 tablet 5  . promethazine (PHENERGAN) 25 MG suppository Place 1 suppository (25 mg total) rectally every 6 (six) hours as needed for nausea. 5 suppository 5  . valACYclovir (VALTREX) 1000 MG tablet Take 1 tablet (1,000 mg total) by mouth 3 (three) times daily. (Patient taking differently: Take 1,000 mg by mouth See admin instructions. Takes three times a day for breakouts) 90 tablet 1  . vitamin B-12 (CYANOCOBALAMIN) 1000 MCG tablet Take 1,000 mcg by mouth daily.    Marland Kitchen albuterol (PROAIR HFA) 108 (90 Base) MCG/ACT inhaler 2 puffs q6h prn (Patient taking differently: Inhale 2 puffs into the lungs every 6 (six) hours as needed for shortness of breath. ) 1 Inhaler 2  . beclomethasone (QVAR REDIHALER) 40 MCG/ACT inhaler Inhale 2 puffs into the lungs 2 (two) times daily. (Patient not taking: Reported on  03/09/2017) 10.6 g 12   No facility-administered medications prior to visit.     Allergies  Allergen Reactions  . Betadine [Povidone Iodine] Hives and Itching  . Codeine     REACTION: SICK-NAUSEATED    Review of Systems  Constitutional: Negative for chills, fever and malaise/fatigue.  HENT: Positive for rhinorrhea. Negative for congestion, ear pain and postnasal drip.   Eyes: Negative for blurred vision.  Respiratory: Positive for cough and shortness of breath. Negative for wheezing.   Cardiovascular: Negative for chest pain, palpitations and leg swelling.  Gastrointestinal: Negative for vomiting.  Musculoskeletal: Negative for back pain.  Skin: Negative for rash.  Neurological: Negative for loss of consciousness and headaches.  Objective:    Physical Exam  Constitutional: She is oriented to person, place, and time. She appears well-developed and well-nourished.  HENT:  Right Ear: External ear normal.  Left Ear: External ear normal.  + PND + errythema  Eyes: Conjunctivae are normal. Right eye exhibits no discharge. Left eye exhibits no discharge.  Cardiovascular: Normal rate, regular rhythm and normal heart sounds.  No murmur heard. Pulmonary/Chest: Effort normal. No respiratory distress. She has decreased breath sounds. She has no wheezes. She has rhonchi. She has no rales. She exhibits no tenderness.  Musculoskeletal: She exhibits no edema.  Lymphadenopathy:    She has no cervical adenopathy.  Neurological: She is alert and oriented to person, place, and time.  Nursing note and vitals reviewed.   BP 120/80 (BP Location: Left Arm, Cuff Size: Large)   Pulse 83   Temp 98 F (36.7 C) (Oral)   Resp 16   Ht '5\' 8"'  (1.727 m)   Wt 257 lb 12.8 oz (116.9 kg)   SpO2 98%   BMI 39.20 kg/m  Wt Readings from Last 3 Encounters:  03/28/17 257 lb 12.8 oz (116.9 kg)  03/25/17 255 lb 9.6 oz (115.9 kg)  03/16/17 260 lb (117.9 kg)   BP Readings from Last 3 Encounters:    03/28/17 120/80  03/25/17 119/80  03/18/17 (!) 159/84     Immunization History  Administered Date(s) Administered  . Influenza Split 11/23/2010  . Influenza Whole 11/21/2007, 12/22/2009, 11/16/2011, 11/09/2012  . Influenza, Seasonal, Injecte, Preservative Fre 12/11/2014  . Influenza,inj,Quad PF,6+ Mos 12/10/2013  . Influenza-Unspecified 12/10/2015, 12/09/2016  . Pneumococcal Conjugate-13 12/10/2013  . Pneumococcal Polysaccharide-23 02/07/2014  . Td 05/09/2001  . Tdap 11/23/2010    Health Maintenance  Topic Date Due  . Hepatitis C Screening  1960/08/13  . HIV Screening  11/14/1977  . PAP SMEAR  03/28/2015  . MAMMOGRAM  01/18/2017  . TETANUS/TDAP  11/22/2020  . COLONOSCOPY  11/12/2026  . INFLUENZA VACCINE  Completed    Lab Results  Component Value Date   WBC 9.4 03/25/2017   HGB 10.5 (L) 03/25/2017   HCT 33.3 (L) 03/25/2017   PLT 387 03/25/2017   GLUCOSE 97 03/09/2017   CHOL 178 02/17/2016   TRIG 143.0 02/17/2016   HDL 63.80 02/17/2016   LDLDIRECT 105.0 11/23/2010   LDLCALC 85 02/17/2016   ALT 19 11/18/2016   AST 14 11/18/2016   NA 138 03/09/2017   K 3.3 (L) 03/09/2017   CL 101 03/09/2017   CREATININE 0.70 03/09/2017   BUN 10 03/09/2017   CO2 28 03/09/2017   TSH 1.05 01/13/2016   INR 0.99 03/29/2013    Lab Results  Component Value Date   TSH 1.05 01/13/2016   Lab Results  Component Value Date   WBC 9.4 03/25/2017   HGB 10.5 (L) 03/25/2017   HCT 33.3 (L) 03/25/2017   MCV 83.0 03/25/2017   PLT 387 03/25/2017   Lab Results  Component Value Date   NA 138 03/09/2017   K 3.3 (L) 03/09/2017   CHLORIDE 106 11/18/2016   CO2 28 03/09/2017   GLUCOSE 97 03/09/2017   BUN 10 03/09/2017   CREATININE 0.70 03/09/2017   BILITOT 0.46 11/18/2016   ALKPHOS 143 11/18/2016   AST 14 11/18/2016   ALT 19 11/18/2016   PROT 6.7 11/18/2016   ALBUMIN 3.5 11/18/2016   CALCIUM 9.0 03/09/2017   ANIONGAP 9 03/09/2017   EGFR 89 (L) 11/18/2016   GFR 85.83 02/17/2016    Lab Results  Component Value Date   CHOL 178 02/17/2016   Lab Results  Component Value Date   HDL 63.80 02/17/2016   Lab Results  Component Value Date   LDLCALC 85 02/17/2016   Lab Results  Component Value Date   TRIG 143.0 02/17/2016   Lab Results  Component Value Date   CHOLHDL 3 02/17/2016   No results found for: HGBA1C       Assessment & Plan:   Problem List Items Addressed This Visit      Unprioritized   Asthma   Relevant Medications   beclomethasone (QVAR REDIHALER) 40 MCG/ACT inhaler   albuterol (PROAIR HFA) 108 (90 Base) MCG/ACT inhaler    Other Visit Diagnoses    Bronchitis    -  Primary   Relevant Medications   beclomethasone (QVAR REDIHALER) 40 MCG/ACT inhaler   azithromycin (ZITHROMAX Z-PAK) 250 MG tablet   albuterol (PROAIR HFA) 108 (90 Base) MCG/ACT inhaler   promethazine-dextromethorphan (PROMETHAZINE-DM) 6.25-15 MG/5ML syrup      I am having Zanyah C. Urschel start on azithromycin and promethazine-dextromethorphan. I am also having her maintain her vitamin B-12, Vitamin D, valACYclovir, hydrochlorothiazide, FLUoxetine, ibuprofen, promethazine, ondansetron, HYDROcodone-acetaminophen, esomeprazole, beclomethasone, and albuterol.  Meds ordered this encounter  Medications  . beclomethasone (QVAR REDIHALER) 40 MCG/ACT inhaler    Sig: Inhale 2 puffs into the lungs 2 (two) times daily.    Dispense:  10.6 g    Refill:  12  . azithromycin (ZITHROMAX Z-PAK) 250 MG tablet    Sig: As directed    Dispense:  6 each    Refill:  0  . albuterol (PROAIR HFA) 108 (90 Base) MCG/ACT inhaler    Sig: 2 puffs q6h prn    Dispense:  1 Inhaler    Refill:  2  . promethazine-dextromethorphan (PROMETHAZINE-DM) 6.25-15 MG/5ML syrup    Sig: Take 5 mLs by mouth 4 (four) times daily as needed.    Dispense:  118 mL    Refill:  0    CMA served as scribe during this visit. History, Physical and Plan performed by medical provider. Documentation and orders reviewed  and attested to.  Ann Held, DO

## 2017-03-29 ENCOUNTER — Other Ambulatory Visit: Payer: Self-pay | Admitting: *Deleted

## 2017-03-29 DIAGNOSIS — J4541 Moderate persistent asthma with (acute) exacerbation: Secondary | ICD-10-CM

## 2017-03-29 DIAGNOSIS — J40 Bronchitis, not specified as acute or chronic: Secondary | ICD-10-CM

## 2017-03-29 MED ORDER — BECLOMETHASONE DIPROP HFA 40 MCG/ACT IN AERB: 2.0000 | INHALATION_SPRAY | Freq: Two times a day (BID) | RESPIRATORY_TRACT | 12 refills | Status: DC

## 2017-04-08 ENCOUNTER — Other Ambulatory Visit: Payer: Self-pay | Admitting: *Deleted

## 2017-04-08 NOTE — Patient Outreach (Signed)
[facility]) Care Management  04/08/2017  Anna Jackson [birth year] [id number]   No response from patient outreach attempts will proceed with case closure.    Objective:Per KPN (Knowledge Performance Now, point of care tool), [employer] iCollaborate, and chart review, patient hospitalized1/30/19 -03/18/17 forRecurrent hiatal hernia. Status post ROBOTIC LYSIS OF ADHESIONS 3 HOURS,Roboticreduction of paraesophageal hiatal hernia,Type II mediastinal dissection,Primary repair ofrecurrenthiatal hernia over pledgetswithRESORBABLEPhasix mesh reinforcement,Anterior &posterior gastropexy, andNissen fundoplication 2 cm over a 56-French bougie. Patient has a history of open hiatal hernia repair and fundoplication,Cholecystectomyin 1995. Also has a history of hypertension, asthma, Barrett's esophagus, andIron deficiency anemia.     Assessment: Received [employer] Transition of care referral on 03/22/17.Transition of care follow up not completed due to unable to contact patient and will proceed with case closure.      Plan:RNCM will send case closure due to unable to reach request to Anna Jackson at [facility].     Anna Jackson H. Annia Friendly, BSN, [place] [facility] Telephonic CM Phone: [phone] Fax: [phone]

## 2017-05-13 ENCOUNTER — Ambulatory Visit (INDEPENDENT_AMBULATORY_CARE_PROVIDER_SITE_OTHER): Payer: Managed Care, Other (non HMO) | Admitting: Family Medicine

## 2017-05-13 ENCOUNTER — Encounter: Payer: Self-pay | Admitting: Family Medicine

## 2017-05-13 VITALS — BP 138/84 | HR 80 | Temp 98.3°F | Resp 16 | Ht 68.0 in | Wt 247.6 lb

## 2017-05-13 DIAGNOSIS — F419 Anxiety disorder, unspecified: Secondary | ICD-10-CM

## 2017-05-13 DIAGNOSIS — Z1239 Encounter for other screening for malignant neoplasm of breast: Secondary | ICD-10-CM

## 2017-05-13 DIAGNOSIS — I10 Essential (primary) hypertension: Secondary | ICD-10-CM

## 2017-05-13 DIAGNOSIS — E785 Hyperlipidemia, unspecified: Secondary | ICD-10-CM

## 2017-05-13 DIAGNOSIS — Z0000 Encounter for general adult medical examination without abnormal findings: Secondary | ICD-10-CM | POA: Diagnosis not present

## 2017-05-13 DIAGNOSIS — Z1231 Encounter for screening mammogram for malignant neoplasm of breast: Secondary | ICD-10-CM

## 2017-05-13 MED ORDER — FLUOXETINE HCL 40 MG PO CAPS
40.0000 mg | ORAL_CAPSULE | Freq: Every day | ORAL | 3 refills | Status: DC
Start: 2017-05-13 — End: 2019-04-27

## 2017-05-13 MED ORDER — ALPRAZOLAM 0.25 MG PO TABS: 0.2500 mg | ORAL_TABLET | Freq: Two times a day (BID) | ORAL | 0 refills | Status: DC | PRN

## 2017-05-13 NOTE — Patient Instructions (Signed)
Preventive Care 40-64 Years, Female Preventive care refers to lifestyle choices and visits with your health care provider that can promote health and wellness. What does preventive care include?  A yearly physical exam. This is also called an annual well check.  Dental exams once or twice a year.  Routine eye exams. Ask your health care provider how often you should have your eyes checked.  Personal lifestyle choices, including: ? Daily care of your teeth and gums. ? Regular physical activity. ? Eating a healthy diet. ? Avoiding tobacco and drug use. ? Limiting alcohol use. ? Practicing safe sex. ? Taking low-dose aspirin daily starting at age 55. ? Taking vitamin and mineral supplements as recommended by your health care provider. What happens during an annual well check? The services and screenings done by your health care provider during your annual well check will depend on your age, overall health, lifestyle risk factors, and family history of disease. Counseling Your health care provider may ask you questions about your:  Alcohol use.  Tobacco use.  Drug use.  Emotional well-being.  Home and relationship well-being.  Sexual activity.  Eating habits.  Work and work Statistician.  Method of birth control.  Menstrual cycle.  Pregnancy history.  Screening You may have the following tests or measurements:  Height, weight, and BMI.  Blood pressure.  Lipid and cholesterol levels. These may be checked every 5 years, or more frequently if you are over 55 years old.  Skin check.  Lung cancer screening. You may have this screening every year starting at age 55 if you have a 30-pack-year history of smoking and currently smoke or have quit within the past 15 years.  Fecal occult blood test (FOBT) of the stool. You may have this test every year starting at age 55.  Flexible sigmoidoscopy or colonoscopy. You may have a sigmoidoscopy every 5 years or a colonoscopy  every 10 years starting at age 55.  Hepatitis C blood test.  Hepatitis B blood test.  Sexually transmitted disease (STD) testing.  Diabetes screening. This is done by checking your blood sugar (glucose) after you have not eaten for a while (fasting). You may have this done every 1-3 years.  Mammogram. This may be done every 1-2 years. Talk to your health care provider about when you should start having regular mammograms. This may depend on whether you have a family history of breast cancer.  BRCA-related cancer screening. This may be done if you have a family history of breast, ovarian, tubal, or peritoneal cancers.  Pelvic exam and Pap test. This may be done every 3 years starting at age 55. Starting at age 55, this may be done every 5 years if you have a Pap test in combination with an HPV test.  Bone density scan. This is done to screen for osteoporosis. You may have this scan if you are at high risk for osteoporosis.  Discuss your test results, treatment options, and if necessary, the need for more tests with your health care provider. Vaccines Your health care provider may recommend certain vaccines, such as:  Influenza vaccine. This is recommended every year.  Tetanus, diphtheria, and acellular pertussis (Tdap, Td) vaccine. You may need a Td booster every 10 years.  Varicella vaccine. You may need this if you have not been vaccinated.  Zoster vaccine. You may need this after age 5.  Measles, mumps, and rubella (MMR) vaccine. You may need at least one dose of MMR if you were born in  1955 or later. You may also need a second dose.  Pneumococcal 13-valent conjugate (PCV13) vaccine. You may need this if you have certain conditions and were not previously vaccinated.  Pneumococcal polysaccharide (PPSV23) vaccine. You may need one or two doses if you smoke cigarettes or if you have certain conditions.  Meningococcal vaccine. You may need this if you have certain  conditions.  Hepatitis A vaccine. You may need this if you have certain conditions or if you travel or work in places where you may be exposed to hepatitis A.  Hepatitis B vaccine. You may need this if you have certain conditions or if you travel or work in places where you may be exposed to hepatitis B.  Haemophilus influenzae type b (Hib) vaccine. You may need this if you have certain conditions.  Talk to your health care provider about which screenings and vaccines you need and how often you need them. This information is not intended to replace advice given to you by your health care provider. Make sure you discuss any questions you have with your health care provider. Document Released: 02/28/2015 Document Revised: 10/22/2015 Document Reviewed: 12/03/2014 Elsevier Interactive Patient Education  2018 [employer]  

## 2017-05-13 NOTE — Progress Notes (Signed)
Subjective:     Anna Jackson is a 57 y.o. female and is here for a comprehensive physical exam. The patient reports problems - cough since her bronchitis --- cough keeps her awake.  she is also having panic attacks and she doesnt know why. .she gets jittery inside and can happen at any tine,  No chest pain.     Social History   Socioeconomic History  . Marital status: Single    Spouse name: Not on file  . Number of children: 1  . Years of education: Not on file  . Highest education level: Not on file  Occupational History  . Occupation: Biochemist, clinical: [employer]  Social Needs  . Financial resource strain: Not on file  . Food insecurity:    Worry: Not on file    Inability: Not on file  . Transportation needs:    Medical: Not on file    Non-medical: Not on file  Tobacco Use  . Smoking status: Never Smoker  . Smokeless tobacco: Never Used  Substance and Sexual Activity  . Alcohol use: Yes    Comment: occasional  . Drug use: No  . Sexual activity: Not Currently    Partners: Male  Lifestyle  . Physical activity:    Days per week: Not on file    Minutes per session: Not on file  . Stress: Not on file  Relationships  . Social connections:    Talks on phone: Not on file    Gets together: Not on file    Attends religious service: Not on file    Active member of club or organization: Not on file    Attends meetings of clubs or organizations: Not on file    Relationship status: Not on file  . Intimate partner violence:    Fear of current or ex partner: Not on file    Emotionally abused: Not on file    Physically abused: Not on file    Forced sexual activity: Not on file  Other Topics Concern  . Not on file  Social History Narrative   Exercise-- walking   Health Maintenance  Topic Date Due  . MAMMOGRAM  01/18/2017  . Hepatitis C Screening  05/14/2023 (Originally 01-09-1961)  . HIV Screening  05/14/2023 (Originally 11/14/1975)  . TETANUS/TDAP   11/22/2020  . COLONOSCOPY  11/12/2026  . 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 She does not have any pertinent problems on file. She  has a past surgical history that includes Cholecystectomy (1995); Nissen fundoplication (5732); Cervical conization w/bx; Tubal ligation; Knee surgery (Right, 02/2010); Axillary lymph node dissection (Left, 03/09/2013); Portacath placement (N/A, 03/23/2013); Breast biopsy (Left, 02/13/2013); Breast excisional biopsy (Left, 03/09/2013); Esophageal manometry (N/A, 01/12/2017); Incisional hernia repair (2004); and Vaginal hysterectomy (2002). Her family history includes Breast cancer (age of onset: 51) in her maternal grandmother; Cancer (age of onset: 13) in her maternal grandmother; Coronary artery disease in her unknown relative; Heart disease in her brother and mother; Hypertension in her unknown relative; Migraines in her unknown relative. She  reports that she has never smoked. She has never used smokeless tobacco. She reports that she drinks alcohol. She reports that she does not use drugs. She has a current medication list which includes the following prescription(s): albuterol, beclomethasone, vitamin d, ondansetron, promethazine-dextromethorphan, valacyclovir, vitamin b-12, alprazolam, and fluoxetine. Current Outpatient Medications on File Prior to Visit  Medication Sig  Dispense Refill  . albuterol (PROAIR HFA) 108 (90 Base) MCG/ACT inhaler 2 puffs q6h prn 1 Inhaler 2  . beclomethasone (QVAR REDIHALER) 40 MCG/ACT inhaler Inhale 2 puffs into the lungs 2 (two) times daily. 10.6 g 12  . Cholecalciferol (VITAMIN D) 2000 units tablet Take 2,000 Units by mouth daily.    . ondansetron (ZOFRAN) 4 MG tablet Take 1 tablet (4 mg total) by mouth every 8 (eight) hours as needed for nausea. 8 tablet 5  . promethazine-dextromethorphan (PROMETHAZINE-DM) 6.25-15 MG/5ML syrup Take 5 mLs by mouth 4 (four) times daily as needed. 118 mL 0  . valACYclovir (VALTREX) 1000 MG tablet Take 1 tablet (1,000 mg total) by mouth 3 (three) times daily. (Patient taking differently: Take 1,000 mg by mouth See admin instructions. Takes three times a day for breakouts) 90 tablet 1  . vitamin B-12 (CYANOCOBALAMIN) 1000 MCG tablet Take 1,000 mcg by mouth daily.     No current facility-administered medications on file prior to visit.    She is allergic to betadine [povidone iodine] and codeine..  Review of Systems Review of Systems  Constitutional: Negative for activity change, appetite change and fatigue.  HENT: Negative for hearing loss, congestion, tinnitus and ear discharge.  dentist q32m Eyes: Negative for visual disturbance (see optho q1y -- vision corrected to 20/20 with glasses).  Respiratory: Negative for cough, chest tightness and shortness of breath.   Cardiovascular: Negative for chest pain, palpitations and leg swelling.  Gastrointestinal: Negative for abdominal pain, diarrhea, constipation and abdominal distention.  Genitourinary: Negative for urgency, frequency, decreased urine volume and difficulty urinating.  Musculoskeletal: Negative for back pain, arthralgias and gait problem.  Skin: Negative for color change, pallor and rash.  Neurological: Negative for dizziness, light-headedness, numbness and headaches.  Hematological: Negative for adenopathy. Does not bruise/bleed easily.   Psychiatric/Behavioral: Negative for suicidal ideas, confusion, sleep disturbance, self-injury, dysphoric mood, decreased concentration and agitation.      Objective:    BP 138/84 (BP Location: Left Arm, Cuff Size: Large)   Pulse 80   Temp 98.3 F (36.8 C) (Oral)   Resp 16   Ht 5\' 8"  (1.727 m)   Wt 247 lb 9.6 oz (112.3 kg)   SpO2 97%   BMI 37.65 kg/m  General appearance: alert, cooperative, appears stated age and no distress Head: Normocephalic, without obvious abnormality, atraumatic Eyes: negative findings: lids and lashes normal, conjunctivae and sclerae normal and pupils equal, round, reactive to light and accomodation Ears: normal TM's and external ear canals both ears Nose: Nares normal. Septum midline. Mucosa normal. No drainage or sinus tenderness. Throat: lips, mucosa, and tongue normal; teeth and gums normal Neck: no adenopathy, no carotid bruit, no JVD, supple, symmetrical, trachea midline and thyroid not enlarged, symmetric, no tenderness/mass/nodules Back: symmetric, no curvature. ROM normal. No CVA tenderness. Lungs: clear to auscultation bilaterally Breasts: normal appearance, no masses or tenderness Heart: regular rate and rhythm, S1, S2 normal, no murmur, click, rub or gallop Abdomen: soft, non-tender; bowel sounds normal; no masses,  no organomegaly Pelvic: deferred Extremities: extremities normal, atraumatic, no cyanosis or edema Pulses: 2+ and symmetric Skin: Skin color, texture, turgor normal.  No rashes or lesions Lymph nodes: Cervical, supraclavicular, and axillary nodes normal. Neurologic: Alert and oriented X 3, normal strength and tone. Normal symmetric reflexes. Normal coordination and gait   Assessment:    Healthy female exam.      Plan:    ghm utd Check labs See After Visit Summary for Counseling Recommendations    1. Preventative health care See above - CBC with Differential/Platelet - Comprehensive metabolic panel - Lipid panel -  TSH  2. Essential hypertension Well controlled, no changes to meds. Encouraged heart healthy diet such as the DASH diet and exercise as tolerated.  - CBC with Differential/Platelet - Comprehensive metabolic panel  3. Hyperlipidemia LDL goal <100 Encouraged heart healthy diet, increase exercise, avoid trans fats, consider a krill oil cap daily - Comprehensive metabolic panel - Lipid panel  4. Breast cancer screening  - MM DIAG BREAST TOMO BILATERAL; Future  5. Anxiety Inc prozac 40 mg daily add xanax temporarily prn - FLUoxetine (PROZAC) 40 MG capsule; Take 1 capsule (40 mg total) by mouth daily.  Dispense: 90 capsule; Refill: 3 - ALPRAZolam (XANAX) 0.25 MG tablet; Take 1 tablet (0.25 mg total) by mouth 2 (two) times daily as needed for anxiety.  Dispense: 20 tablet; Refill: 0

## 2017-05-14 LAB — COMPREHENSIVE METABOLIC PANEL
AG RATIO: 1.4 (calc) (ref 1.0–2.5)
ALT: 20 U/L (ref 6–29)
AST: 21 U/L (ref 10–35)
Albumin: 3.9 g/dL (ref 3.6–5.1)
Alkaline phosphatase (APISO): 167 U/L — ABNORMAL HIGH (ref 33–130)
BILIRUBIN TOTAL: 0.5 mg/dL (ref 0.2–1.2)
BUN: 10 mg/dL (ref 7–25)
CALCIUM: 9.4 mg/dL (ref 8.6–10.4)
CO2: 28 mmol/L (ref 20–32)
Chloride: 102 mmol/L (ref 98–110)
Creat: 0.66 mg/dL (ref 0.50–1.05)
GLUCOSE: 90 mg/dL (ref 65–99)
Globulin: 2.7 g/dL (calc) (ref 1.9–3.7)
Potassium: 3.6 mmol/L (ref 3.5–5.3)
Sodium: 139 mmol/L (ref 135–146)
Total Protein: 6.6 g/dL (ref 6.1–8.1)

## 2017-05-14 LAB — CBC WITH DIFFERENTIAL/PLATELET
BASOS ABS: 37 {cells}/uL (ref 0–200)
Basophils Relative: 0.5 %
EOS ABS: 104 {cells}/uL (ref 15–500)
EOS PCT: 1.4 %
HCT: 37 % (ref 35.0–45.0)
HEMOGLOBIN: 12.1 g/dL (ref 11.7–15.5)
Lymphs Abs: 1850 cells/uL (ref 850–3900)
MCH: 25.5 pg — AB (ref 27.0–33.0)
MCHC: 32.7 g/dL (ref 32.0–36.0)
MCV: 77.9 fL — ABNORMAL LOW (ref 80.0–100.0)
MONOS PCT: 8.4 %
MPV: 10.9 fL (ref 7.5–12.5)
NEUTROS ABS: 4788 {cells}/uL (ref 1500–7800)
NEUTROS PCT: 64.7 %
Platelets: 370 10*3/uL (ref 140–400)
RBC: 4.75 10*6/uL (ref 3.80–5.10)
RDW: 14.9 % (ref 11.0–15.0)
Total Lymphocyte: 25 %
WBC mixed population: 622 cells/uL (ref 200–950)
WBC: 7.4 10*3/uL (ref 3.8–10.8)

## 2017-05-14 LAB — LIPID PANEL
Cholesterol: 173 mg/dL (ref <200)
HDL: 61 mg/dL (ref >50)
LDL CHOLESTEROL (CALC): 89 mg/dL
Non-HDL Cholesterol (Calc): 112 mg/dL (calc) (ref <130)
TRIGLYCERIDES: 134 mg/dL (ref <150)
Total CHOL/HDL Ratio: 2.8 (calc) (ref <5.0)

## 2017-05-14 LAB — TSH: TSH: 0.33 mIU/L — ABNORMAL LOW

## 2017-05-20 ENCOUNTER — Other Ambulatory Visit: Payer: Self-pay | Admitting: *Deleted

## 2017-05-20 ENCOUNTER — Telehealth: Payer: Self-pay | Admitting: Family Medicine

## 2017-05-20 DIAGNOSIS — E039 Hypothyroidism, unspecified: Secondary | ICD-10-CM

## 2017-05-20 NOTE — Telephone Encounter (Unsigned)
Copied from [place] [id number]. Topic: General - Other >> May 20, 2017  3:57 PM Carolyn Stare wrote:  Pt call back for lab results Triage line busy      [phone]

## 2017-05-20 NOTE — Telephone Encounter (Signed)
Patient called, left VM to return the call to the office to receive lab results.

## 2017-05-20 NOTE — Telephone Encounter (Signed)
Pt given results per notes of Dr. Carollee Herter  on 05/17/17. Unable to document in result note due to result note not being routed to [facility].   Pt will need to be called back in order to have lab appt scheduled. Notes of provider did not specify how soon the pt needed to be seen for repeat labs.

## 2017-05-20 NOTE — Telephone Encounter (Signed)
See other encounter.

## 2017-05-20 NOTE — Telephone Encounter (Signed)
Copied from [place] [id number]. Topic: Quick Communication - Lab Results >> May 20, 2017  3:59 PM Carolyn Stare wrote:  Retunring a call for LAB results Triage line busy

## 2017-05-23 NOTE — Telephone Encounter (Signed)
When do you want her to return?

## 2017-05-23 NOTE — Telephone Encounter (Signed)
In the next few weeks.

## 2017-05-24 NOTE — Telephone Encounter (Signed)
Called pt 8:22am no answer stated reason for call to notify pt that per Dr.Lowne she should repeat labs in a few weeks please call and schedule lab only appointment to recheck labs.

## 2017-06-02 ENCOUNTER — Ambulatory Visit (HOSPITAL_BASED_OUTPATIENT_CLINIC_OR_DEPARTMENT_OTHER)
Admission: RE | Admit: 2017-06-02 | Discharge: 2017-06-02 | Disposition: A | Discharge status: Home / Self Care | Payer: Managed Care, Other (non HMO) | Source: Ambulatory Visit | Attending: Family Medicine | Admitting: Family Medicine

## 2017-06-02 ENCOUNTER — Ambulatory Visit: Payer: Managed Care, Other (non HMO) | Admitting: Family Medicine

## 2017-06-02 ENCOUNTER — Encounter: Payer: Self-pay | Admitting: Family Medicine

## 2017-06-02 VITALS — BP 112/78 | HR 93 | Temp 98.0°F | Resp 16 | Ht 68.0 in | Wt 245.8 lb

## 2017-06-02 DIAGNOSIS — E039 Hypothyroidism, unspecified: Secondary | ICD-10-CM | POA: Diagnosis not present

## 2017-06-02 DIAGNOSIS — R059 Cough, unspecified: Secondary | ICD-10-CM

## 2017-06-02 DIAGNOSIS — Z79899 Other long term (current) drug therapy: Secondary | ICD-10-CM | POA: Diagnosis not present

## 2017-06-02 DIAGNOSIS — R05 Cough: Secondary | ICD-10-CM

## 2017-06-02 DIAGNOSIS — F419 Anxiety disorder, unspecified: Secondary | ICD-10-CM | POA: Diagnosis not present

## 2017-06-02 LAB — T4, FREE: Free T4: 0.93 ng/dL (ref 0.60–1.60)

## 2017-06-02 LAB — T3, FREE: T3, Free: 3.3 pg/mL (ref 2.3–4.2)

## 2017-06-02 LAB — TSH: TSH: 0.39 u[IU]/mL (ref 0.35–4.50)

## 2017-06-02 MED ORDER — CETIRIZINE HCL 10 MG PO TABS: 10.0000 mg | ORAL_TABLET | Freq: Every day | ORAL | 11 refills | Status: DC

## 2017-06-02 MED ORDER — PREDNISONE 10 MG PO TABS: ORAL_TABLET | ORAL | 0 refills | Status: DC

## 2017-06-02 NOTE — Progress Notes (Signed)
Patient ID: Anna Jackson, female    DOB: 1961-02-04  Age: 57 y.o. MRN: [id number]    Subjective:  Subjective  HPI Anna Jackson presents for f/u cough.  Cough is worse at night but occurs all day.  Not productive, no fever, chills, no wheezing She also needs to have her thyroid function rechecked.  The panic attacks are worse   Review of Systems  Constitutional: Negative for activity change, appetite change, chills, diaphoresis, fatigue, fever and unexpected weight change.  Eyes: Negative for pain, redness and visual disturbance.  Respiratory: Negative for cough, chest tightness, shortness of breath and wheezing.   Cardiovascular: Negative for chest pain, palpitations and leg swelling.  Gastrointestinal: Negative for abdominal distention and abdominal pain.  Endocrine: Negative for cold intolerance, heat intolerance, polydipsia, polyphagia and polyuria.  Genitourinary: Negative for difficulty urinating, dyspareunia, dysuria, flank pain, frequency, genital sores, hematuria, menstrual problem, pelvic pain, urgency, vaginal discharge and vaginal pain.  Musculoskeletal: Negative for back pain.  Neurological: Negative for dizziness, light-headedness, numbness and headaches.   Review of Systems  Constitutional: Negative for activity change, appetite change and fatigue.  HENT: Negative for hearing loss, congestion, tinnitus and ear discharge.  dentist q81m Eyes: Negative for visual disturbance (see optho q1y -- vision corrected to 20/20 with glasses).  Respiratory: Negative for cough, chest tightness and shortness of breath.   Cardiovascular: Negative for chest pain, palpitations and leg swelling.  Gastrointestinal: Negative for abdominal pain, diarrhea, constipation and abdominal distention.  Genitourinary: Negative for urgency, frequency, decreased urine volume and difficulty urinating.  Musculoskeletal: Negative for back pain, arthralgias and gait problem.  Skin: Negative for  color change, pallor and rash.  Neurological: Negative for dizziness, light-headedness, numbness and headaches.  Hematological: Negative for adenopathy. Does not bruise/bleed easily.  Psychiatric/Behavioral: + anxiety/ panic attacks     History Past Medical History:  Diagnosis Date  . Adenopathy    left axillary  . Anemia   . Anxiety   . Anxiety state, unspecified   . Asthma    no problem with asthma in several years  . Barrett esophagus   . Depression   . Esophageal reflux   . Headache 05/16/2013  . Herpes simplex without mention of complication   . Hyperlipidemia 03/12/2014  . Hypertension   . Lymphoma, T-cell ([place]) 03/20/2013  . Mental disorder   . Neuropathic pain 04/04/2014  . Non Hodgkin's lymphoma ([place]) 2015  . Skin infection 11/02/2013  . Unspecified asthma(493.90)   . Unspecified essential hypertension   . URI (upper respiratory infection) 01/15/2014    She has a past surgical history that includes Cholecystectomy (1995); Nissen fundoplication (1601); Cervical conization w/bx; Tubal ligation; Knee surgery (Right, 02/2010); Axillary lymph node dissection (Left, 03/09/2013); Portacath placement (N/A, 03/23/2013); Breast biopsy (Left, 02/13/2013); Breast excisional biopsy (Left, 03/09/2013); Esophageal manometry (N/A, 01/12/2017); Incisional hernia repair (2004); and Vaginal hysterectomy (2002).   Her family history includes Breast cancer (age of onset: 47) in her maternal grandmother; Cancer (age of onset: 101) in her maternal grandmother; Coronary artery disease in her unknown relative; Heart disease in her brother and mother; Hypertension in her unknown relative; Migraines in her unknown relative.She reports that she has never smoked. She has never used smokeless tobacco. She reports that she drinks alcohol. She reports that she does not use drugs.  Current Outpatient Medications on File Prior to Visit  Medication Sig Dispense Refill  . albuterol (PROAIR HFA) 108 (90 Base)  MCG/ACT inhaler 2 puffs q6h prn 1 Inhaler  2  . ALPRAZolam (XANAX) 0.25 MG tablet Take 1 tablet (0.25 mg total) by mouth 2 (two) times daily as needed for anxiety. 20 tablet 0  . beclomethasone (QVAR REDIHALER) 40 MCG/ACT inhaler Inhale 2 puffs into the lungs 2 (two) times daily. 10.6 g 12  . Cholecalciferol (VITAMIN D) 2000 units tablet Take 2,000 Units by mouth daily.    Marland Kitchen FLUoxetine (PROZAC) 40 MG capsule Take 1 capsule (40 mg total) by mouth daily. 90 capsule 3  . ondansetron (ZOFRAN) 4 MG tablet Take 1 tablet (4 mg total) by mouth every 8 (eight) hours as needed for nausea. 8 tablet 5  . promethazine-dextromethorphan (PROMETHAZINE-DM) 6.25-15 MG/5ML syrup Take 5 mLs by mouth 4 (four) times daily as needed. 118 mL 0  . valACYclovir (VALTREX) 1000 MG tablet Take 1 tablet (1,000 mg total) by mouth 3 (three) times daily. (Patient taking differently: Take 1,000 mg by mouth See admin instructions. Takes three times a day for breakouts) 90 tablet 1  . vitamin B-12 (CYANOCOBALAMIN) 1000 MCG tablet Take 1,000 mcg by mouth daily.     No current facility-administered medications on file prior to visit.      Objective:  Objective  Physical Exam  Constitutional: She is oriented to person, place, and time. She appears well-developed and well-nourished.  HENT:  Head: Normocephalic and atraumatic.  Eyes: Conjunctivae and EOM are normal.  Neck: Normal range of motion. Neck supple. No JVD present. Carotid bruit is not present. No thyromegaly present.  Cardiovascular: Normal rate, regular rhythm and normal heart sounds.  No murmur heard. Pulmonary/Chest: Effort normal and breath sounds normal. No respiratory distress. She has no wheezes. She has no rales. She exhibits no tenderness.  Musculoskeletal: She exhibits no edema.  Neurological: She is alert and oriented to person, place, and time.  Psychiatric: She has a normal mood and affect.  Nursing note and vitals reviewed.  BP 112/78 (BP Location: Right  Arm, Cuff Size: Large)   Pulse 93   Temp 98 F (36.7 C) (Oral)   Resp 16   Ht 5\' 8"  (1.727 m)   Wt 245 lb 12.8 oz (111.5 kg)   SpO2 98%   BMI 37.37 kg/m  Wt Readings from Last 3 Encounters:  06/02/17 245 lb 12.8 oz (111.5 kg)  05/13/17 247 lb 9.6 oz (112.3 kg)  03/28/17 257 lb 12.8 oz (116.9 kg)     Lab Results  Component Value Date   WBC 7.4 05/13/2017   HGB 12.1 05/13/2017   HCT 37.0 05/13/2017   PLT 370 05/13/2017   GLUCOSE 90 05/13/2017   CHOL 173 05/13/2017   TRIG 134 05/13/2017   HDL 61 05/13/2017   LDLDIRECT 105.0 11/23/2010   LDLCALC 89 05/13/2017   ALT 20 05/13/2017   AST 21 05/13/2017   NA 139 05/13/2017   K 3.6 05/13/2017   CL 102 05/13/2017   CREATININE 0.66 05/13/2017   BUN 10 05/13/2017   CO2 28 05/13/2017   TSH 0.33 (L) 05/13/2017   INR 0.99 03/29/2013    No results found.   Assessment & Plan:  Plan  I am having Anna Jackson start on cetirizine and predniSONE. I am also having her maintain her vitamin B-12, Vitamin D, valACYclovir, ondansetron, albuterol, promethazine-dextromethorphan, beclomethasone, FLUoxetine, and ALPRAZolam.  Meds ordered this encounter  Medications  . cetirizine (ZYRTEC) 10 MG tablet    Sig: Take 1 tablet (10 mg total) by mouth daily.    Dispense:  30 tablet  Refill:  11  . predniSONE (DELTASONE) 10 MG tablet    Sig: TAKE 3 TABLETS PO QD FOR 3 DAYS THEN TAKE 2 TABLETS PO QD FOR 3 DAYS THEN TAKE 1 TABLET PO QD FOR 3 DAYS THEN TAKE 1/2 TAB PO QD FOR 3 DAYS    Dispense:  20 tablet    Refill:  0    Problem List Items Addressed This Visit    None    Visit Diagnoses    Anxiety    -  Primary   Relevant Orders   Pain Mgmt, Profile 8 w/Conf, U   High risk medication use       Relevant Orders   Pain Mgmt, Profile 8 w/Conf, U   Cough       Relevant Medications   cetirizine (ZYRTEC) 10 MG tablet   predniSONE (DELTASONE) 10 MG tablet   Other Relevant Orders   DG Chest 2 View   Hypothyroidism, unspecified type           Follow-up: Return in about 6 months (around 12/02/2017), or if symptoms worsen or fail to improve.  Ann Held, DO

## 2017-06-02 NOTE — Patient Instructions (Signed)
Cough, Adult  Coughing is a reflex that clears your throat and your airways. Coughing helps to heal and protect your lungs. It is normal to cough occasionally, but a cough that happens with other symptoms or lasts a long time may be a sign of a condition that needs treatment. A cough may last only 2-3 weeks (acute), or it may last longer than 8 weeks (chronic).  What are the causes?  Coughing is commonly caused by:   Breathing in substances that irritate your lungs.   A viral or bacterial respiratory infection.   Allergies.   Asthma.   Postnasal drip.   Smoking.   Acid backing up from the stomach into the esophagus (gastroesophageal reflux).   Certain medicines.   Chronic lung problems, including COPD (or rarely, lung cancer).   Other medical conditions such as heart failure.    Follow these instructions at home:  Pay attention to any changes in your symptoms. Take these actions to help with your discomfort:   Take medicines only as told by your health care provider.  ? If you were prescribed an antibiotic medicine, take it as told by your health care provider. Do not stop taking the antibiotic even if you start to feel better.  ? Talk with your health care provider before you take a cough suppressant medicine.   Drink enough fluid to keep your urine clear or pale yellow.   If the air is dry, use a cold steam vaporizer or humidifier in your bedroom or your home to help loosen secretions.   Avoid anything that causes you to cough at work or at home.   If your cough is worse at night, try sleeping in a semi-upright position.   Avoid cigarette smoke. If you smoke, quit smoking. If you need help quitting, ask your health care provider.   Avoid caffeine.   Avoid alcohol.   Rest as needed.    Contact a health care provider if:   You have new symptoms.   You cough up pus.   Your cough does not get better after 2-3 weeks, or your cough gets worse.   You cannot control your cough with suppressant  medicines and you are losing sleep.   You develop pain that is getting worse or pain that is not controlled with pain medicines.   You have a fever.   You have unexplained weight loss.   You have night sweats.  Get help right away if:   You cough up blood.   You have difficulty breathing.   Your heartbeat is very fast.  This information is not intended to replace advice given to you by your health care provider. Make sure you discuss any questions you have with your health care provider.  Document Released: 07/31/2010 Document Revised: 07/10/2015 Document Reviewed: 04/10/2014  [employer]vier Interactive Patient Education  2018 [employer]

## 2017-06-06 LAB — PAIN MGMT, PROFILE 8 W/CONF, U
6 Acetylmorphine: NEGATIVE ng/mL (ref <10)
ALPHAHYDROXYMIDAZOLAM: NEGATIVE ng/mL (ref <50)
ALPHAHYDROXYTRIAZOLAM: NEGATIVE ng/mL (ref <50)
Alcohol Metabolites: NEGATIVE ng/mL (ref <500)
Alphahydroxyalprazolam: NEGATIVE ng/mL (ref <25)
Aminoclonazepam: NEGATIVE ng/mL (ref <25)
Amphetamines: NEGATIVE ng/mL (ref <500)
BENZODIAZEPINES: NEGATIVE ng/mL (ref <100)
Buprenorphine, Urine: NEGATIVE ng/mL (ref <5)
Cocaine Metabolite: NEGATIVE ng/mL (ref <150)
Creatinine: 120.4 mg/dL
HYDROXYETHYLFLURAZEPAM: NEGATIVE ng/mL (ref <50)
Lorazepam: NEGATIVE ng/mL (ref <50)
MARIJUANA METABOLITE: NEGATIVE ng/mL (ref <20)
MDMA: NEGATIVE ng/mL (ref <500)
Nordiazepam: NEGATIVE ng/mL (ref <50)
OXAZEPAM: NEGATIVE ng/mL (ref <50)
OXYCODONE: NEGATIVE ng/mL (ref <100)
Opiates: NEGATIVE ng/mL (ref <100)
Oxidant: NEGATIVE ug/mL (ref <200)
Temazepam: NEGATIVE ng/mL (ref <50)
pH: 7.26 (ref 4.5–9.0)

## 2017-06-21 ENCOUNTER — Other Ambulatory Visit: Payer: Managed Care, Other (non HMO)

## 2017-06-21 ENCOUNTER — Encounter: Payer: Managed Care, Other (non HMO) | Admitting: Adult Health

## 2017-11-02 ENCOUNTER — Telehealth: Payer: Self-pay

## 2017-11-02 NOTE — Telephone Encounter (Signed)
Patient requested to move appointment up due to she planned to be move soon. Per 9/18 phone que

## 2017-11-17 ENCOUNTER — Inpatient Hospital Stay: Payer: Managed Care, Other (non HMO) | Attending: Hematology and Oncology

## 2017-11-17 ENCOUNTER — Inpatient Hospital Stay: Payer: Managed Care, Other (non HMO) | Admitting: Hematology and Oncology

## 2017-11-17 ENCOUNTER — Encounter: Payer: Self-pay | Admitting: Hematology and Oncology

## 2017-11-17 DIAGNOSIS — F411 Generalized anxiety disorder: Secondary | ICD-10-CM | POA: Insufficient documentation

## 2017-11-17 DIAGNOSIS — D509 Iron deficiency anemia, unspecified: Secondary | ICD-10-CM | POA: Insufficient documentation

## 2017-11-17 DIAGNOSIS — Z79899 Other long term (current) drug therapy: Secondary | ICD-10-CM | POA: Diagnosis not present

## 2017-11-17 DIAGNOSIS — Z8572 Personal history of non-Hodgkin lymphomas: Secondary | ICD-10-CM | POA: Insufficient documentation

## 2017-11-17 DIAGNOSIS — Z8579 Personal history of other malignant neoplasms of lymphoid, hematopoietic and related tissues: Secondary | ICD-10-CM

## 2017-11-17 DIAGNOSIS — Z9221 Personal history of antineoplastic chemotherapy: Secondary | ICD-10-CM

## 2017-11-17 DIAGNOSIS — E538 Deficiency of other specified B group vitamins: Secondary | ICD-10-CM

## 2017-11-17 DIAGNOSIS — K2270 Barrett's esophagus without dysplasia: Secondary | ICD-10-CM

## 2017-11-17 LAB — CBC WITH DIFFERENTIAL/PLATELET
BASOS ABS: 0 10*3/uL (ref 0.0–0.1)
BASOS PCT: 1 %
EOS PCT: 1 %
Eosinophils Absolute: 0.1 10*3/uL (ref 0.0–0.5)
HCT: 38.4 % (ref 34.8–46.6)
Hemoglobin: 12.6 g/dL (ref 11.6–15.9)
LYMPHS PCT: 21 %
Lymphs Abs: 1.5 10*3/uL (ref 0.9–3.3)
MCH: 27.5 pg (ref 25.1–34.0)
MCHC: 32.7 g/dL (ref 31.5–36.0)
MCV: 83.9 fL (ref 79.5–101.0)
MONO ABS: 0.4 10*3/uL (ref 0.1–0.9)
Monocytes Relative: 6 %
NEUTROS ABS: 5.2 10*3/uL (ref 1.5–6.5)
Neutrophils Relative %: 71 %
PLATELETS: 294 10*3/uL (ref 145–400)
RBC: 4.57 MIL/uL (ref 3.70–5.45)
RDW: 14.6 % — AB (ref 11.2–14.5)
WBC: 7.3 10*3/uL (ref 3.9–10.3)

## 2017-11-17 LAB — VITAMIN B12: VITAMIN B 12: 517 pg/mL (ref 180–914)

## 2017-11-17 LAB — COMPREHENSIVE METABOLIC PANEL
ALBUMIN: 3.5 g/dL (ref 3.5–5.0)
ALK PHOS: 175 U/L — AB (ref 38–126)
ALT: 18 U/L (ref 0–44)
ANION GAP: 8 (ref 5–15)
AST: 18 U/L (ref 15–41)
BILIRUBIN TOTAL: 0.7 mg/dL (ref 0.3–1.2)
BUN: 9 mg/dL (ref 6–20)
CALCIUM: 9.1 mg/dL (ref 8.9–10.3)
CO2: 30 mmol/L (ref 22–32)
Chloride: 104 mmol/L (ref 98–111)
Creatinine, Ser: 0.82 mg/dL (ref 0.44–1.00)
GFR calc Af Amer: >60 mL/min (ref >60)
GFR calc non Af Amer: >60 mL/min (ref >60)
GLUCOSE: 120 mg/dL — AB (ref 70–99)
Potassium: 3.2 mmol/L — ABNORMAL LOW (ref 3.5–5.1)
Sodium: 142 mmol/L (ref 135–145)
TOTAL PROTEIN: 6.8 g/dL (ref 6.5–8.1)

## 2017-11-17 LAB — IRON AND TIBC
Iron: 37 ug/dL — ABNORMAL LOW (ref 41–142)
SATURATION RATIOS: 11 % — AB (ref 21–57)
TIBC: 338 ug/dL (ref 236–444)
UIBC: 300 ug/dL

## 2017-11-17 LAB — LACTATE DEHYDROGENASE: LDH: 212 U/L — ABNORMAL HIGH (ref 98–192)

## 2017-11-17 LAB — FERRITIN: FERRITIN: 24 ng/mL (ref 11–307)

## 2017-11-17 NOTE — Progress Notes (Signed)
[place] OFFICE PROGRESS NOTE  Patient Care Team: Carollee Herter, Alferd Apa, DO as PCP - General Fanny Skates, MD as Consulting Physician (General Surgery) Heath Lark, MD as Consulting Physician (Hematology and Oncology) Michael Boston, MD as Consulting Physician (General Surgery) Danis, Kirke Corin, MD as Consulting Physician (Gastroenterology) Ena Dawley, MD as Consulting Physician (Obstetrics and Gynecology) Garald Balding, MD as Consulting Physician (Orthopedic Surgery)  ASSESSMENT & PLAN:  History of lymphoma Examination is unremarkable for disease recurrence I do not feel strongly we need to pursue repeat CT imaging in the absence of symptoms The patient is more than 4 years out, a long-term cancer survivor She is relocating to [facility] I recommend her to establish oncologist once she is settled in her new home.  Barrett's esophagus She has history of Barrett's esophagus status post treatment She needs to establish with a local gastroenterologist for further management and surveillance in the future  Iron deficiency anemia She has history of iron deficiency anemia due to chronic GI bleed from Barrett's esophagus Even though she is not anemic, she is mildly iron deficient We will call the patient to resume oral iron supplement  Vitamin B12 deficiency She has history of vitamin B12 deficiency Serum vitamin B12 is pending We will call the patient with results and instruction  Anxiety state She is taking Prozac She was also prescribed some anxiolytics We discussed coping strategy and exercise as tolerated   No orders of the defined types were placed in this encounter.   INTERVAL HISTORY: Please see below for problem oriented charting. 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)   Staging method: AJCC 7th Edition   Clinical: Stage II signed by Heath Lark, MD on 04/03/2013  3:31 PM   Summary: Stage II      History of lymphoma   02/07/2013 Imaging    [place] confirmed enlarged LN    03/09/2013 Procedure    LN biopsy confirmed T cell lymphoma    03/23/2013 Procedure    She has port placement    03/26/2013 Imaging    Echo showed normal ejection fraction    03/29/2013 Bone Marrow Biopsy    Bone marrow biopsy was negative    04/02/2013 Imaging    PET/CT scan showed localized, stage II disease    04/04/2013 - 07/20/2013 Chemotherapy    She has completed 6 cycles of CHOP plus etoposide chemotherapy.    06/01/2013 Imaging    PET CT scan show complete response to treatment    06/27/2013 Adverse Reaction    Dose of vincristine was reduced due to peripheral neuropathy.    08/31/2013 Imaging    Repeat PET scans show no evidence of disease.    06/10/2014 Imaging    CT chest showed no evidence of recurrence     REVIEW OF SYSTEMS:   Constitutional: Denies fevers, chills or abnormal weight loss Eyes: Denies blurriness of vision Ears, nose, mouth, throat, and face: Denies mucositis or sore throat Respiratory: Denies cough, dyspnea or wheezes Cardiovascular: Denies palpitation, chest discomfort or lower extremity swelling Gastrointestinal:  Denies nausea, heartburn or change in bowel habits Skin: Denies abnormal skin rashes Lymphatics: Denies new lymphadenopathy  or easy bruising Neurological:Denies numbness, tingling or new weaknesses All other systems were reviewed with the patient and are negative.  I have reviewed the past medical history, past surgical history, social history and family history with the patient and they are unchanged from previous note.  ALLERGIES:  is allergic to betadine  [povidone iodine] and codeine.  MEDICATIONS:  Current Outpatient Medications  Medication Sig Dispense Refill  . albuterol (PROAIR HFA) 108 (90 Base) MCG/ACT inhaler 2 puffs q6h prn 1 Inhaler 2  . ALPRAZolam (XANAX) 0.25 MG tablet Take 1 tablet (0.25 mg total) by mouth 2 (two) times daily as needed for anxiety. 20 tablet 0  . beclomethasone (QVAR REDIHALER) 40 MCG/ACT inhaler Inhale 2 puffs into the lungs 2 (two) times daily. 10.6 g 12  . cetirizine (ZYRTEC) 10 MG tablet Take 1 tablet (10 mg total) by mouth daily. 30 tablet 11  . Cholecalciferol (VITAMIN D) 2000 units tablet Take 2,000 Units by mouth daily.    Marland Kitchen FLUoxetine (PROZAC) 40 MG capsule Take 1 capsule (40 mg total) by mouth daily. 90 capsule 3  . ondansetron (ZOFRAN) 4 MG tablet Take 1 tablet (4 mg total) by mouth every 8 (eight) hours as needed for nausea. 8 tablet 5  . promethazine-dextromethorphan (PROMETHAZINE-DM) 6.25-15 MG/5ML syrup Take 5 mLs by mouth 4 (four) times daily as needed. 118 mL 0  . valACYclovir (VALTREX) 1000 MG tablet Take 1 tablet (1,000 mg total) by mouth 3 (three) times daily. (Patient taking differently: Take 1,000 mg by mouth See admin instructions. Takes three times a day for breakouts) 90 tablet 1  . vitamin B-12 (CYANOCOBALAMIN) 1000 MCG tablet Take 1,000 mcg by mouth daily.     No current facility-administered medications for this visit.     PHYSICAL EXAMINATION: ECOG PERFORMANCE STATUS: 1 - Symptomatic but completely ambulatory  Vitals:   11/17/17 1319  BP: 131/73  Pulse: 68  Resp: 18  Temp: 97.8 F (36.6 C)  SpO2: 100%   Filed Weights   11/17/17 1319  Weight: 244 lb (110.7 kg)    GENERAL:alert, no distress and comfortable SKIN: skin color, texture, turgor are normal, no rashes or significant lesions EYES: normal, Conjunctiva are pink and non-injected, sclera clear OROPHARYNX:no exudate, no erythema and lips, buccal mucosa, and tongue normal  NECK: supple, thyroid normal size, non-tender,  without nodularity LYMPH:  no palpable lymphadenopathy in the cervical, axillary or inguinal LUNGS: clear to auscultation and percussion with normal breathing effort HEART: regular rate & rhythm and no murmurs and no lower extremity edema ABDOMEN:abdomen soft, non-tender and normal bowel sounds Musculoskeletal:no cyanosis of digits and no clubbing  NEURO: alert & oriented x 3 with fluent speech, no focal motor/sensory deficits  LABORATORY DATA:  I have reviewed the data as listed    Component Value Date/Time   NA 142 11/17/2017 1247   NA 141 11/18/2016 0754   K 3.2 (L) 11/17/2017 1247   K 4.0 11/18/2016 0754   CL 104 11/17/2017 1247   CO2 30 11/17/2017 1247   CO2 25 11/18/2016 0754   GLUCOSE 120 (H) 11/17/2017 1247   GLUCOSE 90 11/18/2016 0754   BUN 9 11/17/2017 1247   BUN 14.1 11/18/2016 0754   CREATININE 0.82 11/17/2017 1247   CREATININE 0.66 05/13/2017 1507   CREATININE 0.8 11/18/2016 0754   CALCIUM 9.1 11/17/2017 1247   CALCIUM 9.5 11/18/2016 0754   PROT 6.8 11/17/2017 1247   PROT 6.7 11/18/2016 0754   ALBUMIN 3.5 11/17/2017 1247   ALBUMIN  3.5 11/18/2016 0754   AST 18 11/17/2017 1247   AST 14 11/18/2016 0754   ALT 18 11/17/2017 1247   ALT 19 11/18/2016 0754   ALKPHOS 175 (H) 11/17/2017 1247   ALKPHOS 143 11/18/2016 0754   BILITOT 0.7 11/17/2017 1247   BILITOT 0.46 11/18/2016 0754   GFRNONAA >60 11/17/2017 1247   GFRAA >60 11/17/2017 1247    No results found for: SPEP, UPEP  Lab Results  Component Value Date   WBC 7.3 11/17/2017   NEUTROABS 5.2 11/17/2017   HGB 12.6 11/17/2017   HCT 38.4 11/17/2017   MCV 83.9 11/17/2017   PLT 294 11/17/2017      Chemistry      Component Value Date/Time   NA 142 11/17/2017 1247   NA 141 11/18/2016 0754   K 3.2 (L) 11/17/2017 1247   K 4.0 11/18/2016 0754   CL 104 11/17/2017 1247   CO2 30 11/17/2017 1247   CO2 25 11/18/2016 0754   BUN 9 11/17/2017 1247   BUN 14.1 11/18/2016 0754   CREATININE 0.82 11/17/2017 1247    CREATININE 0.66 05/13/2017 1507   CREATININE 0.8 11/18/2016 0754      Component Value Date/Time   CALCIUM 9.1 11/17/2017 1247   CALCIUM 9.5 11/18/2016 0754   ALKPHOS 175 (H) 11/17/2017 1247   ALKPHOS 143 11/18/2016 0754   AST 18 11/17/2017 1247   AST 14 11/18/2016 0754   ALT 18 11/17/2017 1247   ALT 19 11/18/2016 0754   BILITOT 0.7 11/17/2017 1247   BILITOT 0.46 11/18/2016 0754      All questions were answered. The patient knows to call the clinic with any problems, questions or concerns. No barriers to learning was detected.  I spent 15 minutes counseling the patient face to face. The total time spent in the appointment was 20 minutes and more than 50% was on counseling and review of test results  Heath Lark, MD 11/17/2017 2:19 PM

## 2017-11-17 NOTE — Assessment & Plan Note (Signed)
She has history of Barrett's esophagus status post treatment She needs to establish with a local gastroenterologist for further management and surveillance in the future

## 2017-11-17 NOTE — Assessment & Plan Note (Signed)
Examination is unremarkable for disease recurrence I do not feel strongly we need to pursue repeat CT imaging in the absence of symptoms The patient is more than 4 years out, a long-term cancer survivor She is relocating to [facility] I recommend her to establish oncologist once she is settled in her new home.

## 2017-11-17 NOTE — Assessment & Plan Note (Signed)
She has history of vitamin B12 deficiency Serum vitamin B12 is pending We will call the patient with results and instruction

## 2017-11-17 NOTE — Assessment & Plan Note (Signed)
She has history of iron deficiency anemia due to chronic GI bleed from Barrett's esophagus Even though she is not anemic, she is mildly iron deficient We will call the patient to resume oral iron supplement

## 2017-11-17 NOTE — Assessment & Plan Note (Signed)
She is taking Prozac She was also prescribed some anxiolytics We discussed coping strategy and exercise as tolerated

## 2017-11-18 ENCOUNTER — Telehealth: Payer: Self-pay

## 2017-11-18 NOTE — Telephone Encounter (Signed)
-----   Message from Heath Lark, MD sent at 11/18/2017  7:20 AM EDT ----- Regarding: mildly reduced oral iron Her final labs are back. A bit minor reduced iron compared to last time and reduced potassium. Recommend potassium rich diet and to take in iron rich diet. She needs iron rechecked with her new primary doctor in a few months

## 2017-11-18 NOTE — Telephone Encounter (Signed)
Called and left below message. Instructed to call nurse back if needed or for questions.

## 2017-11-25 ENCOUNTER — Ambulatory Visit: Payer: Managed Care, Other (non HMO) | Admitting: Hematology and Oncology

## 2017-11-25 ENCOUNTER — Other Ambulatory Visit: Payer: Managed Care, Other (non HMO)

## 2017-11-30 ENCOUNTER — Encounter: Payer: Self-pay | Admitting: *Deleted

## 2018-02-21 NOTE — Telephone Encounter (Signed)
Error opening  

## 2019-04-25 ENCOUNTER — Other Ambulatory Visit: Payer: Self-pay

## 2019-04-26 ENCOUNTER — Other Ambulatory Visit: Payer: Self-pay

## 2019-04-27 ENCOUNTER — Ambulatory Visit (INDEPENDENT_AMBULATORY_CARE_PROVIDER_SITE_OTHER): Payer: Self-pay | Admitting: Family Medicine

## 2019-04-27 ENCOUNTER — Other Ambulatory Visit: Payer: Self-pay

## 2019-04-27 ENCOUNTER — Encounter: Payer: Self-pay | Admitting: Family Medicine

## 2019-04-27 VITALS — BP 138/98 | HR 75 | Temp 97.2°F | Resp 18 | Ht 68.0 in | Wt 207.8 lb

## 2019-04-27 DIAGNOSIS — I10 Essential (primary) hypertension: Secondary | ICD-10-CM

## 2019-04-27 DIAGNOSIS — F411 Generalized anxiety disorder: Secondary | ICD-10-CM

## 2019-04-27 DIAGNOSIS — F410 Panic disorder [episodic paroxysmal anxiety] without agoraphobia: Secondary | ICD-10-CM

## 2019-04-27 MED ORDER — FLUOXETINE HCL 20 MG PO TABS: 20.0000 mg | ORAL_TABLET | Freq: Every day | ORAL | 3 refills | Status: DC

## 2019-04-27 MED ORDER — HYDROCHLOROTHIAZIDE 25 MG PO TABS: 25.0000 mg | ORAL_TABLET | Freq: Every day | ORAL | 3 refills | Status: DC

## 2019-04-27 MED ORDER — ALPRAZOLAM 0.25 MG PO TABS: 0.2500 mg | ORAL_TABLET | Freq: Two times a day (BID) | ORAL | 0 refills | Status: DC | PRN

## 2019-04-27 NOTE — Assessment & Plan Note (Signed)
Start hctz qd Dash diet  Recheck 1 month

## 2019-04-27 NOTE — Progress Notes (Signed)
Patient ID: Anna Jackson, female    DOB: 05/25/1960  Age: 59 y.o. MRN: [id number]    Subjective:  Subjective  HPI Anna Jackson presents for anxiety and panic attacks  She just moved back from [place] , [place] Review of Systems  Constitutional: Negative for appetite change, diaphoresis, fatigue and unexpected weight change.  Eyes: Negative for pain, redness and visual disturbance.  Respiratory: Negative for cough, chest tightness, shortness of breath and wheezing.   Cardiovascular: Negative for chest pain, palpitations and leg swelling.  Endocrine: Negative for cold intolerance, heat intolerance, polydipsia, polyphagia and polyuria.  Genitourinary: Negative for difficulty urinating, dysuria and frequency.  Neurological: Negative for dizziness, light-headedness, numbness and headaches.  Psychiatric/Behavioral: Positive for decreased concentration. Negative for agitation, behavioral problems, confusion, dysphoric mood, hallucinations, self-injury and sleep disturbance. The patient is nervous/anxious. The patient is not hyperactive.     History Past Medical History:  Diagnosis Date  . Adenopathy    left axillary  . Anemia   . Anxiety   . Anxiety state, unspecified   . Asthma    no problem with asthma in several years  . Barrett esophagus   . Depression   . Esophageal reflux   . Headache 05/16/2013  . Herpes simplex without mention of complication   . Hyperlipidemia 03/12/2014  . Hypertension   . Lymphoma, T-cell ([place]) 03/20/2013  . Mental disorder   . Neuropathic pain 04/04/2014  . Non Hodgkin's lymphoma ([place]) 2015  . Skin infection 11/02/2013  . Unspecified asthma(493.90)   . Unspecified essential hypertension   . URI (upper respiratory infection) 01/15/2014    She has a past surgical history that includes Cholecystectomy (1995); Nissen fundoplication (Q000111Q); Cervical conization w/bx; Tubal ligation; Knee surgery (Right, 02/2010); Axillary lymph node dissection (Left,  03/09/2013); Portacath placement (N/A, 03/23/2013); Breast biopsy (Left, 02/13/2013); Breast excisional biopsy (Left, 03/09/2013); Esophageal manometry (N/A, 01/12/2017); Incisional hernia repair (2004); and Vaginal hysterectomy (2002).   Her family history includes Alcoholism in her brother; Breast cancer (age of onset: 38) in her maternal grandmother; Cancer (age of onset: 62) in her maternal grandmother; Coronary artery disease in an other family member; Heart disease in her brother and mother; Hypertension in an other family member; Migraines in an other family member.She reports that she has never smoked. She has never used smokeless tobacco. She reports current alcohol use. She reports that she does not use drugs.  Current Outpatient Medications on File Prior to Visit  Medication Sig Dispense Refill  . Cholecalciferol (VITAMIN D) 2000 units tablet Take 2,000 Units by mouth daily.    . valACYclovir (VALTREX) 1000 MG tablet Take 1 tablet (1,000 mg total) by mouth 3 (three) times daily. (Patient taking differently: Take 1,000 mg by mouth See admin instructions. Takes three times a day for breakouts) 90 tablet 1  . vitamin B-12 (CYANOCOBALAMIN) 1000 MCG tablet Take 1,000 mcg by mouth daily.    Marland Kitchen albuterol (PROAIR HFA) 108 (90 Base) MCG/ACT inhaler 2 puffs q6h prn (Patient not taking: Reported on 04/27/2019) 1 Inhaler 2  . ALPRAZolam (XANAX) 0.25 MG tablet Take 1 tablet (0.25 mg total) by mouth 2 (two) times daily as needed for anxiety. (Patient not taking: Reported on 04/27/2019) 20 tablet 0  . beclomethasone (QVAR REDIHALER) 40 MCG/ACT inhaler Inhale 2 puffs into the lungs 2 (two) times daily. (Patient not taking: Reported on 04/27/2019) 10.6 g 12  . cetirizine (ZYRTEC) 10 MG tablet Take 1 tablet (10 mg total) by mouth daily. (Patient not taking:  Reported on 04/27/2019) 30 tablet 11   No current facility-administered medications on file prior to visit.     Objective:  Objective  Physical Exam Vitals  and nursing note reviewed.  Constitutional:      Appearance: She is well-developed.  HENT:     Head: Normocephalic and atraumatic.  Eyes:     Conjunctiva/sclera: Conjunctivae normal.  Neck:     Thyroid: No thyromegaly.     Vascular: No carotid bruit or JVD.  Cardiovascular:     Rate and Rhythm: Normal rate and regular rhythm.     Heart sounds: Normal heart sounds. No murmur.  Pulmonary:     Effort: Pulmonary effort is normal. No respiratory distress.     Breath sounds: Normal breath sounds. No wheezing or rales.  Chest:     Chest wall: No tenderness.  Musculoskeletal:     Cervical back: Normal range of motion and neck supple.  Neurological:     Mental Status: She is alert and oriented to person, place, and time.  Psychiatric:        Attention and Perception: Attention normal.        Mood and Affect: Mood is anxious. Mood is not depressed or elated. Affect is not labile, blunt, flat, angry, tearful or inappropriate.        Speech: Speech normal.        Behavior: Behavior normal.        Thought Content: Thought content normal.        Cognition and Memory: Cognition normal.    BP (!) 138/98 (BP Location: Right Arm, Patient Position: Sitting, Cuff Size: Normal)   Pulse 75   Temp (!) 97.2 F (36.2 C) (Temporal)   Resp 18   Ht 5\' 8"  (1.727 m)   Wt 207 lb 12.8 oz (94.3 kg)   SpO2 99%   BMI 31.60 kg/m  Wt Readings from Last 3 Encounters:  04/27/19 207 lb 12.8 oz (94.3 kg)  11/17/17 244 lb (110.7 kg)  06/02/17 245 lb 12.8 oz (111.5 kg)     Lab Results  Component Value Date   WBC 7.3 11/17/2017   HGB 12.6 11/17/2017   HCT 38.4 11/17/2017   PLT 294 11/17/2017   GLUCOSE 120 (H) 11/17/2017   CHOL 173 05/13/2017   TRIG 134 05/13/2017   HDL 61 05/13/2017   LDLDIRECT 105.0 11/23/2010   LDLCALC 89 05/13/2017   ALT 18 11/17/2017   AST 18 11/17/2017   NA 142 11/17/2017   K 3.2 (L) 11/17/2017   CL 104 11/17/2017   CREATININE 0.82 11/17/2017   BUN 9 11/17/2017   CO2 30  11/17/2017   TSH 0.39 06/02/2017   INR 0.99 03/29/2013    DG Chest 2 View  Result Date: 06/02/2017 CLINICAL DATA:  Chronic cough. EXAM: CHEST - 2 VIEW COMPARISON:  Radiographs of June 14, 2016. FINDINGS: The heart size and mediastinal contours are within normal limits. Both lungs are clear. No pneumothorax or pleural effusion is noted. The visualized skeletal structures are unremarkable. IMPRESSION: No active cardiopulmonary disease. Electronically Signed   By: Marijo Conception, M.D.   On: 06/02/2017 15:16     Assessment & Plan:  Plan  I have discontinued Karem C. Kincer's ondansetron, promethazine-dextromethorphan, and FLUoxetine. I am also having her start on FLUoxetine, ALPRAZolam, and hydrochlorothiazide. Additionally, I am having her maintain her vitamin B-12, Vitamin D, valACYclovir, albuterol, beclomethasone, ALPRAZolam, and cetirizine.  Meds ordered this encounter  Medications  . FLUoxetine (PROZAC) 20  MG tablet    Sig: Take 1 tablet (20 mg total) by mouth daily.    Dispense:  30 tablet    Refill:  3  . ALPRAZolam (XANAX) 0.25 MG tablet    Sig: Take 1 tablet (0.25 mg total) by mouth 2 (two) times daily as needed for anxiety.    Dispense:  20 tablet    Refill:  0  . hydrochlorothiazide (HYDRODIURIL) 25 MG tablet    Sig: Take 1 tablet (25 mg total) by mouth daily.    Dispense:  90 tablet    Refill:  3    Problem List Items Addressed This Visit      Unprioritized   Anxiety state    Restart prozac  F/u 4 weeks      Relevant Medications   FLUoxetine (PROZAC) 20 MG tablet   ALPRAZolam (XANAX) 0.25 MG tablet   Essential hypertension    Start hctz qd Dash diet  Recheck 1 month       Relevant Medications   hydrochlorothiazide (HYDRODIURIL) 25 MG tablet   Other Relevant Orders   TSH   Lipid panel   Comprehensive metabolic panel   CBC with Differential/Platelet    Other Visit Diagnoses    Severe anxiety with panic    -  Primary   Relevant Medications    FLUoxetine (PROZAC) 20 MG tablet   ALPRAZolam (XANAX) 0.25 MG tablet      Follow-up: Return in about 4 weeks (around 05/25/2019), or if symptoms worsen or fail to improve, for anxiety.  Ann Held, DO

## 2019-04-27 NOTE — Assessment & Plan Note (Signed)
Restart prozac  F/u 4 weeks

## 2019-04-27 NOTE — Patient Instructions (Signed)

## 2019-04-28 LAB — CBC WITH DIFFERENTIAL/PLATELET
Absolute Monocytes: 475 cells/uL (ref 200–950)
Basophils Absolute: 52 cells/uL (ref 0–200)
Basophils Relative: 0.8 %
Eosinophils Absolute: 130 cells/uL (ref 15–500)
Eosinophils Relative: 2 %
HCT: 39.2 % (ref 35.0–45.0)
Hemoglobin: 13.3 g/dL (ref 11.7–15.5)
Lymphs Abs: 2360 cells/uL (ref 850–3900)
MCH: 29.2 pg (ref 27.0–33.0)
MCHC: 33.9 g/dL (ref 32.0–36.0)
MCV: 86.2 fL (ref 80.0–100.0)
MPV: 10.5 fL (ref 7.5–12.5)
Monocytes Relative: 7.3 %
Neutro Abs: 3484 cells/uL (ref 1500–7800)
Neutrophils Relative %: 53.6 %
Platelets: 280 10*3/uL (ref 140–400)
RBC: 4.55 10*6/uL (ref 3.80–5.10)
RDW: 13.3 % (ref 11.0–15.0)
Total Lymphocyte: 36.3 %
WBC: 6.5 10*3/uL (ref 3.8–10.8)

## 2019-04-28 LAB — COMPREHENSIVE METABOLIC PANEL
AG Ratio: 1.8 (calc) (ref 1.0–2.5)
ALT: 13 U/L (ref 6–29)
AST: 12 U/L (ref 10–35)
Albumin: 4.1 g/dL (ref 3.6–5.1)
Alkaline phosphatase (APISO): 143 U/L (ref 37–153)
BUN: 11 mg/dL (ref 7–25)
CO2: 27 mmol/L (ref 20–32)
Calcium: 9.4 mg/dL (ref 8.6–10.4)
Chloride: 102 mmol/L (ref 98–110)
Creat: 0.79 mg/dL (ref 0.50–1.05)
Globulin: 2.3 g/dL (calc) (ref 1.9–3.7)
Glucose, Bld: 83 mg/dL (ref 65–99)
Potassium: 3.7 mmol/L (ref 3.5–5.3)
Sodium: 139 mmol/L (ref 135–146)
Total Bilirubin: 0.7 mg/dL (ref 0.2–1.2)
Total Protein: 6.4 g/dL (ref 6.1–8.1)

## 2019-04-28 LAB — LIPID PANEL
Cholesterol: 181 mg/dL (ref <200)
HDL: 71 mg/dL (ref >=50)
LDL Cholesterol (Calc): 90 mg/dL (calc)
Non-HDL Cholesterol (Calc): 110 mg/dL (calc) (ref <130)
Total CHOL/HDL Ratio: 2.5 (calc) (ref <5.0)
Triglycerides: 106 mg/dL (ref <150)

## 2019-04-28 LAB — TSH: TSH: 1.6 mIU/L (ref 0.40–4.50)

## 2019-05-24 ENCOUNTER — Other Ambulatory Visit: Payer: Self-pay

## 2019-05-25 ENCOUNTER — Encounter: Payer: Self-pay | Admitting: Family Medicine

## 2019-05-25 ENCOUNTER — Other Ambulatory Visit: Payer: Self-pay

## 2019-05-25 ENCOUNTER — Ambulatory Visit (INDEPENDENT_AMBULATORY_CARE_PROVIDER_SITE_OTHER): Payer: Self-pay | Admitting: Family Medicine

## 2019-05-25 VITALS — BP 120/80 | HR 165 | Temp 97.0°F | Resp 18 | Ht 68.0 in | Wt 207.4 lb

## 2019-05-25 DIAGNOSIS — F419 Anxiety disorder, unspecified: Secondary | ICD-10-CM

## 2019-05-25 MED ORDER — FLUOXETINE HCL 40 MG PO CAPS: 40.0000 mg | ORAL_CAPSULE | Freq: Every day | ORAL | 3 refills | Status: DC

## 2019-05-25 NOTE — Patient Instructions (Signed)
Depression Screening Depression screening is a tool that your health care provider can use to learn if you have symptoms of depression. Depression is a common condition with many symptoms that are also often found in other conditions. Depression is treatable, but it must first be diagnosed. You may not know that certain feelings, thoughts, and behaviors that you are having can be symptoms of depression. Taking a depression screening test can help you and your health care provider decide if you need more assessment, or if you should be referred to a mental health care provider. What are the screening tests?  You may have a physical exam to see if another condition is affecting your mental health. You may have a blood or urine sample taken during the physical exam.  You may be interviewed using a screening tool that was developed from research, such as one of these: ? Patient Health Questionnaire (PHQ). This is a set of either 2 or 9 questions. A health care provider who has been trained to score this screening test uses a guide to assess if your symptoms suggest that you may have depression. ? Hamilton Depression Rating Scale (HAM-D). This is a set of either 17 or 24 questions. You may be asked to take it again during or after your treatment, to see if your depression has gotten better. ? Beck Depression Inventory (BDI). This is a set of 21 multiple choice questions. Your health care provider scores your answers to assess:  Your level of depression, ranging from mild to severe.  Your response to treatment.  Your health care provider may talk with you about your daily activities, such as eating, sleeping, work, and recreation, and ask if you have had any changes in activity.  Your health care provider may ask you to see a mental health specialist, such as a psychiatrist or psychologist, for more evaluation. Who should be screened for depression?   All adults, including adults with a family history  of a mental health disorder.  Adolescents who are 12-18 years old.  People who are recovering from a myocardial infarction (MI).  Pregnant women, or women who have given birth.  People who have a long-term (chronic) illness.  Anyone who has been diagnosed with another type of a mental health disorder.  Anyone who has symptoms that could show depression. What do my results mean? Your health care provider will review the results of your depression screening, physical exam, and lab tests. Positive screens suggest that you may have depression. Screening is the first step in getting the care that you may need. It is up to you to get your screening results. Ask your health care provider, or the department that is doing your screening tests, when your results will be ready. Talk with your health care provider about your results and diagnosis. A diagnosis of depression is made using the Diagnostic and Statistical Manual of Mental Disorders (DSM-V). This is a book that lists the number and type of symptoms that must be present for a health care provider to give a specific diagnosis.  Your health care provider may work with you to treat your symptoms of depression, or your health care provider may help you find a mental health provider who can assess, diagnose, and treat your depression. Get help right away if:  You have thoughts about hurting yourself or others. If you ever feel like you may hurt yourself or others, or have thoughts about taking your own life, get help right away. You   can go to your nearest emergency department or call:  Your local emergency services (911 in the [place]  A suicide crisis helpline, such as the National Suicide Prevention Lifeline at 1-800-273-8255. This is open 24 hours a day. Summary  Depression screening is the first step in getting the help that you may need.  If your screening test shows symptoms of depression (is positive), your health care provider may ask  you to see a mental health provider.  Anyone who is age 12 or older should be screened for depression. This information is not intended to replace advice given to you by your health care provider. Make sure you discuss any questions you have with your health care provider. Document Revised: 01/14/2017 Document Reviewed: 06/18/2016 Elsevier Patient Education  2020 [employer]  

## 2019-05-25 NOTE — Progress Notes (Signed)
Patient ID: Anna Jackson, female    DOB: [birth year]  Age: 59 y.o. MRN: [id number]    Subjective:  Subjective  HPI KERRI-ANN WILDES presents for f/u anxiety    She is doing well with meds.    Review of Systems  Constitutional: Negative for appetite change, diaphoresis, fatigue and unexpected weight change.  Eyes: Negative for pain, redness and visual disturbance.  Respiratory: Negative for cough, chest tightness, shortness of breath and wheezing.   Cardiovascular: Negative for chest pain, palpitations and leg swelling.  Endocrine: Negative for cold intolerance, heat intolerance, polydipsia, polyphagia and polyuria.  Genitourinary: Negative for difficulty urinating, dysuria and frequency.  Neurological: Negative for dizziness, light-headedness, numbness and headaches.  Psychiatric/Behavioral: The patient is nervous/anxious.     History Past Medical History:  Diagnosis Date  . Adenopathy    left axillary  . Anemia   . Anxiety   . Anxiety state, unspecified   . Asthma    no problem with asthma in several years  . Barrett esophagus   . Depression   . Esophageal reflux   . Headache 05/16/2013  . Herpes simplex without mention of complication   . Hyperlipidemia 03/12/2014  . Hypertension   . 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 Mental disorder   . Neuropathic pain 04/04/2014  . Non Hodgkin's lymphoma ([employer]) 2015  . Skin infection 11/02/2013  . Unspecified asthma(493.90)   . Unspecified essential hypertension   . URI (upper respiratory infection) 01/15/2014    She has a past surgical history that includes Cholecystectomy (1995); Nissen fundoplication ([id number]); Cervical conization w/bx; Tubal ligation; Knee surgery (Right, 02/2010); Axillary lymph node dissection (Left, 03/09/2013); Portacath placement (N/A, 03/23/2013); Breast biopsy (Left, 02/13/2013); Breast excisional biopsy (Left, 03/09/2013); Esophageal manometry (N/A, 01/12/2017); Incisional hernia repair (2004); and Vaginal  hysterectomy (2002).   Her family history includes Alcoholism in her brother; Breast cancer (age of onset: 35) in her maternal grandmother; Cancer (age of onset: 12) in her maternal grandmother; Coronary artery disease in an other family member; Heart disease in her brother and mother; Hypertension in an other family member; Migraines in an other family member.She reports that she has never smoked. She has never used smokeless tobacco. She reports current alcohol use. She reports that she does not use drugs.  Current Outpatient Medications on File Prior to Visit  Medication Sig Dispense Refill  . ALPRAZolam (XANAX) 0.25 MG tablet Take 1 tablet (0.25 mg total) by mouth 2 (two) times daily as needed for anxiety. 20 tablet 0  . cetirizine (ZYRTEC) 10 MG tablet Take 1 tablet (10 mg total) by mouth daily. 30 tablet 11  . Cholecalciferol (VITAMIN D) 2000 units tablet Take 2,000 Units by mouth daily.    . hydrochlorothiazide (HYDRODIURIL) 25 MG tablet Take 1 tablet (25 mg total) by mouth daily. 90 tablet 3  . valACYclovir (VALTREX) 1000 MG tablet Take 1 tablet (1,000 mg total) by mouth 3 (three) times daily. (Patient taking differently: Take 1,000 mg by mouth See admin instructions. Takes three times a day for breakouts) 90 tablet 1  . vitamin B-12 (CYANOCOBALAMIN) 1000 MCG tablet Take 1,000 mcg by mouth daily.     No current facility-administered medications on file prior to visit.     Objective:  Objective  Physical Exam Vitals and nursing note reviewed.  Neurological:     Gait: Gait normal.  Psychiatric:        Attention and Perception: Attention normal.        Mood and  Affect: Mood is not anxious or depressed.        Speech: Speech normal.        Behavior: Behavior is uncooperative.        Cognition and Memory: Cognition normal.    BP 120/80 (BP Location: Right Arm, Patient Position: Sitting, Cuff Size: Normal)   Pulse (!) 165   Temp (!) 97 F (36.1 C) (Temporal)   Resp 18   Ht 5\' 8"   (1.727 m)   Wt 207 lb 6.4 oz (94.1 kg)   SpO2 99%   BMI 31.54 kg/m  Wt Readings from Last 3 Encounters:  05/25/19 207 lb 6.4 oz (94.1 kg)  04/27/19 207 lb 12.8 oz (94.3 kg)  11/17/17 244 lb (110.7 kg)     Lab Results  Component Value Date   WBC 6.5 04/27/2019   HGB 13.3 04/27/2019   HCT 39.2 04/27/2019   PLT 280 04/27/2019   GLUCOSE 83 04/27/2019   CHOL 181 04/27/2019   TRIG 106 04/27/2019   HDL 71 04/27/2019   LDLDIRECT 105.0 11/23/2010   LDLCALC 90 04/27/2019   ALT 13 04/27/2019   AST 12 04/27/2019   NA 139 04/27/2019   K 3.7 04/27/2019   CL 102 04/27/2019   CREATININE 0.79 04/27/2019   BUN 11 04/27/2019   CO2 27 04/27/2019   TSH 1.60 04/27/2019   INR 0.99 03/29/2013    DG Chest 2 View  Result Date: 06/02/2017 CLINICAL DATA:  Chronic cough. EXAM: CHEST - 2 VIEW COMPARISON:  Radiographs of June 14, 2016. FINDINGS: The heart size and mediastinal contours are within normal limits. Both lungs are clear. No pneumothorax or pleural effusion is noted. The visualized skeletal structures are unremarkable. IMPRESSION: No active cardiopulmonary disease. Electronically Signed   By: Marijo Conception, M.D.   On: 06/02/2017 15:16     Assessment & Plan:  Plan  I have discontinued Jaleiah C. Cange's albuterol, beclomethasone, and FLUoxetine. I am also having her start on FLUoxetine. Additionally, I am having her maintain her vitamin B-12, Vitamin D, valACYclovir, cetirizine, ALPRAZolam, and hydrochlorothiazide.  Meds ordered this encounter  Medications  . FLUoxetine (PROZAC) 40 MG capsule    Sig: Take 1 capsule (40 mg total) by mouth daily.    Dispense:  90 capsule    Refill:  3    Problem List Items Addressed This Visit    None    Visit Diagnoses    Anxiety    -  Primary   Relevant Medications   FLUoxetine (PROZAC) 40 MG capsule    pt doing well but would like to inc the dose and go back on 40 mg like she was previously  F/u 3 months for cpe   Follow-up: Return  in about 3 months (around 08/24/2019), or if symptoms worsen or fail to improve, for fasting, annual exam.  Ann Held, DO

## 2019-06-12 ENCOUNTER — Other Ambulatory Visit: Payer: Self-pay | Admitting: Family Medicine

## 2019-06-12 DIAGNOSIS — F410 Panic disorder [episodic paroxysmal anxiety] without agoraphobia: Secondary | ICD-10-CM

## 2019-06-12 MED ORDER — ALPRAZOLAM 0.25 MG PO TABS: 0.2500 mg | ORAL_TABLET | Freq: Two times a day (BID) | ORAL | 0 refills | Status: DC | PRN

## 2019-06-12 NOTE — Telephone Encounter (Signed)
Alprazolam refill.   Last OV: 05/25/19 Last Fill: 04/27/2019 #20 and 0RF Pt sig: 1 tab bid prn UDS: 06/02/2017 Low risk

## 2019-07-26 ENCOUNTER — Encounter: Payer: Self-pay | Admitting: Family Medicine

## 2019-07-27 ENCOUNTER — Emergency Department (HOSPITAL_COMMUNITY): Payer: Managed Care, Other (non HMO)

## 2019-07-27 ENCOUNTER — Encounter (HOSPITAL_COMMUNITY): Payer: Self-pay | Admitting: Emergency Medicine

## 2019-07-27 ENCOUNTER — Other Ambulatory Visit: Payer: Self-pay

## 2019-07-27 ENCOUNTER — Observation Stay (HOSPITAL_COMMUNITY)
Admission: EM | Admit: 2019-07-27 | Discharge: 2019-07-28 | Disposition: A | Discharge status: Home / Self Care | Payer: Managed Care, Other (non HMO) | Attending: Family Medicine | Admitting: Family Medicine

## 2019-07-27 DIAGNOSIS — Z6831 Body mass index (BMI) 31.0-31.9, adult: Secondary | ICD-10-CM | POA: Insufficient documentation

## 2019-07-27 DIAGNOSIS — K219 Gastro-esophageal reflux disease without esophagitis: Secondary | ICD-10-CM | POA: Insufficient documentation

## 2019-07-27 DIAGNOSIS — R079 Chest pain, unspecified: Principal | ICD-10-CM | POA: Diagnosis present

## 2019-07-27 DIAGNOSIS — Z885 Allergy status to narcotic agent status: Secondary | ICD-10-CM | POA: Insufficient documentation

## 2019-07-27 DIAGNOSIS — Z8572 Personal history of non-Hodgkin lymphomas: Secondary | ICD-10-CM | POA: Diagnosis not present

## 2019-07-27 DIAGNOSIS — Z79899 Other long term (current) drug therapy: Secondary | ICD-10-CM | POA: Insufficient documentation

## 2019-07-27 DIAGNOSIS — Z8579 Personal history of other malignant neoplasms of lymphoid, hematopoietic and related tissues: Secondary | ICD-10-CM | POA: Diagnosis not present

## 2019-07-27 DIAGNOSIS — E669 Obesity, unspecified: Secondary | ICD-10-CM | POA: Diagnosis not present

## 2019-07-27 DIAGNOSIS — E876 Hypokalemia: Secondary | ICD-10-CM | POA: Diagnosis not present

## 2019-07-27 DIAGNOSIS — R9431 Abnormal electrocardiogram [ECG] [EKG]: Secondary | ICD-10-CM

## 2019-07-27 DIAGNOSIS — Z20822 Contact with and (suspected) exposure to covid-19: Secondary | ICD-10-CM | POA: Diagnosis not present

## 2019-07-27 DIAGNOSIS — Z8249 Family history of ischemic heart disease and other diseases of the circulatory system: Secondary | ICD-10-CM | POA: Insufficient documentation

## 2019-07-27 DIAGNOSIS — I10 Essential (primary) hypertension: Secondary | ICD-10-CM | POA: Diagnosis not present

## 2019-07-27 DIAGNOSIS — E785 Hyperlipidemia, unspecified: Secondary | ICD-10-CM | POA: Insufficient documentation

## 2019-07-27 DIAGNOSIS — J45909 Unspecified asthma, uncomplicated: Secondary | ICD-10-CM | POA: Insufficient documentation

## 2019-07-27 DIAGNOSIS — F329 Major depressive disorder, single episode, unspecified: Secondary | ICD-10-CM | POA: Diagnosis not present

## 2019-07-27 DIAGNOSIS — F419 Anxiety disorder, unspecified: Secondary | ICD-10-CM | POA: Insufficient documentation

## 2019-07-27 LAB — CBC
HCT: 40.5 % (ref 36.0–46.0)
Hemoglobin: 13.1 g/dL (ref 12.0–15.0)
MCH: 29 pg (ref 26.0–34.0)
MCHC: 32.3 g/dL (ref 30.0–36.0)
MCV: 89.6 fL (ref 80.0–100.0)
Platelets: 278 10*3/uL (ref 150–400)
RBC: 4.52 MIL/uL (ref 3.87–5.11)
RDW: 13 % (ref 11.5–15.5)
WBC: 6.8 10*3/uL (ref 4.0–10.5)
nRBC: 0 % (ref 0.0–0.2)

## 2019-07-27 LAB — BASIC METABOLIC PANEL
Anion gap: 11 (ref 5–15)
BUN: 11 mg/dL (ref 6–20)
CO2: 28 mmol/L (ref 22–32)
Calcium: 9.1 mg/dL (ref 8.9–10.3)
Chloride: 100 mmol/L (ref 98–111)
Creatinine, Ser: 0.74 mg/dL (ref 0.44–1.00)
GFR calc Af Amer: >60 mL/min (ref >60)
GFR calc non Af Amer: >60 mL/min (ref >60)
Glucose, Bld: 89 mg/dL (ref 70–99)
Potassium: 3 mmol/L — ABNORMAL LOW (ref 3.5–5.1)
Sodium: 139 mmol/L (ref 135–145)

## 2019-07-27 LAB — SARS CORONAVIRUS 2 BY RT PCR (HOSPITAL ORDER, PERFORMED IN ~~LOC~~ HOSPITAL LAB): SARS Coronavirus 2: NEGATIVE

## 2019-07-27 LAB — TROPONIN I (HIGH SENSITIVITY)
Troponin I (High Sensitivity): 8 ng/L (ref <18)
Troponin I (High Sensitivity): 8 ng/L (ref <18)

## 2019-07-27 LAB — HIV ANTIBODY (ROUTINE TESTING W REFLEX): HIV Screen 4th Generation wRfx: NONREACTIVE

## 2019-07-27 LAB — I-STAT BETA HCG BLOOD, ED (MC, WL, AP ONLY): I-stat hCG, quantitative: <5 m[IU]/mL (ref <5)

## 2019-07-27 MED ORDER — VITAMIN D 25 MCG (1000 UNIT) PO TABS
2000.0000 [IU] | ORAL_TABLET | Freq: Every day | ORAL | Status: DC
  Administered 2019-07-28: 2000 [IU] via ORAL
  Filled 2019-07-27: qty 2

## 2019-07-27 MED ORDER — VITAMIN B-12 1000 MCG PO TABS
1000.0000 ug | ORAL_TABLET | Freq: Every day | ORAL | Status: DC
  Administered 2019-07-28: 1000 ug via ORAL
  Filled 2019-07-27: qty 1

## 2019-07-27 MED ORDER — ENOXAPARIN SODIUM 40 MG/0.4ML ~~LOC~~ SOLN
40.0000 mg | SUBCUTANEOUS | Status: DC
  Filled 2019-07-27: qty 0.4

## 2019-07-27 MED ORDER — HYDROCHLOROTHIAZIDE 25 MG PO TABS
25.0000 mg | ORAL_TABLET | Freq: Every day | ORAL | Status: DC
  Administered 2019-07-28: 25 mg via ORAL
  Filled 2019-07-27: qty 1

## 2019-07-27 MED ORDER — ALPRAZOLAM 0.25 MG PO TABS: 0.2500 mg | ORAL_TABLET | Freq: Two times a day (BID) | ORAL | Status: DC | PRN

## 2019-07-27 MED ORDER — POTASSIUM CHLORIDE CRYS ER 20 MEQ PO TBCR
40.0000 meq | EXTENDED_RELEASE_TABLET | Freq: Once | ORAL | Status: AC
  Administered 2019-07-27: 40 meq via ORAL
  Filled 2019-07-27: qty 2

## 2019-07-27 MED ORDER — ONDANSETRON HCL 4 MG/2ML IJ SOLN: 4.0000 mg | Freq: Four times a day (QID) | INTRAMUSCULAR | Status: DC | PRN

## 2019-07-27 MED ORDER — ACETAMINOPHEN 325 MG PO TABS
650.0000 mg | ORAL_TABLET | ORAL | Status: DC | PRN
  Administered 2019-07-28: 650 mg via ORAL
  Filled 2019-07-27: qty 2

## 2019-07-27 MED ORDER — SODIUM CHLORIDE 0.9% FLUSH
3.0000 mL | Freq: Once | INTRAVENOUS | Status: AC
  Administered 2019-07-28: 3 mL via INTRAVENOUS

## 2019-07-27 MED ORDER — DOCUSATE SODIUM 100 MG PO CAPS
200.0000 mg | ORAL_CAPSULE | Freq: Every morning | ORAL | Status: DC
  Administered 2019-07-28: 200 mg via ORAL
  Filled 2019-07-27: qty 2

## 2019-07-27 MED ORDER — FLUOXETINE HCL 20 MG PO CAPS
40.0000 mg | ORAL_CAPSULE | Freq: Every day | ORAL | Status: DC
  Administered 2019-07-28: 40 mg via ORAL
  Filled 2019-07-27: qty 2

## 2019-07-27 NOTE — ED Triage Notes (Signed)
Patient arrives to ED with complaints of sharp left chest pain that radiated to her back that started on Tuesday and then again this morning. Patient states she's been nauseous and dizzy this week as well. Patient denies SOB.

## 2019-07-27 NOTE — Consult Note (Signed)
Cardiology Consultation:   Patient ID: [name] MRN: [id number]; DOB: [birth year]  Admit date: 07/27/2019 Date of Consult: 07/27/2019  Primary Care Provider: Carollee Herter, [place] Cardiologist: No primary care provider on file. New, Dr. Margaretann Loveless [facility] Electrophysiologist:  None    Patient Profile:   Anna Jackson is a 59 y.o. female with a hx of non-Hodgkins lymphoma, hypertension, hyperlipidemia who is being seen today for the evaluation of chest pain at the request of Rodell Perna PA-C.  History of Present Illness:   Anna Jackson is a pleasant 59 year old female with the above-mentioned history presents for 3 episodes of chest pain in the past week.  She tells me that on Tuesday of this week she had an episode of chest discomfort that started on the left side of her chest and radiated around to her back.  Chest pain woke her up from sleep.  She had nausea, diaphoresis, lightheadedness.  Pain was continuous for most of the day.  It occurred again on Wednesday, and it concerned her but not enough to come to the ER because she thought it was not consistent with a heart attack.  She had 2 days of relief and today while climbing the stairs had a recurrence of chest pain on the left chest that did not radiate to her back.  The pain really concerned her today and prompted her presentation to the ER.  She has a strong family history of coronary artery disease including her mother who passed away at a young age of a "massive heart attack" in her 41s.  Currently infrequent.  She denies significant dyspnea on exertion, PND, orthopnea, leg swelling, syncope, lightheadedness.  She denies smoking history, denies alcohol use.  Works for [employer].  Pertinent labs include sodium 139, potassium 3, creatinine 0.74, troponin high-sensitivity 8> 8, hearing 0.1, white blood cell count 6.8, platelet count 278.  Cholesterol panel from March total cholesterol 181, HDL 71,  LDL 90, triglycerides 106.  Telemetry: Sinus rhythm rate 65 ECG: Sinus rhythm, LVH  Past Medical History:  Diagnosis Date  . Adenopathy    left axillary  . Anemia   . Anxiety   . Anxiety state, unspecified   . Asthma    no problem with asthma in several years  . Barrett esophagus   . Depression   . Esophageal reflux   . Headache 05/16/2013  . Herpes simplex without mention of complication   . Hyperlipidemia 03/12/2014  . Hypertension   . 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 Mental disorder   . Neuropathic pain 04/04/2014  . Non Hodgkin's lymphoma ([place]) 2015  . Skin infection 11/02/2013  . Unspecified asthma(493.90)   . Unspecified essential hypertension   . URI (upper respiratory infection) 01/15/2014    Past Surgical History:  Procedure Laterality Date  . AXILLARY LYMPH NODE DISSECTION Left 03/09/2013   Procedure: EXCISION LEFT AXILLARY LYMPH NODES;  Surgeon: [name], MD;  Location: [facility];  Service: General;  Laterality: Left;  . BREAST BIOPSY Left 02/13/2013   high risk  . BREAST EXCISIONAL BIOPSY Left 03/09/2013   high risk  . CERVICAL CONIZATION W/BX    . CHOLECYSTECTOMY  1995   Open, done with Hiatal hernia repair  . ESOPHAGEAL MANOMETRY N/A 01/12/2017   Procedure: ESOPHAGEAL MANOMETRY (EM);  Surgeon: Doran Stabler, MD;  Location: [facility];  Service: Gastroenterology;  Laterality: N/A;  . INCISIONAL HERNIA REPAIR  2004   Dr Nedra Hai.  Parietex 20x15cm mesh  . KNEE SURGERY Right 02/2010   Dr Shellia Carwin, Arthroscopic  . NISSEN FUNDOPLICATION  9030   OPEN, Dr Lennie Hummer  . PORTACATH PLACEMENT N/A 03/23/2013   Procedure: INSERTION PORT-A-CATH;  Surgeon: [name], MD;  Location: [facility];  Service: General;  Laterality: N/A;  . TUBAL LIGATION    . VAGINAL HYSTERECTOMY  2002   Dr Toni Amend [place] Medications:  Prior to Admission medications   Medication Sig Start Date End Date Taking? Authorizing Provider  ALPRAZolam (XANAX) 0.25 MG  tablet Take 1 tablet (0.25 mg total) by mouth 2 (two) times daily as needed for anxiety. 06/12/19  Yes Ann Held, DO  aspirin 325 MG EC tablet Take 650 mg by mouth as needed (for onset of chest pain).   Yes [provider]  Cholecalciferol (VITAMIN D3 SUPER STRENGTH) 50 MCG (2000 UT) CAPS Take 2,000 Units by mouth daily.   Yes [provider]  docusate sodium (COLACE) 100 MG capsule Take 200 mg by mouth in the morning.   Yes [provider]  FLUoxetine (PROZAC) 40 MG capsule Take 1 capsule (40 mg total) by mouth daily. 05/25/19  Yes Roma Schanz R, DO  hydrochlorothiazide (HYDRODIURIL) 25 MG tablet Take 1 tablet (25 mg total) by mouth daily. 04/27/19  Yes Roma Schanz R, DO  NON FORMULARY Take 1 tablet by mouth See admin instructions. Nature Made Super C Immune Complex tablets- Take 1 tablet by mouth once a day   Yes [provider]  vitamin B-12 (CYANOCOBALAMIN) 1000 MCG tablet Take 1,000 mcg by mouth daily.   Yes [provider]  valACYclovir (VALTREX) 1000 MG tablet Take 1 tablet (1,000 mg total) by mouth 3 (three) times daily. Patient not taking: Reported on 07/27/2019 01/28/17   Ann Held, DO    Inpatient Medications: Scheduled Meds: . [START ON 07/28/2019] cholecalciferol  2,000 Units Oral Daily  . [START ON 07/28/2019] docusate sodium  200 mg Oral q AM  . enoxaparin (LOVENOX) injection  40 mg Subcutaneous Q24H  . [START ON 07/28/2019] FLUoxetine  40 mg Oral Daily  . [START ON 07/28/2019] hydrochlorothiazide  25 mg Oral Daily  . sodium chloride flush  3 mL Intravenous Once  . [START ON 07/28/2019] vitamin B-12  1,000 mcg Oral Daily   Continuous Infusions:  PRN Meds: acetaminophen, ALPRAZolam, ondansetron (ZOFRAN) IV  Allergies:    Allergies  Allergen Reactions  . Betadine [Povidone Iodine] Hives and Itching  . Codeine Nausea And Vomiting    Social History:   Social History   Socioeconomic History  .  Marital status: Single    Spouse name: Not on file  . Number of children: 1  . Years of education: Not on file  . Highest education level: Not on file  Occupational History  . Occupation: RECEIVING Armed forces operational officer: [employer]  Tobacco Use  . Smoking status: Never Smoker  . Smokeless tobacco: Never Used  Vaping Use  . Vaping Use: Never used  Substance and Sexual Activity  . Alcohol use: Yes    Comment: occasional  . Drug use: No  . Sexual activity: Not Currently    Partners: Male  Other Topics Concern  . Not on file  Social History Narrative   Exercise-- walking   Social Determinants of Health   Financial Resource Strain:   . Difficulty of Paying Living Expenses:   Food Insecurity:   .  Worried About Charity fundraiser in the Last Year:   . Arboriculturist in the Last Year:   Transportation Needs:   . Film/video editor (Medical):   Marland Kitchen Lack of Transportation (Non-Medical):   Physical Activity:   . Days of Exercise per Week:   . Minutes of Exercise per Session:   Stress:   . Feeling of Stress :   Social Connections:   . Frequency of Communication with Friends and Family:   . Frequency of Social Gatherings with Friends and Family:   . Attends Religious Services:   . Active Member of Clubs or Organizations:   . Attends Archivist Meetings:   Marland Kitchen Marital Status:   Intimate Partner Violence:   . Fear of Current or Ex-Partner:   . Emotionally Abused:   Marland Kitchen Physically Abused:   . Sexually Abused:     Family History:    Family History  Problem Relation Age of Onset  . Heart disease Mother   . Coronary artery disease Other        with hernia repair  . Migraines Other   . Hypertension Other   . Breast cancer Maternal Grandmother 62  . Cancer Maternal Grandmother 50       breast  . Heart disease Brother   . Alcoholism Brother   . Colon cancer Neg Hx      ROS:  Please see the history of present illness.   All other ROS reviewed and negative.       Physical Exam/Data:   Vitals:   07/27/19 1933 07/27/19 1939 07/27/19 1945 07/27/19 1948  BP:  (!) 144/80 (!) 145/81   Pulse: (!) 56 (!) 59 (!) 56 (!) 53  Resp: 20 17 18 18   Temp:      TempSrc:      SpO2: 100% 100% 100% 100%  Weight:      Height:       No intake or output data in the 24 hours ending 07/27/19 2205 Last 3 Weights 07/27/2019 05/25/2019 04/27/2019  Weight (lbs) 207 lb 207 lb 6.4 oz 207 lb 12.8 oz  Weight (kg) 93.895 kg 94.076 kg 94.257 kg     Body mass index is 31.47 kg/m.  General:  Well nourished, well developed, in no acute distress HEENT: normal Neck: no JVD Endocrine:  No thryomegaly Vascular: No carotid bruits; FA pulses 2+ bilaterally without bruits  Cardiac:  normal S1, S2; RRR; no murmur  Lungs:  clear to auscultation bilaterally, no wheezing, rhonchi or rales  Abd: soft, nontender, no hepatomegaly  Ext: no edema Musculoskeletal:  No deformities, BUE and BLE strength normal and equal Skin: warm and dry  Neuro:  CNs 2-12 intact, no focal abnormalities noted Psych:  Normal affect   Relevant CV Studies:   Laboratory Data:  High Sensitivity Troponin:   Recent Labs  Lab 07/27/19 1221 07/27/19 1815  TROPONINIHS 8 8     Chemistry Recent Labs  Lab 07/27/19 1221  NA 139  K 3.0*  CL 100  CO2 28  GLUCOSE 89  BUN 11  CREATININE 0.74  CALCIUM 9.1  GFRNONAA >60  GFRAA >60  ANIONGAP 11    No results for input(s): PROT, ALBUMIN, AST, ALT, ALKPHOS, BILITOT in the last 168 hours. Hematology Recent Labs  Lab 07/27/19 1221  WBC 6.8  RBC 4.52  HGB 13.1  HCT 40.5  MCV 89.6  MCH 29.0  MCHC 32.3  RDW 13.0  PLT 278  BNPNo results for input(s): BNP, PROBNP in the last 168 hours.  DDimer No results for input(s): DDIMER in the last 168 hours.   Radiology/Studies:  DG Chest 2 View  Result Date: 07/27/2019 CLINICAL DATA:  Chest pain for 2 days at, history lymphoma, asthma, hypertension EXAM: CHEST - 2 VIEW COMPARISON:  06/02/2017 FINDINGS:  Normal heart size, mediastinal contours, and pulmonary vascularity. Atherosclerotic calcification aorta. Lungs clear. No infiltrate, pleural effusion, or pneumothorax. Bones demineralized with scattered endplate spur formation thoracic spine. Surgical clips LEFT axilla, perigastric, and RIGHT upper quadrant. IMPRESSION: No acute abnormalities. Aortic Atherosclerosis (ICD10-I70.0). Electronically Signed   By: Lavonia Dana M.D.   On: 07/27/2019 12:46    Assessment and Plan:   Principal Problem:   Chest pain, rule out acute myocardial infarction Active Problems:   Essential hypertension   History of lymphoma  She has several concerning episodes of chest pain with typical angina characteristics including left-sided chest pain worsened by exertion.  I have independently reviewed the images from her PET scan obtained in 2015, no definite coronary artery calcifications are noted.  She does have a history of hypertension and hyperlipidemia increasing her risk for coronary artery disease.  And evaluate for coronary artery disease is warranted, and with her body habitus nuclear stress test may have breast attenuation artifact without the ability to do upright imaging.  Therefore coronary CTA for evaluation would be the best test, and this can be performed tomorrow.  Fortunately the patient has a well-controlled heart rate in sinus rhythm.  Patient is also curious if her chemotherapy agents could have located her cardiovascular health.  It would be reasonable to obtain an echocardiogram to evaluate myocardial function with strain imaging post chemotherapy.  Would also be helpful to evaluate for pericardial effusion and pericarditis.  Can continue hydrochlorothiazide for hypertension.  Echocardiogram and CCTA pending tomorrow.      For questions or updates, please contact [place] Please consult [email or web address] for contact info under    Signed, Elouise Munroe, MD  07/27/2019 10:05 PM

## 2019-07-27 NOTE — ED Provider Notes (Signed)
[facility] EMERGENCY DEPARTMENT Provider Note   CSN: [id number] Arrival date & time: 07/27/19  1159     History Chief Complaint  Patient presents with  . Chest Pain    Anna Jackson is a 59 y.o. female with history of non-Hodgkin's lymphoma in remission, hypertension, hyperlipidemia, asthma, anxiety presenting for evaluation of acute onset, intermittent chest pain.  Reports that symptoms began 3 days ago upon awakening from sleep.  At that time she experienced dull chest pain along the left side of the chest radiating under the breast into the back.  It was associated with nausea, diaphoresis, lightheadedness.  Denies syncope, shortness of breath, loss of consciousness, abdominal pain.  She reports the pain lasted all day with no aggravating or alleviating factors but resolved after taking 2 full size aspirin.  She reports that she was asymptomatic yesterday and the day before.  Today at around 10:30 AM while she was walking around she developed similar pains but states that they were less severe and were not associated with any other symptoms.  This pain lasted for approximately 2 hours but then resolved after taking 2 full size aspirin.  She is a non-smoker, denies recreational drug use.  She reports strong family history of heart disease at a young age including her mother who passed away of a "massive heart attack" in her 72s.  She reports that she had a stress test several years ago and to her knowledge it was normal.  She denies leg swelling, recent travel or surgeries, hemoptysis, prior history of DVT or PE, or hormone replacement therapy.  The history is provided by the patient.       Past Medical History:  Diagnosis Date  . Adenopathy    left axillary  . Anemia   . Anxiety   . Anxiety state, unspecified   . Asthma    no problem with asthma in several years  . Barrett esophagus   . Depression   . Esophageal reflux   . Headache 05/16/2013  . Herpes  simplex without mention of complication   . Hyperlipidemia 03/12/2014  . Hypertension   . 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 Mental disorder   . Neuropathic pain 04/04/2014  . Non Hodgkin's lymphoma ([place]) 2015  . Skin infection 11/02/2013  . Unspecified asthma(493.90)   . Unspecified essential hypertension   . URI (upper respiratory infection) 01/15/2014    Patient Active Problem List   Diagnosis Date Noted  . Chest pain, rule out acute myocardial infarction 07/27/2019  . Recurrent hiatal hernia s/p robotic PEH repair/Nissen 03/16/2017 03/16/2017  . Hiatal hernia   . Iron deficiency anemia 07/16/2016  . Acute bronchitis 06/14/2016  . Vitamin B12 deficiency 10/10/2014  . Neuropathic pain 04/04/2014  . Hyperlipidemia 03/12/2014  . Obesity (BMI 30-39.9) 01/08/2014  . 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 Headache 05/16/2013  . History of lymphoma 03/20/2013  . Barrett's esophagus 08/10/2010  . Heme positive stool 06/30/2010  . Depression 03/04/2009  . GERD 12/08/2007  . Anemia in neoplastic disease 12/08/2007  . POSTMENOPAUSAL STATUS 11/21/2007  . Anxiety state 05/09/2007  . Pain in joint, lower leg 10/20/2006  . HERPES SIMPLEX, UNCOMPLICATED 65/04/5463  . ONYCHOMYCOSIS, TOENAILS 05/27/2006  . Essential hypertension 04/26/2006  . Asthma 04/26/2006  . HEADACHE 04/26/2006  . UMBILICAL HERNIORRHAPHY, HX OF 04/26/2006    Past Surgical History:  Procedure Laterality Date  . AXILLARY LYMPH NODE DISSECTION Left 03/09/2013   Procedure: EXCISION LEFT  AXILLARY LYMPH NODES;  Surgeon: [name], MD;  [facility];  Service: General;  Laterality: Left;  . BREAST BIOPSY Left 02/13/2013   high risk  . BREAST EXCISIONAL BIOPSY Left 03/09/2013   high risk  . CERVICAL CONIZATION W/BX    . CHOLECYSTECTOMY  1995   Open, done with Hiatal hernia repair  . ESOPHAGEAL MANOMETRY N/A 01/12/2017   Procedure: ESOPHAGEAL MANOMETRY (EM);  Surgeon: Doran Stabler,  MD;  [facility];  Service: Gastroenterology;  Laterality: N/A;  . INCISIONAL HERNIA REPAIR  2004   Dr Nedra Hai.  Parietex 20x15cm mesh  . KNEE SURGERY Right 02/2010   Dr Shellia Carwin, Arthroscopic  . NISSEN FUNDOPLICATION  [id number]   OPEN, Dr Lennie Hummer  . PORTACATH PLACEMENT N/A 03/23/2013   Procedure: INSERTION PORT-A-CATH;  Surgeon: [name], MD;  Location: [facility];  Service: General;  Laterality: N/A;  . TUBAL LIGATION    . VAGINAL HYSTERECTOMY  2002   Dr Toni Amend Raphael Gibney     OB History   No obstetric history on file.     Family History  Problem Relation Age of Onset  . Heart disease Mother   . Coronary artery disease Other        with hernia repair  . Migraines Other   . Hypertension Other   . Breast cancer Maternal Grandmother 31  . Cancer Maternal Grandmother 50       breast  . Heart disease Brother   . Alcoholism Brother   . Colon cancer Neg Hx     Social History   Tobacco Use  . Smoking status: Never Smoker  . Smokeless tobacco: Never Used  Vaping Use  . Vaping Use: Never used  Substance Use Topics  . Alcohol use: Yes    Comment: occasional  . Drug use: No    Home Medications Prior to Admission medications   Medication Sig Start Date End Date Taking? Authorizing Provider  ALPRAZolam (XANAX) 0.25 MG tablet Take 1 tablet (0.25 mg total) by mouth 2 (two) times daily as needed for anxiety. 06/12/19  Yes Ann Held, DO  aspirin 325 MG EC tablet Take 650 mg by mouth as needed (for onset of chest pain).   Yes [provider]  Cholecalciferol (VITAMIN D3 SUPER STRENGTH) 50 MCG (2000 UT) CAPS Take 2,000 Units by mouth daily.   Yes [provider]  docusate sodium (COLACE) 100 MG capsule Take 200 mg by mouth in the morning.   Yes [provider]  FLUoxetine (PROZAC) 40 MG capsule Take 1 capsule (40 mg total) by mouth daily. 05/25/19  Yes Roma Schanz R, DO  hydrochlorothiazide (HYDRODIURIL) 25 MG tablet Take 1  tablet (25 mg total) by mouth daily. 04/27/19  Yes Roma Schanz R, DO  NON FORMULARY Take 1 tablet by mouth See admin instructions. Nature Made Super C Immune Complex tablets- Take 1 tablet by mouth once a day   Yes [provider]  vitamin B-12 (CYANOCOBALAMIN) 1000 MCG tablet Take 1,000 mcg by mouth daily.   Yes [provider]  valACYclovir (VALTREX) 1000 MG tablet Take 1 tablet (1,000 mg total) by mouth 3 (three) times daily. Patient not taking: Reported on 07/27/2019 01/28/17   Ann Held, DO    Allergies    Betadine [povidone iodine] and Codeine  Review of Systems   Review of Systems  Constitutional: Positive for diaphoresis (resolved). Negative for chills and fever.  Respiratory: Negative  for shortness of breath.   Cardiovascular: Positive for chest pain. Negative for leg swelling.  Gastrointestinal: Positive for nausea (resolved). Negative for abdominal pain and vomiting.  Neurological: Positive for light-headedness (resolved). Negative for syncope.  All other systems reviewed and are negative.   Physical Exam Updated Vital Signs BP (!) 145/81   Pulse (!) 53   Temp 97.8 F (36.6 C) (Oral)   Resp 18   Ht 5\' 8"  (1.727 m)   Wt 93.9 kg   SpO2 100%   BMI 31.47 kg/m   Physical Exam Vitals and nursing note reviewed.  Constitutional:      General: She is not in acute distress.    Appearance: She is well-developed.     Comments: Resting comfortably in bed  HENT:     Head: Normocephalic and atraumatic.  Eyes:     General:        Right eye: No discharge.        Left eye: No discharge.     Conjunctiva/sclera: Conjunctivae normal.  Neck:     Vascular: No JVD.     Trachea: No tracheal deviation.  Cardiovascular:     Rate and Rhythm: Normal rate and regular rhythm.     Pulses:          Radial pulses are 2+ on the right side and 2+ on the left side.       Dorsalis pedis pulses are 2+ on the right side and 2+ on the left side.        Posterior tibial pulses are 2+ on the right side and 2+ on the left side.     Heart sounds: Normal heart sounds.     Comments: Homans sign absent bilaterally, no lower extremity edema, no palpable cords, compartments are soft  Pulmonary:     Effort: Pulmonary effort is normal.     Breath sounds: Normal breath sounds.  Abdominal:     General: There is no distension.     Palpations: Abdomen is soft.     Tenderness: There is no abdominal tenderness.  Musculoskeletal:     Right lower leg: No tenderness. No edema.     Left lower leg: No tenderness. No edema.  Skin:    General: Skin is warm and dry.     Findings: No erythema.  Neurological:     Mental Status: She is alert.  Psychiatric:        Behavior: Behavior normal.     ED Results / Procedures / Treatments   Labs (all labs ordered are listed, but only abnormal results are displayed) Labs Reviewed  BASIC METABOLIC PANEL - Abnormal; Notable for the following components:      Result Value   Potassium 3.0 (*)    All other components within normal limits  SARS CORONAVIRUS 2 BY RT PCR (HOSPITAL ORDER, [facility])  CBC  HIV ANTIBODY (ROUTINE TESTING W REFLEX)  I-STAT BETA HCG BLOOD, ED ([facility], [facility], [facility] ONLY)  TROPONIN I (HIGH SENSITIVITY)  TROPONIN I (HIGH SENSITIVITY)    EKG EKG Interpretation  Date/Time:  Friday July 27 2019 11:59:33 EDT Ventricular Rate:  78 PR Interval:  180 QRS Duration: 92 QT Interval:  428 QTC Calculation: 487 R Axis:   -13 Text Interpretation: Normal sinus rhythm Moderate voltage criteria for LVH, may be normal variant ( R in aVL , Cornell product ) Possible Lateral infarct , age undetermined Abnormal ECG Confirmed by Gerlene Fee [phone]) on 07/27/2019 7:33:29 PM  Radiology DG Chest 2 View  Result Date: 07/27/2019 CLINICAL DATA:  Chest pain for 2 days at, history lymphoma, asthma, hypertension EXAM: CHEST - 2 VIEW COMPARISON:  06/02/2017 FINDINGS: Normal heart size,  mediastinal contours, and pulmonary vascularity. Atherosclerotic calcification aorta. Lungs clear. No infiltrate, pleural effusion, or pneumothorax. Bones demineralized with scattered endplate spur formation thoracic spine. Surgical clips LEFT axilla, perigastric, and RIGHT upper quadrant. IMPRESSION: No acute abnormalities. Aortic Atherosclerosis (ICD10-I70.0). Electronically Signed   By: Lavonia Dana M.D.   On: 07/27/2019 12:46    Procedures Procedures (including critical care time)  Medications Ordered in ED Medications  sodium chloride flush (NS) 0.9 % injection 3 mL (has no administration in time range)  acetaminophen (TYLENOL) tablet 650 mg (has no administration in time range)  ondansetron (ZOFRAN) injection 4 mg (has no administration in time range)  enoxaparin (LOVENOX) injection 40 mg (has no administration in time range)  ALPRAZolam (XANAX) tablet 0.25 mg (has no administration in time range)  hydrochlorothiazide (HYDRODIURIL) tablet 25 mg (has no administration in time range)  vitamin B-12 (CYANOCOBALAMIN) tablet 1,000 mcg (has no administration in time range)  FLUoxetine (PROZAC) capsule 40 mg (has no administration in time range)  cholecalciferol (VITAMIN D3) tablet 2,000 Units (has no administration in time range)  docusate sodium (COLACE) capsule 200 mg (has no administration in time range)  potassium chloride SA (KLOR-CON) CR tablet 40 mEq (40 mEq Oral Given 07/27/19 1937)    ED Course  I have reviewed the triage vital signs and the nursing notes.  Pertinent labs & imaging results that were available during my care of the patient were reviewed by me and considered in my medical decision making (see chart for details).    MDM Rules/Calculators/A&P                          Patient presenting for evaluation of intermittent chest pains for the last few days.  Pain resolves with aspirin.  It is left-sided and has been associated with nausea, lightheadedness, diaphoresis.  She  has a strong family history of heart disease and she has a HEAR score of 5.  She is afebrile, vital signs are stable in the ED.  She is nontoxic in appearance.  She is currently chest pain-free.  Pain is not reproducible on palpation nor is it pleuritic in nature.  There is potentially an exertional component to her symptoms.  EKG shows evidence of LVH, possible lateral infarct.  Serial troponins are negative and stable.  Remainder of lab work reviewed and interpreted by myself shows no leukocytosis, no anemia, no renal insufficiency.  She is hypokalemic, replenished orally in the ED.  She is on HCTZ which could be contributing to her hypokalemia.  Chest x-ray shows no acute cardiopulmonary abnormalities.   CONSULT: Spoke with Dr. Margaretann Loveless with cardiology service.  She recommends admitting the patient to the hospitalist service for overnight observation with plan for cardiology to assess the patient emergently in the ED.  She also recommends obtaining a repeat EKG to ensure that the abnormality in the lateral leads has not progressed.  Plan for likely echocardiogram and CT coronary in the morning.  On reevaluation patient resting comfortably in no apparent distress.  She continues to be chest pain-free.  She is in agreement with admission to the hospital.  Spoke with Dr. Alcario Drought with [facility] hospitalist service who agrees to assume care of patient and bring her into the hospital for further evaluation  and management. Final Clinical Impression(s) / ED Diagnoses Final diagnoses:  Left-sided chest pain  Abnormal EKG    Rx / DC Orders ED Discharge Orders    None       Debroah Baller 07/27/19 2235    Maudie Flakes, MD 07/27/19 2355

## 2019-07-27 NOTE — H&P (Signed)
History and Physical    Anna Jackson EXN:[id number] DOB: Jun 17, 1960 DOA: 07/27/2019  PCP: Ann Held, DO  Patient coming from: Home  I have personally briefly reviewed patient's old medical records in [place]  Chief Complaint: CP  HPI: Anna Jackson is a 59 y.o. female with medical history significant of NHL in remission, barrett esophagus, anxiety.  Pt presents to the ED with c/o intermittent CP.  Onset 3 days ago.  CP located on left side of chest, radiation under L breast and into back.  Associated with nausea, diaphoresis, lightheadedness.  Pain lasted all day, finally resolved after 2 full ASA.  Next 2 days was asymptomatic.  Then today around 10:30 developed recurrent CP while walking.  Lasted 2h, resolved after taking 2 full strength ASA.  Came to ED.  Mother died of MI in her 26s.   ED Course: Trop neg x2.  Cards consulted, hospitalist asked to admit.   Review of Systems: As per HPI, otherwise all review of systems negative.  Past Medical History:  Diagnosis Date  . Adenopathy    left axillary  . Anemia   . Anxiety   . Anxiety state, unspecified   . Asthma    no problem with asthma in several years  . Barrett esophagus   . Depression   . Esophageal reflux   . Headache 05/16/2013  . Herpes simplex without mention of complication   . Hyperlipidemia 03/12/2014  . Hypertension   . 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 Mental disorder   . Neuropathic pain 04/04/2014  . Non Hodgkin's lymphoma ([place]) 2015  . Skin infection 11/02/2013  . Unspecified asthma(493.90)   . Unspecified essential hypertension   . URI (upper respiratory infection) 01/15/2014    Past Surgical History:  Procedure Laterality Date  . AXILLARY LYMPH NODE DISSECTION Left 03/09/2013   Procedure: EXCISION LEFT AXILLARY LYMPH NODES;  Surgeon: [name], MD;  [facility];  Service: General;  Laterality: Left;  . BREAST BIOPSY Left 02/13/2013   high risk  .  BREAST EXCISIONAL BIOPSY Left 03/09/2013   high risk  . CERVICAL CONIZATION W/BX    . CHOLECYSTECTOMY  1995   Open, done with Hiatal hernia repair  . ESOPHAGEAL MANOMETRY N/A 01/12/2017   Procedure: ESOPHAGEAL MANOMETRY (EM);  Surgeon: Doran Stabler, MD;  [facility];  Service: Gastroenterology;  Laterality: N/A;  . INCISIONAL HERNIA REPAIR  2004   Dr Nedra Hai.  Parietex 20x15cm mesh  . KNEE SURGERY Right 02/2010   Dr Shellia Carwin, Arthroscopic  . NISSEN FUNDOPLICATION  [id number]   OPEN, Dr Lennie Hummer  . PORTACATH PLACEMENT N/A 03/23/2013   Procedure: INSERTION PORT-A-CATH;  Surgeon: [name], MD;  Location: [facility];  Service: General;  Laterality: N/A;  . TUBAL LIGATION    . VAGINAL HYSTERECTOMY  2002   Dr Art Raphael Gibney     reports that she has never smoked. She has never used smokeless tobacco. She reports current alcohol use. She reports that she does not use drugs.  Allergies  Allergen Reactions  . Betadine [Povidone Iodine] Hives and Itching  . Codeine Nausea And Vomiting    Family History  Problem Relation Age of Onset  . Heart disease Mother   . Coronary artery disease Other        with hernia repair  . Migraines Other   . Hypertension Other   . Breast cancer Maternal Grandmother 31  . Cancer Maternal Grandmother  50       breast  . Heart disease Brother   . Alcoholism Brother   . Colon cancer Neg Hx      Prior to Admission medications   Medication Sig Start Date End Date Taking? Authorizing Provider  ALPRAZolam (XANAX) 0.25 MG tablet Take 1 tablet (0.25 mg total) by mouth 2 (two) times daily as needed for anxiety. 06/12/19  Yes Ann Held, DO  aspirin 325 MG EC tablet Take 650 mg by mouth as needed (for onset of chest pain).   Yes [provider]  Cholecalciferol (VITAMIN D3 SUPER STRENGTH) 50 MCG (2000 UT) CAPS Take 2,000 Units by mouth daily.   Yes [provider]  docusate sodium (COLACE) 100 MG capsule Take 200 mg  by mouth in the morning.   Yes [provider]  FLUoxetine (PROZAC) 40 MG capsule Take 1 capsule (40 mg total) by mouth daily. 05/25/19  Yes Roma Schanz R, DO  hydrochlorothiazide (HYDRODIURIL) 25 MG tablet Take 1 tablet (25 mg total) by mouth daily. 04/27/19  Yes Roma Schanz R, DO  NON FORMULARY Take 1 tablet by mouth See admin instructions. Nature Made Super C Immune Complex tablets- Take 1 tablet by mouth once a day   Yes [provider]  vitamin B-12 (CYANOCOBALAMIN) 1000 MCG tablet Take 1,000 mcg by mouth daily.   Yes [provider]  valACYclovir (VALTREX) 1000 MG tablet Take 1 tablet (1,000 mg total) by mouth 3 (three) times daily. Patient not taking: Reported on 07/27/2019 01/28/17   Ann Held, DO    Physical Exam: Vitals:   07/27/19 1933 07/27/19 1939 07/27/19 1945 07/27/19 1948  BP:  (!) 144/80 (!) 145/81   Pulse: (!) 56 (!) 59 (!) 56 (!) 53  Resp: 20 17 18 18   Temp:      TempSrc:      SpO2: 100% 100% 100% 100%  Weight:      Height:        Constitutional: NAD, calm, comfortable Eyes: PERRL, lids and conjunctivae normal ENMT: Mucous membranes are moist. Posterior pharynx clear of any exudate or lesions.Normal dentition.  Neck: normal, supple, no masses, no thyromegaly Respiratory: clear to auscultation bilaterally, no wheezing, no crackles. Normal respiratory effort. No accessory muscle use.  Cardiovascular: Regular rate and rhythm, no murmurs / rubs / gallops. No extremity edema. 2+ pedal pulses. No carotid bruits.  Abdomen: no tenderness, no masses palpated. No hepatosplenomegaly. Bowel sounds positive.  Musculoskeletal: no clubbing / cyanosis. No joint deformity upper and lower extremities. Good ROM, no contractures. Normal muscle tone.  Skin: no rashes, lesions, ulcers. No induration Neurologic: CN 2-12 grossly intact. Sensation intact, DTR normal. Strength 5/5 in all 4.  Psychiatric: Normal judgment and insight. Alert  and oriented x 3. Normal mood.    Labs on Admission: I have personally reviewed following labs and imaging studies  CBC: Recent Labs  Lab 07/27/19 1221  WBC 6.8  HGB 13.1  HCT 40.5  MCV 89.6  PLT 761   Basic Metabolic Panel: Recent Labs  Lab 07/27/19 1221  NA 139  K 3.0*  CL 100  CO2 28  GLUCOSE 89  BUN 11  CREATININE 0.74  CALCIUM 9.1   GFR: Estimated Creatinine Clearance: 94.1 mL/min (by C-G formula based on SCr of 0.74 mg/dL). Liver Function Tests: No results for input(s): AST, ALT, ALKPHOS, BILITOT, PROT, ALBUMIN in the last 168 hours. No results for input(s): LIPASE, AMYLASE in the last  168 hours. No results for input(s): AMMONIA in the last 168 hours. Coagulation Profile: No results for input(s): INR, PROTIME in the last 168 hours. Cardiac Enzymes: No results for input(s): CKTOTAL, CKMB, CKMBINDEX, TROPONINI in the last 168 hours. BNP (last 3 results) No results for input(s): PROBNP in the last 8760 hours. HbA1C: No results for input(s): HGBA1C in the last 72 hours. CBG: No results for input(s): GLUCAP in the last 168 hours. Lipid Profile: No results for input(s): CHOL, HDL, LDLCALC, TRIG, CHOLHDL, LDLDIRECT in the last 72 hours. Thyroid Function Tests: No results for input(s): TSH, T4TOTAL, FREET4, T3FREE, THYROIDAB in the last 72 hours. Anemia Panel: No results for input(s): VITAMINB12, FOLATE, FERRITIN, TIBC, IRON, RETICCTPCT in the last 72 hours. Urine analysis:    Component Value Date/Time   COLORURINE lt. yellow 12/05/2007 1251   APPEARANCEUR Clear 12/05/2007 1251   LABSPEC 1.030 06/11/2013 1559   PHURINE 6.0 06/11/2013 1559   PHURINE 5.5 12/05/2007 1251   GLUCOSEU Negative 06/11/2013 1559   HGBUR Moderate 06/11/2013 1559   HGBUR large 12/05/2007 1251   BILIRUBINUR negative 08/20/2015 0949   BILIRUBINUR Negative 06/11/2013 1559   KETONESUR Negative 06/11/2013 1559   PROTEINUR 1+ 08/20/2015 0949   PROTEINUR Negative 06/11/2013 1559    UROBILINOGEN 0.2 08/20/2015 0949   UROBILINOGEN 0.2 06/11/2013 1559   NITRITE positive 08/20/2015 0949   NITRITE Negative 06/11/2013 1559   NITRITE negative 12/05/2007 1251   LEUKOCYTESUR large (3+) (A) 08/20/2015 0949   LEUKOCYTESUR Negative 06/11/2013 1559    Radiological Exams on Admission: DG Chest 2 View  Result Date: 07/27/2019 CLINICAL DATA:  Chest pain for 2 days at, history lymphoma, asthma, hypertension EXAM: CHEST - 2 VIEW COMPARISON:  06/02/2017 FINDINGS: Normal heart size, mediastinal contours, and pulmonary vascularity. Atherosclerotic calcification aorta. Lungs clear. No infiltrate, pleural effusion, or pneumothorax. Bones demineralized with scattered endplate spur formation thoracic spine. Surgical clips LEFT axilla, perigastric, and RIGHT upper quadrant. IMPRESSION: No acute abnormalities. Aortic Atherosclerosis (ICD10-I70.0). Electronically Signed   By: Lavonia Dana M.D.   On: 07/27/2019 12:46    EKG: Independently reviewed.  Assessment/Plan Principal Problem:   Chest pain, rule out acute myocardial infarction Active Problems:   Essential hypertension   History of lymphoma    1. CP r/o - 1. CP obs pathway 2. Tele monitor 3. NPO after MN 4. Cards consulted by EDP ordered CT coronary and 2d echo 2. HTN - 1. Cont home BP meds 3. Hypokalemia - 1. K replaced in ED 4. H/o lymphoma - in remission  DVT prophylaxis: Lovenox Code Status: Full Family Communication: No family in room Disposition Plan: Home after CP work up completed Consults called: Cards consulted by EDP Admission status: Place in [place]    Hollis Tuller, [place] Hospitalists  How to contact the [facility] Attending or Consulting provider [place] or covering provider during after hours [place], for this patient?  1. Check the care team in [facility] and look for a) attending/consulting [facility] provider listed and b) the [facility] team listed 2. Log into [email or web address]  Amion Physician Scheduling and messaging for  groups and whole hospitals  On call and physician scheduling software for group practices, residents, hospitalists and other medical providers for call, clinic, rotation and shift schedules. On[employer] is a hospital-wide system for scheduling doctors and paging doctors on call. EasyPlot is for scientific plotting and data analysis.  [email or web address]  and use [place]'s universal password to access. If you do not have the  password, please contact the hospital operator.  3. Locate the [facility] provider you are looking for under [facility] and page to a number that you can be directly reached. 4. If you still have difficulty reaching the provider, please page the [facility] (Director on Call) for the Hospitalists listed on amion for assistance.  07/27/2019, 9:48 PM

## 2019-07-28 ENCOUNTER — Observation Stay (HOSPITAL_COMMUNITY): Payer: Managed Care, Other (non HMO)

## 2019-07-28 ENCOUNTER — Observation Stay (HOSPITAL_BASED_OUTPATIENT_CLINIC_OR_DEPARTMENT_OTHER): Payer: Managed Care, Other (non HMO)

## 2019-07-28 DIAGNOSIS — R079 Chest pain, unspecified: Secondary | ICD-10-CM

## 2019-07-28 DIAGNOSIS — E876 Hypokalemia: Secondary | ICD-10-CM

## 2019-07-28 LAB — BASIC METABOLIC PANEL
Anion gap: 11 (ref 5–15)
BUN: 10 mg/dL (ref 6–20)
CO2: 26 mmol/L (ref 22–32)
Calcium: 9 mg/dL (ref 8.9–10.3)
Chloride: 104 mmol/L (ref 98–111)
Creatinine, Ser: 0.66 mg/dL (ref 0.44–1.00)
GFR calc Af Amer: >60 mL/min (ref >60)
GFR calc non Af Amer: >60 mL/min (ref >60)
Glucose, Bld: 96 mg/dL (ref 70–99)
Potassium: 3.3 mmol/L — ABNORMAL LOW (ref 3.5–5.1)
Sodium: 141 mmol/L (ref 135–145)

## 2019-07-28 LAB — ECHOCARDIOGRAM COMPLETE
Height: 68 in
Weight: 3312 oz

## 2019-07-28 MED ORDER — ASPIRIN EC 81 MG PO TBEC
81.0000 mg | DELAYED_RELEASE_TABLET | Freq: Every day | ORAL | 0 refills | Status: AC
Start: 2019-07-28 — End: 2019-08-27

## 2019-07-28 MED ORDER — METOPROLOL TARTRATE 50 MG PO TABS
50.0000 mg | ORAL_TABLET | ORAL | Status: AC
  Administered 2019-07-28: 50 mg via ORAL
  Filled 2019-07-28: qty 1

## 2019-07-28 MED ORDER — ATORVASTATIN CALCIUM 10 MG PO TABS: 20.0000 mg | ORAL_TABLET | Freq: Every day | ORAL | Status: DC

## 2019-07-28 MED ORDER — ATORVASTATIN CALCIUM 20 MG PO TABS: 20.0000 mg | ORAL_TABLET | Freq: Every day | ORAL | 0 refills | Status: DC

## 2019-07-28 MED ORDER — IOHEXOL 350 MG/ML SOLN
80.0000 mL | Freq: Once | INTRAVENOUS | Status: AC | PRN
  Administered 2019-07-28: 80 mL via INTRAVENOUS

## 2019-07-28 MED ORDER — NITROGLYCERIN 0.4 MG SL SUBL
0.4000 mg | SUBLINGUAL_TABLET | SUBLINGUAL | Status: DC | PRN
  Administered 2019-07-28: 0.4 mg via SUBLINGUAL
  Filled 2019-07-28: qty 1

## 2019-07-28 NOTE — Progress Notes (Signed)
Patient's IVs removed by NT and telemetry, CCMD aware  Discharge education provided at bedside  Patient has all belongings  Patient denies wheelchair, ambulated off unit with RN

## 2019-07-28 NOTE — Progress Notes (Signed)
Reviewed CT with patient  Non obstructive tightest lesion 25-49% in ostial D2 High calcium score for age 59 -> 82 th percentile Previously on statin but stopped when she lost weight Start lipitor 20 mg daily F/U labs with primary Dr Etter Sjogren she has appointment latter this month already Would check lipid and liver in 3 months with target LDL < 70 most recent was [place] for d/c  Jenkins Rouge MD [facility]

## 2019-07-28 NOTE — Discharge Instructions (Signed)
Angina  Angina is very bad discomfort or pain in the chest, neck, arm, jaw, or back. The discomfort is caused by a lack of blood in the middle layer of the heart wall (myocardium). What are the causes? This condition is caused by a buildup of fat and cholesterol (plaque) in your arteries (atherosclerosis). This buildup narrows the arteries and makes it hard for blood to flow. What increases the risk? You are more likely to develop this condition if:  You have high levels of cholesterol in your blood.  You have high blood pressure (hypertension).  You have diabetes.  You have a family history of heart disease.  You are not active, or you do not exercise enough.  You feel sad (depressed).  You have been treated with high energy rays (radiation) on the left side of your chest. Other risk factors are:  Using tobacco.  Being very overweight (obese).  Eating a diet high in unhealthy fats (saturated fats).  Having stress, or being exposed to things that cause stress.  Using drugs, such as cocaine. Women have a greater risk for angina if:  They are older than 55.  They have stopped having their period (are in postmenopause). What are the signs or symptoms? Common symptoms of this condition in both men and women may include:  Chest pain, which may: ? Feel like a crushing or squeezing in the chest. ? Feel like a tightness, pressure, fullness, or heaviness in the chest. ? Last for more than a few minutes at a time. ? Stop and come back (recur) after a few minutes.  Pain in the neck, arm, jaw, or back.  Heartburn or upset stomach (indigestion) for no reason.  Being short of breath.  Feeling sick to your stomach (nauseous).  Sudden cold sweats. Women and people with diabetes may have other symptoms that are not usual, such as feeling:  Tired (fatigue).  Worried or nervous (anxious) for no reason.  Weak for no reason.  Dizzy or passing out (fainting). How is this  treated? This condition may be treated with:  Medicines. These are given to: ? Prevent blood clots. ? Prevent heart attack. ? Relax blood vessels and improve blood flow to the heart (nitrates). ? Reduce blood pressure. ? Improve the pumping action of the heart. ? Reduce fat and cholesterol in the blood.  A procedure to widen a narrowed or blocked artery in the heart (angioplasty).  Surgery to allow blood to go around a blocked artery (coronary artery bypass surgery). Follow these instructions at home: Medicines  Take over-the-counter and prescription medicines only as told by your doctor.  Do not take these medicines unless your doctor says that you can: ? NSAIDs. These include:  Ibuprofen.  Naproxen. ? Vitamin supplements that have vitamin A, vitamin E, or both. ? Hormone therapy that contains estrogen with or without progestin. Eating and drinking   Eat a heart-healthy diet that includes: ? Lots of fresh fruits and vegetables. ? Whole grains. ? Low-fat (lean) protein. ? Low-fat dairy products.  Follow instructions from your doctor about what you cannot eat or drink. Activity  Follow an exercise program that your doctor tells you.  Talk with your doctor about joining a program to help improve the health of your heart (cardiac rehab).  When you feel tired, take a break. Plan breaks if you know you are going to feel tired. Lifestyle   Do not use any products that contain nicotine or tobacco. This includes cigarettes, e-cigarettes, and   chewing tobacco. If you need help quitting, ask your doctor.  If your doctor says you can drink alcohol: ? Limit how much you use to:  0-1 drink a day for women who are not pregnant.  0-2 drinks a day for men. ? Be aware of how much alcohol is in your drink. In the [place] one drink equals:  One 12 oz bottle of beer (355 mL).  One 5 oz glass of wine (148 mL).  One 1 oz glass of hard liquor (44 mL). General instructions  Stay  at a healthy weight. If your doctor tells you to do so, work with him or her to lose weight.  Learn to deal with stress. If you need help, ask your doctor.  Keep your vaccines up to date. Get a flu shot every year.  Talk with your doctor if you feel sad. Take a screening test to see if you are at risk for depression.  Work with your doctor to manage any other health problems that you have. These may include diabetes or high blood pressure.  Keep all follow-up visits as told by your doctor. This is important. Get help right away if:  You have pain in your chest, neck, arm, jaw, or back, and the pain: ? Lasts more than a few minutes. ? Comes back. ? Does not get better after you take medicine under your tongue (sublingual nitroglycerin). ? Keeps getting worse. ? Comes more often.  You have any of these problems for no reason: ? Sweating a lot. ? Heartburn or upset stomach. ? Shortness of breath. ? Trouble breathing. ? Feeling sick to your stomach. ? Throwing up (vomiting). ? Feeling more tired than normal. ? Feeling nervous or worrying more than normal. ? Weakness.  You are suddenly dizzy or light-headed.  You pass out. These symptoms may be an emergency. Do not wait to see if the symptoms will go away. Get medical help right away. Call your local emergency services (911 in the [place] Do not drive yourself to the hospital. Summary  Angina is very bad discomfort or pain in the chest, neck, arm, neck, or back.  Symptoms include chest pain, heartburn or upset stomach for no reason, and shortness of breath.  Women or people with diabetes may have symptoms that are not usual, such as feeling nervous or worried for no reason, weak for no reason, or tired.  Take all medicines only as told by your doctor.  You should eat a heart-healthy diet and follow an exercise program. This information is not intended to replace advice given to you by your health care provider. Make sure you  discuss any questions you have with your health care provider. Document Revised: 09/19/2017 Document Reviewed: 09/19/2017 Elsevier Patient Education  2020 Elsevier Inc.  

## 2019-07-28 NOTE — Discharge Summary (Signed)
Physician Discharge Summary  Anna Jackson ZOX:[id number] DOB: Jan 06, 1961 DOA: 07/27/2019  PCP: Ann Held, DO  Admit date: 07/27/2019 Discharge date: 07/28/2019  Admitted From: Home Disposition: Home  Recommendations for Outpatient Follow-up:  1. Follow up with PCP in 1-2 weeks 2. Follow with primary cardiologist in 1 to 2 weeks 3. Please obtain BMP/CBC in one week 4. Please follow up with your PCP on the following pending results: Unresulted Labs (From admission, onward) Comment         None       Home Health: None Equipment/Devices: None  Discharge Condition: Stable CODE STATUS: Full code Diet recommendation: Cardiac  Subjective: Seen and examined. Patient feels much better. No more return of the chest pain or either complaint.  Brief/Interim Summary: Anna Jackson is a 59 y.o. female with medical history significant of NHL in remission, barrett esophagus, anxiety presented to the ED with c/o intermittent CP for last 3 days. EKG unremarkable. She was admitted under hospital service for rule out MI. Cardiology was consulted. She was ruled out of MI. Transthoracic echo did not show any wall motion abnormality and had normal ejection fraction. Cardiology did CT coronary on her which showed high calcium score for her age with CAD which was nonobstructive. Cardiology cleared patient for discharge and she is asymptomatic so she is being discharged in stable condition. Prior to coming, she was not in any statin. She is being discharged on atorvastatin 20 mg p.o. daily per cardiology recommendation. She was taking 625 mg of aspirin as needed prior to that. She is being discharged on 81 mg p.o. daily. Resume rest of the medications. Also had hypokalemia which was replaced.   Discharge Diagnoses:  Principal Problem:   Chest pain, rule out acute myocardial infarction Active Problems:   Essential hypertension   History of lymphoma   Hypokalemia    Discharge  Instructions   Allergies as of 07/28/2019      Reactions   Betadine [povidone Iodine] Hives, Itching   Codeine Nausea And Vomiting      Medication List    TAKE these medications   ALPRAZolam 0.25 MG tablet Commonly known as: XANAX Take 1 tablet (0.25 mg total) by mouth 2 (two) times daily as needed for anxiety.   aspirin EC 81 MG tablet Take 1 tablet (81 mg total) by mouth daily. Swallow whole. What changed:   medication strength  how much to take  when to take this  reasons to take this  additional instructions   atorvastatin 20 MG tablet Commonly known as: LIPITOR Take 1 tablet (20 mg total) by mouth daily.   docusate sodium 100 MG capsule Commonly known as: COLACE Take 200 mg by mouth in the morning.   FLUoxetine 40 MG capsule Commonly known as: PROZAC Take 1 capsule (40 mg total) by mouth daily.   hydrochlorothiazide 25 MG tablet Commonly known as: HYDRODIURIL Take 1 tablet (25 mg total) by mouth daily.   NON FORMULARY Take 1 tablet by mouth See admin instructions. Nature Made Super C Immune Complex tablets- Take 1 tablet by mouth once a day   valACYclovir 1000 MG tablet Commonly known as: Valtrex Take 1 tablet (1,000 mg total) by mouth 3 (three) times daily.   vitamin B-12 1000 MCG tablet Commonly known as: CYANOCOBALAMIN Take 1,000 mcg by mouth daily.   Vitamin D3 Super Strength 50 MCG (2000 UT) Caps Generic drug: Cholecalciferol Take 2,000 Units by mouth daily.  Follow-up Information    Roma Schanz R, DO Follow up in 1 week(s).   Specialty: Family Medicine Contact information: [address] [place] [address] [phone]              Allergies  Allergen Reactions  . Betadine [Povidone Iodine] Hives and Itching  . Codeine Nausea And Vomiting    Consultations: Cardiology   Procedures/Studies: DG Chest 2 View  Result Date: 07/27/2019 CLINICAL DATA:  Chest pain for 2 days at, history lymphoma,  asthma, hypertension EXAM: CHEST - 2 VIEW COMPARISON:  06/02/2017 FINDINGS: Normal heart size, mediastinal contours, and pulmonary vascularity. Atherosclerotic calcification aorta. Lungs clear. No infiltrate, pleural effusion, or pneumothorax. Bones demineralized with scattered endplate spur formation thoracic spine. Surgical clips LEFT axilla, perigastric, and RIGHT upper quadrant. IMPRESSION: No acute abnormalities. Aortic Atherosclerosis (ICD10-I70.0). Electronically Signed   By: Lavonia Dana M.D.   On: 07/27/2019 12:46   CT CORONARY MORPH W/CTA COR W/SCORE W/CA W/CM &/OR WO/CM  Result Date: 07/28/2019 CLINICAL DATA:  Chest pain EXAM: Cardiac CTA MEDICATIONS: Sub lingual nitro. 4mg  and lopressor 50mg  orally TECHNIQUE: The patient was scanned on a Siemens Force 749 slice scanner. Gantry rotation speed was 250 msecs. Collimation was .6 mm. A 120 kV prospective scan was triggered in the ascending thoracic aorta at 140 HU's Full mA was used between 35% and 75% of the R-R interval. Average HR during the scan was 68 bpm. The 3D data set was interpreted on a dedicated work station using MPR, MIP and VRT modes. A total of 80 cc of contrast was used. FINDINGS: Non-cardiac: See separate report from [facility]. No significant findings on limited lung and soft tissue windows. Calcium Score: Calcium isolated to LAD/Diagonal system Coronary Arteries: Right dominant with no anomalies LM: Normal LAD: 1-24% calcified plaque in the mid and distal vessel D1: Normal D2: 25-49% calcified ostial stenosis Circumflex: Normal OM1: Normal AV Groove : Normal RCA: Large dominant vessel normal PDA: Normal PLA: Normal IMPRESSION: 1. Coronary calcium score 41 which is 48 th percentile for age and sex 2.  Normal aortic root 3.0 cm 3.  CAD RADS 2 non obstructive CAD see description above Jenkins Rouge Electronically Signed   By: Jenkins Rouge M.D.   On: 07/28/2019 15:18   ECHOCARDIOGRAM COMPLETE  Result Date: 07/28/2019     ECHOCARDIOGRAM REPORT   Patient Name:   Anna Jackson Date of Exam: 07/28/2019 Medical Rec #:  [id number]           Height:       68.0 in Accession #:    [id number]          Weight:       207.0 lb Date of Birth:  1960/07/01           BSA:          2.074 m Patient Age:    59 years            BP:           111/67 mmHg Patient Gender: F                   HR:           62 bpm. Exam Location:  Inpatient Procedure: 2D Echo, Cardiac Doppler and Color Doppler Indications:    Chest pain  History:        Patient has prior history of Echocardiogram examinations, most  recent 03/26/2013. Signs/Symptoms:Chest Pain; Risk                 Factors:Hypertension and Dyslipidemia.  Sonographer:    Dustin Flock Referring Phys: [place]  1. Abnormal septal motion . Left ventricular ejection fraction, by estimation, is 55%. The left ventricle has normal function. The left ventricle has no regional wall motion abnormalities. There is mild left ventricular hypertrophy. Left ventricular diastolic parameters were normal.  2. Right ventricular systolic function is normal. The right ventricular size is normal. There is normal pulmonary artery systolic pressure.  3. Left atrial size was moderately dilated.  4. The mitral valve is normal in structure. Trivial mitral valve regurgitation. No evidence of mitral stenosis.  5. The aortic valve is normal in structure. Aortic valve regurgitation is trivial. No aortic stenosis is present.  6. The inferior vena cava is normal in size with greater than 50% respiratory variability, suggesting right atrial pressure of 3 mmHg. FINDINGS  Left Ventricle: Abnormal septal motion. Left ventricular ejection fraction, by estimation, is 55%. The left ventricle has normal function. The left ventricle has no regional wall motion abnormalities. The left ventricular internal cavity size was normal  in size. There is mild left ventricular hypertrophy. Left ventricular diastolic  parameters were normal. Right Ventricle: The right ventricular size is normal. No increase in right ventricular wall thickness. Right ventricular systolic function is normal. There is normal pulmonary artery systolic pressure. The tricuspid regurgitant velocity is 2.58 m/s, and  with an assumed right atrial pressure of 3 mmHg, the estimated right ventricular systolic pressure is 64.4 mmHg. Left Atrium: Left atrial size was moderately dilated. Right Atrium: Right atrial size was normal in size. Pericardium: There is no evidence of pericardial effusion. Mitral Valve: The mitral valve is normal in structure. There is mild thickening of the mitral valve leaflet(s). Normal mobility of the mitral valve leaflets. Trivial mitral valve regurgitation. No evidence of mitral valve stenosis. Tricuspid Valve: The tricuspid valve is normal in structure. Tricuspid valve regurgitation is trivial. No evidence of tricuspid stenosis. Aortic Valve: The aortic valve is normal in structure. Aortic valve regurgitation is trivial. No aortic stenosis is present. Pulmonic Valve: The pulmonic valve was normal in structure. Pulmonic valve regurgitation is not visualized. No evidence of pulmonic stenosis. Aorta: The aortic root is normal in size and structure. Venous: The inferior vena cava is normal in size with greater than 50% respiratory variability, suggesting right atrial pressure of 3 mmHg. IAS/Shunts: No atrial level shunt detected by color flow Doppler.  LEFT VENTRICLE PLAX 2D LVIDd:         4.60 cm  Diastology LVIDs:         2.90 cm  LV e' lateral:   11.20 cm/s LV PW:         1.30 cm  LV E/e' lateral: 5.2 LV IVS:        1.40 cm  LV e' medial:    7.83 cm/s LVOT diam:     2.30 cm  LV E/e' medial:  7.4 LV SV:         79 LV SV Index:   38 LVOT Area:     4.15 cm  RIGHT VENTRICLE RV Basal diam:  2.40 cm RV S prime:     9.68 cm/s TAPSE (M-mode): 3.5 cm LEFT ATRIUM             Index       RIGHT ATRIUM  Index LA diam:        4.70 cm  2.27 cm/m  RA Area:     12.60 cm LA Vol (A2C):   45.6 ml 21.99 ml/m RA Volume:   28.40 ml  13.69 ml/m LA Vol (A4C):   44.3 ml 21.36 ml/m LA Biplane Vol: 45.2 ml 21.80 ml/m  AORTIC VALVE LVOT Vmax:   78.30 cm/s LVOT Vmean:  52.000 cm/s LVOT VTI:    0.191 m  AORTA Ao Root diam: 2.90 cm MITRAL VALVE               TRICUSPID VALVE MV Area (PHT): 3.36 cm    TR Peak grad:   26.6 mmHg MV Decel Time: 226 msec    TR Vmax:        258.00 cm/s MV E velocity: 57.80 cm/s MV A velocity: 49.70 cm/s  SHUNTS MV E/A ratio:  1.16        Systemic VTI:  0.19 m                            Systemic Diam: 2.30 cm Jenkins Rouge MD Electronically signed by Jenkins Rouge MD Signature Date/Time: 07/28/2019/12:20:37 PM    Final       Discharge Exam: Vitals:   07/28/19 1437 07/28/19 1511  BP: 129/77 116/71  Pulse: 61 (!) 52  Resp:  17  Temp:    SpO2:  99%   Vitals:   07/28/19 0827 07/28/19 1226 07/28/19 1437 07/28/19 1511  BP: 111/67 130/69 129/77 116/71  Pulse: 62 (!) 59 61 (!) 52  Resp: 17 16  17   Temp:  97.8 F (36.6 C)    TempSrc:  Oral    SpO2: 98% 100%  99%  Weight:      Height:        General: Pt is alert, awake, not in acute distress Cardiovascular: RRR, S1/S2 +, no rubs, no gallops Respiratory: CTA bilaterally, no wheezing, no rhonchi Abdominal: Soft, NT, ND, bowel sounds + Extremities: no edema, no cyanosis    The results of significant diagnostics from this hospitalization (including imaging, microbiology, ancillary and laboratory) are listed below for reference.     Microbiology: Recent Results (from the past 240 hour(s))  SARS Coronavirus 2 by RT PCR (hospital order, performed in [facility] hospital lab) Nasopharyngeal Nasopharyngeal Swab     Status: None   Collection Time: 07/27/19  9:04 PM   Specimen: Nasopharyngeal Swab  Result Value Ref Range Status   SARS Coronavirus 2 NEGATIVE NEGATIVE Final    Comment: (NOTE) SARS-CoV-2 target nucleic acids are NOT DETECTED.  The SARS-CoV-2  RNA is generally detectable in upper and lower respiratory specimens during the acute phase of infection. The lowest concentration of SARS-CoV-2 viral copies this assay can detect is 250 copies / mL. A negative result does not preclude SARS-CoV-2 infection and should not be used as the sole basis for treatment or other patient management decisions.  A negative result may occur with improper specimen collection / handling, submission of specimen other than nasopharyngeal swab, presence of viral mutation(s) within the areas targeted by this assay, and inadequate number of viral copies (<250 copies / mL). A negative result must be combined with clinical observations, patient history, and epidemiological information.  Fact Sheet for Patients:   StrictlyIdeas.no  Fact Sheet for Healthcare Providers: BankingDealers.co.za  This test is not yet approved or  cleared by the [place] FDA and has been  authorized for detection and/or diagnosis of SARS-CoV-2 by FDA under an Emergency Use Authorization (EUA).  This EUA will remain in effect (meaning this test can be used) for the duration of the COVID-19 declaration under Section 564(b)(1) of the Act, 21 U.S.C. section 360bbb-3(b)(1), unless the authorization is terminated or revoked sooner.  Performed at [facility], [place] [address] [place], [place] [address]      Labs: BNP (last 3 results) No results for input(s): BNP in the last 8760 hours. Basic Metabolic Panel: Recent Labs  Lab 07/27/19 1221 07/28/19 1102  NA 139 141  K 3.0* 3.3*  CL 100 104  CO2 28 26  GLUCOSE 89 96  BUN 11 10  CREATININE 0.74 0.66  CALCIUM 9.1 9.0   Liver Function Tests: No results for input(s): AST, ALT, ALKPHOS, BILITOT, PROT, ALBUMIN in the last 168 hours. No results for input(s): LIPASE, AMYLASE in the last 168 hours. No results for input(s): AMMONIA in the last 168 hours. CBC: Recent Labs   Lab 07/27/19 1221  WBC 6.8  HGB 13.1  HCT 40.5  MCV 89.6  PLT 278   Cardiac Enzymes: No results for input(s): CKTOTAL, CKMB, CKMBINDEX, TROPONINI in the last 168 hours. BNP: Invalid input(s): POCBNP CBG: No results for input(s): GLUCAP in the last 168 hours. D-Dimer No results for input(s): DDIMER in the last 72 hours. Hgb A1c No results for input(s): HGBA1C in the last 72 hours. Lipid Profile No results for input(s): CHOL, HDL, LDLCALC, TRIG, CHOLHDL, LDLDIRECT in the last 72 hours. Thyroid function studies No results for input(s): TSH, T4TOTAL, T3FREE, THYROIDAB in the last 72 hours.  Invalid input(s): FREET3 Anemia work up No results for input(s): VITAMINB12, FOLATE, FERRITIN, TIBC, IRON, RETICCTPCT in the last 72 hours. Urinalysis    Component Value Date/Time   COLORURINE lt. yellow 12/05/2007 1251   APPEARANCEUR Clear 12/05/2007 1251   LABSPEC 1.030 06/11/2013 1559   PHURINE 6.0 06/11/2013 1559   PHURINE 5.5 12/05/2007 1251   GLUCOSEU Negative 06/11/2013 1559   HGBUR Moderate 06/11/2013 1559   HGBUR large 12/05/2007 1251   BILIRUBINUR negative 08/20/2015 0949   BILIRUBINUR Negative 06/11/2013 1559   KETONESUR Negative 06/11/2013 1559   PROTEINUR 1+ 08/20/2015 0949   PROTEINUR Negative 06/11/2013 1559   UROBILINOGEN 0.2 08/20/2015 0949   UROBILINOGEN 0.2 06/11/2013 1559   NITRITE positive 08/20/2015 0949   NITRITE Negative 06/11/2013 1559   NITRITE negative 12/05/2007 1251   LEUKOCYTESUR large (3+) (A) 08/20/2015 0949   LEUKOCYTESUR Negative 06/11/2013 1559   Sepsis Labs Invalid input(s): PROCALCITONIN,  WBC,  LACTICIDVEN Microbiology Recent Results (from the past 240 hour(s))  SARS Coronavirus 2 by RT PCR (hospital order, performed in [place] hospital lab) Nasopharyngeal Nasopharyngeal Swab     Status: None   Collection Time: 07/27/19  9:04 PM   Specimen: Nasopharyngeal Swab  Result Value Ref Range Status   SARS Coronavirus 2 NEGATIVE NEGATIVE  Final    Comment: (NOTE) SARS-CoV-2 target nucleic acids are NOT DETECTED.  The SARS-CoV-2 RNA is generally detectable in upper and lower respiratory specimens during the acute phase of infection. The lowest concentration of SARS-CoV-2 viral copies this assay can detect is 250 copies / mL. A negative result does not preclude SARS-CoV-2 infection and should not be used as the sole basis for treatment or other patient management decisions.  A negative result may occur with improper specimen collection / handling, submission of specimen other than nasopharyngeal swab, presence of viral mutation(s) within  the areas targeted by this assay, and inadequate number of viral copies (<250 copies / mL). A negative result must be combined with clinical observations, patient history, and epidemiological information.  Fact Sheet for Patients:   StrictlyIdeas.no  Fact Sheet for Healthcare Providers: BankingDealers.co.za  This test is not yet approved or  cleared by the [place] FDA and has been authorized for detection and/or diagnosis of SARS-CoV-2 by FDA under an Emergency Use Authorization (EUA).  This EUA will remain in effect (meaning this test can be used) for the duration of the COVID-19 declaration under Section 564(b)(1) of the Act, 21 U.S.C. section 360bbb-3(b)(1), unless the authorization is terminated or revoked sooner.  Performed at [facility], [place] [address], [place], [place] [address]      Time coordinating discharge: Over 30 minutes  SIGNED:   Darliss Cheney, MD  [facility] 07/28/2019, 4:04 PM  If 7PM-7AM, please contact night-coverage [email or web address]

## 2019-07-28 NOTE — Progress Notes (Signed)
  Echocardiogram 2D Echocardiogram has been performed.  Anna Jackson 07/28/2019, 11:47 AM

## 2019-07-28 NOTE — Progress Notes (Signed)
    Subjective:  Denies SSCP, palpitations or Dyspnea Unable to get 18 g iv on patient and only 2 nurses on floor Has not had beta blocker IV team called  Discussed cardiac CTA with patient and instructed nurse On timing of lopressor and having nitro with patient on transport Nurse indicated iv may not be in until 11:00  Objective:  Vitals:   07/27/19 1948 07/27/19 2311 07/28/19 0540 07/28/19 0827  BP:  (!) 130/94 116/73 111/67  Pulse: (!) 53 74 64 62  Resp: 18 16 17 17   Temp:  97.7 F (36.5 C) 97.8 F (36.6 C)   TempSrc:  Oral Oral   SpO2: 100% 100% 97% 98%  Weight:      Height:        Intake/Output from previous day: No intake or output data in the 24 hours ending 07/28/19 0849  Physical Exam: Affect appropriate Healthy:  appears stated age HEENT: normal Neck supple with no adenopathy JVP normal no bruits no thyromegaly Lungs clear with no wheezing and good diaphragmatic motion Heart:  S1/S2 no murmur, no rub, gallop or click PMI normal Abdomen: benighn, BS positve, no tenderness, no AAA no bruit.  No HSM or HJR Distal pulses intact with no bruits No edema Neuro non-focal Skin warm and dry No muscular weakness   Lab Results: Basic Metabolic Panel: Recent Labs    07/27/19 1221  NA 139  K 3.0*  CL 100  CO2 28  GLUCOSE 89  BUN 11  CREATININE 0.74  CALCIUM 9.1   Liver Function Tests: No results for input(s): AST, ALT, ALKPHOS, BILITOT, PROT, ALBUMIN in the last 72 hours. No results for input(s): LIPASE, AMYLASE in the last 72 hours. CBC: Recent Labs    07/27/19 1221  WBC 6.8  HGB 13.1  HCT 40.5  MCV 89.6  PLT 278    Imaging: DG Chest 2 View  Result Date: 07/27/2019 CLINICAL DATA:  Chest pain for 2 days at, history lymphoma, asthma, hypertension EXAM: CHEST - 2 VIEW COMPARISON:  06/02/2017 FINDINGS: Normal heart size, mediastinal contours, and pulmonary vascularity. Atherosclerotic calcification aorta. Lungs clear. No infiltrate, pleural  effusion, or pneumothorax. Bones demineralized with scattered endplate spur formation thoracic spine. Surgical clips LEFT axilla, perigastric, and RIGHT upper quadrant. IMPRESSION: No acute abnormalities. Aortic Atherosclerosis (ICD10-I70.0). Electronically Signed   By: Lavonia Dana M.D.   On: 07/27/2019 12:46    Cardiac Studies:  ECG: SR poor R wave progressin  Telemetry:  Echo: pending   Medications:   . cholecalciferol  2,000 Units Oral Daily  . docusate sodium  200 mg Oral q AM  . enoxaparin (LOVENOX) injection  40 mg Subcutaneous Q24H  . FLUoxetine  40 mg Oral Daily  . hydrochlorothiazide  25 mg Oral Daily  . metoprolol tartrate  50 mg Oral NOW  . vitamin B-12  1,000 mcg Oral Daily      Assessment/Plan:    1. Chest pain:  Atypical r/o no acute ECG changes Logistics of getting cardiac CT done on 5C seem substantial Working on getting 18 g iv in pateint but delayed. Nurse knows lopressor needs to be given 1 hour before going for CT. Nitro to go with patient from floor so radiology does not have to access pixis.  She has no contrast allergy/issues Nurse to call [phone] when IV is in  CT tech aware   Jenkins Rouge 07/28/2019, 8:49 AM

## 2019-07-30 ENCOUNTER — Telehealth: Payer: Self-pay | Admitting: *Deleted

## 2019-07-30 ENCOUNTER — Telehealth: Payer: Self-pay | Admitting: Internal Medicine

## 2019-07-30 ENCOUNTER — Encounter: Payer: Self-pay | Admitting: Family Medicine

## 2019-07-30 NOTE — Telephone Encounter (Signed)
1st attempt. Unable to reach patient. LVM for pt to call office to schedule hospital follow up appointment.   

## 2019-07-30 NOTE — Telephone Encounter (Signed)
I attempted to contact patient 07/30/19 to schedule follow up visit from staff message that was received. The patient didn't answer so I left message for patient to return call to get appointment scheduled.

## 2019-07-31 NOTE — Telephone Encounter (Signed)
I have made two attempts and have been unable to reach patient. Pt has hospital follow up scheduled w/ PCP 08/02/19.

## 2019-08-02 ENCOUNTER — Other Ambulatory Visit: Payer: Self-pay

## 2019-08-02 ENCOUNTER — Encounter: Payer: Self-pay | Admitting: Family Medicine

## 2019-08-02 ENCOUNTER — Ambulatory Visit (INDEPENDENT_AMBULATORY_CARE_PROVIDER_SITE_OTHER): Payer: Managed Care, Other (non HMO) | Admitting: Family Medicine

## 2019-08-02 VITALS — BP 110/80 | HR 71 | Temp 97.9°F | Resp 18 | Ht 68.0 in | Wt 209.6 lb

## 2019-08-02 DIAGNOSIS — E876 Hypokalemia: Secondary | ICD-10-CM

## 2019-08-02 DIAGNOSIS — I208 Other forms of angina pectoris: Secondary | ICD-10-CM

## 2019-08-02 DIAGNOSIS — Z79899 Other long term (current) drug therapy: Secondary | ICD-10-CM | POA: Diagnosis not present

## 2019-08-02 DIAGNOSIS — R079 Chest pain, unspecified: Secondary | ICD-10-CM

## 2019-08-02 DIAGNOSIS — F410 Panic disorder [episodic paroxysmal anxiety] without agoraphobia: Secondary | ICD-10-CM

## 2019-08-02 DIAGNOSIS — F411 Generalized anxiety disorder: Secondary | ICD-10-CM

## 2019-08-02 MED ORDER — ALPRAZOLAM 0.25 MG PO TABS: 0.2500 mg | ORAL_TABLET | Freq: Two times a day (BID) | ORAL | 0 refills | Status: DC | PRN

## 2019-08-02 NOTE — Assessment & Plan Note (Signed)
No more cp since d/c

## 2019-08-02 NOTE — Assessment & Plan Note (Signed)
Stable con't prn ativan

## 2019-08-02 NOTE — Assessment & Plan Note (Signed)
Repeat today

## 2019-08-02 NOTE — Assessment & Plan Note (Signed)
Pt was started on lipitor due to ca score >89 % F/u 3 months repeat labs

## 2019-08-02 NOTE — Patient Instructions (Signed)

## 2019-08-02 NOTE — Progress Notes (Signed)
Patient ID: Anna Jackson, female    DOB: [birth year]  Age: 59 y.o. MRN: [id number]    Subjective:  Subjective  HPI Anna Jackson presents for f/u hosp.  She was admitted 6/11-6/12 for chest pain .    Cardiac ct done --- she had elevated calcium score for age >65 th %-- lipitor started in hosp.    Pt has had no more cp   Review of Systems  Constitutional: Negative for appetite change, diaphoresis, fatigue and unexpected weight change.  Eyes: Negative for pain, redness and visual disturbance.  Respiratory: Negative for cough, chest tightness, shortness of breath and wheezing.   Cardiovascular: Negative for chest pain, palpitations and leg swelling.  Endocrine: Negative for cold intolerance, heat intolerance, polydipsia, polyphagia and polyuria.  Genitourinary: Negative for difficulty urinating, dysuria and frequency.  Neurological: Negative for dizziness, light-headedness, numbness and headaches.    History Past Medical History:  Diagnosis Date  . Adenopathy    left axillary  . Anemia   . Anxiety   . Anxiety state, unspecified   . Asthma    no problem with asthma in several years  . Barrett esophagus   . Depression   . Esophageal reflux   . Headache 05/16/2013  . Herpes simplex without mention of complication   . Hyperlipidemia 03/12/2014  . Hypertension   . 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 Mental disorder   . Neuropathic pain 04/04/2014  . Non Hodgkin's lymphoma ([place]) 2015  . Skin infection 11/02/2013  . Unspecified asthma(493.90)   . Unspecified essential hypertension   . URI (upper respiratory infection) 01/15/2014    She has a past surgical history that includes Cholecystectomy (1995); Nissen fundoplication (0630); Cervical conization w/bx; Tubal ligation; Knee surgery (Right, 02/2010); Axillary lymph node dissection (Left, 03/09/2013); Portacath placement (N/A, 03/23/2013); Breast biopsy (Left, 02/13/2013); Breast excisional biopsy (Left, 03/09/2013); Esophageal  manometry (N/A, 01/12/2017); Incisional hernia repair (2004); and Vaginal hysterectomy (2002).   Her family history includes Alcoholism in her brother; Breast cancer (age of onset: 31) in her maternal grandmother; Cancer (age of onset: 34) in her maternal grandmother; Coronary artery disease in an other family member; Heart disease in her brother and mother; Hypertension in an other family member; Migraines in an other family member.She reports that she has never smoked. She has never used smokeless tobacco. She reports current alcohol use. She reports that she does not use drugs.  Current Outpatient Medications on File Prior to Visit  Medication Sig Dispense Refill  . aspirin EC 81 MG tablet Take 1 tablet (81 mg total) by mouth daily. Swallow whole. 30 tablet 0  . atorvastatin (LIPITOR) 20 MG tablet Take 1 tablet (20 mg total) by mouth daily. 30 tablet 0  . Cholecalciferol (VITAMIN D3 SUPER STRENGTH) 50 MCG (2000 UT) CAPS Take 2,000 Units by mouth daily.    Marland Kitchen docusate sodium (COLACE) 100 MG capsule Take 200 mg by mouth in the morning.    Marland Kitchen FLUoxetine (PROZAC) 40 MG capsule Take 1 capsule (40 mg total) by mouth daily. 90 capsule 3  . hydrochlorothiazide (HYDRODIURIL) 25 MG tablet Take 1 tablet (25 mg total) by mouth daily. 90 tablet 3  . NON FORMULARY Take 1 tablet by mouth See admin instructions. Nature Made Super C Immune Complex tablets- Take 1 tablet by mouth once a day    . valACYclovir (VALTREX) 1000 MG tablet Take 1 tablet (1,000 mg total) by mouth 3 (three) times daily. 90 tablet 1  . vitamin B-12 (CYANOCOBALAMIN)  1000 MCG tablet Take 1,000 mcg by mouth daily.     No current facility-administered medications on file prior to visit.     Objective:  Objective  Physical Exam Vitals and nursing note reviewed.  Constitutional:      Appearance: She is well-developed.  HENT:     Head: Normocephalic and atraumatic.  Eyes:     Conjunctiva/sclera: Conjunctivae normal.  Neck:     Thyroid:  No thyromegaly.     Vascular: No carotid bruit or JVD.  Cardiovascular:     Rate and Rhythm: Normal rate and regular rhythm.     Heart sounds: Normal heart sounds. No murmur heard.   Pulmonary:     Effort: Pulmonary effort is normal. No respiratory distress.     Breath sounds: Normal breath sounds. No wheezing or rales.  Chest:     Chest wall: No tenderness.  Musculoskeletal:     Cervical back: Normal range of motion and neck supple.  Neurological:     Mental Status: She is alert and oriented to person, place, and time.    BP 110/80 (BP Location: Left Arm, Patient Position: Sitting, Cuff Size: Normal)   Pulse 71   Temp 97.9 F (36.6 C) (Temporal)   Resp 18   Ht 5\' 8"  (1.727 m)   Wt 209 lb 9.6 oz (95.1 kg)   SpO2 99%   BMI 31.87 kg/m  Wt Readings from Last 3 Encounters:  08/02/19 209 lb 9.6 oz (95.1 kg)  07/27/19 207 lb (93.9 kg)  05/25/19 207 lb 6.4 oz (94.1 kg)     Lab Results  Component Value Date   WBC 6.8 07/27/2019   HGB 13.1 07/27/2019   HCT 40.5 07/27/2019   PLT 278 07/27/2019   GLUCOSE 96 07/28/2019   CHOL 181 04/27/2019   TRIG 106 04/27/2019   HDL 71 04/27/2019   LDLDIRECT 105.0 11/23/2010   LDLCALC 90 04/27/2019   ALT 13 04/27/2019   AST 12 04/27/2019   NA 141 07/28/2019   K 3.3 (L) 07/28/2019   CL 104 07/28/2019   CREATININE 0.66 07/28/2019   BUN 10 07/28/2019   CO2 26 07/28/2019   TSH 1.60 04/27/2019   INR 0.99 03/29/2013    DG Chest 2 View  Result Date: 07/27/2019 CLINICAL DATA:  Chest pain for 2 days at, history lymphoma, asthma, hypertension EXAM: CHEST - 2 VIEW COMPARISON:  06/02/2017 FINDINGS: Normal heart size, mediastinal contours, and pulmonary vascularity. Atherosclerotic calcification aorta. Lungs clear. No infiltrate, pleural effusion, or pneumothorax. Bones demineralized with scattered endplate spur formation thoracic spine. Surgical clips LEFT axilla, perigastric, and RIGHT upper quadrant. IMPRESSION: No acute abnormalities. Aortic  Atherosclerosis (ICD10-I70.0). Electronically Signed   By: Lavonia Dana M.D.   On: 07/27/2019 12:46   CT CORONARY MORPH W/CTA COR W/SCORE W/CA W/CM &/OR WO/CM  Addendum Date: 07/30/2019   ADDENDUM REPORT: 07/30/2019 10:56 EXAM: OVER-READ INTERPRETATION  CT CHEST The following report is an over-read performed by radiologist Dr. Samara Snide [facility] on 07/30/2019. This over-read does not include interpretation of cardiac or coronary anatomy or pathology. The coronary CTA interpretation by the cardiologist is attached. COMPARISON:  08/31/2013 PET-CT. FINDINGS: Cardiovascular: Normal heart size. No significant pericardial effusion/thickening. Great vessels are normal in course and caliber. No central pulmonary emboli. Mediastinum/Nodes: Small fluid level in the lower thoracic esophagus. Surgical clip adjacent to the esophagogastric junction. No pathologically enlarged mediastinal or hilar lymph nodes. Lungs/Pleura: No pneumothorax. No pleural effusion. No acute consolidative airspace disease, lung  masses or significant pulmonary nodules. Upper abdomen: No acute abnormality. Musculoskeletal: No aggressive appearing focal osseous lesions. Moderate thoracic spondylosis. IMPRESSION: 1. Small fluid level in the lower thoracic esophagus, suggesting esophageal dysmotility and/or gastroesophageal reflux. 2. Otherwise, no significant extracardiac findings. Electronically Signed   By: Ilona Sorrel M.D.   On: 07/30/2019 10:56   Result Date: 07/30/2019 CLINICAL DATA:  Chest pain EXAM: Cardiac CTA MEDICATIONS: Sub lingual nitro. 4mg  and lopressor 50mg  orally TECHNIQUE: The patient was scanned on a Siemens Force 161 slice scanner. Gantry rotation speed was 250 msecs. Collimation was .6 mm. A 120 kV prospective scan was triggered in the ascending thoracic aorta at 140 HU's Full mA was used between 35% and 75% of the R-R interval. Average HR during the scan was 68 bpm. The 3D data set was interpreted on a dedicated  work station using MPR, MIP and VRT modes. A total of 80 cc of contrast was used. FINDINGS: Non-cardiac: See separate report from [facility]. No significant findings on limited lung and soft tissue windows. Calcium Score: Calcium isolated to LAD/Diagonal system Coronary Arteries: Right dominant with no anomalies LM: Normal LAD: 1-24% calcified plaque in the mid and distal vessel D1: Normal D2: 25-49% calcified ostial stenosis Circumflex: Normal OM1: Normal AV Groove : Normal RCA: Large dominant vessel normal PDA: Normal PLA: Normal IMPRESSION: 1. Coronary calcium score 41 which is 59 th percentile for age and sex 2.  Normal aortic root 3.0 cm 3.  CAD RADS 2 non obstructive CAD see description above Jenkins Rouge Electronically Signed: By: Jenkins Rouge M.D. On: 07/28/2019 15:18   ECHOCARDIOGRAM COMPLETE  Result Date: 07/28/2019    ECHOCARDIOGRAM REPORT   Patient Name:   LAKAISHA DANISH Date of Exam: 07/28/2019 Medical Rec #:  [id number]           Height:       68.0 in Accession #:    [id number]          Weight:       207.0 lb Date of Birth:  Apr 26, 1960           BSA:          2.074 m Patient Age:    81 years            BP:           111/67 mmHg Patient Gender: F                   HR:           62 bpm. Exam Location:  Inpatient Procedure: 2D Echo, Cardiac Doppler and Color Doppler Indications:    Chest pain  History:        Patient has prior history of Echocardiogram examinations, most                 recent 03/26/2013. Signs/Symptoms:Chest Pain; Risk                 Factors:Hypertension and Dyslipidemia.  Sonographer:    Dustin Flock Referring Phys: [place]  1. Abnormal septal motion . Left ventricular ejection fraction, by estimation, is 55%. The left ventricle has normal function. The left ventricle has no regional wall motion abnormalities. There is mild left ventricular hypertrophy. Left ventricular diastolic parameters were normal.  2. Right ventricular systolic function  is normal. The right ventricular size is normal. There is normal pulmonary artery systolic pressure.  3. Left atrial size was moderately  dilated.  4. The mitral valve is normal in structure. Trivial mitral valve regurgitation. No evidence of mitral stenosis.  5. The aortic valve is normal in structure. Aortic valve regurgitation is trivial. No aortic stenosis is present.  6. The inferior vena cava is normal in size with greater than 50% respiratory variability, suggesting right atrial pressure of 3 mmHg. FINDINGS  Left Ventricle: Abnormal septal motion. Left ventricular ejection fraction, by estimation, is 55%. The left ventricle has normal function. The left ventricle has no regional wall motion abnormalities. The left ventricular internal cavity size was normal  in size. There is mild left ventricular hypertrophy. Left ventricular diastolic parameters were normal. Right Ventricle: The right ventricular size is normal. No increase in right ventricular wall thickness. Right ventricular systolic function is normal. There is normal pulmonary artery systolic pressure. The tricuspid regurgitant velocity is 2.58 m/s, and  with an assumed right atrial pressure of 3 mmHg, the estimated right ventricular systolic pressure is 18.2 mmHg. Left Atrium: Left atrial size was moderately dilated. Right Atrium: Right atrial size was normal in size. Pericardium: There is no evidence of pericardial effusion. Mitral Valve: The mitral valve is normal in structure. There is mild thickening of the mitral valve leaflet(s). Normal mobility of the mitral valve leaflets. Trivial mitral valve regurgitation. No evidence of mitral valve stenosis. Tricuspid Valve: The tricuspid valve is normal in structure. Tricuspid valve regurgitation is trivial. No evidence of tricuspid stenosis. Aortic Valve: The aortic valve is normal in structure. Aortic valve regurgitation is trivial. No aortic stenosis is present. Pulmonic Valve: The pulmonic valve was  normal in structure. Pulmonic valve regurgitation is not visualized. No evidence of pulmonic stenosis. Aorta: The aortic root is normal in size and structure. Venous: The inferior vena cava is normal in size with greater than 50% respiratory variability, suggesting right atrial pressure of 3 mmHg. IAS/Shunts: No atrial level shunt detected by color flow Doppler.  LEFT VENTRICLE PLAX 2D LVIDd:         4.60 cm  Diastology LVIDs:         2.90 cm  LV e' lateral:   11.20 cm/s LV PW:         1.30 cm  LV E/e' lateral: 5.2 LV IVS:        1.40 cm  LV e' medial:    7.83 cm/s LVOT diam:     2.30 cm  LV E/e' medial:  7.4 LV SV:         79 LV SV Index:   38 LVOT Area:     4.15 cm  RIGHT VENTRICLE RV Basal diam:  2.40 cm RV S prime:     9.68 cm/s TAPSE (M-mode): 3.5 cm LEFT ATRIUM             Index       RIGHT ATRIUM           Index LA diam:        4.70 cm 2.27 cm/m  RA Area:     12.60 cm LA Vol (A2C):   45.6 ml 21.99 ml/m RA Volume:   28.40 ml  13.69 ml/m LA Vol (A4C):   44.3 ml 21.36 ml/m LA Biplane Vol: 45.2 ml 21.80 ml/m  AORTIC VALVE LVOT Vmax:   78.30 cm/s LVOT Vmean:  52.000 cm/s LVOT VTI:    0.191 m  AORTA Ao Root diam: 2.90 cm MITRAL VALVE               TRICUSPID  VALVE MV Area (PHT): 3.36 cm    TR Peak grad:   26.6 mmHg MV Decel Time: 226 msec    TR Vmax:        258.00 cm/s MV E velocity: 57.80 cm/s MV A velocity: 49.70 cm/s  SHUNTS MV E/A ratio:  1.16        Systemic VTI:  0.19 m                            Systemic Diam: 2.30 cm Jenkins Rouge MD Electronically signed by Jenkins Rouge MD Signature Date/Time: 07/28/2019/12:20:37 PM    Final      Assessment & Plan:  Plan  I am having Anna Jackson maintain her vitamin B-12, valACYclovir, hydrochlorothiazide, FLUoxetine, Vitamin D3 Super Strength, NON FORMULARY, docusate sodium, atorvastatin, aspirin EC, and ALPRAZolam.  Meds ordered this encounter  Medications  . ALPRAZolam (XANAX) 0.25 MG tablet    Sig: Take 1 tablet (0.25 mg total) by mouth 2 (two)  times daily as needed for anxiety.    Dispense:  20 tablet    Refill:  0    Problem List Items Addressed This Visit      Unprioritized   Angina at rest [facility])    No more cp since d/c       Relevant Orders   Basic metabolic panel   CBC with Differential/Platelet   Anxiety state    Stable con't prn ativan      Relevant Medications   ALPRAZolam (XANAX) 0.25 MG tablet   Chest pain, rule out acute myocardial infarction    Pt was started on lipitor due to ca score >89 % F/u 3 months repeat labs       Hypokalemia    Repeat today      Relevant Orders   Basic metabolic panel    Other Visit Diagnoses    High risk medication use    -  Primary   Relevant Orders   DRUG MONITORING, PANEL 8 WITH CONFIRMATION, URINE   Severe anxiety with panic       Relevant Medications   ALPRAZolam (XANAX) 0.25 MG tablet      Follow-up: Return in about 3 months (around 11/02/2019), or if symptoms worsen or fail to improve, for annual exam, fasting--- reschedule cpe for 3 months .  Ann Held, DO

## 2019-08-03 LAB — BASIC METABOLIC PANEL
BUN: 14 mg/dL (ref 6–23)
CO2: 33 mEq/L — ABNORMAL HIGH (ref 19–32)
Calcium: 9.3 mg/dL (ref 8.4–10.5)
Chloride: 99 mEq/L (ref 96–112)
Creatinine, Ser: 0.7 mg/dL (ref 0.40–1.20)
GFR: 86.34 mL/min (ref >60.00)
Glucose, Bld: 91 mg/dL (ref 70–99)
Potassium: 3.2 mEq/L — ABNORMAL LOW (ref 3.5–5.1)
Sodium: 138 mEq/L (ref 135–145)

## 2019-08-03 LAB — DRUG MONITORING, PANEL 8 WITH CONFIRMATION, URINE
6 Acetylmorphine: NEGATIVE ng/mL (ref <10)
Alcohol Metabolites: NEGATIVE ng/mL
Amphetamines: NEGATIVE ng/mL (ref <500)
Benzodiazepines: NEGATIVE ng/mL (ref <100)
Buprenorphine, Urine: NEGATIVE ng/mL (ref <5)
Cocaine Metabolite: NEGATIVE ng/mL (ref <150)
Creatinine: 146.2 mg/dL
MDMA: NEGATIVE ng/mL (ref <500)
Marijuana Metabolite: NEGATIVE ng/mL (ref <20)
Opiates: NEGATIVE ng/mL (ref <100)
Oxidant: NEGATIVE ug/mL
Oxycodone: NEGATIVE ng/mL (ref <100)
pH: 6.2 (ref 4.5–9.0)

## 2019-08-03 LAB — CBC WITH DIFFERENTIAL/PLATELET
Basophils Absolute: 0.1 10*3/uL (ref 0.0–0.1)
Basophils Relative: 1.3 % (ref 0.0–3.0)
Eosinophils Absolute: 0.2 10*3/uL (ref 0.0–0.7)
Eosinophils Relative: 2.6 % (ref 0.0–5.0)
HCT: 38.4 % (ref 36.0–46.0)
Hemoglobin: 13 g/dL (ref 12.0–15.0)
Lymphocytes Relative: 32.9 % (ref 12.0–46.0)
Lymphs Abs: 2.1 10*3/uL (ref 0.7–4.0)
MCHC: 33.7 g/dL (ref 30.0–36.0)
MCV: 86.9 fl (ref 78.0–100.0)
Monocytes Absolute: 0.5 10*3/uL (ref 0.1–1.0)
Monocytes Relative: 8.1 % (ref 3.0–12.0)
Neutro Abs: 3.4 10*3/uL (ref 1.4–7.7)
Neutrophils Relative %: 55.1 % (ref 43.0–77.0)
Platelets: 264 10*3/uL (ref 150.0–400.0)
RBC: 4.42 Mil/uL (ref 3.87–5.11)
RDW: 13.8 % (ref 11.5–15.5)
WBC: 6.2 10*3/uL (ref 4.0–10.5)

## 2019-08-03 LAB — DM TEMPLATE

## 2019-08-08 ENCOUNTER — Other Ambulatory Visit: Payer: Self-pay

## 2019-08-08 MED ORDER — POTASSIUM CHLORIDE ER 10 MEQ PO TBCR: 10.0000 meq | EXTENDED_RELEASE_TABLET | Freq: Every day | ORAL | 2 refills | Status: DC

## 2019-08-10 ENCOUNTER — Encounter: Payer: Self-pay | Admitting: Family Medicine

## 2019-08-14 ENCOUNTER — Telehealth: Payer: Self-pay | Admitting: *Deleted

## 2019-08-14 DIAGNOSIS — E785 Hyperlipidemia, unspecified: Secondary | ICD-10-CM

## 2019-08-14 NOTE — Telephone Encounter (Signed)
LEFT MESSAGE FOR PATIENT TO CALL BACK TO SCHEDULE AN 3 MONTH APPT. IT WOULD BE IN SEPT 2021.  [facility] MAILED -DUE IN 3 MONTHS

## 2019-08-14 NOTE — Telephone Encounter (Signed)
-----   Message from Elouise Munroe, MD sent at 08/01/2019 11:13 PM EDT ----- Please set her up to see me at the time of follow up labs in 3 mo.  Thanks, GA ----- Message ----- From: Josue Hector, MD Sent: 07/28/2019   3:42 PM EDT To: Ann Held, DO, #  Needs f/u with Dr Margaretann Loveless in a year She has f/u this month with Dr Etter Sjogren Non obstructive CAD on CT  High calcium score for age  Started on lipitor 20 mg daily Should have f/u lipid and liver in 3 months  Echo was normal  D/c home today

## 2019-08-14 NOTE — Telephone Encounter (Signed)
PATIENT CALLED BACK - APPT GIVEN BY SCHEDULER

## 2019-09-04 ENCOUNTER — Other Ambulatory Visit: Payer: Self-pay | Admitting: Family Medicine

## 2019-09-04 ENCOUNTER — Encounter: Payer: Self-pay | Admitting: Family Medicine

## 2019-09-04 DIAGNOSIS — E1169 Type 2 diabetes mellitus with other specified complication: Secondary | ICD-10-CM

## 2019-09-04 DIAGNOSIS — E785 Hyperlipidemia, unspecified: Secondary | ICD-10-CM

## 2019-09-04 MED ORDER — ATORVASTATIN CALCIUM 20 MG PO TABS: 20.0000 mg | ORAL_TABLET | Freq: Every day | ORAL | 1 refills | Status: DC

## 2019-09-04 NOTE — Telephone Encounter (Signed)
Requested medication was last filled by Darliss Cheney, MD. Okay to send refill?

## 2019-09-04 NOTE — Telephone Encounter (Signed)
Sent in

## 2019-10-30 ENCOUNTER — Ambulatory Visit: Payer: Managed Care, Other (non HMO) | Admitting: Internal Medicine

## 2019-11-06 ENCOUNTER — Ambulatory Visit: Payer: Managed Care, Other (non HMO) | Admitting: Family Medicine

## 2019-11-06 DIAGNOSIS — Z0289 Encounter for other administrative examinations: Secondary | ICD-10-CM

## 2019-11-09 ENCOUNTER — Ambulatory Visit (INDEPENDENT_AMBULATORY_CARE_PROVIDER_SITE_OTHER): Payer: Managed Care, Other (non HMO) | Admitting: Family Medicine

## 2019-11-09 ENCOUNTER — Encounter: Payer: Self-pay | Admitting: Family Medicine

## 2019-11-09 ENCOUNTER — Other Ambulatory Visit: Payer: Self-pay

## 2019-11-09 VITALS — BP 132/79 | HR 73 | Temp 98.1°F | Resp 13 | Ht 68.0 in | Wt 212.2 lb

## 2019-11-09 DIAGNOSIS — H01001 Unspecified blepharitis right upper eyelid: Secondary | ICD-10-CM

## 2019-11-09 DIAGNOSIS — Z23 Encounter for immunization: Secondary | ICD-10-CM | POA: Diagnosis not present

## 2019-11-09 DIAGNOSIS — Z8579 Personal history of other malignant neoplasms of lymphoid, hematopoietic and related tissues: Secondary | ICD-10-CM | POA: Diagnosis not present

## 2019-11-09 DIAGNOSIS — I10 Essential (primary) hypertension: Secondary | ICD-10-CM

## 2019-11-09 DIAGNOSIS — E785 Hyperlipidemia, unspecified: Secondary | ICD-10-CM | POA: Diagnosis not present

## 2019-11-09 DIAGNOSIS — E538 Deficiency of other specified B group vitamins: Secondary | ICD-10-CM | POA: Diagnosis not present

## 2019-11-09 DIAGNOSIS — Z0000 Encounter for general adult medical examination without abnormal findings: Secondary | ICD-10-CM

## 2019-11-09 DIAGNOSIS — H01004 Unspecified blepharitis left upper eyelid: Secondary | ICD-10-CM

## 2019-11-09 MED ORDER — ERYTHROMYCIN 5 MG/GM OP OINT: 1.0000 "application " | TOPICAL_OINTMENT | Freq: Three times a day (TID) | OPHTHALMIC | 0 refills | Status: DC

## 2019-11-09 NOTE — Assessment & Plan Note (Signed)
Well controlled, no changes to meds. Encouraged heart healthy diet such as the DASH diet and exercise as tolerated.  con't hctz and potassium  Check labs

## 2019-11-09 NOTE — Progress Notes (Signed)
Subjective:     Anna Jackson is a 59 y.o. female and is here for a comprehensive physical exam. The patient reports no problems.  She had covid sept 1 and had the monoclonal antibody infusion  She has no  compliants except rash on her eyelids that is very itchy Pt needs labs and needs no refills at this time for bp or cholesterol  Social History   Socioeconomic History  . Marital status: Single    Spouse name: Not on file  . Number of children: 1  . Years of education: Not on file  . Highest education level: Not on file  Occupational History  . Occupation: RECEIVING Armed forces operational officer: [employer]  Tobacco Use  . Smoking status: Never Smoker  . Smokeless tobacco: Never Used  Vaping Use  . Vaping Use: Never used  Substance and Sexual Activity  . Alcohol use: Yes    Comment: occasional  . Drug use: No  . Sexual activity: Not Currently    Partners: Male  Other Topics Concern  . Not on file  Social History Narrative   Exercise-- walking   Social Determinants of Health   Financial Resource Strain:   . Difficulty of Paying Living Expenses: Not on file  Food Insecurity:   . Worried About Charity fundraiser in the Last Year: Not on file  . Ran Out of Food in the Last Year: Not on file  Transportation Needs:   . Lack of Transportation (Medical): Not on file  . Lack of Transportation (Non-Medical): Not on file  Physical Activity:   . Days of Exercise per Week: Not on file  . Minutes of Exercise per Session: Not on file  Stress:   . Feeling of Stress : Not on file  Social Connections:   . Frequency of Communication with Friends and Family: Not on file  . Frequency of Social Gatherings with Friends and Family: Not on file  . Attends Religious Services: Not on file  . Active Member of Clubs or Organizations: Not on file  . Attends Archivist Meetings: Not on file  . Marital Status: Not on file  Intimate Partner Violence:   . Fear of Current or Ex-Partner:  Not on file  . Emotionally Abused: Not on file  . Physically Abused: Not on file  . Sexually Abused: Not on file   Health Maintenance  Topic Date Due  . URINE MICROALBUMIN  Never done  . MAMMOGRAM  01/18/2017  . INFLUENZA VACCINE  09/16/2019  . Hepatitis C Screening  05/14/2023 (Originally [birth year])  . TETANUS/TDAP  11/22/2020  . COLONOSCOPY  11/12/2026  . COVID-19 Vaccine  Completed  . 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 She does not have any pertinent problems on file. She  has a past surgical history that includes Cholecystectomy (1995); Nissen fundoplication (3335); Cervical conization w/bx; Tubal ligation; Knee surgery (Right, 02/2010); Axillary lymph node dissection (Left, 03/09/2013); Portacath placement (N/A, 03/23/2013); Breast biopsy (Left, 02/13/2013); Breast excisional biopsy (Left, 03/09/2013); Esophageal manometry (N/A, 01/12/2017); Incisional hernia repair (  2004); and Vaginal hysterectomy (2002). Her family history includes Alcoholism in her brother; Breast cancer (age of onset: 13) in her maternal grandmother; Cancer (age of onset: 45) in her maternal grandmother; Coronary artery disease in an other family member; Heart disease in her brother and mother; Hypertension in an other family member; Migraines in an other family member. She  reports that she has never smoked. She has never  used smokeless tobacco. She reports current alcohol use. She reports that she does not use drugs. She has a current medication list which includes the following prescription(s): alprazolam, vitamin d3 super strength, docusate sodium, fluoxetine, hydrochlorothiazide, NON FORMULARY, potassium chloride, valacyclovir, vitamin b-12, atorvastatin, and erythromycin. Current Outpatient Medications on File Prior to Visit  Medication Sig Dispense Refill  . ALPRAZolam (XANAX) 0.25 MG tablet Take 1 tablet (0.25 mg total) by mouth 2 (two) times daily as needed for anxiety. 20 tablet 0  . Cholecalciferol (VITAMIN D3 SUPER STRENGTH) 50 MCG (2000 UT) CAPS Take 2,000 Units by mouth daily.    Marland Kitchen docusate sodium (COLACE) 100 MG capsule Take 200 mg by mouth in the morning.    Marland Kitchen FLUoxetine (PROZAC) 40 MG capsule Take 1 capsule (40 mg total) by mouth daily. 90 capsule 3  . hydrochlorothiazide (HYDRODIURIL) 25 MG tablet Take 1 tablet (25 mg total) by mouth daily. 90 tablet 3  . NON FORMULARY Take 1 tablet by mouth See admin instructions. Nature Made Super C Immune Complex tablets- Take 1 tablet by mouth once a day    . potassium chloride (KLOR-CON) 10 MEQ tablet Take 1 tablet (10 mEq total) by mouth daily. 30 tablet 2  . valACYclovir (VALTREX) 1000 MG tablet Take 1 tablet (1,000 mg total) by mouth 3 (three) times daily. 90 tablet 1  . vitamin B-12 (CYANOCOBALAMIN) 1000 MCG tablet Take 1,000 mcg by mouth daily.    Marland Kitchen atorvastatin (LIPITOR) 20 MG tablet Take 1 tablet (20 mg total) by mouth daily. 90 tablet 1   No current facility-administered medications on file prior to visit.   She is allergic to betadine [povidone iodine] and codeine..  Review of Systems Review of Systems  Constitutional: Negative for activity change, appetite change and fatigue.  HENT: Negative for hearing loss, congestion, tinnitus and ear discharge.  dentist q72m Eyes: Negative for visual disturbance (see optho q1y -- vision corrected to 20/20 with  glasses).  Respiratory: Negative for cough, chest tightness and shortness of breath.   Cardiovascular: Negative for chest pain, palpitations and leg swelling.  Gastrointestinal: Negative for abdominal pain, diarrhea, constipation and abdominal distention.  Genitourinary: Negative for urgency, frequency, decreased urine volume and difficulty urinating.  Musculoskeletal: Negative for back pain, arthralgias and gait problem.  Skin: Negative for color change, pallor and rash.  Neurological: Negative for dizziness, light-headedness, numbness and headaches.  Hematological: Negative for adenopathy. Does not bruise/bleed easily.  Psychiatric/Behavioral: Negative for suicidal ideas, confusion, sleep disturbance, self-injury, dysphoric mood, decreased concentration and agitation.       Objective:    BP 132/79 (BP Location: Right Arm, Patient Position: Sitting, Cuff Size: Normal)   Pulse 73   Temp 98.1 F (36.7 C) (Oral)   Resp 13   Ht 5\' 8"  (1.727 m)   Wt 212 lb 3.2 oz (96.3 kg)   SpO2 100%   BMI 32.26 kg/m  General appearance: alert, cooperative, appears stated age and no distress Head: Normocephalic, without obvious abnormality, atraumatic Eyes: perla, eomi,  Eyelids-- + red pap rash b/l  Ears: normal TM's and  external ear canals both ears Neck: no adenopathy, no carotid bruit, no JVD, supple, symmetrical, trachea midline and thyroid not enlarged, symmetric, no tenderness/mass/nodules Back: symmetric, no curvature. ROM normal. No CVA tenderness. Lungs: clear to auscultation bilaterally Breasts: deferred Heart: regular rate and rhythm, S1, S2 normal, no murmur, click, rub or gallop Abdomen: soft, non-tender; bowel sounds normal; no masses,  no organomegaly Pelvic: deferred and not indicated; status post hysterectomy, negative ROS Extremities: extremities normal, atraumatic, no cyanosis or edema Pulses: 2+ and symmetric Skin: Skin color, texture, turgor normal. No rashes or  lesions Lymph nodes: Cervical, supraclavicular, and axillary nodes normal. Neurologic: Alert and oriented X 3, normal strength and tone. Normal symmetric reflexes. Normal coordination and gait    Assessment:    Healthy female exam.      Plan:    ghm utd Check labs  See After Visit Summary for Counseling Recommendations    1. Need for influenza vaccination  - Flu Vaccine QUAD 36+ mos IM  2. History of lymphoma  - Ambulatory referral to Hematology / Oncology  3. Preventative health care See above  - TSH - Lipid panel - CBC with Differential/Platelet - Comprehensive metabolic panel  4. Essential hypertension Well controlled, no changes to meds. Encouraged heart healthy diet such as the DASH diet and exercise as tolerated.   - Microalbumin / creatinine urine ratio  5. B12 deficiency   - Vitamin B12  6. Hyperlipidemia, unspecified hyperlipidemia type Tolerating statin, encouraged heart healthy diet, avoid trans fats, minimize simple carbs and saturated fats. Increase exercise as tolerated  7. 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  Dispense: 3.5 g; Refill: 0

## 2019-11-09 NOTE — Assessment & Plan Note (Signed)
Tolerating statin, encouraged heart healthy diet, avoid trans fats, minimize simple carbs and saturated fats. Increase exercise as tolerated  Con't lipitor  

## 2019-11-09 NOTE — Assessment & Plan Note (Signed)
Pt moved to [place] before her 5 years check---- will get her back in with oncology  Pt with no complaints

## 2019-11-09 NOTE — Patient Instructions (Signed)
Preventive Care 41-57 Years Old, Female Preventive care refers to visits with your health care provider and lifestyle choices that can promote health and wellness. This includes:  A yearly physical exam. This may also be called an annual well check.  Regular dental visits and eye exams.  Immunizations.  Screening for certain conditions.  Healthy lifestyle choices, such as eating a healthy diet, getting regular exercise, not using drugs or products that contain nicotine and tobacco, and limiting alcohol use. What can I expect for my preventive care visit? Physical exam Your health care provider will check your:  Height and weight. This may be used to calculate body mass index (BMI), which tells if you are at a healthy weight.  Heart rate and blood pressure.  Skin for abnormal spots. Counseling Your health care provider may ask you questions about your:  Alcohol, tobacco, and drug use.  Emotional well-being.  Home and relationship well-being.  Sexual activity.  Eating habits.  Work and work Statistician.  Method of birth control.  Menstrual cycle.  Pregnancy history. What immunizations do I need?  Influenza (flu) vaccine  This is recommended every year. Tetanus, diphtheria, and pertussis (Tdap) vaccine  You may need a Td booster every 10 years. Varicella (chickenpox) vaccine  You may need this if you have not been vaccinated. Zoster (shingles) vaccine  You may need this after age 39. Measles, mumps, and rubella (MMR) vaccine  You may need at least one dose of MMR if you were born in 1957 or later. You may also need a second dose. Pneumococcal conjugate (PCV13) vaccine  You may need this if you have certain conditions and were not previously vaccinated. Pneumococcal polysaccharide (PPSV23) vaccine  You may need one or two doses if you smoke cigarettes or if you have certain conditions. Meningococcal conjugate (MenACWY) vaccine  You may need this if you  have certain conditions. Hepatitis A vaccine  You may need this if you have certain conditions or if you travel or work in places where you may be exposed to hepatitis A. Hepatitis B vaccine  You may need this if you have certain conditions or if you travel or work in places where you may be exposed to hepatitis B. Haemophilus influenzae type b (Hib) vaccine  You may need this if you have certain conditions. Human papillomavirus (HPV) vaccine  If recommended by your health care provider, you may need three doses over 6 months. You may receive vaccines as individual doses or as more than one vaccine together in one shot (combination vaccines). Talk with your health care provider about the risks and benefits of combination vaccines. What tests do I need? Blood tests  Lipid and cholesterol levels. These may be checked every 5 years, or more frequently if you are over 57 years old.  Hepatitis C test.  Hepatitis B test. Screening  Lung cancer screening. You may have this screening every year starting at age 57 if you have a 30-pack-year history of smoking and currently smoke or have quit within the past 15 years.  Colorectal cancer screening. All adults should have this screening starting at age 57 and continuing until age 50. Your health care provider may recommend screening at age 21 if you are at increased risk. You will have tests every 1-10 years, depending on your results and the type of screening test.  Diabetes screening. This is done by checking your blood sugar (glucose) after you have not eaten for a while (fasting). You may have this  done every 1-3 years.  Mammogram. This may be done every 1-2 years. Talk with your health care provider about when you should start having regular mammograms. This may depend on whether you have a family history of breast cancer.  BRCA-related cancer screening. This may be done if you have a family history of breast, ovarian, tubal, or peritoneal  cancers.  Pelvic exam and Pap test. This may be done every 3 years starting at age 57. Starting at age 57, this may be done every 5 years if you have a Pap test in combination with an HPV test. Other tests  Sexually transmitted disease (STD) testing.  Bone density scan. This is done to screen for osteoporosis. You may have this scan if you are at high risk for osteoporosis. Follow these instructions at home: Eating and drinking  Eat a diet that includes fresh fruits and vegetables, whole grains, lean protein, and low-fat dairy.  Take vitamin and mineral supplements as recommended by your health care provider.  Do not drink alcohol if: ? Your health care provider tells you not to drink. ? You are pregnant, may be pregnant, or are planning to become pregnant.  If you drink alcohol: ? Limit how much you have to 0-1 drink a day. ? Be aware of how much alcohol is in your drink. In the [place] one drink equals one 12 oz bottle of beer (355 mL), one 5 oz glass of wine (148 mL), or one 1 oz glass of hard liquor (44 mL). Lifestyle  Take daily care of your teeth and gums.  Stay active. Exercise for at least 30 minutes on 5 or more days each week.  Do not use any products that contain nicotine or tobacco, such as cigarettes, e-cigarettes, and chewing tobacco. If you need help quitting, ask your health care provider.  If you are sexually active, practice safe sex. Use a condom or other form of birth control (contraception) in order to prevent pregnancy and STIs (sexually transmitted infections).  If told by your health care provider, take low-dose aspirin daily starting at age 57. What's next?  Visit your health care provider once a year for a well check visit.  Ask your health care provider how often you should have your eyes and teeth checked.  Stay up to date on all vaccines. This information is not intended to replace advice given to you by your health care provider. Make sure you  discuss any questions you have with your health care provider. Document Revised: 10/13/2017 Document Reviewed: 10/13/2017 Elsevier Patient Education  2020 [employer].

## 2019-11-10 LAB — CBC WITH DIFFERENTIAL/PLATELET
Absolute Monocytes: 487 cells/uL (ref 200–950)
Basophils Absolute: 41 cells/uL (ref 0–200)
Basophils Relative: 0.7 %
Eosinophils Absolute: 122 cells/uL (ref 15–500)
Eosinophils Relative: 2.1 %
HCT: 40 % (ref 35.0–45.0)
Hemoglobin: 13.1 g/dL (ref 11.7–15.5)
Lymphs Abs: 1647 cells/uL (ref 850–3900)
MCH: 28.8 pg (ref 27.0–33.0)
MCHC: 32.8 g/dL (ref 32.0–36.0)
MCV: 87.9 fL (ref 80.0–100.0)
MPV: 10.4 fL (ref 7.5–12.5)
Monocytes Relative: 8.4 %
Neutro Abs: 3503 cells/uL (ref 1500–7800)
Neutrophils Relative %: 60.4 %
Platelets: 292 10*3/uL (ref 140–400)
RBC: 4.55 10*6/uL (ref 3.80–5.10)
RDW: 13 % (ref 11.0–15.0)
Total Lymphocyte: 28.4 %
WBC: 5.8 10*3/uL (ref 3.8–10.8)

## 2019-11-10 LAB — COMPREHENSIVE METABOLIC PANEL
AG Ratio: 1.5 (calc) (ref 1.0–2.5)
ALT: 12 U/L (ref 6–29)
AST: 17 U/L (ref 10–35)
Albumin: 3.8 g/dL (ref 3.6–5.1)
Alkaline phosphatase (APISO): 129 U/L (ref 37–153)
BUN: 9 mg/dL (ref 7–25)
CO2: 33 mmol/L — ABNORMAL HIGH (ref 20–32)
Calcium: 9.3 mg/dL (ref 8.6–10.4)
Chloride: 101 mmol/L (ref 98–110)
Creat: 0.6 mg/dL (ref 0.50–1.05)
Globulin: 2.6 g/dL (calc) (ref 1.9–3.7)
Glucose, Bld: 93 mg/dL (ref 65–99)
Potassium: 3.3 mmol/L — ABNORMAL LOW (ref 3.5–5.3)
Sodium: 140 mmol/L (ref 135–146)
Total Bilirubin: 1 mg/dL (ref 0.2–1.2)
Total Protein: 6.4 g/dL (ref 6.1–8.1)

## 2019-11-10 LAB — LIPID PANEL
Cholesterol: 135 mg/dL (ref <200)
HDL: 65 mg/dL (ref >=50)
LDL Cholesterol (Calc): 51 mg/dL (calc)
Non-HDL Cholesterol (Calc): 70 mg/dL (calc) (ref <130)
Total CHOL/HDL Ratio: 2.1 (calc) (ref <5.0)
Triglycerides: 107 mg/dL (ref <150)

## 2019-11-10 LAB — MICROALBUMIN / CREATININE URINE RATIO
Creatinine, Urine: 165 mg/dL (ref 20–275)
Microalb Creat Ratio: 5 mcg/mg creat (ref <30)
Microalb, Ur: 0.8 mg/dL

## 2019-11-10 LAB — VITAMIN B12: Vitamin B-12: 1539 pg/mL — ABNORMAL HIGH (ref 200–1100)

## 2019-11-10 LAB — TSH: TSH: 0.42 mIU/L (ref 0.40–4.50)

## 2019-11-13 ENCOUNTER — Other Ambulatory Visit: Payer: Self-pay

## 2019-11-13 MED ORDER — POTASSIUM CHLORIDE CRYS ER 20 MEQ PO TBCR
20.0000 meq | EXTENDED_RELEASE_TABLET | Freq: Every day | ORAL | 2 refills | Status: DC
Start: 2019-11-13 — End: 2020-10-07

## 2019-11-16 ENCOUNTER — Telehealth: Payer: Self-pay

## 2019-11-16 NOTE — Telephone Encounter (Signed)
Called regarding 10/7, moved time to 1:20 pm. She is aware of the time.

## 2019-11-22 ENCOUNTER — Other Ambulatory Visit: Payer: Self-pay

## 2019-11-22 ENCOUNTER — Inpatient Hospital Stay: Payer: Managed Care, Other (non HMO) | Attending: Hematology and Oncology | Admitting: Hematology and Oncology

## 2019-11-22 ENCOUNTER — Encounter: Payer: Self-pay | Admitting: Hematology and Oncology

## 2019-11-22 ENCOUNTER — Ambulatory Visit: Payer: Managed Care, Other (non HMO) | Admitting: Hematology and Oncology

## 2019-11-22 DIAGNOSIS — G620 Drug-induced polyneuropathy: Secondary | ICD-10-CM | POA: Insufficient documentation

## 2019-11-22 DIAGNOSIS — Z6832 Body mass index (BMI) 32.0-32.9, adult: Secondary | ICD-10-CM

## 2019-11-22 DIAGNOSIS — Z79899 Other long term (current) drug therapy: Secondary | ICD-10-CM | POA: Diagnosis not present

## 2019-11-22 DIAGNOSIS — E6609 Other obesity due to excess calories: Secondary | ICD-10-CM | POA: Insufficient documentation

## 2019-11-22 DIAGNOSIS — T451X5A Adverse effect of antineoplastic and immunosuppressive drugs, initial encounter: Secondary | ICD-10-CM

## 2019-11-22 DIAGNOSIS — Z885 Allergy status to narcotic agent status: Secondary | ICD-10-CM | POA: Insufficient documentation

## 2019-11-22 DIAGNOSIS — Z8572 Personal history of non-Hodgkin lymphomas: Secondary | ICD-10-CM | POA: Diagnosis not present

## 2019-11-22 DIAGNOSIS — Z8579 Personal history of other malignant neoplasms of lymphoid, hematopoietic and related tissues: Secondary | ICD-10-CM | POA: Diagnosis not present

## 2019-11-22 DIAGNOSIS — Z9221 Personal history of antineoplastic chemotherapy: Secondary | ICD-10-CM | POA: Insufficient documentation

## 2019-11-22 NOTE — Assessment & Plan Note (Signed)
I encouraged her to continue on aggressive dietary change and healthy living We discussed the importance of weight loss in association of obesity with lymphoma

## 2019-11-22 NOTE — Assessment & Plan Note (Signed)
Examination is unremarkable for disease recurrence I do not feel strongly we need to pursue repeat CT imaging in the absence of symptoms The patient is more than 6 years out, a long-term cancer survivor She is comfortable of returning here once a year for follow-up I educated the patient signs and symptoms to watch out for We discussed the risk of cancer recurrence in association with obesity We discussed the importance of screening programs

## 2019-11-22 NOTE — Assessment & Plan Note (Signed)
This is stable.  The patient also has history of vitamin B 12 deficiency and is taking chronic vitamin B 12 replacement therapy.

## 2019-11-22 NOTE — Progress Notes (Signed)
[place] OFFICE PROGRESS NOTE  Patient Care Team: Carollee Herter, Alferd Apa, DO as PCP - General Heath Lark, MD as Consulting Physician (Hematology and Oncology) Doran Stabler, MD as Consulting Physician (Gastroenterology) Garald Balding, MD as Consulting Physician (Orthopedic Surgery)  ASSESSMENT & PLAN:  History of lymphoma Examination is unremarkable for disease recurrence I do not feel strongly we need to pursue repeat CT imaging in the absence of symptoms The patient is more than 6 years out, a long-term cancer survivor She is comfortable of returning here once a year for follow-up I educated the patient signs and symptoms to watch out for We discussed the risk of cancer recurrence in association with obesity We discussed the importance of screening programs  Neuropathy due to chemotherapeutic drug [facility]) This is stable.  The patient also has history of vitamin B 12 deficiency and is taking chronic vitamin B 12 replacement therapy.  Class 1 obesity due to excess calories in adult I encouraged her to continue on aggressive dietary change and healthy living We discussed the importance of weight loss in association of obesity with lymphoma   No orders of the defined types were placed in this encounter.   All questions were answered. The patient knows to call the clinic with any problems, questions or concerns. The total time spent in the appointment was 20 minutes encounter with patients including review of chart and various tests results, discussions about plan of care and coordination of care plan   Heath Lark, MD 11/22/2019 2:22 PM  INTERVAL HISTORY: Please see below for problem oriented charting. She returns for further follow-up The last time I saw her, she was in the process of relocating to [facility] She actually moved back She wants to reestablish care Denies recent infection, fever or chills No new lymphadenopathy No recent new diagnosis,  illness or surgery since her last visit She has some very mild residual peripheral neuropathy from prior chemo SUMMARY OF ONCOLOGIC HISTORY: Oncology History Overview Note  Anaplastic Lymphoma, T-cell, 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)   Staging method: AJCC 7th Edition   Clinical: Stage II signed by Heath Lark, MD on 04/03/2013  3:31 PM   Summary: Stage II    History of lymphoma  02/07/2013 Imaging   [place] confirmed enlarged LN   03/09/2013 Procedure   LN biopsy confirmed T cell lymphoma   03/23/2013 Procedure   She has port placement   03/26/2013 Imaging   Echo showed normal ejection fraction   03/29/2013 Bone Marrow Biopsy   Bone marrow biopsy was negative   04/02/2013 Imaging   PET/CT scan showed localized, stage II disease   04/04/2013 - 07/20/2013 Chemotherapy   She has completed 6 cycles of CHOP plus etoposide chemotherapy.   06/01/2013 Imaging   PET CT scan show complete response to treatment   06/27/2013 Adverse Reaction   Dose of vincristine was reduced due to peripheral neuropathy.   08/31/2013 Imaging   Repeat PET scans show no evidence of disease.   06/10/2014 Imaging   CT chest showed no evidence of recurrence     REVIEW OF SYSTEMS:   Constitutional: Denies fevers, chills or abnormal weight loss Eyes: Denies blurriness of vision Ears, nose, mouth, throat, and face: Denies mucositis or sore throat Respiratory: Denies cough, dyspnea or wheezes Cardiovascular: Denies palpitation, chest discomfort or lower extremity swelling Gastrointestinal:  Denies nausea, heartburn or change in bowel habits Skin: Denies abnormal skin rashes Lymphatics:  Denies new lymphadenopathy or easy bruising Behavioral/Psych: Mood is stable, no new changes  All other systems were reviewed with the patient and are negative.  I have reviewed the past medical history, past surgical history, social history and family history with the patient and they are  unchanged from previous note.  ALLERGIES:  is allergic to betadine [povidone iodine] and codeine.  MEDICATIONS:  Current Outpatient Medications  Medication Sig Dispense Refill  . ALPRAZolam (XANAX) 0.25 MG tablet Take 1 tablet (0.25 mg total) by mouth 2 (two) times daily as needed for anxiety. 20 tablet 0  . atorvastatin (LIPITOR) 20 MG tablet Take 1 tablet (20 mg total) by mouth daily. 90 tablet 1  . Cholecalciferol (VITAMIN D3 SUPER STRENGTH) 50 MCG (2000 UT) CAPS Take 2,000 Units by mouth daily.    Marland Kitchen docusate sodium (COLACE) 100 MG capsule Take 200 mg by mouth in the morning.    Marland Kitchen erythromycin ophthalmic ointment Place 1 application into both eyes 3 (three) times daily. 3.5 g 0  . FLUoxetine (PROZAC) 40 MG capsule Take 1 capsule (40 mg total) by mouth daily. 90 capsule 3  . hydrochlorothiazide (HYDRODIURIL) 25 MG tablet Take 1 tablet (25 mg total) by mouth daily. 90 tablet 3  . NON FORMULARY Take 1 tablet by mouth See admin instructions. Nature Made Super C Immune Complex tablets- Take 1 tablet by mouth once a day    . potassium chloride SA (KLOR-CON) 20 MEQ tablet Take 1 tablet (20 mEq total) by mouth daily. 60 tablet 2  . valACYclovir (VALTREX) 1000 MG tablet Take 1 tablet (1,000 mg total) by mouth 3 (three) times daily. 90 tablet 1  . vitamin B-12 (CYANOCOBALAMIN) 1000 MCG tablet Take 1,000 mcg by mouth daily.     No current facility-administered medications for this visit.    PHYSICAL EXAMINATION: ECOG PERFORMANCE STATUS: 1 - Symptomatic but completely ambulatory  Vitals:   11/22/19 1316  BP: 131/71  Pulse: 68  Resp: 18  Temp: 98.1 F (36.7 C)  SpO2: 100%   Filed Weights   11/22/19 1316  Weight: 213 lb 6.4 oz (96.8 kg)    GENERAL:alert, no distress and comfortable SKIN: skin color, texture, turgor are normal, no rashes or significant lesions EYES: normal, Conjunctiva are pink and non-injected, sclera clear OROPHARYNX:no exudate, no erythema and lips, buccal mucosa,  and tongue normal  NECK: supple, thyroid normal size, non-tender, without nodularity LYMPH:  no palpable lymphadenopathy in the cervical, axillary or inguinal LUNGS: clear to auscultation and percussion with normal breathing effort HEART: regular rate & rhythm and no murmurs and no lower extremity edema ABDOMEN:abdomen soft, non-tender and normal bowel sounds Musculoskeletal:no cyanosis of digits and no clubbing  NEURO: alert & oriented x 3 with fluent speech, no focal motor/sensory deficits  LABORATORY DATA:  I have reviewed the data as listed    Component Value Date/Time   NA 140 11/09/2019 1434   NA 141 11/18/2016 0754   K 3.3 (L) 11/09/2019 1434   K 4.0 11/18/2016 0754   CL 101 11/09/2019 1434   CO2 33 (H) 11/09/2019 1434   CO2 25 11/18/2016 0754   GLUCOSE 93 11/09/2019 1434   GLUCOSE 90 11/18/2016 0754   BUN 9 11/09/2019 1434   BUN 14.1 11/18/2016 0754   CREATININE 0.60 11/09/2019 1434   CREATININE 0.8 11/18/2016 0754   CALCIUM 9.3 11/09/2019 1434   CALCIUM 9.5 11/18/2016 0754   PROT 6.4 11/09/2019 1434   PROT 6.7 11/18/2016 0754   ALBUMIN  3.5 11/17/2017 1247   ALBUMIN 3.5 11/18/2016 0754   AST 17 11/09/2019 1434   AST 14 11/18/2016 0754   ALT 12 11/09/2019 1434   ALT 19 11/18/2016 0754   ALKPHOS 175 (H) 11/17/2017 1247   ALKPHOS 143 11/18/2016 0754   BILITOT 1.0 11/09/2019 1434   BILITOT 0.46 11/18/2016 0754   GFRNONAA >60 07/28/2019 1102   GFRAA >60 07/28/2019 1102    No results found for: SPEP, UPEP  Lab Results  Component Value Date   WBC 5.8 11/09/2019   NEUTROABS 3,503 11/09/2019   HGB 13.1 11/09/2019   HCT 40.0 11/09/2019   MCV 87.9 11/09/2019   PLT 292 11/09/2019      Chemistry      Component Value Date/Time   NA 140 11/09/2019 1434   NA 141 11/18/2016 0754   K 3.3 (L) 11/09/2019 1434   K 4.0 11/18/2016 0754   CL 101 11/09/2019 1434   CO2 33 (H) 11/09/2019 1434   CO2 25 11/18/2016 0754   BUN 9 11/09/2019 1434   BUN 14.1 11/18/2016 0754    CREATININE 0.60 11/09/2019 1434   CREATININE 0.8 11/18/2016 0754      Component Value Date/Time   CALCIUM 9.3 11/09/2019 1434   CALCIUM 9.5 11/18/2016 0754   ALKPHOS 175 (H) 11/17/2017 1247   ALKPHOS 143 11/18/2016 0754   AST 17 11/09/2019 1434   AST 14 11/18/2016 0754   ALT 12 11/09/2019 1434   ALT 19 11/18/2016 0754   BILITOT 1.0 11/09/2019 1434   BILITOT 0.46 11/18/2016 0754

## 2019-11-26 ENCOUNTER — Telehealth: Payer: Self-pay | Admitting: Hematology and Oncology

## 2019-11-26 NOTE — Telephone Encounter (Signed)
Scheduled appt per 10/7 sch msg- mailed reminder letter with appt date and time

## 2019-11-30 ENCOUNTER — Other Ambulatory Visit: Payer: Self-pay | Admitting: Family Medicine

## 2019-11-30 DIAGNOSIS — F410 Panic disorder [episodic paroxysmal anxiety] without agoraphobia: Secondary | ICD-10-CM

## 2019-12-01 NOTE — Telephone Encounter (Signed)
Requesting: xanax Contract:11/29/19 UDS:08/02/19 Last Visit:11/09/19 Next Visit:n/a Last Refill:08/02/19  Please Advise

## 2019-12-03 MED ORDER — ALPRAZOLAM 0.25 MG PO TABS: 0.2500 mg | ORAL_TABLET | Freq: Two times a day (BID) | ORAL | 0 refills | Status: DC | PRN

## 2019-12-06 ENCOUNTER — Ambulatory Visit: Payer: Managed Care, Other (non HMO) | Admitting: Internal Medicine

## 2019-12-25 ENCOUNTER — Emergency Department (HOSPITAL_COMMUNITY): Payer: Managed Care, Other (non HMO)

## 2019-12-25 ENCOUNTER — Other Ambulatory Visit: Payer: Self-pay

## 2019-12-25 ENCOUNTER — Observation Stay (HOSPITAL_COMMUNITY): Payer: Managed Care, Other (non HMO)

## 2019-12-25 ENCOUNTER — Encounter (HOSPITAL_COMMUNITY): Payer: Self-pay

## 2019-12-25 ENCOUNTER — Encounter (HOSPITAL_COMMUNITY)
Admission: EM | Disposition: A | Discharge status: Home / Self Care | Payer: Self-pay | Source: Home / Self Care | Attending: Emergency Medicine

## 2019-12-25 ENCOUNTER — Observation Stay (HOSPITAL_COMMUNITY): Payer: Managed Care, Other (non HMO) | Admitting: Certified Registered"

## 2019-12-25 ENCOUNTER — Observation Stay (HOSPITAL_COMMUNITY)
Admission: EM | Admit: 2019-12-25 | Discharge: 2019-12-27 | Disposition: A | Discharge status: Home / Self Care | Payer: Managed Care, Other (non HMO) | Attending: Orthopedic Surgery | Admitting: Orthopedic Surgery

## 2019-12-25 DIAGNOSIS — S6991XA Unspecified injury of right wrist, hand and finger(s), initial encounter: Secondary | ICD-10-CM | POA: Diagnosis present

## 2019-12-25 DIAGNOSIS — S52611A Displaced fracture of right ulna styloid process, initial encounter for closed fracture: Secondary | ICD-10-CM | POA: Diagnosis not present

## 2019-12-25 DIAGNOSIS — Y9301 Activity, walking, marching and hiking: Secondary | ICD-10-CM | POA: Insufficient documentation

## 2019-12-25 DIAGNOSIS — S82891A Other fracture of right lower leg, initial encounter for closed fracture: Secondary | ICD-10-CM

## 2019-12-25 DIAGNOSIS — W108XXA Fall (on) (from) other stairs and steps, initial encounter: Secondary | ICD-10-CM | POA: Insufficient documentation

## 2019-12-25 DIAGNOSIS — W19XXXA Unspecified fall, initial encounter: Secondary | ICD-10-CM

## 2019-12-25 DIAGNOSIS — Z79899 Other long term (current) drug therapy: Secondary | ICD-10-CM | POA: Insufficient documentation

## 2019-12-25 DIAGNOSIS — I10 Essential (primary) hypertension: Secondary | ICD-10-CM | POA: Insufficient documentation

## 2019-12-25 DIAGNOSIS — J45909 Unspecified asthma, uncomplicated: Secondary | ICD-10-CM | POA: Diagnosis not present

## 2019-12-25 DIAGNOSIS — S52501A Unspecified fracture of the lower end of right radius, initial encounter for closed fracture: Secondary | ICD-10-CM | POA: Diagnosis present

## 2019-12-25 DIAGNOSIS — Y92019 Unspecified place in single-family (private) house as the place of occurrence of the external cause: Secondary | ICD-10-CM | POA: Diagnosis not present

## 2019-12-25 DIAGNOSIS — S52511A Displaced fracture of right radial styloid process, initial encounter for closed fracture: Secondary | ICD-10-CM | POA: Diagnosis not present

## 2019-12-25 DIAGNOSIS — Z20822 Contact with and (suspected) exposure to covid-19: Secondary | ICD-10-CM | POA: Insufficient documentation

## 2019-12-25 DIAGNOSIS — S62101A Fracture of unspecified carpal bone, right wrist, initial encounter for closed fracture: Secondary | ICD-10-CM | POA: Diagnosis present

## 2019-12-25 DIAGNOSIS — Z8572 Personal history of non-Hodgkin lymphomas: Secondary | ICD-10-CM | POA: Insufficient documentation

## 2019-12-25 HISTORY — PX: ORIF WRIST FRACTURE: SHX2133

## 2019-12-25 LAB — RESPIRATORY PANEL BY RT PCR (FLU A&B, COVID)
Influenza A by PCR: NEGATIVE
Influenza B by PCR: NEGATIVE
SARS Coronavirus 2 by RT PCR: POSITIVE — AB

## 2019-12-25 LAB — CBC
HCT: 37.7 % (ref 36.0–46.0)
Hemoglobin: 12.2 g/dL (ref 12.0–15.0)
MCH: 28.9 pg (ref 26.0–34.0)
MCHC: 32.4 g/dL (ref 30.0–36.0)
MCV: 89.3 fL (ref 80.0–100.0)
Platelets: 236 K/uL (ref 150–400)
RBC: 4.22 MIL/uL (ref 3.87–5.11)
RDW: 13.6 % (ref 11.5–15.5)
WBC: 8.1 K/uL (ref 4.0–10.5)
nRBC: 0 % (ref 0.0–0.2)

## 2019-12-25 LAB — BASIC METABOLIC PANEL WITH GFR
Anion gap: 5 (ref 5–15)
BUN: 12 mg/dL (ref 6–20)
CO2: 28 mmol/L (ref 22–32)
Calcium: 8.9 mg/dL (ref 8.9–10.3)
Chloride: 105 mmol/L (ref 98–111)
Creatinine, Ser: 0.76 mg/dL (ref 0.44–1.00)
GFR, Estimated: >60 mL/min (ref >60)
Glucose, Bld: 115 mg/dL — ABNORMAL HIGH (ref 70–99)
Potassium: 3.3 mmol/L — ABNORMAL LOW (ref 3.5–5.1)
Sodium: 138 mmol/L (ref 135–145)

## 2019-12-25 SURGERY — OPEN REDUCTION INTERNAL FIXATION (ORIF) WRIST FRACTURE
Anesthesia: Regional | Site: Wrist | Laterality: Right

## 2019-12-25 MED ORDER — DEXAMETHASONE SODIUM PHOSPHATE 10 MG/ML IJ SOLN
INTRAMUSCULAR | Status: AC
  Filled 2019-12-25: qty 2

## 2019-12-25 MED ORDER — METHOCARBAMOL 1000 MG/10ML IJ SOLN
500.0000 mg | Freq: Four times a day (QID) | INTRAVENOUS | Status: DC | PRN
  Filled 2019-12-25: qty 5

## 2019-12-25 MED ORDER — MORPHINE SULFATE (PF) 2 MG/ML IV SOLN
2.0000 mg | INTRAVENOUS | Status: DC | PRN
  Administered 2019-12-25 (×3): 2 mg via INTRAVENOUS
  Filled 2019-12-25 (×3): qty 1

## 2019-12-25 MED ORDER — CEFAZOLIN SODIUM-DEXTROSE 2-3 GM-%(50ML) IV SOLR
INTRAVENOUS | Status: DC | PRN
  Administered 2019-12-25: 2 g via INTRAVENOUS

## 2019-12-25 MED ORDER — ROCURONIUM BROMIDE 10 MG/ML (PF) SYRINGE
PREFILLED_SYRINGE | INTRAVENOUS | Status: AC
  Filled 2019-12-25: qty 20

## 2019-12-25 MED ORDER — ONDANSETRON HCL 4 MG PO TABS: 4.0000 mg | ORAL_TABLET | Freq: Four times a day (QID) | ORAL | Status: DC | PRN

## 2019-12-25 MED ORDER — BUPIVACAINE-EPINEPHRINE (PF) 0.5% -1:200000 IJ SOLN
INTRAMUSCULAR | Status: DC | PRN
  Administered 2019-12-25: 30 mL via PERINEURAL

## 2019-12-25 MED ORDER — LACTATED RINGERS IV SOLN: INTRAVENOUS | Status: DC | PRN

## 2019-12-25 MED ORDER — PROPOFOL 10 MG/ML IV BOLUS
INTRAVENOUS | Status: AC
  Filled 2019-12-25: qty 40

## 2019-12-25 MED ORDER — ONDANSETRON HCL 4 MG/2ML IJ SOLN
INTRAMUSCULAR | Status: AC
  Filled 2019-12-25: qty 8

## 2019-12-25 MED ORDER — PHENYLEPHRINE HCL (PRESSORS) 10 MG/ML IV SOLN
INTRAVENOUS | Status: DC | PRN
  Administered 2019-12-25 (×2): 80 ug via INTRAVENOUS
  Administered 2019-12-25: 40 ug via INTRAVENOUS
  Administered 2019-12-25: 80 ug via INTRAVENOUS

## 2019-12-25 MED ORDER — POTASSIUM CHLORIDE IN NACL 20-0.45 MEQ/L-% IV SOLN
INTRAVENOUS | Status: DC
  Filled 2019-12-25: qty 1000

## 2019-12-25 MED ORDER — LIDOCAINE 2% (20 MG/ML) 5 ML SYRINGE
INTRAMUSCULAR | Status: AC
  Filled 2019-12-25: qty 5

## 2019-12-25 MED ORDER — METHOCARBAMOL 500 MG PO TABS
500.0000 mg | ORAL_TABLET | Freq: Four times a day (QID) | ORAL | Status: DC | PRN
  Administered 2019-12-25: 500 mg via ORAL
  Filled 2019-12-25: qty 1

## 2019-12-25 MED ORDER — OXYCODONE HCL 5 MG PO TABS
5.0000 mg | ORAL_TABLET | ORAL | Status: DC | PRN
  Administered 2019-12-25: 10 mg via ORAL
  Administered 2019-12-25 (×2): 15 mg via ORAL
  Filled 2019-12-25: qty 3
  Filled 2019-12-25: qty 2
  Filled 2019-12-25: qty 3

## 2019-12-25 MED ORDER — EPHEDRINE 5 MG/ML INJ
INTRAVENOUS | Status: AC
  Filled 2019-12-25: qty 10

## 2019-12-25 MED ORDER — SODIUM CHLORIDE 0.9 % IV SOLN: INTRAVENOUS | Status: DC

## 2019-12-25 MED ORDER — FENTANYL CITRATE (PF) 100 MCG/2ML IJ SOLN
INTRAMUSCULAR | Status: DC | PRN
  Administered 2019-12-25: 50 ug via INTRAVENOUS

## 2019-12-25 MED ORDER — PHENYLEPHRINE 40 MCG/ML (10ML) SYRINGE FOR IV PUSH (FOR BLOOD PRESSURE SUPPORT)
PREFILLED_SYRINGE | INTRAVENOUS | Status: AC
  Filled 2019-12-25: qty 20

## 2019-12-25 MED ORDER — CEFAZOLIN SODIUM-DEXTROSE 2-4 GM/100ML-% IV SOLN: 2.0000 g | INTRAVENOUS | Status: DC

## 2019-12-25 MED ORDER — ONDANSETRON HCL 4 MG/2ML IJ SOLN
INTRAMUSCULAR | Status: DC | PRN
  Administered 2019-12-25: 4 mg via INTRAVENOUS

## 2019-12-25 MED ORDER — SUCCINYLCHOLINE CHLORIDE 200 MG/10ML IV SOSY
PREFILLED_SYRINGE | INTRAVENOUS | Status: AC
  Filled 2019-12-25: qty 20

## 2019-12-25 MED ORDER — FENTANYL CITRATE (PF) 250 MCG/5ML IJ SOLN
INTRAMUSCULAR | Status: AC
  Filled 2019-12-25: qty 5

## 2019-12-25 MED ORDER — FENTANYL CITRATE (PF) 100 MCG/2ML IJ SOLN
50.0000 ug | Freq: Once | INTRAMUSCULAR | Status: AC
  Administered 2019-12-25: 50 ug via INTRAVENOUS
  Filled 2019-12-25: qty 2

## 2019-12-25 MED ORDER — ONDANSETRON HCL 4 MG/2ML IJ SOLN
4.0000 mg | Freq: Four times a day (QID) | INTRAMUSCULAR | Status: DC | PRN
  Administered 2019-12-25: 4 mg via INTRAVENOUS
  Filled 2019-12-25: qty 2

## 2019-12-25 MED ORDER — DEXAMETHASONE SODIUM PHOSPHATE 10 MG/ML IJ SOLN
INTRAMUSCULAR | Status: DC | PRN
  Administered 2019-12-25: 5 mg via INTRAVENOUS

## 2019-12-25 MED ORDER — CHLORHEXIDINE GLUCONATE 4 % EX LIQD
60.0000 mL | Freq: Once | CUTANEOUS | Status: DC
  Filled 2019-12-25: qty 60

## 2019-12-25 MED ORDER — MIDAZOLAM HCL 5 MG/5ML IJ SOLN
INTRAMUSCULAR | Status: DC | PRN
  Administered 2019-12-25: 2 mg via INTRAVENOUS

## 2019-12-25 MED ORDER — MIDAZOLAM HCL 2 MG/2ML IJ SOLN
INTRAMUSCULAR | Status: AC
  Filled 2019-12-25: qty 2

## 2019-12-25 MED ORDER — PROPOFOL 500 MG/50ML IV EMUL
INTRAVENOUS | Status: DC | PRN
  Administered 2019-12-25: 50 ug/kg/min via INTRAVENOUS

## 2019-12-25 SURGICAL SUPPLY — 66 items
BIT DRILL 2.2 SS TIBIAL (BIT) ×3 IMPLANT
BLADE CLIPPER SURG (BLADE) IMPLANT
BNDG ELASTIC 3X5.8 VLCR STR LF (GAUZE/BANDAGES/DRESSINGS) ×3 IMPLANT
BNDG ELASTIC 4X5.8 VLCR STR LF (GAUZE/BANDAGES/DRESSINGS) ×3 IMPLANT
BNDG ESMARK 4X9 LF (GAUZE/BANDAGES/DRESSINGS) ×3 IMPLANT
BNDG GAUZE ELAST 4 BULKY (GAUZE/BANDAGES/DRESSINGS) ×3 IMPLANT
CANISTER SUCT 3000ML PPV (MISCELLANEOUS) IMPLANT
CORD BIPOLAR FORCEPS 12FT (ELECTRODE) ×3 IMPLANT
COVER SURGICAL LIGHT HANDLE (MISCELLANEOUS) ×3 IMPLANT
COVER WAND RF STERILE (DRAPES) IMPLANT
CUFF TOURN SGL QUICK 18X4 (TOURNIQUET CUFF) ×3 IMPLANT
CUFF TOURN SGL QUICK 24 (TOURNIQUET CUFF)
CUFF TRNQT CYL 24X4X16.5-23 (TOURNIQUET CUFF) IMPLANT
DRAIN TLS ROUND 10FR (DRAIN) IMPLANT
DRAPE OEC MINIVIEW 54X84 (DRAPES) ×3 IMPLANT
DRAPE SURG 17X23 STRL (DRAPES) ×3 IMPLANT
DRSG ADAPTIC 3X8 NADH LF (GAUZE/BANDAGES/DRESSINGS) ×3 IMPLANT
GAUZE SPONGE 4X4 12PLY STRL (GAUZE/BANDAGES/DRESSINGS) IMPLANT
GAUZE SPONGE 4X4 12PLY STRL LF (GAUZE/BANDAGES/DRESSINGS) ×3 IMPLANT
GAUZE XEROFORM 1X8 LF (GAUZE/BANDAGES/DRESSINGS) IMPLANT
GAUZE XEROFORM 5X9 LF (GAUZE/BANDAGES/DRESSINGS) ×3 IMPLANT
GLOVE BIOGEL PI IND STRL 8 (GLOVE) ×2 IMPLANT
GLOVE BIOGEL PI INDICATOR 8 (GLOVE) ×4
GLOVE SS BIOGEL STRL SZ 8 (GLOVE) ×1 IMPLANT
GLOVE SUPERSENSE BIOGEL SZ 8 (GLOVE) ×2
GOWN STRL REUS W/ TWL LRG LVL3 (GOWN DISPOSABLE) ×1 IMPLANT
GOWN STRL REUS W/ TWL XL LVL3 (GOWN DISPOSABLE) ×2 IMPLANT
GOWN STRL REUS W/TWL LRG LVL3 (GOWN DISPOSABLE) ×3
GOWN STRL REUS W/TWL XL LVL3 (GOWN DISPOSABLE) ×6
KIT BASIN OR (CUSTOM PROCEDURE TRAY) ×3 IMPLANT
KIT TURNOVER KIT B (KITS) ×3 IMPLANT
LOOP VESSEL MAXI BLUE (MISCELLANEOUS) IMPLANT
MANIFOLD NEPTUNE II (INSTRUMENTS) ×3 IMPLANT
NEEDLE 22X1 1/2 (OR ONLY) (NEEDLE) IMPLANT
NS IRRIG 1000ML POUR BTL (IV SOLUTION) ×3 IMPLANT
PACK ORTHO EXTREMITY (CUSTOM PROCEDURE TRAY) ×3 IMPLANT
PAD ARMBOARD 7.5X6 YLW CONV (MISCELLANEOUS) ×3 IMPLANT
PAD CAST 3X4 CTTN HI CHSV (CAST SUPPLIES) ×1 IMPLANT
PAD CAST 4YDX4 CTTN HI CHSV (CAST SUPPLIES) ×1 IMPLANT
PADDING CAST COTTON 3X4 STRL (CAST SUPPLIES) ×3
PADDING CAST COTTON 4X4 STRL (CAST SUPPLIES) ×3
PEG LOCKING SMOOTH 2.2X16 (Screw) ×3 IMPLANT
PEG LOCKING SMOOTH 2.2X18 (Peg) ×3 IMPLANT
PEG LOCKING SMOOTH 2.2X20 (Screw) ×15 IMPLANT
PILLOW ARM CARTER ADULT (MISCELLANEOUS) ×3 IMPLANT
PLATE RIGHT VOLAR RIM DVR (Plate) ×3 IMPLANT
PUTTY DBM STAGRAFT PLUS 2CC (Putty) ×3 IMPLANT
SCREW LOCK 14X2.7X 3 LD TPR (Screw) ×3 IMPLANT
SCREW LOCKING 2.7X13MM (Screw) ×6 IMPLANT
SCREW LOCKING 2.7X14 (Screw) ×9 IMPLANT
SOAP 2 % CHG 4 OZ (WOUND CARE) ×6 IMPLANT
SOL PREP POV-IOD 4OZ 10% (MISCELLANEOUS) IMPLANT
SPONGE LAP 4X18 RFD (DISPOSABLE) IMPLANT
SUT MNCRL AB 4-0 PS2 18 (SUTURE) IMPLANT
SUT PROLENE 3 0 PS 2 (SUTURE) IMPLANT
SUT PROLENE 4 0 PS 2 18 (SUTURE) ×9 IMPLANT
SUT VIC AB 3-0 FS2 27 (SUTURE) ×3 IMPLANT
SYR CONTROL 10ML LL (SYRINGE) IMPLANT
SYSTEM CHEST DRAIN TLS 7FR (DRAIN) IMPLANT
TOWEL GREEN STERILE (TOWEL DISPOSABLE) ×3 IMPLANT
TOWEL GREEN STERILE FF (TOWEL DISPOSABLE) ×3 IMPLANT
TUBE CONNECTING 12'X1/4 (SUCTIONS) ×1
TUBE CONNECTING 12X1/4 (SUCTIONS) ×2 IMPLANT
TUBE EVACUATION TLS (MISCELLANEOUS) IMPLANT
UNDERPAD 30X36 HEAVY ABSORB (UNDERPADS AND DIAPERS) ×3 IMPLANT
WATER STERILE IRR 1000ML POUR (IV SOLUTION) ×3 IMPLANT

## 2019-12-25 NOTE — ED Triage Notes (Signed)
Pt states that she lost her balance this morning, falling on 3 steps in house. No LOC, did not hit head. Obvious right wrist deformity, swelling noted in right ankle. Pt states she did ambulate to the car after injury.

## 2019-12-25 NOTE — ED Notes (Signed)
Pt has results on phone email of testing Covid + Sept 8, tested at [place], Heath Lark  RN short stay called and has requested a copy to be faxed for her records. Writer called OR to update Dr Amedeo Plenty when he completes the surgery he is presently in. Heath Lark will call back with time to transfer pt to Short stay when he finds out time and receives copy of results. He has phone number for CN to contact directly.

## 2019-12-25 NOTE — Consult Note (Signed)
Reason for Consult:Right wrist fx Referring Physician: D Jackson  Anna Jackson is an 59 y.o. female.  HPI: Anna Jackson was coming down her stairs and lost her balance and fell about 3 stairs. She had immediate right wrist and ankle pain. She was brought to the ED at [facility]. X-rays showed a right wrist fx and severe right ankle sprain and hand surgery was consulted. She is RHD and works in [employer].  Past Medical History:  Diagnosis Date   Adenopathy    left axillary   Anemia    Anxiety    Anxiety state, unspecified    Asthma    no problem with asthma in several years   Barrett esophagus    Depression    Esophageal reflux    Headache 05/16/2013   Herpes simplex without mention of complication    Hyperlipidemia 03/12/2014   Hypertension    Lymphoma, T-cell ([place]) 03/20/2013   Mental disorder    Neuropathic pain 04/04/2014   Non Hodgkin's lymphoma ([place]) 2015   Skin infection 11/02/2013   Unspecified asthma(493.90)    Unspecified essential hypertension    URI (upper respiratory infection) 01/15/2014    Past Surgical History:  Procedure Laterality Date   AXILLARY LYMPH NODE DISSECTION Left 03/09/2013   Procedure: EXCISION LEFT AXILLARY LYMPH NODES;  Surgeon: [name], MD;  Location: [facility] ORS;  Service: General;  Laterality: Left;   BREAST BIOPSY Left 02/13/2013   high risk   BREAST EXCISIONAL BIOPSY Left 03/09/2013   high risk   CERVICAL CONIZATION W/BX     CHOLECYSTECTOMY  1995   Open, done with Hiatal hernia repair   ESOPHAGEAL MANOMETRY N/A 01/12/2017   Procedure: ESOPHAGEAL MANOMETRY (EM);  Surgeon: Doran Stabler, MD;  Location: [facility] ENDOS[facility];  Service: Gastroenterology;  Laterality: N/A;   INCISIONAL HERNIA REPAIR  2004   Dr Nedra Hai.  Parietex 20x15cm mesh   KNEE SURGERY Right 02/2010   Dr Shellia Carwin, Arthroscopic   NISSEN FUNDOPLICATION  [id number]   OPEN, Dr Lennie Hummer   PORTACATH PLACEMENT N/A 03/23/2013   Procedure: INSERTION  PORT-A-CATH;  Surgeon: [name], MD;  Location: [facility];  Service: General;  Laterality: N/A;   TUBAL LIGATION     VAGINAL HYSTERECTOMY  2002   Dr Art Raphael Gibney    Family History  Problem Relation Age of Onset   Heart disease Mother    Coronary artery disease Other        with hernia repair   Migraines Other    Hypertension Other    Breast cancer Maternal Grandmother 48   Cancer Maternal Grandmother 50       breast   Heart disease Brother    Alcoholism Brother    Colon cancer Neg Hx     Social History:  reports that she has never smoked. She has never used smokeless tobacco. She reports current alcohol use. She reports that she does not use drugs.  Allergies:  Allergies  Allergen Reactions   Betadine [Povidone Iodine] Hives and Itching   Codeine Nausea And Vomiting    Medications: I have reviewed the patient's current medications.  Results for orders placed or performed during the hospital encounter of 12/25/19 (from the past 48 hour(s))  Basic metabolic panel     Status: Abnormal   Collection Time: 12/25/19  8:39 AM  Result Value Ref Range   Sodium 138 135 - 145 mmol/L   Potassium 3.3 (L) 3.5 - 5.1 mmol/L   Chloride 105  98 - 111 mmol/L   CO2 28 22 - 32 mmol/L   Glucose, Bld 115 (H) 70 - 99 mg/dL    Comment: Glucose reference range applies only to samples taken after fasting for at least 8 hours.   BUN 12 6 - 20 mg/dL   Creatinine, Ser 0.76 0.44 - 1.00 mg/dL   Calcium 8.9 8.9 - 10.3 mg/dL   GFR, Estimated >60 >60 mL/min    Comment: (NOTE) Calculated using the CKD-EPI Creatinine Equation (2021)    Anion gap 5 5 - 15    Comment: Performed at [facility], [place] [address] [place], [place] [address]  CBC     Status: None   Collection Time: 12/25/19  8:39 AM  Result Value Ref Range   WBC 8.1 4.0 - 10.5 K/uL   RBC 4.22 3.87 - 5.11 MIL/uL   Hemoglobin 12.2 12.0 - 15.0 g/dL   HCT 37.7 36 - 46 %   MCV 89.3 80.0 - 100.0 fL    MCH 28.9 26.0 - 34.0 pg   MCHC 32.4 30.0 - 36.0 g/dL   RDW 13.6 11.5 - 15.5 %   Platelets 236 150 - 400 K/uL   nRBC 0.0 0.0 - 0.2 %    Comment: Performed at [facility], [place] [address] [place], [place] [address]    DG Wrist Complete Right  Result Date: 12/25/2019 CLINICAL DATA:  Fall. EXAM: RIGHT WRIST - COMPLETE 3+ VIEW COMPARISON:  None. FINDINGS: Comminuted fracture distal radius with 1 shaft of ventral displacement. Fracture extends into the wrist joint. Avulsion of the ulnar styloid. Carpal bones intact. IMPRESSION: Comminuted and displaced fracture distal radius. Fracture ulnar styloid. Electronically Signed   By: Franchot Gallo M.Anna.   On: 12/25/2019 09:27   DG Ankle Complete Right  Result Date: 12/25/2019 CLINICAL DATA:  [place] down steps EXAM: RIGHT ANKLE - COMPLETE 3+ VIEW COMPARISON:  None. FINDINGS: Small avulsion fracture of the dorsal talus appears acute. Nondisplaced fracture distal fibula. Ankle joint space normal. Mild calcaneal spurring. IMPRESSION: Acute avulsion fracture of the dorsal talus. Nondisplaced fracture distal fibula. Electronically Signed   By: Franchot Gallo M.Anna.   On: 12/25/2019 09:29   DG Chest Port 1 View  Result Date: 12/25/2019 CLINICAL DATA:  Preop. EXAM: PORTABLE CHEST 1 VIEW COMPARISON:  Chest radiographs 07/27/2019 and cardiac CT 07/28/2019 FINDINGS: The patient is slightly rotated to the right. The cardiomediastinal silhouette is within normal limits. No confluent airspace opacity, edema, sizable pleural effusion, or pneumothorax is identified. Surgical clips are noted in the left axilla and upper abdomen. No acute osseous abnormality is seen. IMPRESSION: No active disease. Electronically Signed   By: Logan Bores M.Anna.   On: 12/25/2019 10:02   DG Knee Complete 4 Views Right  Result Date: 12/25/2019 CLINICAL DATA:  [place] [address] down stairs EXAM: RIGHT KNEE - COMPLETE 4+ VIEW COMPARISON:  None. FINDINGS: Negative for fracture or joint effusion  Moderate joint space narrowing and spurring in the medial joint compartment. Mild lateral joint space spurring. IMPRESSION: Negative for fracture. Electronically Signed   By: Franchot Gallo M.Anna.   On: 12/25/2019 09:31    Review of Systems  HENT: Negative for ear discharge, ear pain, hearing loss and tinnitus.   Eyes: Negative for photophobia and pain.  Respiratory: Negative for cough and shortness of breath.   Cardiovascular: Negative for chest pain.  Gastrointestinal: Negative for abdominal pain, nausea and vomiting.  Genitourinary: Negative for dysuria, flank pain, frequency and  urgency.  Musculoskeletal: Positive for arthralgias (Right wrist>right ankle). Negative for back pain, myalgias and neck pain.  Neurological: Negative for dizziness and headaches.  Hematological: Does not bruise/bleed easily.  Psychiatric/Behavioral: The patient is not nervous/anxious.    Blood pressure (!) 147/80, pulse (!) 56, temperature 97.8 F (36.6 C), temperature source Oral, resp. rate 16, height 5\' 8"  (1.727 m), weight 95.7 kg, SpO2 100 %. Physical Exam Constitutional:      General: She is not in acute distress.    Appearance: She is well-developed. She is not diaphoretic.  HENT:     Head: Normocephalic and atraumatic.  Eyes:     General: No scleral icterus.       Right eye: No discharge.        Left eye: No discharge.     Conjunctiva/sclera: Conjunctivae normal.  Cardiovascular:     Rate and Rhythm: Normal rate and regular rhythm.  Pulmonary:     Effort: Pulmonary effort is normal. No respiratory distress.  Musculoskeletal:     Cervical back: Normal range of motion.     Comments: Right shoulder, elbow, wrist, digits- no skin wounds, wrist deformity, severe TTP, no instability, no blocks to motion  Sens  Ax/R intact, M/U paresthetic  Mot   Ax/ R/ PIN/ M/ AIN/ U grossly intact  Rad 2+  LLE Mild knee abrasion, no ecchymosis or rash  Severe ankle edema, mod TTP  No knee effusion  Knee stable  to varus/ valgus and anterior/posterior stress  Sens DPN, SPN, TN intact  Motor EHL, ext, flex, evers 5/5  DP 2+, PT 1+, No significant edema  Skin:    General: Skin is warm and dry.  Neurological:     Mental Status: She is alert.  Psychiatric:        Behavior: Behavior normal.     Assessment/Plan: Right wrist fx -- Plan ORIF tonight at [facility] by Dr. Amedeo Plenty. Wrist splint for comfort in meantime. Right ankle fx -- CAM boot, WBAT. May need to see one of Dr. Vanetta Shawl partners for f/u. Multiple medical problems including HLD and depression    Lisette Abu, PA-C Orthopedic Surgery [phone] 12/25/2019, 11:28 AM

## 2019-12-25 NOTE — H&P (Signed)
Patient has been seen and examined at length.  She had a same level fall trying to get to work today.  She has comminuted complex volar Barton's intra-articular distal radius fracture we will plan for surgical management.  She has a right ankle injury with anterior talofibular ligament injury and evulsion of some of the fragments off of her talar neck region.  She denies other injury.  She has had coronavirus in September thus still test positive.  It is felt that this is not a situation where she needs isolation based upon the caretakers involved in her case.  I reviewed all issues with patient at length.  We will go ahead and move forward with surgical reconstruction of her wrist.  She does have sensation in her median and ulnar nerve but we will watch this very closely.  I discussed all issues risk and benefits and will move forward in an aggressive fashion to try to give her the best wrist possible.  She has an osteopenic look to her bones and certainly does not appear to be of the best bone quality based upon the radiographic views that I have reviewed today.  She understands this.  We are planning surgery for your upper extremity. The risk and benefits of surgery to include risk of bleeding, infection, anesthesia,  damage to normal structures and failure of the surgery to accomplish its intended goals of relieving symptoms and restoring function have been discussed in detail. With this in mind we plan to proceed. I have specifically discussed with the patient the pre-and postoperative regime and the dos and don'ts and risk and benefits in great detail. Risk and benefits of surgery also include risk of dystrophy(CRPS), chronic nerve pain, failure of the healing process to go onto completion and other inherent risks of surgery The relavent the pathophysiology of the disease/injury process, as well as the alternatives for treatment and postoperative course of action has been discussed in great  detail with the patient who desires to proceed.  We will do everything in our power to help you (the patient) restore function to the upper extremity. It is a pleasure to see this patient today. Kennadie Brenner MD

## 2019-12-25 NOTE — ED Provider Notes (Signed)
[place] DEPT Provider Note   CSN: [id number] Arrival date & time: 12/25/19  6440     History Chief Complaint  Patient presents with  . Ankle Pain  . Wrist Pain  . Fall    Anna Jackson is a 59 y.o. female.  HPI     59 year old female history of non-Hodgkin's lymphoma, presents today after fall.  She states she lost her balance going down steps.  There is no report of loss of consciousness.  She hurt her right wrist and her right ankle.  There is an obvious deformity to her right wrist.  She was able to walk on the ankle to get to the car into the ED.  She did not strike her head or lose consciousness.  She denies any neck or back pain.  She has some numbness in the fingers on her right hand but denies any other numbness, tingling, or weakness.  Past Medical History:  Diagnosis Date  . Adenopathy    left axillary  . Anemia   . Anxiety   . Anxiety state, unspecified   . Asthma    no problem with asthma in several years  . Barrett esophagus   . Depression   . Esophageal reflux   . Headache 05/16/2013  . Herpes simplex without mention of complication   . Hyperlipidemia 03/12/2014  . Hypertension   . 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 Mental disorder   . Neuropathic pain 04/04/2014  . Non Hodgkin's lymphoma ([place]) 2015  . Skin infection 11/02/2013  . Unspecified asthma(493.90)   . Unspecified essential hypertension   . URI (upper respiratory infection) 01/15/2014    Patient Active Problem List   Diagnosis Date Noted  . Angina at rest [facility]) 08/02/2019  . Hypokalemia 07/28/2019  . Chest pain, rule out acute myocardial infarction 07/27/2019  . Recurrent hiatal hernia s/p robotic PEH repair/Nissen 03/16/2017 03/16/2017  . Hiatal hernia   . Acute bronchitis 06/14/2016  . Vitamin B12 deficiency 10/10/2014  . Neuropathic pain 04/04/2014  . Hyperlipidemia 03/12/2014  . Obesity (BMI 30-39.9) 01/08/2014  . 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 Headache 05/16/2013  . History of lymphoma 03/20/2013  . Class 1 obesity due to excess calories in adult 08/08/2012  . Barrett's esophagus 08/10/2010  . Heme positive stool 06/30/2010  . Depression 03/04/2009  . GERD 12/08/2007  . Anemia in neoplastic disease 12/08/2007  . POSTMENOPAUSAL STATUS 11/21/2007  . Anxiety state 05/09/2007  . Pain in joint, lower leg 10/20/2006  . HERPES SIMPLEX, UNCOMPLICATED 34/74/2595  . ONYCHOMYCOSIS, TOENAILS 05/27/2006  . Essential hypertension 04/26/2006  . Asthma 04/26/2006  . HEADACHE 04/26/2006  . UMBILICAL HERNIORRHAPHY, HX OF 04/26/2006    Past Surgical History:  Procedure Laterality Date  . AXILLARY LYMPH NODE DISSECTION Left 03/09/2013   Procedure: EXCISION LEFT AXILLARY LYMPH NODES;  Surgeon: [name], MD;  Location: [facility] ORS;  Service: General;  Laterality: Left;  . BREAST BIOPSY Left 02/13/2013   high risk  . BREAST EXCISIONAL BIOPSY Left 03/09/2013   high risk  . CERVICAL CONIZATION W/BX    . CHOLECYSTECTOMY  1995   Open, done with Hiatal hernia repair  . ESOPHAGEAL MANOMETRY N/A 01/12/2017   Procedure: ESOPHAGEAL MANOMETRY (EM);  Surgeon: Doran Stabler, MD;  Location: [facility]OS[facility];  Service: Gastroenterology;  Laterality: N/A;  . INCISIONAL HERNIA REPAIR  2004   Dr Nedra Hai.  Parietex 20x15cm mesh  . KNEE SURGERY Right 02/2010  Dr Shellia Carwin, Arthroscopic  . NISSEN FUNDOPLICATION  2458   OPEN, Dr Lennie Hummer  . PORTACATH PLACEMENT N/A 03/23/2013   Procedure: INSERTION PORT-A-CATH;  Surgeon: [name], MD;  Location: [place];  Service: General;  Laterality: N/A;  . TUBAL LIGATION    . VAGINAL HYSTERECTOMY  2002   Dr Toni Amend Raphael Gibney     OB History   No obstetric history on file.     Family History  Problem Relation Age of Onset  . Heart disease Mother   . Coronary artery disease Other        with hernia repair  . Migraines Other   . Hypertension Other   . Breast  cancer Maternal Grandmother 62  . Cancer Maternal Grandmother 50       breast  . Heart disease Brother   . Alcoholism Brother   . Colon cancer Neg Hx     Social History   Tobacco Use  . Smoking status: Never Smoker  . Smokeless tobacco: Never Used  Vaping Use  . Vaping Use: Never used  Substance Use Topics  . Alcohol use: Yes    Comment: occasional  . Drug use: No    Home Medications Prior to Admission medications   Medication Sig Start Date End Date Taking? Authorizing Provider  ALPRAZolam (XANAX) 0.25 MG tablet Take 1 tablet (0.25 mg total) by mouth 2 (two) times daily as needed for anxiety. 12/03/19   Ann Held, DO  atorvastatin (LIPITOR) 20 MG tablet Take 1 tablet (20 mg total) by mouth daily. 09/04/19 10/04/19  Ann Held, DO  Cholecalciferol (VITAMIN D3 SUPER STRENGTH) 50 MCG (2000 UT) CAPS Take 2,000 Units by mouth daily.    [provider]  docusate sodium (COLACE) 100 MG capsule Take 200 mg by mouth in the morning.    [provider]  erythromycin ophthalmic ointment Place 1 application into both eyes 3 (three) times daily. 11/09/19   Ann Held, DO  FLUoxetine (PROZAC) 40 MG capsule Take 1 capsule (40 mg total) by mouth daily. 05/25/19   Ann Held, DO  hydrochlorothiazide (HYDRODIURIL) 25 MG tablet Take 1 tablet (25 mg total) by mouth daily. 04/27/19   Roma Schanz R, DO  NON FORMULARY Take 1 tablet by mouth See admin instructions. Nature Made Super C Immune Complex tablets- Take 1 tablet by mouth once a day    [provider]  potassium chloride SA (KLOR-CON) 20 MEQ tablet Take 1 tablet (20 mEq total) by mouth daily. 11/13/19   Ann Held, DO  valACYclovir (VALTREX) 1000 MG tablet Take 1 tablet (1,000 mg total) by mouth 3 (three) times daily. 01/28/17   Ann Held, DO  vitamin B-12 (CYANOCOBALAMIN) 1000 MCG tablet Take 1,000 mcg by mouth daily.    [provider]     Allergies    Betadine [povidone iodine] and Codeine  Review of Systems   Review of Systems  All other systems reviewed and are negative.   Physical Exam Updated Vital Signs BP 138/70 (BP Location: Left Arm)   Pulse (!) 55   Temp 97.8 F (36.6 C) (Oral)   Resp 18   Ht 1.727 m (5\' 8" )   Wt 95.7 kg   SpO2 99%   BMI 32.08 kg/m   Physical Exam Vitals and nursing note reviewed.  Constitutional:      Appearance: Normal appearance.  HENT:     Head: Normocephalic.  Right Ear: External ear normal.     Left Ear: External ear normal.     Nose: Nose normal.     Mouth/Throat:     Mouth: Mucous membranes are moist.     Pharynx: Oropharynx is clear.  Eyes:     Extraocular Movements: Extraocular movements intact.     Pupils: Pupils are equal, round, and reactive to light.  Cardiovascular:     Rate and Rhythm: Normal rate and regular rhythm.     Pulses: Normal pulses.  Pulmonary:     Effort: Pulmonary effort is normal.  Abdominal:     General: Abdomen is flat.     Palpations: Abdomen is soft.  Musculoskeletal:     Cervical back: Normal range of motion. No tenderness.     Comments: Right wrist deformity, closed Radial pulse intact Fingers pink with full active range of motion and sensation intact Right ankle with lateral swelling and tenderness over the lateral aspect.  Dorsal pedals pulses are intact and no sensory deficit noted  Skin:    General: Skin is warm and dry.     Capillary Refill: Capillary refill takes less than 2 seconds.  Neurological:     General: No focal deficit present.     Mental Status: She is alert and oriented to person, place, and time.  Psychiatric:        Mood and Affect: Mood normal.     ED Results / Procedures / Treatments   Labs (all labs ordered are listed, but only abnormal results are displayed) Labs Reviewed  RESPIRATORY PANEL BY RT PCR (FLU A&B, COVID)    EKG None  Radiology No results found.  Procedures Procedures  (including critical care time)  Medications Ordered in ED Medications  0.9 %  sodium chloride infusion (has no administration in time range)  fentaNYL (SUBLIMAZE) injection 50 mcg (has no administration in time range)    ED Course  I have reviewed the triage vital signs and the nursing notes.  Pertinent labs & imaging results that were available during my care of the patient were reviewed by me and considered in my medical decision making (see chart for details).  Clinical Course as of Dec 24 1008  Tue Dec 25, 2019  1000 X-rays reviewed Displace right wrist fx- ortho page Right ankle fx- will place cam walker    [DR]    Clinical Course User Index [DR] Pattricia Boss, MD   MDM Rules/Calculators/A&P                          10:23 AM Discussed with Jacqulynn Cadet and will see and evaluate patient Patient resting comfortably after second dose of pain meds Discussed radiographic findings and current plan with patient.  Patient seen and evaluated by Ortho.  They plan admission to [facility]. Final Clinical Impression(s) / ED Diagnoses Final diagnoses:  Fall, initial encounter  Closed fracture of right wrist, initial encounter  Closed fracture of right ankle, initial encounter    Rx / DC Orders ED Discharge Orders    None       Pattricia Boss, MD 12/25/19 1157

## 2019-12-25 NOTE — Anesthesia Procedure Notes (Signed)
Anesthesia Regional Block: Supraclavicular block   Pre-Anesthetic Checklist: ,, timeout performed, Correct Patient, Correct Site, Correct Laterality, Correct Procedure, Correct Position, site marked, Risks and benefits discussed,  Surgical consent,  Pre-op evaluation,  At surgeon's request and post-op pain management  Laterality: Right  Prep: chloraprep       Needles:  Injection technique: Single-shot  Needle Type: Echogenic Stimulator Needle     Needle Length: 9cm  Needle Gauge: 21     Additional Needles:   Procedures:,,,, ultrasound used (permanent image in chart),,,,  Narrative:  Start time: 12/25/2019 10:50 PM End time: 12/25/2019 10:55 PM Injection made incrementally with aspirations every 5 mL.  Performed by: Personally  Anesthesiologist: Effie Berkshire, MD  Additional Notes: Patient tolerated the procedure well. Local anesthetic introduced in an incremental fashion under minimal resistance after negative aspirations. No paresthesias were elicited. After completion of the procedure, no acute issues were identified and patient continued to be monitored by RN.

## 2019-12-25 NOTE — ED Notes (Signed)
Pt states she testes positive first week of september, 2021

## 2019-12-25 NOTE — ED Notes (Signed)
Spoke to [employer] who states Covid-19 results have been received and patient to be transported to [facility].

## 2019-12-25 NOTE — ED Notes (Signed)
Spoke with Dr Amedeo Plenty x 2 regarding transferring pt [facility] for hand surgery. Due to pt Covid + she will need isolation, spoke with Clarise Cruz RN CN [facility], due to holds no room available. Spoke with Marlowe Kays RN and Consulting civil engineer at [employer] who presently are trying to accommodate the request. Due to staffing and volume of pts they will contact OR to see if an scheduled time for surgery has been set, if so they will coordinate with [facility] CN on transfer for surgery. Heath Lark will keep Dr Amedeo Plenty in the loop through talking to [facility] staff.

## 2019-12-25 NOTE — ED Notes (Signed)
Pt updated on plan of care by [facility]oc[facility].

## 2019-12-25 NOTE — ED Notes (Signed)
John, ortho tech at bedside, placing splints

## 2019-12-25 NOTE — ED Notes (Signed)
[facility] at bedside, all belongings going with pt.

## 2019-12-25 NOTE — ED Triage Notes (Signed)
Pt brought for surgical services for right wrist and ankle fracture. Right upper and lower extremities have splints in place.

## 2019-12-25 NOTE — ED Notes (Signed)
Spoke with Moshe Salisbury with [facility] about covid status. Pt initially tested positive sept 8th

## 2019-12-25 NOTE — Anesthesia Preprocedure Evaluation (Addendum)
Anesthesia Evaluation  Patient identified by MRN, date of birth, ID band Patient awake    Reviewed: Allergy & Precautions, NPO status , Patient's Chart, lab work & pertinent test results  Airway Mallampati: II  TM Distance: >3 FB Neck ROM: Full    Dental  (+) Teeth Intact, Dental Advisory Given   Pulmonary asthma ,    breath sounds clear to auscultation       Cardiovascular hypertension, Pt. on medications  Rhythm:Regular Rate:Normal     Neuro/Psych  Headaches, PSYCHIATRIC DISORDERS Anxiety Depression    GI/Hepatic Neg liver ROS, hiatal hernia, GERD  ,  Endo/Other  negative endocrine ROS  Renal/GU negative Renal ROS     Musculoskeletal negative musculoskeletal ROS (+)   Abdominal Normal abdominal exam  (+)   Peds  Hematology   Anesthesia Other Findings   Reproductive/Obstetrics                             Anesthesia Physical Anesthesia Plan  ASA: II  Anesthesia Plan: Regional   Post-op Pain Management:    Induction: Intravenous  PONV Risk Score and Plan: 3 and Ondansetron, Dexamethasone and Midazolam  Airway Management Planned: Natural Airway and Simple Face Mask  Additional Equipment: None  Intra-op Plan:   Post-operative Plan:   Informed Consent: I have reviewed the patients History and Physical, chart, labs and discussed the procedure including the risks, benefits and alternatives for the proposed anesthesia with the patient or authorized representative who has indicated his/her understanding and acceptance.       Plan Discussed with: CRNA and Anesthesiologist  Anesthesia Plan Comments:         Anesthesia Quick Evaluation

## 2019-12-25 NOTE — ED Notes (Signed)
Dr. Veronia Beets advised to hold pain medication at this time.

## 2019-12-25 NOTE — ED Notes (Signed)
Report given to [facility] with [place]. Paperwork printed

## 2019-12-26 DIAGNOSIS — S52501A Unspecified fracture of the lower end of right radius, initial encounter for closed fracture: Secondary | ICD-10-CM | POA: Diagnosis present

## 2019-12-26 MED ORDER — ACETAMINOPHEN 500 MG PO TABS
1000.0000 mg | ORAL_TABLET | Freq: Four times a day (QID) | ORAL | Status: AC
  Administered 2019-12-26 (×3): 1000 mg via ORAL
  Filled 2019-12-26 (×4): qty 2

## 2019-12-26 MED ORDER — ACETAMINOPHEN 160 MG/5ML PO SOLN: 325.0000 mg | Freq: Once | ORAL | Status: DC | PRN

## 2019-12-26 MED ORDER — LACTATED RINGERS IV SOLN: INTRAVENOUS | Status: DC

## 2019-12-26 MED ORDER — ONDANSETRON HCL 4 MG PO TABS: 4.0000 mg | ORAL_TABLET | Freq: Four times a day (QID) | ORAL | Status: DC | PRN

## 2019-12-26 MED ORDER — HYDROMORPHONE HCL 1 MG/ML IJ SOLN
0.5000 mg | INTRAMUSCULAR | Status: DC | PRN
  Filled 2019-12-26: qty 1

## 2019-12-26 MED ORDER — ACETAMINOPHEN 10 MG/ML IV SOLN: 1000.0000 mg | Freq: Once | INTRAVENOUS | Status: DC | PRN

## 2019-12-26 MED ORDER — ONDANSETRON HCL 4 MG/2ML IJ SOLN: 4.0000 mg | Freq: Four times a day (QID) | INTRAMUSCULAR | Status: DC | PRN

## 2019-12-26 MED ORDER — AMISULPRIDE (ANTIEMETIC) 5 MG/2ML IV SOLN: 10.0000 mg | Freq: Once | INTRAVENOUS | Status: DC | PRN

## 2019-12-26 MED ORDER — METHOCARBAMOL 500 MG PO TABS
500.0000 mg | ORAL_TABLET | Freq: Four times a day (QID) | ORAL | Status: DC | PRN
  Administered 2019-12-26 – 2019-12-27 (×2): 500 mg via ORAL
  Filled 2019-12-26 (×2): qty 1

## 2019-12-26 MED ORDER — HYDROCHLOROTHIAZIDE 25 MG PO TABS
25.0000 mg | ORAL_TABLET | Freq: Every day | ORAL | Status: DC
  Administered 2019-12-26 – 2019-12-27 (×2): 25 mg via ORAL
  Filled 2019-12-26 (×2): qty 1

## 2019-12-26 MED ORDER — ATORVASTATIN CALCIUM 10 MG PO TABS
20.0000 mg | ORAL_TABLET | Freq: Every day | ORAL | Status: DC
  Administered 2019-12-26 – 2019-12-27 (×2): 20 mg via ORAL
  Filled 2019-12-26 (×2): qty 2

## 2019-12-26 MED ORDER — POTASSIUM CHLORIDE CRYS ER 20 MEQ PO TBCR
20.0000 meq | EXTENDED_RELEASE_TABLET | Freq: Every day | ORAL | Status: DC
  Administered 2019-12-26 – 2019-12-27 (×2): 20 meq via ORAL
  Filled 2019-12-26 (×2): qty 1

## 2019-12-26 MED ORDER — ACETAMINOPHEN 325 MG PO TABS: 325.0000 mg | ORAL_TABLET | Freq: Four times a day (QID) | ORAL | Status: DC | PRN

## 2019-12-26 MED ORDER — OXYCODONE HCL 5 MG PO TABS
5.0000 mg | ORAL_TABLET | ORAL | Status: DC | PRN
  Administered 2019-12-26 – 2019-12-27 (×2): 10 mg via ORAL
  Filled 2019-12-26 (×3): qty 2

## 2019-12-26 MED ORDER — 0.9 % SODIUM CHLORIDE (POUR BTL) OPTIME
TOPICAL | Status: DC | PRN
  Administered 2019-12-25: 1000 mL

## 2019-12-26 MED ORDER — OXYCODONE HCL 5 MG PO TABS
5.0000 mg | ORAL_TABLET | ORAL | 0 refills | Status: DC | PRN
Start: 2019-12-26 — End: 2020-11-25

## 2019-12-26 MED ORDER — FAMOTIDINE 20 MG PO TABS: 20.0000 mg | ORAL_TABLET | Freq: Two times a day (BID) | ORAL | Status: DC | PRN

## 2019-12-26 MED ORDER — METHOCARBAMOL 1000 MG/10ML IJ SOLN
500.0000 mg | Freq: Four times a day (QID) | INTRAVENOUS | Status: DC | PRN
  Filled 2019-12-26: qty 5

## 2019-12-26 MED ORDER — OXYCODONE HCL 5 MG PO TABS
10.0000 mg | ORAL_TABLET | ORAL | Status: DC | PRN
  Administered 2019-12-27 (×2): 15 mg via ORAL
  Filled 2019-12-26 (×2): qty 3

## 2019-12-26 MED ORDER — DOCUSATE SODIUM 100 MG PO CAPS
300.0000 mg | ORAL_CAPSULE | Freq: Every morning | ORAL | Status: DC
  Administered 2019-12-26 – 2019-12-27 (×2): 300 mg via ORAL
  Filled 2019-12-26 (×2): qty 3

## 2019-12-26 MED ORDER — MEPERIDINE HCL 25 MG/ML IJ SOLN: 6.2500 mg | INTRAMUSCULAR | Status: DC | PRN

## 2019-12-26 MED ORDER — ACETAMINOPHEN 325 MG PO TABS: 325.0000 mg | ORAL_TABLET | Freq: Once | ORAL | Status: DC | PRN

## 2019-12-26 MED ORDER — IBUPROFEN 200 MG PO TABS: 600.0000 mg | ORAL_TABLET | Freq: Four times a day (QID) | ORAL | Status: DC | PRN

## 2019-12-26 MED ORDER — ALPRAZOLAM 0.25 MG PO TABS: 0.2500 mg | ORAL_TABLET | Freq: Two times a day (BID) | ORAL | Status: DC | PRN

## 2019-12-26 MED ORDER — CEFAZOLIN SODIUM-DEXTROSE 1-4 GM/50ML-% IV SOLN
1.0000 g | Freq: Three times a day (TID) | INTRAVENOUS | Status: DC
  Administered 2019-12-26 – 2019-12-27 (×4): 1 g via INTRAVENOUS
  Filled 2019-12-26 (×4): qty 50

## 2019-12-26 MED ORDER — HYDROMORPHONE HCL 1 MG/ML IJ SOLN: 0.2500 mg | INTRAMUSCULAR | Status: DC | PRN

## 2019-12-26 MED ORDER — ASCORBIC ACID 500 MG PO TABS
1000.0000 mg | ORAL_TABLET | Freq: Every day | ORAL | Status: DC
  Administered 2019-12-26 – 2019-12-27 (×2): 1000 mg via ORAL
  Filled 2019-12-26 (×2): qty 2

## 2019-12-26 MED ORDER — FLUOXETINE HCL 20 MG PO CAPS
40.0000 mg | ORAL_CAPSULE | Freq: Every day | ORAL | Status: DC
  Administered 2019-12-26 – 2019-12-27 (×2): 40 mg via ORAL
  Filled 2019-12-26 (×2): qty 2

## 2019-12-26 MED ORDER — CEFAZOLIN SODIUM-DEXTROSE 1-4 GM/50ML-% IV SOLN: 1.0000 g | INTRAVENOUS | Status: DC

## 2019-12-26 MED ORDER — STERILE WATER FOR IRRIGATION IR SOLN
Status: DC | PRN
  Administered 2019-12-26: 100 mL

## 2019-12-26 MED ORDER — PROMETHAZINE HCL 12.5 MG RE SUPP: 12.5000 mg | Freq: Four times a day (QID) | RECTAL | Status: DC | PRN

## 2019-12-26 MED ORDER — ONDANSETRON HCL 4 MG PO TABS: 4.0000 mg | ORAL_TABLET | Freq: Four times a day (QID) | ORAL | 0 refills | Status: DC | PRN

## 2019-12-26 MED ORDER — METHOCARBAMOL 500 MG PO TABS: 500.0000 mg | ORAL_TABLET | Freq: Four times a day (QID) | ORAL | 0 refills | Status: DC | PRN

## 2019-12-26 NOTE — Evaluation (Signed)
Occupational Therapy Evaluation Patient Details Name: Anna Jackson MRN: [id number] DOB: [birth year] Today's Date: 12/26/2019    History of Present Illness Pt is a 59 y/o female who presented after mechanical fall at home. Pt sustained R comminuted complex volar Barton's intra-articular distal radius fracture (now s/p ORIF on 11/9), right ankle sprain with anterior talofibular ligament injury and evulsion of some of the fragments off of her talar neck region. PMHx includes anxiety, asthma, depression, HTN, Non Hodgkin's lymphoma, hx of R knee surgery (2012).    Clinical Impression   This 59 y/o female presents with the above. PTA pt reports being independent with ADL, iADL and functional mobility, was working. Pt currently presenting with the above and below listed deficits. Pt tolerating room level mobility (trialling both SPC and crutch with LUE), completing mobility tasks overall with MinA (+2 utilized for RUE support at this time). Pt with RUE deficits (dominant UE), is currently still feeling the nerve block post-op. Discussed general compensatory strategies for performing ADL and mobility tasks given RUE limitations with pt verbalizing understanding. Pt requiring up to [place] for ADL. Pt to benefit from continued acute OT services, recommend pt follow up with MD to initiate therapies for RUE once able after discharge. She plans to return home with spouse assist.   Of note received verbal clearance from Dr. Amedeo Plenty for ordering/using sling to support RUE during further mobility tasks - relayed to RN to order. OT to follow up for sling education as well.     Follow Up Recommendations  Follow surgeon's recommendation for DC plan and follow-up therapies;Supervision/Assistance - 24 hour (follow up for RUE when appropriate, 24hr initially )    Equipment Recommendations  None recommended by OT           Precautions / Restrictions Precautions Precautions: Fall Required Braces or Orthoses:  Other Brace;Splint/Cast Splint/Cast: soft cast to RUE Other Brace: air cast/boot to RLE Restrictions Weight Bearing Restrictions: Yes RUE Weight Bearing: Non weight bearing RLE Weight Bearing: Weight bearing as tolerated      Mobility Bed Mobility               General bed mobility comments: OOB in recliner upon arrival     Transfers Overall transfer level: Needs assistance Equipment used: Straight cane Transfers: Sit to/from Stand Sit to Stand: Min assist         General transfer comment: to rise and stabilize; support provided at [place] today given heaviness from cast and nerve block    Balance Overall balance assessment: Needs assistance Sitting-balance support: Feet supported Sitting balance-Leahy Scale: Good     Standing balance support: Single extremity supported Standing balance-Leahy Scale: Fair Standing balance comment: minA for dynamic mobility and UE support given RLE pain                           ADL either performed or assessed with clinical judgement   ADL Overall ADL's : Needs assistance/impaired Eating/Feeding: Set up;Sitting   Grooming: Set up;Supervision/safety;Sitting   Upper Body Bathing: Min guard;Sitting   Lower Body Bathing: Minimal assistance;Sit to/from stand   Upper Body Dressing : Minimal assistance;Sitting   Lower Body Dressing: Moderate assistance;Sit to/from stand   Toilet Transfer: Minimal assistance;+2 for safety/equipment;Ambulation Toilet Transfer Details (indicate cue type and reason): trialling both SPC and crutch with LUE. simulated via transfer to/from recliner; room level mobility  Toileting- Clothing Manipulation and Hygiene: Moderate assistance;Sit to/from stand  Functional mobility during ADLs: Minimal assistance;+2 for safety/equipment;Cane (cane vs crutch) General ADL Comments: messaged MD re: ordering sling for RUE support during mobility tasks; second person utilized for RUE support during  mobility tasks today. MD providing verbal clearance for sling                          Pertinent Vitals/Pain Pain Assessment: Faces Faces Pain Scale: Hurts even more Pain Location: RLE with wt bearing, RUE still with nerve block Pain Descriptors / Indicators: Grimacing;Discomfort;Sore Pain Intervention(s): Limited activity within patient's tolerance;Monitored during session;Repositioned     Hand Dominance Right   Extremity/Trunk Assessment Upper Extremity Assessment Upper Extremity Assessment: RUE deficits/detail RUE Deficits / Details: pt still feeling effects from nerve block, numb at digits and reports some tingling at proximal portion of UE; casted grossly from MCPs to upper forearm RUE: Unable to fully assess due to immobilization RUE Sensation: decreased light touch (due to nerve block) RUE Coordination: decreased fine motor;decreased gross motor   Lower Extremity Assessment Lower Extremity Assessment: Defer to PT evaluation   Cervical / Trunk Assessment Cervical / Trunk Assessment: Normal   Communication Communication Communication: No difficulties   Cognition Arousal/Alertness: Awake/alert Behavior During Therapy: WFL for tasks assessed/performed Overall Cognitive Status: Within Functional Limits for tasks assessed                                     General Comments       Exercises Exercises: Other exercises Other Exercises Other Exercises: educated in digit ROM (wihtin limits of cast) and elbow ROM; unable to fully practice today given pt still feeling nerveblock Other Exercises: educated in elevation of UE when at rest    Shoulder Instructions      Home Living Family/patient expects to be discharged to:: Private residence Living Arrangements: Spouse/significant other (76 y/o grandson w/ disabilities) Available Help at Discharge: Family;Available 24 hours/day Type of Home: House Home Access: Stairs to enter [employer] of  Steps: 3 Entrance Stairs-Rails: Right;Left;Can reach both Home Layout: One level     Bathroom Shower/Tub: Occupational psychologist: Standard     Home Equipment: Shower seat;Hand held shower head          Prior Functioning/Environment Level of Independence: Independent        Comments: works in [place] Problem List: Decreased strength;Decreased range of motion;Decreased activity tolerance;Impaired balance (sitting and/or standing);Decreased knowledge of use of DME or AE;Decreased knowledge of precautions;Impaired UE functional use;Pain      OT Treatment/Interventions: Self-care/ADL training;Therapeutic exercise;Energy conservation;DME and/or AE instruction;Therapeutic activities;Patient/family education;Balance training    OT Goals(Current goals can be found in the care plan section) Acute Rehab OT Goals Patient Stated Goal: home when able  OT Goal Formulation: With patient Time For Goal Achievement: 01/09/20 Potential to Achieve Goals: Good  OT Frequency: Min 2X/week   Barriers to D/C:            Co-evaluation PT/OT/SLP Co-Evaluation/Treatment: Yes Reason for Co-Treatment: For patient/therapist safety;To address functional/ADL transfers   OT goals addressed during session: ADL's and self-care;Strengthening/ROM      AM-PAC OT "6 Clicks" Daily Activity     Outcome Measure Help from another person eating meals?: A Little Help from another person taking care of personal grooming?: A Little Help from another person toileting, which includes using toliet,  bedpan, or urinal?: A Lot Help from another person bathing (including washing, rinsing, drying)?: A Lot Help from another person to put on and taking off regular upper body clothing?: A Little Help from another person to put on and taking off regular lower body clothing?: A Lot 6 Click Score: 15   End of Session Equipment Utilized During Treatment: Gait belt;Other (comment)  (cane/crutch) Nurse Communication: Mobility status;Other (comment) (MD cleared to order sling, asked RN to order)  Activity Tolerance: Patient tolerated treatment well Patient left: in chair;with call bell/phone within reach  OT Visit Diagnosis: Other abnormalities of gait and mobility (R26.89);Pain Pain - Right/Left: Right Pain - part of body: Ankle and joints of foot                Time: 7579-7282 OT Time Calculation (min): 30 min Charges:  OT General Charges $OT Visit: 1 Visit OT Evaluation $OT Eval Moderate Complexity: [place], OT Acute Rehabilitation Services Pager [phone] Office [place] 12/26/2019, 10:05 AM

## 2019-12-26 NOTE — TOC Initial Note (Signed)
Transition of Care [facility]) - Initial/Assessment Note    Patient Details  Name: Anna Jackson MRN: [id number] Date of Birth: 1960-09-19  Transition of Care [facility]) CM/SW Contact:    Curlene Labrum, RN Phone Number: 12/26/2019, 12:36 PM  Clinical Narrative:                 Case management met with the patient at the bedside regarding transitions of care to home after a fall and Right wrist fx and repair by Dr. Amedeo Plenty and Right ankle injury.  The patient lives with family and plans to continuing work at home.  The patient has a walking boot, Right arm sling, and crutches present in the room.  The patient plans to follow up with her PCP after discharge.  No other needs at this time.  Patient plans to discharge home with family today after seeing Dr. Amedeo Plenty.  Expected Discharge Plan: Home/Self Care Barriers to Discharge: No Barriers Identified   Patient Goals and CMS Choice Patient states their goals for this hospitalization and ongoing recovery are:: Patient plans to discharge home with family today - no needs. CMS Medicare.gov Compare Post Acute Care list provided to:: Patient Choice offered to / list presented to : Patient  Expected Discharge Plan and Services Expected Discharge Plan: Home/Self Care   Discharge Planning Services: CM Consult   Living arrangements for the past 2 months: Mobile Home                                      Prior Living Arrangements/Services Living arrangements for the past 2 months: Mobile Home Lives with:: Domestic Partner Patient language and need for interpreter reviewed:: Yes Do you feel safe going back to the place where you live?: Yes      Need for Family Participation in Patient Care: Yes (Comment) Care giver support system in place?: Yes (comment) Current home services: DME (has right arm sling, right CAM walker, crutches in room) Criminal Activity/Legal Involvement Pertinent to Current Situation/Hospitalization: No -  Comment as needed  Activities of Daily Living Home Assistive Devices/Equipment: Eyeglasses ADL Screening (condition at time of admission) Patient's cognitive ability adequate to safely complete daily activities?: Yes Is the patient deaf or have difficulty hearing?: No Does the patient have difficulty seeing, even when wearing glasses/contacts?: No Does the patient have difficulty concentrating, remembering, or making decisions?: No Patient able to express need for assistance with ADLs?: Yes Does the patient have difficulty dressing or bathing?: Yes Independently performs ADLs?: No Communication: Independent Dressing (OT): Needs assistance Is this a change from baseline?: Change from baseline, expected to last >3 days Grooming: Needs assistance Is this a change from baseline?: Change from baseline, expected to last >3 days Feeding: Needs assistance Is this a change from baseline?: Change from baseline, expected to last >3 days Bathing: Needs assistance Is this a change from baseline?: Change from baseline, expected to last >3 days Toileting: Needs assistance Is this a change from baseline?: Change from baseline, expected to last >3days In/Out Bed: Needs assistance Is this a change from baseline?: Change from baseline, expected to last >3 days Walks in Home: Needs assistance Is this a change from baseline?: Change from baseline, expected to last >3 days Does the patient have difficulty walking or climbing stairs?: Yes Weakness of Legs: Right Weakness of Arms/Hands: Right  Permission Sought/Granted Permission sought to share information with : Case Manager Permission  granted to share information with : Yes, Verbal Permission Granted        Permission granted to share info w Relationship: boyfriend - Cornelia Copa     Emotional Assessment Appearance:: Appears stated age Attitude/Demeanor/Rapport: Gracious Affect (typically observed): Accepting Orientation: : Oriented to Self, Oriented  to Place, Oriented to  Time, Oriented to Situation Alcohol / Substance Use: Not Applicable    Admission diagnosis:  Right wrist fracture [S62.101A] Closed fracture of right ankle, initial encounter [S82.891A] Fall, initial encounter [W19.XXXA] Closed fracture of right wrist, initial encounter [S62.101A] Closed fracture of distal end of right radius [S52.501A] Patient Active Problem List   Diagnosis Date Noted  . Closed fracture of distal end of right radius 12/26/2019  . Right wrist fracture 12/25/2019  . Angina at rest [facility]) 08/02/2019  . Hypokalemia 07/28/2019  . Chest pain, rule out acute myocardial infarction 07/27/2019  . Recurrent hiatal hernia s/p robotic PEH repair/Nissen 03/16/2017 03/16/2017  . Hiatal hernia   . Acute bronchitis 06/14/2016  . Vitamin B12 deficiency 10/10/2014  . Neuropathic pain 04/04/2014  . Hyperlipidemia 03/12/2014  . Obesity (BMI 30-39.9) 01/08/2014  . Neuropathy due to chemotherapeutic drug ([place]) 07/18/2013  . Headache 05/16/2013  . History of lymphoma 03/20/2013  . Class 1 obesity due to excess calories in adult 08/08/2012  . Barrett's esophagus 08/10/2010  . Heme positive stool 06/30/2010  . Depression 03/04/2009  . GERD 12/08/2007  . Anemia in neoplastic disease 12/08/2007  . POSTMENOPAUSAL STATUS 11/21/2007  . Anxiety state 05/09/2007  . Pain in joint, lower leg 10/20/2006  . HERPES SIMPLEX, UNCOMPLICATED 61/16/4353  . ONYCHOMYCOSIS, TOENAILS 05/27/2006  . Essential hypertension 04/26/2006  . Asthma 04/26/2006  . HEADACHE 04/26/2006  . UMBILICAL HERNIORRHAPHY, HX OF 04/26/2006   PCP:  Ann Held, DO Pharmacy:   [facility] [address] [place] [address] Phone: [phone] Fax: [phone]     Social Determinants of Health (SDOH) Interventions    Readmission Risk Interventions No flowsheet data found.

## 2019-12-26 NOTE — Plan of Care (Signed)

## 2019-12-26 NOTE — Progress Notes (Signed)
Physical Therapy Treatment Patient Details Name: Anna Jackson MRN: [id number] DOB: Apr 23, 1960 Today's Date: 12/26/2019    History of Present Illness Pt is a 59 y/o female who presented after mechanical fall at home. Pt sustained R comminuted complex volar Barton's intra-articular distal radius fracture (now s/p ORIF on 11/9), right ankle sprain with anterior talofibular ligament injury and evulsion of some of the fragments off of her talar neck region. PMHx includes anxiety, asthma, depression, HTN, Non Hodgkin's lymphoma, hx of R knee surgery (2012).    PT Comments    Pt seen for additional session for transfer and gait training with single crutch for LUE support to offload painful RLE. Pt moving well, although painful, with intermittent min guard for balance. Requires assist for lower body dressing, including donning/doffing boot, which she reports husband will be able to assist with since pt has limited use of R/dominant hand. Pt hopeful for d/c home soon, will have necessary assist from family.   Follow Up Recommendations  No PT follow up;Supervision for mobility/OOB     Equipment Recommendations  Crutches    Recommendations for Other Services       Precautions / Restrictions Precautions Precautions: Fall Required Braces or Orthoses: Other Brace;Splint/Cast Splint/Cast: soft cast to RUE Other Brace: air cast/boot to RLE Restrictions Weight Bearing Restrictions: Yes RUE Weight Bearing: Non weight bearing RLE Weight Bearing: Weight bearing as tolerated    Mobility  Bed Mobility Overal bed mobility: Needs Assistance Bed Mobility: Sit to Supine       Sit to supine: Independent   General bed mobility comments: Able to return to supine with bed flat; practiced from L-side of bed to simulate home set-up  Transfers Overall transfer level: Needs assistance Equipment used: Crutches Transfers: Sit to/from Stand Sit to Stand: Min guard         General transfer  comment: Able to stand from recliner and low toilet seight with LUE support to push into standing, then grabbing crutch for stability; min guard for balance  Ambulation/Gait Ambulation/Gait assistance: Min guard Gait Distance (Feet): 36 Feet Assistive device: Crutches Gait Pattern/deviations: Step-through pattern;Decreased stride length;Decreased weight shift to right;Antalgic Gait velocity: Decreased   General Gait Details: Slow, antalgic gait with single crutch for LUE support to offload painful RLE; stability and technique improving, intermittent min guard for balance   Stairs             Wheelchair Mobility    Modified Rankin (Stroke Patients Only)       Balance Overall balance assessment: Needs assistance Sitting-balance support: Feet supported Sitting balance-Leahy Scale: Good     Standing balance support: Single extremity supported;No upper extremity supported Standing balance-Leahy Scale: Fair Standing balance comment: can static stand without UE support; dynamic stability improved with LUE support to offlaod painful RLE                            Cognition Arousal/Alertness: Awake/alert Behavior During Therapy: WFL for tasks assessed/performed Overall Cognitive Status: Within Functional Limits for tasks assessed                                        Exercises Other Exercises Other Exercises: educated in digit ROM (wihtin limits of cast) and elbow ROM; unable to fully practice today given pt still feeling nerveblock Other Exercises: educated in elevation of UE when  at rest  Other Exercises: Med[employer] HEP handout provided and practiced - LAQ, seated march, SLR, quad set    General Comments General comments (skin integrity, edema, etc.): Educ re: technique to ascend/descend stairs; foam use for RUE elevation. Required assist to don R foot sock and boot; reports husband can assist with this      Pertinent Vitals/Pain Pain  Assessment: Faces Faces Pain Scale: Hurts little more Pain Location: RLE with WB; RUE feeling "heavy" Pain Descriptors / Indicators: Grimacing;Discomfort;Sore;Heaviness Pain Intervention(s): Monitored during session;Limited activity within patient's tolerance;Repositioned    Home Living Family/patient expects to be discharged to:: Private residence Living Arrangements: Spouse/significant other;Other relatives Available Help at Discharge: Family;Available 24 hours/day Type of Home: House Home Access: Stairs to enter Entrance Stairs-Rails: Right;Left;Can reach both Home Layout: One level Home Equipment: Shower seat;Hand held shower head Additional Comments: Lives with husband and 29 y.o. grandson (with disabilities)    Prior Function Level of Independence: Independent      Comments: works in [employer], drives   PT Goals (current goals can now be found in the care plan section) Acute Rehab PT Goals Patient Stated Goal: home when able  PT Goal Formulation: With patient Time For Goal Achievement: 01/09/20 Potential to Achieve Goals: Good Progress towards PT goals: Progressing toward goals    Frequency    Min 5X/week      PT Plan Current plan remains appropriate    Co-evaluation   Reason for Co-Treatment: For patient/therapist safety;To address functional/ADL transfers   OT goals addressed during session: ADL's and self-care;Strengthening/ROM      AM-PAC PT "6 Clicks" Mobility   Outcome Measure  Help needed turning from your back to your side while in a flat bed without using bedrails?: None Help needed moving from lying on your back to sitting on the side of a flat bed without using bedrails?: None Help needed moving to and from a bed to a chair (including a wheelchair)?: A Little Help needed standing up from a chair using your arms (e.g., wheelchair or bedside chair)?: A Little Help needed to walk in hospital room?: A Little Help needed climbing 3-5 steps with a  railing? : A Little 6 Click Score: 20    End of Session   Activity Tolerance: Patient tolerated treatment well Patient left: with call bell/phone within reach;with bed alarm set Nurse Communication: Mobility status PT Visit Diagnosis: Other abnormalities of gait and mobility (R26.89);Pain     Time: 0034-9179 PT Time Calculation (min) (ACUTE ONLY): 14 min  Charges:  $Therapeutic Activity: 8-22 mins                     Mabeline Caras, PT, DPT Acute Rehabilitation Services  Pager [phone] Office [place] 12/26/2019, 11:13 AM

## 2019-12-26 NOTE — Plan of Care (Signed)

## 2019-12-26 NOTE — Anesthesia Postprocedure Evaluation (Signed)
Anesthesia Post Note  Patient: Anna Jackson  Procedure(s) Performed: OPEN REDUCTION INTERNAL FIXATION (ORIF) WRIST FRACTURE (Right Wrist)     Patient location during evaluation: PACU Anesthesia Type: Regional Level of consciousness: awake and alert Pain management: pain level controlled Vital Signs Assessment: post-procedure vital signs reviewed and stable Respiratory status: spontaneous breathing, nonlabored ventilation, respiratory function stable and patient connected to nasal cannula oxygen Cardiovascular status: stable and blood pressure returned to baseline Postop Assessment: no apparent nausea or vomiting Anesthetic complications: no   No complications documented.  Last Vitals:  Vitals:   12/26/19 0030 12/26/19 0045  BP: 129/69 116/73  Pulse: 70 69  Resp: 18 19  Temp:  36.4 C  SpO2: 93% 92%    Last Pain:  Vitals:   12/26/19 0028  TempSrc:   PainSc: 0-No pain                 Effie Berkshire

## 2019-12-26 NOTE — Progress Notes (Signed)
Patient ID: Anna Jackson, female   DOB: 10-03-1960, 59 y.o.   MRN: [id number] Patient looks very well.  She is status post ORIF right distal radius fracture with closed treatment of anterior talofibular ligament injury to her right ankle.  Stable ligamentous examination about the left upper and lower extremity.  She is neurovascularly intact about the hand no signs of infection or dystrophy.  We will plan for discharge in the morning.  She looks quite well today.  Chest is clear abdomen is nontender she is awake alert and oriented and doing quite nicely.  All questions have been addressed.  Jasier Calabretta MD

## 2019-12-26 NOTE — Transfer of Care (Signed)
Immediate Anesthesia Transfer of Care Note  Patient: Anna Jackson  Procedure(s) Performed: OPEN REDUCTION INTERNAL FIXATION (ORIF) WRIST FRACTURE (Right Wrist)  Patient Location: PACU  Anesthesia Type:General  Level of Consciousness: awake, alert  and oriented  Airway & Oxygen Therapy: Patient Spontanous Breathing  Post-op Assessment: Report given to RN and Post -op Vital signs reviewed and stable  Post vital signs: Reviewed and stable  Last Vitals:  Vitals Value Taken Time  BP    Temp    Pulse    Resp    SpO2      Last Pain:  Vitals:   12/25/19 2218  TempSrc:   PainSc: 8          Complications: No complications documented.

## 2019-12-26 NOTE — Discharge Summary (Signed)
Physician Discharge Summary  Patient ID: Anna Jackson MRN: [id number] DOB/AGE: December 18, 1960 59 y.o.  Admit date: 12/25/2019 Discharge date:   Admission Diagnoses: Right distal radius fracture comminuted complex greater than 3 part intra-articular Past Medical History:  Diagnosis Date  . Adenopathy    left axillary  . Anemia   . Anxiety   . Anxiety state, unspecified   . Asthma    no problem with asthma in several years  . Barrett esophagus   . Depression   . Esophageal reflux   . Headache 05/16/2013  . Herpes simplex without mention of complication   . Hyperlipidemia 03/12/2014  . Hypertension   . 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 Mental disorder   . Neuropathic pain 04/04/2014  . Non Hodgkin's lymphoma ([place]) 2015  . Skin infection 11/02/2013  . Unspecified asthma(493.90)   . Unspecified essential hypertension   . URI (upper respiratory infection) 01/15/2014    Discharge Diagnoses:  Active Problems:   Right wrist fracture   Closed fracture of distal end of right radius   Surgeries: Procedure(s): #1 right wrist/radius open reduction internal fixation comminuted complex distal radius fracture with DVR [employer] cross lock plate and screw construct.  This was a greater than 3 part intra-articular fracture.  #2 AP lateral and oblique x-rays performed examined and interpreted by myself #3 evaluation anesthesia ulnar styloid fracture with subsequent closed treatment based upon stability pattern. on 12/25/2019 - 12/26/2019    Consultants:   Discharged Condition: Improved  Hospital Course: Anna Jackson is an 59 y.o. female who was admitted 12/25/2019 with a chief complaint of  Chief Complaint  Patient presents with  . Ankle Pain  . Wrist Pain  . Fall  , and found to have a diagnosis of Right distal radius fracture comminuted complex greater than 3 part intra-articular.  They were brought to the operating room on 12/25/2019 - 12/26/2019 and underwent Procedure(s): #1  right wrist/radius open reduction internal fixation comminuted complex distal radius fracture with DVR [employer] cross lock plate and screw construct.  This was a greater than 3 part intra-articular fracture.  #2 AP lateral and oblique x-rays performed examined and interpreted by myself #3 evaluation anesthesia ulnar styloid fracture with subsequent closed treatment based upon stability pattern..    They were given perioperative antibiotics:  Anti-infectives (From admission, onward)   Start     Dose/Rate Route Frequency Ordered Stop   12/26/19 0600  ceFAZolin (ANCEF) IVPB 1 g/50 mL premix        1 g 100 mL/hr over 30 Minutes Intravenous Every 8 hours 12/26/19 0106     12/26/19 0200  ceFAZolin (ANCEF) IVPB 1 g/50 mL premix  Status:  Discontinued        1 g 100 mL/hr over 30 Minutes Intravenous NOW 12/26/19 0106 12/26/19 0110   12/25/19 2100  ceFAZolin (ANCEF) IVPB 2g/100 mL premix  Status:  Discontinued        2 g 200 mL/hr over 30 Minutes Intravenous On call to O.R. 12/25/19 1451 12/26/19 0103    .  They were given sequential compression devices, early ambulation, and Other (comment) for DVT prophylaxis.  Recent vital signs:  Patient Vitals for the past 24 hrs:  BP Temp Temp src Pulse Resp SpO2  12/26/19 1458 (!) 106/59 98.4 F (36.9 C) Oral 68 17 97 %  12/26/19 0730 109/63 98.4 F (36.9 C) Oral 69 15 91 %  12/26/19 0400 118/61 99.8 F (37.7 C) Oral 61 16 99 %  12/26/19 0109 123/68 98.7 F (37.1 C) Oral 67 15 98 %  12/26/19 0045 116/73 97.6 F (36.4 C) -- 69 19 92 %  12/26/19 0030 129/69 -- -- 70 18 93 %  12/26/19 0028 (!) 142/74 (!) 96.3 F (35.7 C) -- 68 (!) 22 94 %  12/25/19 2215 127/69 -- -- 67 15 95 %  12/25/19 2053 125/74 98.2 F (36.8 C) Oral 66 12 98 %  12/25/19 2000 119/70 98.2 F (36.8 C) Oral 63 12 94 %  12/25/19 1945 124/67 -- -- 71 (!) 22 97 %  12/25/19 1930 114/65 -- -- 64 17 92 %  12/25/19 1915 113/69 -- -- 64 17 94 %  12/25/19 1900 130/79 -- -- 74 (!) 22 96 %   .  Recent laboratory studies: DG Wrist Complete Right  Result Date: 12/25/2019 CLINICAL DATA:  Fall. EXAM: RIGHT WRIST - COMPLETE 3+ VIEW COMPARISON:  None. FINDINGS: Comminuted fracture distal radius with 1 shaft of ventral displacement. Fracture extends into the wrist joint. Avulsion of the ulnar styloid. Carpal bones intact. IMPRESSION: Comminuted and displaced fracture distal radius. Fracture ulnar styloid. Electronically Signed   By: Franchot Gallo M.D.   On: 12/25/2019 09:27   DG Ankle Complete Right  Result Date: 12/25/2019 CLINICAL DATA:  [place] down steps EXAM: RIGHT ANKLE - COMPLETE 3+ VIEW COMPARISON:  None. FINDINGS: Small avulsion fracture of the dorsal talus appears acute. Nondisplaced fracture distal fibula. Ankle joint space normal. Mild calcaneal spurring. IMPRESSION: Acute avulsion fracture of the dorsal talus. Nondisplaced fracture distal fibula. Electronically Signed   By: Franchot Gallo M.D.   On: 12/25/2019 09:29   DG Chest Port 1 View  Result Date: 12/25/2019 CLINICAL DATA:  Preop. EXAM: PORTABLE CHEST 1 VIEW COMPARISON:  Chest radiographs 07/27/2019 and cardiac CT 07/28/2019 FINDINGS: The patient is slightly rotated to the right. The cardiomediastinal silhouette is within normal limits. No confluent airspace opacity, edema, sizable pleural effusion, or pneumothorax is identified. Surgical clips are noted in the left axilla and upper abdomen. No acute osseous abnormality is seen. IMPRESSION: No active disease. Electronically Signed   By: Logan Bores M.D.   On: 12/25/2019 10:02   DG Knee Complete 4 Views Right  Result Date: 12/25/2019 CLINICAL DATA:  [place] down stairs EXAM: RIGHT KNEE - COMPLETE 4+ VIEW COMPARISON:  None. FINDINGS: Negative for fracture or joint effusion Moderate joint space narrowing and spurring in the medial joint compartment. Mild lateral joint space spurring. IMPRESSION: Negative for fracture. Electronically Signed   By: Franchot Gallo M.D.   On: 12/25/2019  09:31   DG MINI C-ARM IMAGE ONLY  Result Date: 12/25/2019 There is no interpretation for this exam.  This order is for images obtained during a surgical procedure.  Please See "Surgeries" Tab for more information regarding the procedure.    Discharge Medications:   Allergies as of 12/26/2019      Reactions   Betadine [povidone Iodine] Hives, Itching   Codeine Nausea And Vomiting      Medication List    TAKE these medications   ALPRAZolam 0.25 MG tablet Commonly known as: XANAX Take 1 tablet (0.25 mg total) by mouth 2 (two) times daily as needed for anxiety.   atorvastatin 20 MG tablet Commonly known as: LIPITOR Take 1 tablet (20 mg total) by mouth daily.   docusate sodium 100 MG capsule Commonly known as: COLACE Take 300 mg by mouth in the morning.   erythromycin ophthalmic ointment Place 1 application into both eyes  3 (three) times daily.   FLUoxetine 40 MG capsule Commonly known as: PROZAC Take 1 capsule (40 mg total) by mouth daily.   hydrochlorothiazide 25 MG tablet Commonly known as: HYDRODIURIL Take 1 tablet (25 mg total) by mouth daily.   ibuprofen 200 MG tablet Commonly known as: ADVIL Take 600 mg by mouth every 6 (six) hours as needed for fever, headache or mild pain.   methocarbamol 500 MG tablet Commonly known as: ROBAXIN Take 1 tablet (500 mg total) by mouth every 6 (six) hours as needed for muscle spasms.   NON FORMULARY Take 1 tablet by mouth See admin instructions. Nature Made Super C Immune Complex tablets- Take 1 tablet by mouth once a day   ondansetron 4 MG tablet Commonly known as: ZOFRAN Take 1 tablet (4 mg total) by mouth every 6 (six) hours as needed for nausea.   oxyCODONE 5 MG immediate release tablet Commonly known as: Oxy IR/ROXICODONE Take 1-2 tablets (5-10 mg total) by mouth every 4 (four) hours as needed for moderate pain (pain score 4-6).   potassium chloride SA 20 MEQ tablet Commonly known as: KLOR-CON Take 1 tablet (20 mEq  total) by mouth daily.            Durable Medical Equipment  (From admission, onward)         Start     Ordered   12/26/19 1037  For home use only DME Crutches  Once        12/26/19 1036          Diagnostic Studies: DG Wrist Complete Right  Result Date: 12/25/2019 CLINICAL DATA:  Fall. EXAM: RIGHT WRIST - COMPLETE 3+ VIEW COMPARISON:  None. FINDINGS: Comminuted fracture distal radius with 1 shaft of ventral displacement. Fracture extends into the wrist joint. Avulsion of the ulnar styloid. Carpal bones intact. IMPRESSION: Comminuted and displaced fracture distal radius. Fracture ulnar styloid. Electronically Signed   By: Franchot Gallo M.D.   On: 12/25/2019 09:27   DG Ankle Complete Right  Result Date: 12/25/2019 CLINICAL DATA:  [place] down steps EXAM: RIGHT ANKLE - COMPLETE 3+ VIEW COMPARISON:  None. FINDINGS: Small avulsion fracture of the dorsal talus appears acute. Nondisplaced fracture distal fibula. Ankle joint space normal. Mild calcaneal spurring. IMPRESSION: Acute avulsion fracture of the dorsal talus. Nondisplaced fracture distal fibula. Electronically Signed   By: Franchot Gallo M.D.   On: 12/25/2019 09:29   DG Chest Port 1 View  Result Date: 12/25/2019 CLINICAL DATA:  Preop. EXAM: PORTABLE CHEST 1 VIEW COMPARISON:  Chest radiographs 07/27/2019 and cardiac CT 07/28/2019 FINDINGS: The patient is slightly rotated to the right. The cardiomediastinal silhouette is within normal limits. No confluent airspace opacity, edema, sizable pleural effusion, or pneumothorax is identified. Surgical clips are noted in the left axilla and upper abdomen. No acute osseous abnormality is seen. IMPRESSION: No active disease. Electronically Signed   By: Logan Bores M.D.   On: 12/25/2019 10:02   DG Knee Complete 4 Views Right  Result Date: 12/25/2019 CLINICAL DATA:  [place] down stairs EXAM: RIGHT KNEE - COMPLETE 4+ VIEW COMPARISON:  None. FINDINGS: Negative for fracture or joint effusion Moderate  joint space narrowing and spurring in the medial joint compartment. Mild lateral joint space spurring. IMPRESSION: Negative for fracture. Electronically Signed   By: Franchot Gallo M.D.   On: 12/25/2019 09:31   DG MINI C-ARM IMAGE ONLY  Result Date: 12/25/2019 There is no interpretation for this exam.  This order is for images obtained  during a surgical procedure.  Please See "Surgeries" Tab for more information regarding the procedure.    They benefited maximally from their hospital stay and there were no complications.     Disposition: Discharge disposition: 01-Home or Self Care      Discharge Instructions    Call MD / Call 911   Complete by: As directed    If you experience chest pain or shortness of breath, CALL 911 and be transported to the hospital emergency room.  If you develope a fever above 101 F, pus (white drainage) or increased drainage or redness at the wound, or calf pain, call your surgeon's office.   Constipation Prevention   Complete by: As directed    Drink plenty of fluids.  Prune juice may be helpful.  You may use a stool softener, such as Colace (over the counter) 100 mg twice a day.  Use MiraLax (over the counter) for constipation as needed.   Diet - low sodium heart healthy   Complete by: As directed    Increase activity slowly as tolerated   Complete by: As directed       Follow-up Information    Ann Held, DO. Schedule an appointment as soon as possible for a visit.   Specialty: Family Medicine Why: Please follow up with your primary care physician in the next 7-10 days after you are discharged from the hospital. Contact information: [address] [place] [address] [phone]        [name], MD Follow up in 14 day(s).   Specialty: Orthopedic Surgery Why: Please call the office to see Dr. Amedeo Plenty in 14 days for your follow-up visit Contact information: [address] [place]  [address] [phone]              Status post ORIF right comminuted complex intra-articular volar Barton's fracture about the radius.  RTC 14 days.  She also has a grade 2-3 anterior talofibular ligament injury to the ankle which we will treat conservatively.  I discussed this with her.  She will be weightbearing to tolerance with a 3D boot on her right lower extremity.  No weight to the right upper extremity.  No signs of DVT infection dystrophy or vascular compromise.  Should any problems occur we will be immediately available.  No signs of DVT.  No UTI.  She will be discharged home and follow-up in 14 days as outlined.  Signed: Satira Anis Smt Lokey III 12/26/2019, 6:52 PM

## 2019-12-26 NOTE — Op Note (Signed)
Operative note December 26, 2019  [name] MD  Preoperative diagnosis: Right distal radius fracture comminuted complex greater than 3 part intra-articular  Postop diagnosis: Same  Procedure: #1 right wrist/radius open reduction internal fixation comminuted complex distal radius fracture with DVR [employer] cross lock plate and screw construct.  This was a greater than 3 part intra-articular fracture.  #2 AP lateral and oblique x-rays performed examined and interpreted by myself #3 evaluation anesthesia ulnar styloid fracture with subsequent closed treatment based upon stability pattern.  [name] MD  Anesthesia: Block with IV sedation  Estimated blood loss minimal  Complications none immediate  Operative indications the patient presents for evaluation and surgical care.  Patient understands risk benefits and desires to proceed.  We have discussed with the patient all issues plans and concerns with this in mind we will proceed accordingly. We are planning surgery for your upper extremity. The risk and benefits of surgery to include risk of bleeding, infection, anesthesia,  damage to normal structures and failure of the surgery to accomplish its intended goals of relieving symptoms and restoring function have been discussed in detail. With this in mind we plan to proceed. I have specifically discussed with the patient the pre-and postoperative regime and the dos and don'ts and risk and benefits in great detail. Risk and benefits of surgery also include risk of dystrophy(CRPS), chronic nerve pain, failure of the healing process to go onto completion and other inherent risks of surgery The relavent the pathophysiology of the disease/injury process, as well as the alternatives for treatment and postoperative course of action has been discussed in great detail with the patient who desires to proceed.  We will do everything in our power to help you (the patient) restore function to the upper  extremity. It is a pleasure to see this patient today.    Operative procedure: Patient was seen by myself and anesthesia.  Appropriate anesthesia was induced and following this the patient was prepped with a Hibiclens pre-scrub followed by 10-minute surgical Betadine scrub and paint.  Once this was completed the extremity was elevated and the tourniquet was insufflated to 250 mmHg.  Timeout was observed preoperative antibiotics were given and the patient then underwent a very careful and cautious approach to the extremity with volar radial incision under 250 mm tourniquet control.  FCR tendon sheath was identified and dissected.  There were no complicating features.  Once this was completed the carpal canal contents were retracted ulnarly and the FCR was retracted radially.  We took very meticulous care of the radial artery and the carpal canal contents during the approach.  The pronator was accessed incised and lifted off of the fracture.  The fracture was then reassembled with standard orthopedic equipment and a DVR plate and screw construct from [employer] was accomplished in terms of placement and fixation of the fracture. The fracture was a highly comminuted volar Barton's fracture thus we did place a DVR plate with volar rim component.  I was very pleased with the reconstructive events.  This was an ORIF of the distal radius. Adequate radial height, volar tilt and radial inclination was restored.  The distal radial ulnar joint, radiocarpal and midcarpal joints all were  stable and satisfactory.  We irrigated copiously and closed the pronator with 3-0 Vicryl followed by closure of the skin edge with Prolene.  Once again, the distal radius underwent open reduction internal fixation without complications.  The distal radial ulnar joint was stable.  The patient had no complications.  All radiographic parameters look quite well following the fixation.  Standard dressing of Adaptic Xeroform 4 x 4's  gauze web roll Kerlix and a volar splint were applied.  The patient understands instructions of elevate move massage fingers notify us any problems occur and follow-up care according to our standard protocol for a DVR plate and screw construct.  He has been a pleasure participate in the patient's care and we look forward to spent in the patient's recovery.  [name] MD

## 2019-12-26 NOTE — Discharge Instructions (Signed)
Please remember to elevate move massage her fingers.  Please keep your bandage clean and dry.  Please call for any problems.  Please make sure to call the office so that Dr. Amedeo Plenty can see you in 14 days.  Please keep your bandage covered for showers.  Keep bandage clean and dry.  Call for any problems.  No smoking.  Criteria for driving a car: you should be off your pain medicine for 7-8 hours, able to drive one handed(confident), thinking clearly and feeling able in your judgement to drive. Continue elevation as it will decrease swelling.  If instructed by MD move your fingers within the confines of the bandage/splint.  Use ice if instructed by your MD. Call immediately for any sudden loss of feeling in your hand/arm or change in functional abilities of the extremity.We recommend that you to take vitamin C 1000 mg a day to promote healing. We also recommend that if you require  pain medicine that you take a stool softener to prevent constipation as most pain medicines will have constipation side effects. We recommend either Peri-Colace or Senokot and recommend that you also consider adding MiraLAX as well to prevent the constipation affects from pain medicine if you are required to use them. These medicines are over the counter and may be purchased at a local pharmacy. A cup of yogurt and a probiotic can also be helpful during the recovery process as the medicines can disrupt your intestinal environment.

## 2019-12-26 NOTE — Evaluation (Signed)
Physical Therapy Evaluation Patient Details Name: Anna Jackson MRN: [id number] DOB: Aug 01, 1960 Today's Date: 12/26/2019   History of Present Illness  Pt is a 59 y/o female who presented after mechanical fall at home. Pt sustained R comminuted complex volar Barton's intra-articular distal radius fracture (now s/p ORIF on 11/9), right ankle sprain with anterior talofibular ligament injury and evulsion of some of the fragments off of her talar neck region. PMHx includes anxiety, asthma, depression, HTN, Non Hodgkin's lymphoma, hx of R knee surgery (2012).     Clinical Impression  Pt presents with an overall decrease in functional mobility secondary to above. PTA, pt independent, works, and lives with family. Educ on precautions, positioning, therex (HEP handout provided). DVT prevention, and importance of mobility. Today, pt able to initiate transfer and gait training, trialled ambulation with SPC and single axillary crutch; stability ultimately improved with use of crutch to offload painful RLE. Pt would benefit from continued acute PT services to maximize functional mobility and independence prior to d/c home.     Follow Up Recommendations No PT follow up;Supervision for mobility/OOB (discussed potential for OP ortho PT in future for ankle stability)    Equipment Recommendations  Crutches    Recommendations for Other Services       Precautions / Restrictions Precautions Precautions: Fall Required Braces or Orthoses: Other Brace;Splint/Cast Splint/Cast: soft cast to RUE Other Brace: air cast/boot to RLE Restrictions Weight Bearing Restrictions: Yes RUE Weight Bearing: Non weight bearing RLE Weight Bearing: Weight bearing as tolerated      Mobility  Bed Mobility               General bed mobility comments: OOB in recliner upon arrival     Transfers Overall transfer level: Needs assistance Equipment used: None;Straight cane;Crutches Transfers: Sit to/from Stand Sit  to Stand: Min assist         General transfer comment: Initial minA to rise and stabilize without DME; additional trials with SPC and crutches  Ambulation/Gait Ambulation/Gait assistance: Min assist;Min guard Gait Distance (Feet): 60 Feet     Gait velocity: Decreased   General Gait Details: Initial ambulation without DME, pt reliant on minA for LUE HHA to maintain stability and offload painful RLE; additional gait trial with SPC, pt requiring intermittent minA for stability and cues for sequencing; final trial with single crutche under LUE, stability improved, min guard for balance  Stairs            Wheelchair Mobility    Modified Rankin (Stroke Patients Only)       Balance Overall balance assessment: Needs assistance Sitting-balance support: Feet supported Sitting balance-Leahy Scale: Good     Standing balance support: Single extremity supported;No upper extremity supported Standing balance-Leahy Scale: Fair Standing balance comment: can static stand without UE support; minA for dynamic mobility and UE support given RLE pain                             Pertinent Vitals/Pain Pain Assessment: Faces Faces Pain Scale: Hurts even more Pain Location: RLE with wt bearing; RUE still with nerve block Pain Descriptors / Indicators: Grimacing;Discomfort;Sore Pain Intervention(s): Monitored during session;Limited activity within patient's tolerance;Repositioned    Home Living Family/patient expects to be discharged to:: Private residence Living Arrangements: Spouse/significant other;Other relatives Available Help at Discharge: Family;Available 24 hours/day Type of Home: House Home Access: Stairs to enter Entrance Stairs-Rails: Right;Left;Can reach both Entrance Stairs-Number of Steps: 3 Home Layout:  One level Home Equipment: Shower seat;Hand held shower head Additional Comments: Lives with husband and 54 y.o. grandson (with disabilities)    Prior Function  Level of Independence: Independent         Comments: works in Press photographer, drives     Journalist, newspaper   Dominant Hand: Right    Extremity/Trunk Assessment   Upper Extremity Assessment Upper Extremity Assessment: RUE deficits/detail RUE Deficits / Details: pt still feeling effects from nerve block, numb at digits and reports some tingling at proximal portion of UE; casted grossly from MCPs to upper forearm; shoulder functionally >3/5 RUE: Unable to fully assess due to immobilization RUE Sensation: decreased light touch RUE Coordination: decreased fine motor;decreased gross motor    Lower Extremity Assessment Lower Extremity Assessment: RLE deficits/detail RLE Deficits / Details: s/p R ankle ligament injury; hip and knee functionally >3/5, able to wiggle toes and partially DF/PF ankle RLE Coordination: decreased fine motor    Cervical / Trunk Assessment Cervical / Trunk Assessment: Normal  Communication   Communication: No difficulties  Cognition Arousal/Alertness: Awake/alert Behavior During Therapy: WFL for tasks assessed/performed Overall Cognitive Status: Within Functional Limits for tasks assessed                                        General Comments General comments (skin integrity, edema, etc.): Axillary crutches ordered from [employer] (spoke with [place]). R ankle boot removed to check skin integrity (pt reports has not been off for >24 hrs); educ pt on removing at least 1x/day to check skin. Discussed potential for OP PT in future for ankle stability    Exercises Other Exercises Other Exercises: educated in digit ROM (wihtin limits of cast) and elbow ROM; unable to fully practice today given pt still feeling nerveblock Other Exercises: educated in elevation of UE when at rest  Other Exercises: [place] HEP handout provided and practiced - LAQ, seated march, SLR, quad set   Assessment/Plan    PT Assessment Patient needs continued PT services  PT  Problem List Decreased activity tolerance;Decreased balance;Decreased mobility;Decreased knowledge of use of DME;Pain       PT Treatment Interventions DME instruction;Gait training;Stair training;Functional mobility training;Therapeutic activities;Therapeutic exercise;Balance training;Patient/family education    PT Goals (Current goals can be found in the Care Plan section)  Acute Rehab PT Goals Patient Stated Goal: home when able  PT Goal Formulation: With patient Time For Goal Achievement: 01/09/20 Potential to Achieve Goals: Good    Frequency Min 5X/week   Barriers to discharge        Co-evaluation   Reason for Co-Treatment: For patient/therapist safety;To address functional/ADL transfers   OT goals addressed during session: ADL's and self-care;Strengthening/ROM       AM-PAC PT "6 Clicks" Mobility  Outcome Measure Help needed turning from your back to your side while in a flat bed without using bedrails?: None Help needed moving from lying on your back to sitting on the side of a flat bed without using bedrails?: None Help needed moving to and from a bed to a chair (including a wheelchair)?: A Little Help needed standing up from a chair using your arms (e.g., wheelchair or bedside chair)?: A Little Help needed to walk in hospital room?: A Little Help needed climbing 3-5 steps with a railing? : A Little 6 Click Score: 20    End of Session   Activity Tolerance: Patient tolerated treatment well  Patient left: in chair;with call bell/phone within reach;with chair alarm set Nurse Communication: Mobility status PT Visit Diagnosis: Other abnormalities of gait and mobility (R26.89);Pain    Time: 0221-7981 PT Time Calculation (min) (ACUTE ONLY): 30 min   Charges:   PT Evaluation $PT Eval Low Complexity: [place], PT, DPT Acute Rehabilitation Services  Pager [phone] Office [place] 12/26/2019, 11:02 AM

## 2019-12-26 NOTE — Progress Notes (Signed)
Occupational Therapy Treatment Patient Details Name: Anna Jackson MRN: [id number] DOB: 06/13/1960 Today's Date: 12/26/2019    History of present illness Pt is a 59 y/o female who presented after mechanical fall at home. Pt sustained R comminuted complex volar Barton's intra-articular distal radius fracture (now s/p ORIF on 11/9), right ankle sprain with anterior talofibular ligament injury and evulsion of some of the fragments off of her talar neck region. PMHx includes anxiety, asthma, depression, HTN, Non Hodgkin's lymphoma, hx of R knee surgery (2012).    OT comments  Pt seen for second session to address sling management/education for RUE. Educated in techniques for doffing/donning sling with pt return demonstrating/verbalizing understanding. Also reinforced using sling only for mobility purposes, to continue elevation when at rest. Pt will be returning home with spouse who can also assist. Pt completing room level mobility using L crutch, toileting and standing ADL with minguard-minA throughout. Feel POC remains appropriate at this time. Will continue to follow acutely.    Follow Up Recommendations  Follow surgeon's recommendation for DC plan and follow-up therapies;Supervision/Assistance - 24 hour (follow up for RUE when cleared to do so; 24hr initially )    Equipment Recommendations  None recommended by OT          Precautions / Restrictions Precautions Precautions: Fall Required Braces or Orthoses: Other Brace;Splint/Cast;Sling Splint/Cast: soft cast to RUE Other Brace: air cast/boot to RLE; now with sling for RUE support when OOB Restrictions Weight Bearing Restrictions: Yes RUE Weight Bearing: Non weight bearing RLE Weight Bearing: Weight bearing as tolerated       Mobility Bed Mobility Overal bed mobility: Needs Assistance Bed Mobility: Sit to Supine;Supine to Sit     Supine to sit: Supervision Sit to supine: Supervision   General bed mobility comments: HOB  elevated, distant supervision for safety however no assist required   Transfers Overall transfer level: Needs assistance Equipment used: Crutches Transfers: Sit to/from Stand Sit to Stand: Min guard         General transfer comment: standing from EOB and toilet     Balance Overall balance assessment: Needs assistance Sitting-balance support: Feet supported Sitting balance-Leahy Scale: Good     Standing balance support: Single extremity supported;No upper extremity supported Standing balance-Leahy Scale: Fair Standing balance comment: can static stand without UE support; dynamic stability improved with LUE support to offlaod painful RLE                           ADL either performed or assessed with clinical judgement   ADL Overall ADL's : Needs assistance/impaired     Grooming: Minimal assistance;Standing;Wash/dry hands Grooming Details (indicate cue type and reason): assist only to aide in drying LUE; educated in using RUE to assist with this once nerve block has worn off more; minguard for standing balance at sink during task completion          Upper Body Dressing : Minimal assistance;Sitting Upper Body Dressing Details (indicate cue type and reason): for sling management; educated in donning/doffing - pt able to bring over head for completion of task given good shoulder mobility      Toilet Transfer: Min guard;Ambulation;Regular Toilet (crutch) Toilet Transfer Details (indicate cue type and reason): close minguard for balance  Toileting- Clothing Manipulation and Hygiene: Min guard;Sitting/lateral lean;Sit to/from stand Toileting - Clothing Manipulation Details (indicate cue type and reason): pt performing pericare via lateral leans     Functional mobility during ADLs: Min guard (  crutch) General ADL Comments: pt seen for sling education, assisted with OOB toileting ADL during session; educated in removal of sling and elevation of RUE when at rest, sling  for support during mobility                        Cognition Arousal/Alertness: Awake/alert Behavior During Therapy: WFL for tasks assessed/performed Overall Cognitive Status: Within Functional Limits for tasks assessed                                          Exercises Other Exercises Other Exercises: encouraged neck/cerivcal ROM in addition to RUE HEP which was reviewed in previous session  Other Exercises: [employer] HEP handout provided and practiced - LAQ, seated march, SLR, quad set   Shoulder Instructions       General Comments Educ re: technique to ascend/descend stairs; foam use for RUE elevation. Required assist to don R foot sock and boot; reports husband can assist with this    Pertinent Vitals/ Pain       Pain Assessment: Faces Faces Pain Scale: Hurts little more Pain Location: RLE with WB; RUE feeling "heavy" Pain Descriptors / Indicators: Grimacing;Discomfort;Sore;Heaviness Pain Intervention(s): Limited activity within patient's tolerance;Monitored during session;Repositioned  Home Living Family/patient expects to be discharged to:: Private residence Living Arrangements: Spouse/significant other;Other relatives Available Help at Discharge: Family;Available 24 hours/day Type of Home: House Home Access: Stairs to enter [employer] of Steps: 3 Entrance Stairs-Rails: Right;Left;Can reach both Home Layout: One level     Bathroom Shower/Tub: Occupational psychologist: Standard     Home Equipment: Shower seat;Hand held shower head   Additional Comments: Lives with husband and 26 y.o. grandson (with disabilities)      Prior Functioning/Environment Level of Independence: Independent        Comments: works in Press photographer, [employer] 2X/week        Progress Toward Goals  OT Goals(current goals can now be found in the care plan section)  Progress towards OT goals: Progressing toward goals  Acute  Rehab OT Goals Patient Stated Goal: home when able  OT Goal Formulation: With patient Time For Goal Achievement: 01/09/20 Potential to Achieve Goals: Good ADL Goals Pt Will Perform Grooming: with supervision;standing Pt Will Perform Lower Body Bathing: with supervision;sit to/from stand Pt Will Perform Upper Body Dressing: with supervision;sitting Pt Will Perform Lower Body Dressing: with supervision;sit to/from stand Pt Will Transfer to Toilet: with supervision;ambulating Pt Will Perform Toileting - Clothing Manipulation and hygiene: with supervision;sit to/from stand Pt/caregiver will Perform Home Exercise Program: Right Upper extremity;With written HEP provided;With Supervision  Plan Discharge plan remains appropriate    Co-evaluation                 AM-PAC OT "6 Clicks" Daily Activity     Outcome Measure   Help from another person eating meals?: A Little Help from another person taking care of personal grooming?: A Little Help from another person toileting, which includes using toliet, bedpan, or urinal?: A Little Help from another person bathing (including washing, rinsing, drying)?: A Lot Help from another person to put on and taking off regular upper body clothing?: A Little Help from another person to put on and taking off regular lower body clothing?: A Lot 6 Click Score: 16    End of Session Equipment  Utilized During Treatment: Gait belt;Other (comment) (crutch, sling )  OT Visit Diagnosis: Other abnormalities of gait and mobility (R26.89);Pain Pain - Right/Left: Right Pain - part of body: Ankle and joints of foot   Activity Tolerance Patient tolerated treatment well   Patient Left in bed;with call bell/phone within reach;with bed alarm set   Nurse Communication Mobility status        Time: 2518-9842 OT Time Calculation (min): 18 min  Charges: OT General Charges $OT Visit: 1 Visit OT Treatments $Self Care/Home Management : 8-22 mins  Lou Cal, OT Acute Rehabilitation Services Pager [phone] Office [place] 12/26/2019, 2:06 PM

## 2019-12-27 ENCOUNTER — Encounter (HOSPITAL_COMMUNITY): Payer: Self-pay | Admitting: Orthopedic Surgery

## 2019-12-27 NOTE — Progress Notes (Signed)
Just an FYI, patient Covid-19 is showing positive on 12/25/2019 however she had both vaccines administered therefore she does not have to be on contact precautions.

## 2019-12-27 NOTE — Progress Notes (Signed)
AVS discussed with patient, and pt have no question. Will discharge patient now

## 2019-12-31 ENCOUNTER — Other Ambulatory Visit: Payer: Self-pay

## 2019-12-31 ENCOUNTER — Ambulatory Visit (INDEPENDENT_AMBULATORY_CARE_PROVIDER_SITE_OTHER): Payer: Managed Care, Other (non HMO) | Admitting: Family Medicine

## 2019-12-31 ENCOUNTER — Encounter: Payer: Self-pay | Admitting: Family Medicine

## 2019-12-31 ENCOUNTER — Ambulatory Visit (HOSPITAL_BASED_OUTPATIENT_CLINIC_OR_DEPARTMENT_OTHER)
Admission: RE | Admit: 2019-12-31 | Discharge: 2019-12-31 | Disposition: A | Discharge status: Home / Self Care | Payer: Managed Care, Other (non HMO) | Source: Ambulatory Visit | Attending: Family Medicine | Admitting: Family Medicine

## 2019-12-31 VITALS — BP 100/70 | HR 63 | Temp 98.1°F | Resp 18 | Ht 68.0 in | Wt 214.8 lb

## 2019-12-31 DIAGNOSIS — S82831A Other fracture of upper and lower end of right fibula, initial encounter for closed fracture: Secondary | ICD-10-CM | POA: Diagnosis not present

## 2019-12-31 DIAGNOSIS — E2839 Other primary ovarian failure: Secondary | ICD-10-CM | POA: Diagnosis not present

## 2019-12-31 DIAGNOSIS — S52501A Unspecified fracture of the lower end of right radius, initial encounter for closed fracture: Secondary | ICD-10-CM

## 2019-12-31 LAB — COMPREHENSIVE METABOLIC PANEL
ALT: 10 U/L (ref 0–35)
AST: 15 U/L (ref 0–37)
Albumin: 3.9 g/dL (ref 3.5–5.2)
Alkaline Phosphatase: 108 U/L (ref 39–117)
BUN: 12 mg/dL (ref 6–23)
CO2: 36 mEq/L — ABNORMAL HIGH (ref 19–32)
Calcium: 9.3 mg/dL (ref 8.4–10.5)
Chloride: 98 mEq/L (ref 96–112)
Creatinine, Ser: 0.67 mg/dL (ref 0.40–1.20)
GFR: 97.22 mL/min (ref >60.00)
Glucose, Bld: 95 mg/dL (ref 70–99)
Potassium: 3.6 mEq/L (ref 3.5–5.1)
Sodium: 141 mEq/L (ref 135–145)
Total Bilirubin: 1.1 mg/dL (ref 0.2–1.2)
Total Protein: 6.4 g/dL (ref 6.0–8.3)

## 2019-12-31 LAB — VITAMIN D 25 HYDROXY (VIT D DEFICIENCY, FRACTURES): VITD: 22 ng/mL — ABNORMAL LOW (ref 30.00–100.00)

## 2019-12-31 NOTE — Patient Instructions (Signed)
Osteoporosis  Osteoporosis is thinning and loss of density in your bones. Osteoporosis makes bones more brittle and fragile and more likely to break (fracture). Over time, osteoporosis can cause your bones to become so weak that they fracture after a minor fall. Bones in the hip, wrist, and spine are most likely to fracture due to osteoporosis. What are the causes? The exact cause of this condition is not known. What increases the risk? You may be at greater risk for osteoporosis if you:  Have a family history of the condition.  Have poor nutrition.  Use steroid medicines, such as prednisone.  Are female.  Are age 57 or older.  Smoke or have a history of smoking.  Are not physically active (are sedentary).  Are white (Caucasian) or of Asian descent.  Have a small body frame.  Take certain medicines, such as antiseizure medicines. What are the signs or symptoms? A fracture might be the first sign of osteoporosis, especially if the fracture results from a fall or injury that usually would not cause a bone to break. Other signs and symptoms include:  Pain in the neck or low back.  Stooped posture.  Loss of height. How is this diagnosed? This condition may be diagnosed based on:  Your medical history.  A physical exam.  A bone mineral density test, also called a DXA or DEXA test (dual-energy X-ray absorptiometry test). This test uses X-rays to measure the amount of minerals in your bones. How is this treated? The goal of treatment is to strengthen your bones and lower your risk for a fracture. Treatment may involve:  Making lifestyle changes, such as: ? Including foods with more calcium and vitamin D in your diet. ? Doing weight-bearing and muscle-strengthening exercises. ? Stopping tobacco use. ? Limiting alcohol intake.  Taking medicine to slow the process of bone loss or to increase bone density.  Taking daily supplements of calcium and vitamin D.  Taking  hormone replacement medicines, such as estrogen for women and testosterone for men.  Monitoring your levels of calcium and vitamin D. Follow these instructions at home:  Activity  Exercise as told by your health care provider. Ask your health care provider what exercises and activities are safe for you. You should do: ? Exercises that make you work against gravity (weight-bearing exercises), such as tai chi, yoga, or walking. ? Exercises to strengthen muscles, such as lifting weights. Lifestyle  Limit alcohol intake to no more than 1 drink a day for nonpregnant women and 2 drinks a day for men. One drink equals 12 oz of beer, 5 oz of wine, or 1 oz of hard liquor.  Do not use any products that contain nicotine or tobacco, such as cigarettes and e-cigarettes. If you need help quitting, ask your health care provider. Preventing falls  Use devices to help you move around (mobility aids) as needed, such as canes, walkers, scooters, or crutches.  Keep rooms well-lit and clutter-free.  Remove tripping hazards from walkways, including cords and throw rugs.  Install grab bars in bathrooms and safety rails on stairs.  Use rubber mats in the bathroom and other areas that are often wet or slippery.  Wear closed-toe shoes that fit well and support your feet. Wear shoes that have rubber soles or low heels.  Review your medicines with your health care provider. Some medicines can cause dizziness or changes in blood pressure, which can increase your risk of falling. General instructions  Include calcium and vitamin D in  your diet. Calcium is important for bone health, and vitamin D helps your body to absorb calcium. Good sources of calcium and vitamin D include: ? Certain fatty fish, such as salmon and tuna. ? Products that have calcium and vitamin D added to them (fortified products), such as fortified cereals. ? Egg yolks. ? Cheese. ? Liver.  Take over-the-counter and prescription medicines  only as told by your health care provider.  Keep all follow-up visits as told by your health care provider. This is important. Contact a health care provider if:  You have never been screened for osteoporosis and you are: ? A woman who is age 57 or older. ? A man who is age 20 or older. Get help right away if:  You fall or injure yourself. Summary  Osteoporosis is thinning and loss of density in your bones. This makes bones more brittle and fragile and more likely to break (fracture),even with minor falls.  The goal of treatment is to strengthen your bones and reduce your risk for a fracture.  Include calcium and vitamin D in your diet. Calcium is important for bone health, and vitamin D helps your body to absorb calcium.  Talk with your health care provider about screening for osteoporosis if you are a woman who is age 57 or older, or a man who is age 57 or older. This information is not intended to replace advice given to you by your health care provider. Make sure you discuss any questions you have with your health care provider. Document Revised: 01/14/2017 Document Reviewed: 11/26/2016 Elsevier Patient Education  2020 [employer].

## 2019-12-31 NOTE — Progress Notes (Signed)
Patient ID: Anna Jackson, female    DOB: 29-Jun-1960  Age: 59 y.o. MRN: [id number]    Subjective:  Subjective  HPI Anna Jackson presents for f/u hosp from r radial fracture and r distal fibula and talus --- surgery was done by Dr Amedeo Plenty Last Tuesday ---she was told by surgeon that she has osteoporosis  Review of Systems  Constitutional: Negative for appetite change, diaphoresis, fatigue and unexpected weight change.  Eyes: Negative for pain, redness and visual disturbance.  Respiratory: Negative for cough, chest tightness, shortness of breath and wheezing.   Cardiovascular: Negative for chest pain, palpitations and leg swelling.  Endocrine: Negative for cold intolerance, heat intolerance, polydipsia, polyphagia and polyuria.  Genitourinary: Negative for difficulty urinating, dysuria and frequency.  Musculoskeletal: Positive for gait problem and joint swelling.  Neurological: Negative for dizziness, light-headedness, numbness and headaches.    History Past Medical History:  Diagnosis Date  . Adenopathy    left axillary  . Anemia   . Anxiety   . Anxiety state, unspecified   . Asthma    no problem with asthma in several years  . Barrett esophagus   . Depression   . Esophageal reflux   . Headache 05/16/2013  . Herpes simplex without mention of complication   . Hyperlipidemia 03/12/2014  . Hypertension   . 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 Mental disorder   . Neuropathic pain 04/04/2014  . Non Hodgkin's lymphoma ([place]) 2015  . Skin infection 11/02/2013  . Unspecified asthma(493.90)   . Unspecified essential hypertension   . URI (upper respiratory infection) 01/15/2014    She has a past surgical history that includes Cholecystectomy (1995); Nissen fundoplication (6578); Cervical conization w/bx; Tubal ligation; Knee surgery (Right, 02/2010); Axillary lymph node dissection (Left, 03/09/2013); Portacath placement (N/A, 03/23/2013); Breast biopsy (Left, 02/13/2013); Breast  excisional biopsy (Left, 03/09/2013); Esophageal manometry (N/A, 01/12/2017); Incisional hernia repair (2004); Vaginal hysterectomy (2002); and ORIF wrist fracture (Right, 12/25/2019).   Her family history includes Alcoholism in her brother; Breast cancer (age of onset: 75) in her maternal grandmother; Cancer (age of onset: 41) in her maternal grandmother; Coronary artery disease in an other family member; Heart disease in her brother and mother; Hypertension in an other family member; Migraines in an other family member.She reports that she has never smoked. She has never used smokeless tobacco. She reports current alcohol use. She reports that she does not use drugs.  Current Outpatient Medications on File Prior to Visit  Medication Sig Dispense Refill  . ALPRAZolam (XANAX) 0.25 MG tablet Take 1 tablet (0.25 mg total) by mouth 2 (two) times daily as needed for anxiety. 20 tablet 0  . docusate sodium (COLACE) 100 MG capsule Take 300 mg by mouth in the morning.     Marland Kitchen FLUoxetine (PROZAC) 40 MG capsule Take 1 capsule (40 mg total) by mouth daily. 90 capsule 3  . hydrochlorothiazide (HYDRODIURIL) 25 MG tablet Take 1 tablet (25 mg total) by mouth daily. 90 tablet 3  . ibuprofen (ADVIL) 200 MG tablet Take 600 mg by mouth every 6 (six) hours as needed for fever, headache or mild pain.    . methocarbamol (ROBAXIN) 500 MG tablet Take 1 tablet (500 mg total) by mouth every 6 (six) hours as needed for muscle spasms. 40 tablet 0  . NON FORMULARY Take 1 tablet by mouth See admin instructions. Nature Made Super C Immune Complex tablets- Take 1 tablet by mouth once a day    . ondansetron (ZOFRAN) 4  MG tablet Take 1 tablet (4 mg total) by mouth every 6 (six) hours as needed for nausea. 20 tablet 0  . oxyCODONE (OXY IR/ROXICODONE) 5 MG immediate release tablet Take 1-2 tablets (5-10 mg total) by mouth every 4 (four) hours as needed for moderate pain (pain score 4-6). 40 tablet 0  . potassium chloride SA (KLOR-CON) 20  MEQ tablet Take 1 tablet (20 mEq total) by mouth daily. 60 tablet 2  . atorvastatin (LIPITOR) 20 MG tablet Take 1 tablet (20 mg total) by mouth daily. 90 tablet 1  . erythromycin ophthalmic ointment Place 1 application into both eyes 3 (three) times daily. (Patient not taking: Reported on 12/25/2019) 3.5 g 0   No current facility-administered medications on file prior to visit.     Objective:  Objective  Physical Exam Vitals and nursing note reviewed.  Constitutional:      Appearance: She is well-developed.  HENT:     Head: Normocephalic and atraumatic.  Eyes:     Conjunctiva/sclera: Conjunctivae normal.  Neck:     Thyroid: No thyromegaly.     Vascular: No carotid bruit or JVD.  Cardiovascular:     Rate and Rhythm: Normal rate and regular rhythm.     Heart sounds: Normal heart sounds. No murmur heard.   Pulmonary:     Effort: Pulmonary effort is normal. No respiratory distress.     Breath sounds: Normal breath sounds. No wheezing or rales.  Chest:     Chest wall: No tenderness.  Musculoskeletal:        General: Tenderness present.     Cervical back: Normal range of motion and neck supple.     Comments: Cast r wrist/ arm Boot on r foot   Neurological:     Mental Status: She is alert and oriented to person, place, and time.    BP 100/70 (BP Location: Right Arm, Patient Position: Sitting, Cuff Size: Large)   Pulse 63   Temp 98.1 F (36.7 C) (Oral)   Resp 18   Ht 5\' 8"  (1.727 m)   Wt 214 lb 12.8 oz (97.4 kg)   SpO2 97%   BMI 32.66 kg/m  Wt Readings from Last 3 Encounters:  12/31/19 214 lb 12.8 oz (97.4 kg)  12/25/19 211 lb (95.7 kg)  11/22/19 213 lb 6.4 oz (96.8 kg)     Lab Results  Component Value Date   WBC 8.1 12/25/2019   HGB 12.2 12/25/2019   HCT 37.7 12/25/2019   PLT 236 12/25/2019   GLUCOSE 115 (H) 12/25/2019   CHOL 135 11/09/2019   TRIG 107 11/09/2019   HDL 65 11/09/2019   LDLDIRECT 105.0 11/23/2010   LDLCALC 51 11/09/2019   ALT 12 11/09/2019    AST 17 11/09/2019   NA 138 12/25/2019   K 3.3 (L) 12/25/2019   CL 105 12/25/2019   CREATININE 0.76 12/25/2019   BUN 12 12/25/2019   CO2 28 12/25/2019   TSH 0.42 11/09/2019   INR 0.99 03/29/2013   MICROALBUR 0.8 11/09/2019    DG Wrist Complete Right  Result Date: 12/25/2019 CLINICAL DATA:  Fall. EXAM: RIGHT WRIST - COMPLETE 3+ VIEW COMPARISON:  None. FINDINGS: Comminuted fracture distal radius with 1 shaft of ventral displacement. Fracture extends into the wrist joint. Avulsion of the ulnar styloid. Carpal bones intact. IMPRESSION: Comminuted and displaced fracture distal radius. Fracture ulnar styloid. Electronically Signed   By: Franchot Gallo M.D.   On: 12/25/2019 09:27   DG Ankle Complete Right  Result  Date: 12/25/2019 CLINICAL DATA:  [place] down steps EXAM: RIGHT ANKLE - COMPLETE 3+ VIEW COMPARISON:  None. FINDINGS: Small avulsion fracture of the dorsal talus appears acute. Nondisplaced fracture distal fibula. Ankle joint space normal. Mild calcaneal spurring. IMPRESSION: Acute avulsion fracture of the dorsal talus. Nondisplaced fracture distal fibula. Electronically Signed   By: Franchot Gallo M.D.   On: 12/25/2019 09:29   DG Chest Port 1 View  Result Date: 12/25/2019 CLINICAL DATA:  Preop. EXAM: PORTABLE CHEST 1 VIEW COMPARISON:  Chest radiographs 07/27/2019 and cardiac CT 07/28/2019 FINDINGS: The patient is slightly rotated to the right. The cardiomediastinal silhouette is within normal limits. No confluent airspace opacity, edema, sizable pleural effusion, or pneumothorax is identified. Surgical clips are noted in the left axilla and upper abdomen. No acute osseous abnormality is seen. IMPRESSION: No active disease. Electronically Signed   By: Logan Bores M.D.   On: 12/25/2019 10:02   DG Knee Complete 4 Views Right  Result Date: 12/25/2019 CLINICAL DATA:  [place] down stairs EXAM: RIGHT KNEE - COMPLETE 4+ VIEW COMPARISON:  None. FINDINGS: Negative for fracture or joint effusion Moderate  joint space narrowing and spurring in the medial joint compartment. Mild lateral joint space spurring. IMPRESSION: Negative for fracture. Electronically Signed   By: Franchot Gallo M.D.   On: 12/25/2019 09:31   DG MINI C-ARM IMAGE ONLY  Result Date: 12/25/2019 There is no interpretation for this exam.  This order is for images obtained during a surgical procedure.  Please See "Surgeries" Tab for more information regarding the procedure.     Assessment & Plan:  Plan  I am having Anna Jackson maintain her hydrochlorothiazide, FLUoxetine, NON FORMULARY, docusate sodium, atorvastatin, erythromycin, potassium chloride SA, ALPRAZolam, ibuprofen, oxyCODONE, ondansetron, and methocarbamol.  No orders of the defined types were placed in this encounter.   Problem List Items Addressed This Visit      Unprioritized   Closed fracture of distal end of right radius    Other Visit Diagnoses    Estrogen deficiency    -  Primary   Relevant Orders   DG Bone Density (Completed)   Comprehensive metabolic panel   Vitamin D (25 hydroxy)   Closed fracture of distal end of right fibula, unspecified fracture morphology, initial encounter          Follow-up: Return if symptoms worsen or fail to improve.  Ann Held, DO

## 2020-01-01 ENCOUNTER — Telehealth: Payer: Self-pay | Admitting: Family Medicine

## 2020-01-01 NOTE — Telephone Encounter (Signed)
Results from bone density

## 2020-01-02 ENCOUNTER — Other Ambulatory Visit: Payer: Self-pay

## 2020-01-02 MED ORDER — ALENDRONATE SODIUM 70 MG PO TABS: 70.0000 mg | ORAL_TABLET | ORAL | 3 refills | Status: DC

## 2020-01-02 NOTE — Telephone Encounter (Signed)
Pt called. VM left with results

## 2020-01-04 ENCOUNTER — Other Ambulatory Visit: Payer: Self-pay

## 2020-01-04 MED ORDER — VITAMIN D (ERGOCALCIFEROL) 1.25 MG (50000 UNIT) PO CAPS: 50000.0000 [IU] | ORAL_CAPSULE | ORAL | 1 refills | Status: DC

## 2020-01-18 ENCOUNTER — Other Ambulatory Visit: Payer: Self-pay | Admitting: Family Medicine

## 2020-01-18 DIAGNOSIS — F410 Panic disorder [episodic paroxysmal anxiety] without agoraphobia: Secondary | ICD-10-CM

## 2020-01-18 NOTE — Telephone Encounter (Signed)
Requesting:ALPRAZolam (XANAX) 0.25 MG tablet Contract:08/01/19 UDS:08/01/19 Last Visit:12/31/19 Next Visit: none Last Refill:12/03/19  Please Advise

## 2020-01-19 MED ORDER — ALPRAZOLAM 0.25 MG PO TABS: 0.2500 mg | ORAL_TABLET | Freq: Two times a day (BID) | ORAL | 0 refills | Status: DC | PRN

## 2020-02-01 ENCOUNTER — Other Ambulatory Visit: Payer: Self-pay | Admitting: Family Medicine

## 2020-02-01 DIAGNOSIS — F410 Panic disorder [episodic paroxysmal anxiety] without agoraphobia: Secondary | ICD-10-CM

## 2020-02-01 MED ORDER — ALPRAZOLAM 0.25 MG PO TABS: 0.2500 mg | ORAL_TABLET | Freq: Two times a day (BID) | ORAL | 0 refills | Status: DC | PRN

## 2020-02-01 NOTE — Telephone Encounter (Signed)
Requesting: xanax Contract: 11/29/2019 UDS:05/2017 Last Visit:12/31/2019 Next Visit: n/a Last Refill:01/19/2020  Please Advise

## 2020-02-04 ENCOUNTER — Encounter: Payer: Self-pay | Admitting: Gastroenterology

## 2020-02-27 ENCOUNTER — Other Ambulatory Visit: Payer: Self-pay | Admitting: Family Medicine

## 2020-02-27 DIAGNOSIS — F410 Panic disorder [episodic paroxysmal anxiety] without agoraphobia: Secondary | ICD-10-CM

## 2020-02-28 MED ORDER — ALPRAZOLAM 0.25 MG PO TABS: 0.2500 mg | ORAL_TABLET | Freq: Two times a day (BID) | ORAL | 0 refills | Status: DC | PRN

## 2020-02-28 NOTE — Telephone Encounter (Signed)
Requesting: alprozolam Contract: 08/01/19 UDS: 08/01/19 Last Visit: 12/31/19 Next Visit: none Last Refill: 02/01/20  Please Advise

## 2020-03-17 ENCOUNTER — Encounter: Payer: Managed Care, Other (non HMO) | Admitting: Gastroenterology

## 2020-04-01 ENCOUNTER — Other Ambulatory Visit: Payer: Self-pay | Admitting: Family Medicine

## 2020-04-01 DIAGNOSIS — F410 Panic disorder [episodic paroxysmal anxiety] without agoraphobia: Secondary | ICD-10-CM

## 2020-04-01 NOTE — Telephone Encounter (Signed)
Requesting: alprazolam 0.25mg  Contract: 08/02/2019 UDS: 08/02/2019 Last Visit: 12/31/2019 Next Visit: None Last Refill: 02/28/2020 #20 and 0RF  Please Advise

## 2020-04-02 MED ORDER — ALPRAZOLAM 0.25 MG PO TABS: 0.2500 mg | ORAL_TABLET | Freq: Two times a day (BID) | ORAL | 0 refills | Status: DC | PRN

## 2020-06-16 ENCOUNTER — Other Ambulatory Visit: Payer: Self-pay | Admitting: Family Medicine

## 2020-06-16 DIAGNOSIS — F410 Panic disorder [episodic paroxysmal anxiety] without agoraphobia: Secondary | ICD-10-CM

## 2020-06-16 NOTE — Telephone Encounter (Signed)
Patient is requesting a refill of the following medications: Requested Prescriptions   Pending Prescriptions Disp Refills   ALPRAZolam (XANAX) 0.25 MG tablet 20 tablet 0    Sig: Take 1 tablet (0.25 mg total) by mouth 2 (two) times daily as needed for anxiety.    Date of patient request: 06/16/20 Last office visit: 12/31/19 Date of last refill: 04/02/20 Last refill amount: 20 + 0 Follow up time period per chart: non scheduled

## 2020-06-17 MED ORDER — ALPRAZOLAM 0.25 MG PO TABS: 0.2500 mg | ORAL_TABLET | Freq: Two times a day (BID) | ORAL | 0 refills | Status: DC | PRN

## 2020-07-03 ENCOUNTER — Telehealth: Payer: Self-pay | Admitting: *Deleted

## 2020-07-03 NOTE — Telephone Encounter (Signed)
Spoke with patient and she has sore throat, sneezing, and cough.  She is willing to do VV but we do not have any openings today as well as all other [place].  Advised that she can call us first thing in the morning for same day appt or go to urgent care or ER.  If she cannot wait or gets worse to go to urgent care or ER.

## 2020-07-03 NOTE — Telephone Encounter (Signed)
Caller Phone Number [phone] Patient Name Anna Jackson Patient DOB Feb 06, 1961 Call Type Message Only Information Provided Reason for Call Request to Schedule Office Appointment Initial Comment Caller wants to schedule appointment today. Patient request to speak to RN No Additional Comment Office hours provided. Triage refused. Disp. Time Disposition Final User 07/03/2020 7:42:43 AM General Information Provided Yes Marshell Garfinkel

## 2020-07-14 ENCOUNTER — Other Ambulatory Visit: Payer: Self-pay | Admitting: Family Medicine

## 2020-07-14 DIAGNOSIS — E785 Hyperlipidemia, unspecified: Secondary | ICD-10-CM

## 2020-09-09 ENCOUNTER — Other Ambulatory Visit: Payer: Self-pay | Admitting: Family Medicine

## 2020-09-09 DIAGNOSIS — F410 Panic disorder [episodic paroxysmal anxiety] without agoraphobia: Secondary | ICD-10-CM

## 2020-09-09 MED ORDER — ALPRAZOLAM 0.25 MG PO TABS: 0.2500 mg | ORAL_TABLET | Freq: Two times a day (BID) | ORAL | 0 refills | Status: DC | PRN

## 2020-09-09 NOTE — Telephone Encounter (Signed)
Requesting: alprazolam Contract: 08/01/19 UDS: 08/01/19 Last Visit: 12/31/19 Next Visit: none Last Refill: 06/17/20  Please Advise

## 2020-10-07 ENCOUNTER — Other Ambulatory Visit: Payer: Self-pay | Admitting: Family Medicine

## 2020-10-07 DIAGNOSIS — F410 Panic disorder [episodic paroxysmal anxiety] without agoraphobia: Secondary | ICD-10-CM

## 2020-10-07 DIAGNOSIS — F419 Anxiety disorder, unspecified: Secondary | ICD-10-CM

## 2020-10-07 DIAGNOSIS — E785 Hyperlipidemia, unspecified: Secondary | ICD-10-CM

## 2020-10-08 ENCOUNTER — Encounter: Payer: Self-pay | Admitting: Family Medicine

## 2020-10-30 ENCOUNTER — Telehealth: Payer: Self-pay

## 2020-10-30 NOTE — Telephone Encounter (Signed)
Called and moved 10/7 appts to 10/11, she is aware of appt date/time.

## 2020-11-21 ENCOUNTER — Ambulatory Visit: Payer: Managed Care, Other (non HMO) | Admitting: Hematology and Oncology

## 2020-11-21 ENCOUNTER — Other Ambulatory Visit: Payer: Managed Care, Other (non HMO)

## 2020-11-24 ENCOUNTER — Other Ambulatory Visit: Payer: Self-pay | Admitting: Hematology and Oncology

## 2020-11-24 DIAGNOSIS — Z8579 Personal history of other malignant neoplasms of lymphoid, hematopoietic and related tissues: Secondary | ICD-10-CM

## 2020-11-25 ENCOUNTER — Inpatient Hospital Stay: Payer: 59 | Attending: Hematology and Oncology

## 2020-11-25 ENCOUNTER — Inpatient Hospital Stay (HOSPITAL_BASED_OUTPATIENT_CLINIC_OR_DEPARTMENT_OTHER): Payer: 59 | Admitting: Hematology and Oncology

## 2020-11-25 ENCOUNTER — Encounter: Payer: Self-pay | Admitting: Hematology and Oncology

## 2020-11-25 ENCOUNTER — Other Ambulatory Visit: Payer: Self-pay

## 2020-11-25 DIAGNOSIS — E6609 Other obesity due to excess calories: Secondary | ICD-10-CM | POA: Diagnosis not present

## 2020-11-25 DIAGNOSIS — Z8579 Personal history of other malignant neoplasms of lymphoid, hematopoietic and related tissues: Secondary | ICD-10-CM

## 2020-11-25 DIAGNOSIS — Z885 Allergy status to narcotic agent status: Secondary | ICD-10-CM | POA: Diagnosis not present

## 2020-11-25 DIAGNOSIS — Z8572 Personal history of non-Hodgkin lymphomas: Secondary | ICD-10-CM | POA: Insufficient documentation

## 2020-11-25 DIAGNOSIS — E876 Hypokalemia: Secondary | ICD-10-CM | POA: Diagnosis not present

## 2020-11-25 DIAGNOSIS — G629 Polyneuropathy, unspecified: Secondary | ICD-10-CM | POA: Diagnosis not present

## 2020-11-25 DIAGNOSIS — Z6832 Body mass index (BMI) 32.0-32.9, adult: Secondary | ICD-10-CM | POA: Diagnosis not present

## 2020-11-25 DIAGNOSIS — Z79899 Other long term (current) drug therapy: Secondary | ICD-10-CM | POA: Diagnosis not present

## 2020-11-25 LAB — CBC WITH DIFFERENTIAL/PLATELET
Abs Immature Granulocytes: 0.02 10*3/uL (ref 0.00–0.07)
Basophils Absolute: 0 10*3/uL (ref 0.0–0.1)
Basophils Relative: 1 %
Eosinophils Absolute: 0.2 10*3/uL (ref 0.0–0.5)
Eosinophils Relative: 2 %
HCT: 37.2 % (ref 36.0–46.0)
Hemoglobin: 12.4 g/dL (ref 12.0–15.0)
Immature Granulocytes: 0 %
Lymphocytes Relative: 19 %
Lymphs Abs: 1.5 10*3/uL (ref 0.7–4.0)
MCH: 28.8 pg (ref 26.0–34.0)
MCHC: 33.3 g/dL (ref 30.0–36.0)
MCV: 86.5 fL (ref 80.0–100.0)
Monocytes Absolute: 0.6 10*3/uL (ref 0.1–1.0)
Monocytes Relative: 7 %
Neutro Abs: 5.7 10*3/uL (ref 1.7–7.7)
Neutrophils Relative %: 71 %
Platelets: 298 10*3/uL (ref 150–400)
RBC: 4.3 MIL/uL (ref 3.87–5.11)
RDW: 13.2 % (ref 11.5–15.5)
WBC: 8 10*3/uL (ref 4.0–10.5)
nRBC: 0 % (ref 0.0–0.2)

## 2020-11-25 LAB — COMPREHENSIVE METABOLIC PANEL
ALT: 12 U/L (ref 0–44)
AST: 17 U/L (ref 15–41)
Albumin: 3.8 g/dL (ref 3.5–5.0)
Alkaline Phosphatase: 138 U/L — ABNORMAL HIGH (ref 38–126)
Anion gap: 12 (ref 5–15)
BUN: 12 mg/dL (ref 6–20)
CO2: 27 mmol/L (ref 22–32)
Calcium: 9.2 mg/dL (ref 8.9–10.3)
Chloride: 105 mmol/L (ref 98–111)
Creatinine, Ser: 0.87 mg/dL (ref 0.44–1.00)
GFR, Estimated: >60 mL/min (ref >60)
Glucose, Bld: 81 mg/dL (ref 70–99)
Potassium: 3.1 mmol/L — ABNORMAL LOW (ref 3.5–5.1)
Sodium: 144 mmol/L (ref 135–145)
Total Bilirubin: 0.8 mg/dL (ref 0.3–1.2)
Total Protein: 7 g/dL (ref 6.5–8.1)

## 2020-11-25 NOTE — Assessment & Plan Note (Signed)
She is currently on the waiting list to see medical weight management center We discussed strategies to help her with her weight loss effort

## 2020-11-25 NOTE — Assessment & Plan Note (Signed)
The hypokalemia is likely due to hydrochlorothiazide She is not symptomatic Observe

## 2020-11-25 NOTE — Progress Notes (Signed)
[facility] OFFICE PROGRESS NOTE  Patient Care Team: Carollee Herter, Alferd Apa, DO as PCP - Darreld Mclean, MD as Consulting Physician (Hematology and Oncology) Doran Stabler, MD as Consulting Physician (Gastroenterology) Garald Balding, MD as Consulting Physician (Orthopedic Surgery)  ASSESSMENT & PLAN:  History of lymphoma Examination is unremarkable for disease recurrence I do not feel strongly we need to pursue repeat CT imaging in the absence of symptoms The patient is more than 7 years out, a long-term cancer survivor She is comfortable of returning here once a year for follow-up I educated the patient signs and symptoms to watch out for We discussed the risk of cancer recurrence in association with obesity We discussed the importance of screening programs  Hypokalemia The hypokalemia is likely due to hydrochlorothiazide She is not symptomatic Observe  Class 1 obesity due to excess calories in adult She is currently on the waiting list to see medical weight management center We discussed strategies to help her with her weight loss effort  No orders of the defined types were placed in this encounter.   All questions were answered. The patient knows to call the clinic with any problems, questions or concerns. The total time spent in the appointment was 20 minutes encounter with patients including review of chart and various tests results, discussions about plan of care and coordination of care plan   Heath Lark, MD 11/25/2020 3:24 PM  INTERVAL HISTORY: Please see below for problem oriented charting. she returns for her annual follow-up for history of lymphoma Since last time I saw her, she felt well No new lymphadenopathy She was not able to make progress in her weight loss effort; however, she is currently on the waiting list to weight management center  REVIEW OF SYSTEMS:   Constitutional: Denies fevers, chills or abnormal weight loss Eyes:  Denies blurriness of vision Ears, nose, mouth, throat, and face: Denies mucositis or sore throat Respiratory: Denies cough, dyspnea or wheezes Cardiovascular: Denies palpitation, chest discomfort or lower extremity swelling Gastrointestinal:  Denies nausea, heartburn or change in bowel habits Skin: Denies abnormal skin rashes Lymphatics: Denies new lymphadenopathy or easy bruising Neurological:Denies numbness, tingling or new weaknesses Behavioral/Psych: Mood is stable, no new changes  All other systems were reviewed with the patient and are negative.  I have reviewed the past medical history, past surgical history, social history and family history with the patient and they are unchanged from previous note.  ALLERGIES:  is allergic to betadine [povidone iodine] and codeine.  MEDICATIONS:  Current Outpatient Medications  Medication Sig Dispense Refill   alendronate (FOSAMAX) 70 MG tablet Take 1 tablet (70 mg total) by mouth every 7 (seven) days. Take with a full glass of water on an empty stomach. 12 tablet 3   ALPRAZolam (XANAX) 0.25 MG tablet Take 1 tablet by mouth twice daily as needed for anxiety 20 tablet 0   atorvastatin (LIPITOR) 20 MG tablet Take 1 tablet by mouth once daily 90 tablet 0   docusate sodium (COLACE) 100 MG capsule Take 300 mg by mouth in the morning.      FLUoxetine (PROZAC) 40 MG capsule Take 1 capsule by mouth once daily 90 capsule 0   hydrochlorothiazide (HYDRODIURIL) 25 MG tablet Take 1 tablet (25 mg total) by mouth daily. 90 tablet 3   NON FORMULARY Take 1 tablet by mouth See admin instructions. Nature Made Super C Immune Complex tablets- Take 1 tablet by mouth once a day  potassium chloride SA (KLOR-CON) 20 MEQ tablet Take 1 tablet by mouth once daily 60 tablet 0   Vitamin D, Ergocalciferol, (DRISDOL) 1.25 MG (50000 UNIT) CAPS capsule Take 1 capsule by mouth once a week 12 capsule 0   No current facility-administered medications for this visit.    SUMMARY  OF ONCOLOGIC HISTORY: Oncology History Overview Note  Anaplastic Lymphoma, T-cell, 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)   Staging method: AJCC 7th Edition   Clinical: Stage II signed by Heath Lark, MD on 04/03/2013  3:31 PM   Summary: Stage II    History of lymphoma  02/07/2013 Imaging   [place] confirmed enlarged LN   03/09/2013 Procedure   LN biopsy confirmed T cell lymphoma   03/23/2013 Procedure   She has port placement   03/26/2013 Imaging   Echo showed normal ejection fraction   03/29/2013 Bone Marrow Biopsy   Bone marrow biopsy was negative   04/02/2013 Imaging   PET/CT scan showed localized, stage II disease   04/04/2013 - 07/20/2013 Chemotherapy   She has completed 6 cycles of CHOP plus etoposide chemotherapy.   06/01/2013 Imaging   PET CT scan show complete response to treatment   06/27/2013 Adverse Reaction   Dose of vincristine was reduced due to peripheral neuropathy.   08/31/2013 Imaging   Repeat PET scans show no evidence of disease.   06/10/2014 Imaging   CT chest showed no evidence of recurrence     PHYSICAL EXAMINATION: ECOG PERFORMANCE STATUS: 0 - Asymptomatic  Vitals:   11/25/20 1355  BP: 132/78  Pulse: 75  Resp: 18  Temp: 97.7 F (36.5 C)  SpO2: 99%   Filed Weights   11/25/20 1355  Weight: 220 lb (99.8 kg)    GENERAL:alert, no distress and comfortable SKIN: skin color, texture, turgor are normal, no rashes or significant lesions EYES: normal, Conjunctiva are pink and non-injected, sclera clear OROPHARYNX:no exudate, no erythema and lips, buccal mucosa, and tongue normal  NECK: supple, thyroid normal size, non-tender, without nodularity LYMPH:  no palpable lymphadenopathy in the cervical, axillary or inguinal LUNGS: clear to auscultation and percussion with normal breathing effort HEART: regular rate & rhythm and no murmurs and no lower extremity edema ABDOMEN:abdomen soft, non-tender and normal bowel  sounds Musculoskeletal:no cyanosis of digits and no clubbing  NEURO: alert & oriented x 3 with fluent speech, no focal motor/sensory deficits  LABORATORY DATA:  I have reviewed the data as listed    Component Value Date/Time   NA 144 11/25/2020 1321   NA 141 11/18/2016 0754   K 3.1 (L) 11/25/2020 1321   K 4.0 11/18/2016 0754   CL 105 11/25/2020 1321   CO2 27 11/25/2020 1321   CO2 25 11/18/2016 0754   GLUCOSE 81 11/25/2020 1321   GLUCOSE 90 11/18/2016 0754   BUN 12 11/25/2020 1321   BUN 14.1 11/18/2016 0754   CREATININE 0.87 11/25/2020 1321   CREATININE 0.60 11/09/2019 1434   CREATININE 0.8 11/18/2016 0754   CALCIUM 9.2 11/25/2020 1321   CALCIUM 9.5 11/18/2016 0754   PROT 7.0 11/25/2020 1321   PROT 6.7 11/18/2016 0754   ALBUMIN 3.8 11/25/2020 1321   ALBUMIN 3.5 11/18/2016 0754   AST 17 11/25/2020 1321   AST 14 11/18/2016 0754   ALT 12 11/25/2020 1321   ALT 19 11/18/2016 0754   ALKPHOS 138 (H) 11/25/2020 1321   ALKPHOS 143 11/18/2016 0754   BILITOT 0.8 11/25/2020 1321   BILITOT 0.46 11/18/2016  0754   GFRNONAA >60 11/25/2020 1321   GFRAA >60 07/28/2019 1102    No results found for: SPEP, UPEP  Lab Results  Component Value Date   WBC 8.0 11/25/2020   NEUTROABS 5.7 11/25/2020   HGB 12.4 11/25/2020   HCT 37.2 11/25/2020   MCV 86.5 11/25/2020   PLT 298 11/25/2020      Chemistry      Component Value Date/Time   NA 144 11/25/2020 1321   NA 141 11/18/2016 0754   K 3.1 (L) 11/25/2020 1321   K 4.0 11/18/2016 0754   CL 105 11/25/2020 1321   CO2 27 11/25/2020 1321   CO2 25 11/18/2016 0754   BUN 12 11/25/2020 1321   BUN 14.1 11/18/2016 0754   CREATININE 0.87 11/25/2020 1321   CREATININE 0.60 11/09/2019 1434   CREATININE 0.8 11/18/2016 0754      Component Value Date/Time   CALCIUM 9.2 11/25/2020 1321   CALCIUM 9.5 11/18/2016 0754   ALKPHOS 138 (H) 11/25/2020 1321   ALKPHOS 143 11/18/2016 0754   AST 17 11/25/2020 1321   AST 14 11/18/2016 0754   ALT 12  11/25/2020 1321   ALT 19 11/18/2016 0754   BILITOT 0.8 11/25/2020 1321   BILITOT 0.46 11/18/2016 0754

## 2020-11-25 NOTE — Assessment & Plan Note (Signed)
Examination is unremarkable for disease recurrence I do not feel strongly we need to pursue repeat CT imaging in the absence of symptoms The patient is more than 7 years out, a long-term cancer survivor She is comfortable of returning here once a year for follow-up I educated the patient signs and symptoms to watch out for We discussed the risk of cancer recurrence in association with obesity We discussed the importance of screening programs

## 2020-12-04 ENCOUNTER — Other Ambulatory Visit: Payer: Self-pay

## 2020-12-09 ENCOUNTER — Ambulatory Visit (INDEPENDENT_AMBULATORY_CARE_PROVIDER_SITE_OTHER): Payer: 59 | Admitting: Family Medicine

## 2020-12-09 ENCOUNTER — Other Ambulatory Visit: Payer: Self-pay

## 2020-12-09 ENCOUNTER — Encounter: Payer: Self-pay | Admitting: Family Medicine

## 2020-12-09 VITALS — BP 128/90 | HR 57 | Temp 98.0°F | Resp 18 | Ht 68.0 in | Wt 218.6 lb

## 2020-12-09 DIAGNOSIS — Z23 Encounter for immunization: Secondary | ICD-10-CM | POA: Diagnosis not present

## 2020-12-09 DIAGNOSIS — Z0000 Encounter for general adult medical examination without abnormal findings: Secondary | ICD-10-CM

## 2020-12-09 DIAGNOSIS — E785 Hyperlipidemia, unspecified: Secondary | ICD-10-CM

## 2020-12-09 DIAGNOSIS — T451X5A Adverse effect of antineoplastic and immunosuppressive drugs, initial encounter: Secondary | ICD-10-CM

## 2020-12-09 DIAGNOSIS — I10 Essential (primary) hypertension: Secondary | ICD-10-CM

## 2020-12-09 DIAGNOSIS — M8580 Other specified disorders of bone density and structure, unspecified site: Secondary | ICD-10-CM

## 2020-12-09 DIAGNOSIS — G620 Drug-induced polyneuropathy: Secondary | ICD-10-CM

## 2020-12-09 DIAGNOSIS — F410 Panic disorder [episodic paroxysmal anxiety] without agoraphobia: Secondary | ICD-10-CM

## 2020-12-09 DIAGNOSIS — F419 Anxiety disorder, unspecified: Secondary | ICD-10-CM

## 2020-12-09 DIAGNOSIS — F325 Major depressive disorder, single episode, in full remission: Secondary | ICD-10-CM | POA: Insufficient documentation

## 2020-12-09 DIAGNOSIS — E559 Vitamin D deficiency, unspecified: Secondary | ICD-10-CM

## 2020-12-09 DIAGNOSIS — I208 Other forms of angina pectoris: Secondary | ICD-10-CM

## 2020-12-09 DIAGNOSIS — C8590 Non-Hodgkin lymphoma, unspecified, unspecified site: Secondary | ICD-10-CM

## 2020-12-09 DIAGNOSIS — I2089 Other forms of angina pectoris: Secondary | ICD-10-CM

## 2020-12-09 LAB — MICROALBUMIN / CREATININE URINE RATIO
Creatinine,U: 155.8 mg/dL
Microalb Creat Ratio: 0.6 mg/g (ref 0.0–30.0)
Microalb, Ur: 0.9 mg/dL (ref 0.0–1.9)

## 2020-12-09 MED ORDER — VITAMIN D (ERGOCALCIFEROL) 1.25 MG (50000 UNIT) PO CAPS: 50000.0000 [IU] | ORAL_CAPSULE | ORAL | 1 refills | Status: DC

## 2020-12-09 MED ORDER — FLUOXETINE HCL 40 MG PO CAPS: 40.0000 mg | ORAL_CAPSULE | Freq: Every day | ORAL | 1 refills | Status: DC

## 2020-12-09 MED ORDER — ALPRAZOLAM 0.25 MG PO TABS: 0.2500 mg | ORAL_TABLET | Freq: Two times a day (BID) | ORAL | 1 refills | Status: DC | PRN

## 2020-12-09 MED ORDER — POTASSIUM CHLORIDE CRYS ER 20 MEQ PO TBCR: 20.0000 meq | EXTENDED_RELEASE_TABLET | Freq: Every day | ORAL | 1 refills | Status: DC

## 2020-12-09 MED ORDER — HYDROCHLOROTHIAZIDE 25 MG PO TABS: 25.0000 mg | ORAL_TABLET | Freq: Every day | ORAL | 3 refills | Status: DC

## 2020-12-09 MED ORDER — ALENDRONATE SODIUM 70 MG PO TABS: 70.0000 mg | ORAL_TABLET | ORAL | 3 refills | Status: DC

## 2020-12-09 MED ORDER — ATORVASTATIN CALCIUM 20 MG PO TABS: 20.0000 mg | ORAL_TABLET | Freq: Every day | ORAL | 1 refills | Status: DC

## 2020-12-09 NOTE — Assessment & Plan Note (Signed)
Well controlled, no changes to meds. Encouraged heart healthy diet such as the DASH diet and exercise as tolerated.  °

## 2020-12-09 NOTE — Progress Notes (Signed)
Subjective:   By signing my name below, I, Shehryar Baig, attest that this documentation has been prepared under the direction and in the presence of Dr. Roma Schanz, DO. 12/09/2020    Patient ID: Anna Jackson, female    DOB: 03/23/1960, 60 y.o.   MRN: [id number]  Chief Complaint  Patient presents with   Annual Exam    Pt states fasting     HPI Patient is in today for a comprehensive physical exam.   She is requesting a refill on 40 mg Prozac daily PO, 0.25 mg xanax daily PO, 20 mg Lipitor daily PO, 70 mg Fosamax daily PO.  Her blood pressure is slightly elevated. She continues taking potassium supplements, 25 mg hydrochlorothiazide daily PO and reports no new issues while taking it. She is requesting a refill on them as well.   BP Readings from Last 3 Encounters:  12/09/20 128/90  11/25/20 132/78  12/31/19 100/70   Pulse Readings from Last 3 Encounters:  12/09/20 (!) 57  11/25/20 75  12/31/19 63   She reports being on the wait list for a healthy weight and wellness program.  She participates in exercise by occasionally walking.  She denies having any fever, new moles, congestion, sore throat, muscle pain, joint pain, chest pain, cough, SOB, wheezing, n/v/d, constipation, blood in stool, dysuria, frequency, hematuria, or headaches at this time. She has no recent changes to her family medical history.  She is due for mammogram and is planning on scheduling an appointment after this visit.  She is due for the tetanus vaccine and is interested in getting it during this visit. She is eligible for the shingrix vaccine and is interested in getting it at a later date at her pharmacy. She has 2 pfizer Covid-19 vaccines and was informed of the bivalent Covid-19 vaccine. She is interested in getting the flu vaccine at a different date from her tetanus vaccine.    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   Mental disorder    Neuropathic pain 04/04/2014   Non Hodgkin's lymphoma ([place]) 2015   Skin infection 11/02/2013   Unspecified asthma(493.90)    Unspecified essential hypertension    URI (upper respiratory infection) 01/15/2014    Past Surgical History:  Procedure Laterality Date   AXILLARY LYMPH NODE DISSECTION Left 03/09/2013   Procedure: EXCISION LEFT AXILLARY LYMPH NODES;  Surgeon: [name], MD;  Location: [facility];  Service: General;  Laterality: Left;   BREAST BIOPSY Left 02/13/2013   high risk   BREAST EXCISIONAL BIOPSY Left 03/09/2013   high risk   CERVICAL CONIZATION W/BX     CHOLECYSTECTOMY  1995   Open, done with Hiatal hernia repair   ESOPHAGEAL MANOMETRY N/A 01/12/2017   Procedure: ESOPHAGEAL MANOMETRY (EM);  Surgeon: Doran Stabler, MD;  Location: [facility];  Service: Gastroenterology;  Laterality: N/A;   INCISIONAL HERNIA REPAIR  2004   Dr Nedra Hai.  Parietex 20x15cm mesh   KNEE SURGERY Right 02/2010   Dr Shellia Carwin, Arthroscopic   NISSEN FUNDOPLICATION  1995   OPEN, Dr Lennie Hummer   ORIF WRIST FRACTURE Right 12/25/2019   Procedure: #1 right wrist/radius open reduction internal fixation comminuted complex distal radius fracture with DVR Biomet cross lock plate and screw construct.  This was a greater than 3 part intra-articular fracture.  #2 AP lateral and oblique x-rays performed examined and interpreted by myself #3 evaluation anesthesia ulnar styloid fracture with subsequent closed treatment based upon stability    PORTACATH PLACEMENT N/A 03/23/2013   Procedure: INSERTION PORT-A-CATH;  Surgeon: [name], MD;  Location: [facility];  Service: General;  Laterality: N/A;   TUBAL  LIGATION     VAGINAL HYSTERECTOMY  2002   Dr Art Raphael Gibney    Family History  Problem Relation Age of Onset   Heart disease Mother    Coronary artery disease Other        with hernia repair   Migraines Other    Hypertension Other    Breast cancer Maternal Grandmother 48   Cancer Maternal Grandmother 50       breast   Heart disease Brother    Alcoholism Brother    Colon cancer Neg Hx     Social History   Socioeconomic History   Marital status: Single    Spouse name: Not on file   Number of children: 1   Years of education: Not on file   Highest education level: Not on file  Occupational History   Occupation: Biochemist, clinical: [employer]  Tobacco Use   Smoking status: Never   Smokeless tobacco: Never  Vaping Use   Vaping Use: Never used  Substance and Sexual Activity   Alcohol use: Yes    Comment: occasional   Drug use: No   Sexual activity: Not Currently    Partners: Male  Other Topics Concern   Not on file  Social History Narrative   Exercise-- walking   Social Determinants of Health   Financial Resource Strain: Not on file  Food Insecurity: Not on file  Transportation Needs: Not on file  Physical Activity: Not on file  Stress: Not on file  Social Connections: Not on file  Intimate Partner Violence: Not on file    Outpatient Medications Prior to Visit  Medication Sig Dispense Refill   docusate sodium (COLACE) 100 MG capsule Take 300 mg by mouth in the morning.      NON FORMULARY Take 1 tablet by mouth See admin instructions. Nature Made Super C Immune Complex tablets- Take 1 tablet by mouth once a day     alendronate (FOSAMAX) 70 MG tablet Take 1 tablet (70 mg total) by mouth every 7 (seven) days. Take with a full glass of water on an empty stomach. 12 tablet 3   ALPRAZolam (XANAX) 0.25 MG tablet Take 1 tablet by mouth twice daily as needed for anxiety 20 tablet 0   atorvastatin (LIPITOR) 20 MG tablet Take 1 tablet by mouth once daily 90  tablet 0   FLUoxetine (PROZAC) 40 MG capsule Take 1 capsule by mouth once daily 90 capsule 0   hydrochlorothiazide (HYDRODIURIL) 25 MG tablet Take 1 tablet (25 mg total) by mouth daily. 90 tablet 3   potassium chloride SA (KLOR-CON) 20 MEQ tablet Take 1 tablet by mouth once daily 60 tablet 0   Vitamin D, Ergocalciferol, (DRISDOL) 1.25 MG (50000 UNIT) CAPS capsule Take 1 capsule by mouth once a week 12 capsule 0   No facility-administered medications prior to visit.  Allergies  Allergen Reactions   Betadine [Povidone Iodine] Hives and Itching   Codeine Nausea And Vomiting    Review of Systems  Constitutional:  Negative for fever.  HENT:  Negative for congestion and sore throat.   Respiratory:  Negative for cough, shortness of breath and wheezing.   Cardiovascular:  Negative for chest pain.  Gastrointestinal:  Negative for blood in stool, constipation, diarrhea, nausea and vomiting.  Genitourinary:  Negative for dysuria, frequency and hematuria.  Musculoskeletal:  Negative for joint pain and myalgias.  Skin:        (-)New moles  Neurological:  Negative for headaches.      Objective:    Physical Exam Constitutional:      General: She is not in acute distress.    Appearance: Normal appearance. She is not ill-appearing.  HENT:     Head: Normocephalic and atraumatic.     Right Ear: Tympanic membrane, ear canal and external ear normal.     Left Ear: Tympanic membrane, ear canal and external ear normal.  Eyes:     Extraocular Movements: Extraocular movements intact.     Pupils: Pupils are equal, round, and reactive to light.  Cardiovascular:     Rate and Rhythm: Normal rate and regular rhythm.     Heart sounds: Normal heart sounds. No murmur heard.   No gallop.  Pulmonary:     Effort: Pulmonary effort is normal. No respiratory distress.     Breath sounds: Normal breath sounds. No wheezing or rales.  Abdominal:     General: There is no distension.     Palpations: Abdomen is  soft.     Tenderness: There is no abdominal tenderness. There is no guarding.  Skin:    General: Skin is warm and dry.  Neurological:     Mental Status: She is alert and oriented to person, place, and time.  Psychiatric:        Behavior: Behavior normal.        Judgment: Judgment normal.    BP 128/90 (BP Location: Right Arm, Patient Position: Sitting, Cuff Size: Normal)   Pulse (!) 57   Temp 98 F (36.7 C) (Oral)   Resp 18   Ht '5\' 8"'  (1.727 m)   Wt 218 lb 9.6 oz (99.2 kg)   SpO2 97%   BMI 33.24 kg/m  Wt Readings from Last 3 Encounters:  12/09/20 218 lb 9.6 oz (99.2 kg)  11/25/20 220 lb (99.8 kg)  12/31/19 214 lb 12.8 oz (97.4 kg)    Diabetic Foot Exam - Simple   No data filed    Lab Results  Component Value Date   WBC 8.0 11/25/2020   HGB 12.4 11/25/2020   HCT 37.2 11/25/2020   PLT 298 11/25/2020   GLUCOSE 81 11/25/2020   CHOL 135 11/09/2019   TRIG 107 11/09/2019   HDL 65 11/09/2019   LDLDIRECT 105.0 11/23/2010   LDLCALC 51 11/09/2019   ALT 12 11/25/2020   AST 17 11/25/2020   NA 144 11/25/2020   K 3.1 (L) 11/25/2020   CL 105 11/25/2020   CREATININE 0.87 11/25/2020   BUN 12 11/25/2020   CO2 27 11/25/2020   TSH 0.42 11/09/2019   INR 0.99 03/29/2013   MICROALBUR 0.8 11/09/2019    Lab Results  Component Value Date   TSH 0.42 11/09/2019   Lab Results  Component Value Date   WBC 8.0 11/25/2020   HGB 12.4 11/25/2020   HCT 37.2 11/25/2020   MCV 86.5  11/25/2020   PLT 298 11/25/2020   Lab Results  Component Value Date   NA 144 11/25/2020   K 3.1 (L) 11/25/2020   CHLORIDE 106 11/18/2016   CO2 27 11/25/2020   GLUCOSE 81 11/25/2020   BUN 12 11/25/2020   CREATININE 0.87 11/25/2020   BILITOT 0.8 11/25/2020   ALKPHOS 138 (H) 11/25/2020   AST 17 11/25/2020   ALT 12 11/25/2020   PROT 7.0 11/25/2020   ALBUMIN 3.8 11/25/2020   CALCIUM 9.2 11/25/2020   ANIONGAP 12 11/25/2020   EGFR 89 (L) 11/18/2016   GFR 97.22 12/31/2019   Lab Results  Component  Value Date   CHOL 135 11/09/2019   Lab Results  Component Value Date   HDL 65 11/09/2019   Lab Results  Component Value Date   LDLCALC 51 11/09/2019   Lab Results  Component Value Date   TRIG 107 11/09/2019   Lab Results  Component Value Date   CHOLHDL 2.1 11/09/2019   No results found for: HGBA1C  Mammogram- Last completed 01/19/2016. Results are normal. Repeat in 1 year. Due.  Dexa- Last completed 12/31/2019. Results showed she I osteopenic. Repeat in 2 years.  Colonoscopy- Last completed 11/11/2016. Results showed internal hemorrhoids that prolapse with straining but spontaneously regress to resting position, lipoma in proximal ascending colon, internal hemorrhoids, otherwise results are normal.      Assessment & Plan:   Problem List Items Addressed This Visit       Unprioritized   Essential hypertension (Chronic)    Well controlled, no changes to meds. Encouraged heart healthy diet such as the DASH diet and exercise as tolerated.       Relevant Medications   hydrochlorothiazide (HYDRODIURIL) 25 MG tablet   atorvastatin (LIPITOR) 20 MG tablet   Other Relevant Orders   Microalbumin / creatinine urine ratio   Angina at rest [facility])   Relevant Medications   hydrochlorothiazide (HYDRODIURIL) 25 MG tablet   atorvastatin (LIPITOR) 20 MG tablet   Neuropathy due to chemotherapeutic drug ([place])   Hyperlipidemia    Encourage heart healthy diet such as MIND or DASH diet, increase exercise, avoid trans fats, simple carbohydrates and processed foods, consider a krill or fish or flaxseed oil cap daily.       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[place])    Per oncology      Relevant Medications   ALPRAZolam (XANAX) 0.25 MG tablet   Major depressive disorder with single episode, in full remission (HCC)   Relevant  Medications   FLUoxetine (PROZAC) 40 MG capsule   ALPRAZolam (XANAX) 0.25 MG tablet   Obesity, morbid (HCC)    Pt is on the waiting list for H[facility]      Other Visit Diagnoses     Preventative health care    -  Primary   Relevant Orders   TSH   CBC with Differential/Platelet   Comprehensive metabolic panel   Lipid panel   Need for influenza vaccination       Relevant Orders   Flu Vaccine QUAD 57moIM (Fluarix, Fluzone & Alfiuria Quad PF) (Completed)   Need for Tdap vaccination       Relevant Orders   Tdap vaccine greater than or equal to 7yo IM (Completed)   Primary hypertension       Relevant Medications  potassium chloride SA (KLOR-CON) 20 MEQ tablet   hydrochlorothiazide (HYDRODIURIL) 25 MG tablet   atorvastatin (LIPITOR) 20 MG tablet   Other Relevant Orders   Microalbumin / creatinine urine ratio   Anxiety       Relevant Medications   FLUoxetine (PROZAC) 40 MG capsule   ALPRAZolam (XANAX) 0.25 MG tablet   Dyslipidemia       Relevant Medications   atorvastatin (LIPITOR) 20 MG tablet   Severe anxiety with panic       Relevant Medications   FLUoxetine (PROZAC) 40 MG capsule   ALPRAZolam (XANAX) 0.25 MG tablet   Vitamin D deficiency       Relevant Medications   Vitamin D, Ergocalciferol, (DRISDOL) 1.25 MG (50000 UNIT) CAPS capsule   Osteopenia, unspecified location       Relevant Medications   alendronate (FOSAMAX) 70 MG tablet        Meds ordered this encounter  Medications   Vitamin D, Ergocalciferol, (DRISDOL) 1.25 MG (50000 UNIT) CAPS capsule    Sig: Take 1 capsule (50,000 Units total) by mouth once a week.    Dispense:  12 capsule    Refill:  1   potassium chloride SA (KLOR-CON) 20 MEQ tablet    Sig: Take 1 tablet (20 mEq total) by mouth daily.    Dispense:  60 tablet    Refill:  1   hydrochlorothiazide (HYDRODIURIL) 25 MG tablet    Sig: Take 1 tablet (25 mg total) by mouth daily.    Dispense:  90 tablet    Refill:  3   FLUoxetine (PROZAC) 40 MG  capsule    Sig: Take 1 capsule (40 mg total) by mouth daily.    Dispense:  90 capsule    Refill:  1   atorvastatin (LIPITOR) 20 MG tablet    Sig: Take 1 tablet (20 mg total) by mouth daily.    Dispense:  90 tablet    Refill:  1   ALPRAZolam (XANAX) 0.25 MG tablet    Sig: Take 1 tablet (0.25 mg total) by mouth 2 (two) times daily as needed. for anxiety    Dispense:  20 tablet    Refill:  1   alendronate (FOSAMAX) 70 MG tablet    Sig: Take 1 tablet (70 mg total) by mouth every 7 (seven) days. Take with a full glass of water on an empty stomach.    Dispense:  12 tablet    Refill:  3    I, Dr. Roma Schanz, DO, personally preformed the services described in this documentation.  All medical record entries made by the scribe were at my direction and in my presence.  I have reviewed the chart and discharge instructions (if applicable) and agree that the record reflects my personal performance and is accurate and complete. 12/09/2020   I,Shehryar Baig,acting as a scribe for Ann Held, DO.,have documented all relevant documentation on the behalf of Ann Held, DO,as directed by  Ann Held, DO while in the presence of Ann Held, DO.   Ann Held, DO

## 2020-12-09 NOTE — Assessment & Plan Note (Signed)
Per oncology °

## 2020-12-09 NOTE — Assessment & Plan Note (Signed)
Pt is on the waiting list for [facility]

## 2020-12-09 NOTE — Patient Instructions (Signed)
Preventive Care 40-58 Years Old, Female Preventive care refers to lifestyle choices and visits with your health care provider that can promote health and wellness. This includes: A yearly physical exam. This is also called an annual wellness visit. Regular dental and eye exams. Immunizations. Screening for certain conditions. Healthy lifestyle choices, such as: Eating a healthy diet. Getting regular exercise. Not using drugs or products that contain nicotine and tobacco. Limiting alcohol use. What can I expect for my preventive care visit? Physical exam Your health care provider will check your: Height and weight. These may be used to calculate your BMI (body mass index). BMI is a measurement that tells if you are at a healthy weight. Heart rate and blood pressure. Body temperature. Skin for abnormal spots. Counseling Your health care provider may ask you questions about your: Past medical problems. Family's medical history. Alcohol, tobacco, and drug use. Emotional well-being. Home life and relationship well-being. Sexual activity. Diet, exercise, and sleep habits. Work and work environment. Access to firearms. Method of birth control. Menstrual cycle. Pregnancy history. What immunizations do I need? Vaccines are usually given at various ages, according to a schedule. Your health care provider will recommend vaccines for you based on your age, medical history, and lifestyle or other factors, such as travel or where you work. What tests do I need? Blood tests Lipid and cholesterol levels. These may be checked every 5 years, or more often if you are over 50 years old. Hepatitis C test. Hepatitis B test. Screening Lung cancer screening. You may have this screening every year starting at age 55 if you have a 30-pack-year history of smoking and currently smoke or have quit within the past 15 years. Colorectal cancer screening. All adults should have this screening starting at  age 50 and continuing until age 75. Your health care provider may recommend screening at age 45 if you are at increased risk. You will have tests every 1-10 years, depending on your results and the type of screening test. Diabetes screening. This is done by checking your blood sugar (glucose) after you have not eaten for a while (fasting). You may have this done every 1-3 years. Mammogram. This may be done every 1-2 years. Talk with your health care provider about when you should start having regular mammograms. This may depend on whether you have a family history of breast cancer. BRCA-related cancer screening. This may be done if you have a family history of breast, ovarian, tubal, or peritoneal cancers. Pelvic exam and Pap test. This may be done every 3 years starting at age 21. Starting at age 30, this may be done every 5 years if you have a Pap test in combination with an HPV test. Other tests STD (sexually transmitted disease) testing, if you are at risk. Bone density scan. This is done to screen for osteoporosis. You may have this scan if you are at high risk for osteoporosis. Talk with your health care provider about your test results, treatment options, and if necessary, the need for more tests. Follow these instructions at home: Eating and drinking  Eat a diet that includes fresh fruits and vegetables, whole grains, lean protein, and low-fat dairy products. Take vitamin and mineral supplements as recommended by your health care provider. Do not drink alcohol if: Your health care provider tells you not to drink. You are pregnant, may be pregnant, or are planning to become pregnant. If you drink alcohol: Limit how much you have to 0-1 drink a day. Be   aware of how much alcohol is in your drink. In the [place] one drink equals one 12 oz bottle of beer (355 mL), one 5 oz glass of wine (148 mL), or one 1 oz glass of hard liquor (44 mL). Lifestyle Take daily care of your teeth and  gums. Brush your teeth every morning and night with fluoride toothpaste. Floss one time each day. Stay active. Exercise for at least 30 minutes 5 or more days each week. Do not use any products that contain nicotine or tobacco, such as cigarettes, e-cigarettes, and chewing tobacco. If you need help quitting, ask your health care provider. Do not use drugs. If you are sexually active, practice safe sex. Use a condom or other form of protection to prevent STIs (sexually transmitted infections). If you do not wish to become pregnant, use a form of birth control. If you plan to become pregnant, see your health care provider for a prepregnancy visit. If told by your health care provider, take low-dose aspirin daily starting at age 24. Find healthy ways to cope with stress, such as: Meditation, yoga, or listening to music. Journaling. Talking to a trusted person. Spending time with friends and family. Safety Always wear your seat belt while driving or riding in a vehicle. Do not drive: If you have been drinking alcohol. Do not ride with someone who has been drinking. When you are tired or distracted. While texting. Wear a helmet and other protective equipment during sports activities. If you have firearms in your house, make sure you follow all gun safety procedures. What's next? Visit your health care provider once a year for an annual wellness visit. Ask your health care provider how often you should have your eyes and teeth checked. Stay up to date on all vaccines. This information is not intended to replace advice given to you by your health care provider. Make sure you discuss any questions you have with your health care provider. Document Revised: 04/11/2020 Document Reviewed: 10/13/2017 Elsevier Patient Education  2022 [employer].

## 2020-12-09 NOTE — Assessment & Plan Note (Signed)
Encourage heart healthy diet such as MIND or DASH diet, increase exercise, avoid trans fats, simple carbohydrates and processed foods, consider a krill or fish or flaxseed oil cap daily.  °

## 2020-12-10 LAB — COMPREHENSIVE METABOLIC PANEL
ALT: 10 U/L (ref 0–35)
AST: 14 U/L (ref 0–37)
Albumin: 4.1 g/dL (ref 3.5–5.2)
Alkaline Phosphatase: 122 U/L — ABNORMAL HIGH (ref 39–117)
BUN: 15 mg/dL (ref 6–23)
CO2: 30 mEq/L (ref 19–32)
Calcium: 9.4 mg/dL (ref 8.4–10.5)
Chloride: 102 mEq/L (ref 96–112)
Creatinine, Ser: 0.76 mg/dL (ref 0.40–1.20)
GFR: 86.59 mL/min (ref >60.00)
Glucose, Bld: 94 mg/dL (ref 70–99)
Potassium: 3.3 mEq/L — ABNORMAL LOW (ref 3.5–5.1)
Sodium: 140 mEq/L (ref 135–145)
Total Bilirubin: 0.9 mg/dL (ref 0.2–1.2)
Total Protein: 6.4 g/dL (ref 6.0–8.3)

## 2020-12-10 LAB — CBC WITH DIFFERENTIAL/PLATELET
Basophils Absolute: 0.1 10*3/uL (ref 0.0–0.1)
Basophils Relative: 1.1 % (ref 0.0–3.0)
Eosinophils Absolute: 0.1 10*3/uL (ref 0.0–0.7)
Eosinophils Relative: 2.1 % (ref 0.0–5.0)
HCT: 37.8 % (ref 36.0–46.0)
Hemoglobin: 12.4 g/dL (ref 12.0–15.0)
Lymphocytes Relative: 30.8 % (ref 12.0–46.0)
Lymphs Abs: 1.8 10*3/uL (ref 0.7–4.0)
MCHC: 32.8 g/dL (ref 30.0–36.0)
MCV: 86.7 fl (ref 78.0–100.0)
Monocytes Absolute: 0.5 10*3/uL (ref 0.1–1.0)
Monocytes Relative: 8.2 % (ref 3.0–12.0)
Neutro Abs: 3.4 10*3/uL (ref 1.4–7.7)
Neutrophils Relative %: 57.8 % (ref 43.0–77.0)
Platelets: 270 10*3/uL (ref 150.0–400.0)
RBC: 4.36 Mil/uL (ref 3.87–5.11)
RDW: 13.6 % (ref 11.5–15.5)
WBC: 6 10*3/uL (ref 4.0–10.5)

## 2020-12-10 LAB — TSH: TSH: 0.62 u[IU]/mL (ref 0.35–5.50)

## 2020-12-10 LAB — LIPID PANEL
Cholesterol: 131 mg/dL (ref 0–200)
HDL: 81.6 mg/dL (ref >39.00)
LDL Cholesterol: 34 mg/dL (ref 0–99)
NonHDL: 49.26
Total CHOL/HDL Ratio: 2
Triglycerides: 74 mg/dL (ref 0.0–149.0)
VLDL: 14.8 mg/dL (ref 0.0–40.0)

## 2021-02-20 ENCOUNTER — Ambulatory Visit: Payer: 59 | Admitting: Family Medicine

## 2021-02-20 ENCOUNTER — Encounter: Payer: Self-pay | Admitting: Family Medicine

## 2021-02-20 VITALS — BP 110/80 | HR 71 | Temp 97.7°F | Resp 18 | Ht 68.0 in | Wt 218.8 lb

## 2021-02-20 DIAGNOSIS — Z8579 Personal history of other malignant neoplasms of lymphoid, hematopoietic and related tissues: Secondary | ICD-10-CM | POA: Diagnosis not present

## 2021-02-20 DIAGNOSIS — I208 Other forms of angina pectoris: Secondary | ICD-10-CM

## 2021-02-20 DIAGNOSIS — T451X5A Adverse effect of antineoplastic and immunosuppressive drugs, initial encounter: Secondary | ICD-10-CM

## 2021-02-20 DIAGNOSIS — I10 Essential (primary) hypertension: Secondary | ICD-10-CM

## 2021-02-20 DIAGNOSIS — F325 Major depressive disorder, single episode, in full remission: Secondary | ICD-10-CM

## 2021-02-20 DIAGNOSIS — E162 Hypoglycemia, unspecified: Secondary | ICD-10-CM | POA: Diagnosis not present

## 2021-02-20 DIAGNOSIS — G620 Drug-induced polyneuropathy: Secondary | ICD-10-CM

## 2021-02-20 DIAGNOSIS — C8590 Non-Hodgkin lymphoma, unspecified, unspecified site: Secondary | ICD-10-CM | POA: Diagnosis not present

## 2021-02-20 DIAGNOSIS — R5383 Other fatigue: Secondary | ICD-10-CM

## 2021-02-20 NOTE — Progress Notes (Addendum)
Subjective:   By signing my name below, I, Zite Okoli, attest that this documentation has been prepared under the direction and in the presence of Ann Held, DO. 02/20/2021   Patient ID: Anna Jackson, female    DOB: 1960/02/20, 61 y.o.   MRN: [id number]  Chief Complaint  Patient presents with   Fatigue        Hot Flashes    X3 months, pt states having fatigue, hot flashes, shakes, and tunnel visions. Pt reports no syncope.     HPI Patient is in today for an office visit.  She reports she has been having episodes with anxiety attacks, hot flashes and tunnel vision. She has had about 4 episodes in the past week. They are often accompanied by loss of energy and intense hunger. She normally feels clammy and shaky before these episodes start but has never fainted. The last time she had the episode, she checked her blood sugar and it was 64. She uses 0.25 mg xanax to calm down when she has these episodes but notes her anxiety has reduced. She also reports feeling like there is lot of mucus in her chest but has a hard time coughing it up along with occasional headaches in the center of her forehead. Denies N/V/D, chest pain, congestion, SOB and melena.  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   Mental disorder    Neuropathic pain 04/04/2014   Non Hodgkin's lymphoma ([place]) 2015   Skin infection 11/02/2013   Unspecified asthma(493.90)    Unspecified essential hypertension    URI (upper respiratory infection) 01/15/2014    Past Surgical History:  Procedure Laterality Date   AXILLARY LYMPH NODE DISSECTION Left 03/09/2013   Procedure:  EXCISION LEFT AXILLARY LYMPH NODES;  Surgeon: [name], MD;  Location: [facility];  Service: General;  Laterality: Left;   BREAST BIOPSY Left 02/13/2013   high risk   BREAST EXCISIONAL BIOPSY Left 03/09/2013   high risk   CERVICAL CONIZATION W/BX     CHOLECYSTECTOMY  1995   Open, done with Hiatal hernia repair   ESOPHAGEAL MANOMETRY N/A 01/12/2017   Procedure: ESOPHAGEAL MANOMETRY (EM);  Surgeon: Doran Stabler, MD;  Location: [facility];  Service: Gastroenterology;  Laterality: N/A;   INCISIONAL HERNIA REPAIR  2004   Dr Nedra Hai.  Parietex 20x15cm mesh   KNEE SURGERY Right 02/2010   Dr Shellia Carwin, Arthroscopic   NISSEN FUNDOPLICATION  [id number]   OPEN, Dr Lennie Hummer   ORIF WRIST FRACTURE Right 12/25/2019   Procedure: #1 right wrist/radius open reduction internal fixation comminuted complex distal radius fracture with DVR Biomet cross lock plate and screw construct.  This was a greater than 3 part intra-articular fracture.  #2 AP lateral and oblique x-rays performed examined and interpreted by myself #3 evaluation anesthesia ulnar styloid fracture with subsequent closed treatment based upon stability    PORTACATH PLACEMENT N/A 03/23/2013   Procedure: INSERTION PORT-A-CATH;  Surgeon: [name], MD;  Location: [facility];  Service: General;  Laterality: N/A;  TUBAL LIGATION     VAGINAL HYSTERECTOMY  2002   Dr Art Raphael Gibney    Family History  Problem Relation Age of Onset   Heart disease Mother    Heart disease Brother    Alcoholism Brother    Breast cancer Maternal Grandmother 48   Cancer Maternal Grandmother 56       breast   Coronary artery disease Other        with hernia repair   Migraines Other    Hypertension Other    Diabetes Cousin    Colon cancer Neg Hx     Social History   Socioeconomic History   Marital status: Single    Spouse name: Not on file   Number of children: 1   Years of education: Not on file   Highest education level: Not on file   Occupational History   Occupation: RECEIVING Armed forces operational officer: [employer]  Tobacco Use   Smoking status: Never   Smokeless tobacco: Never  Vaping Use   Vaping Use: Never used  Substance and Sexual Activity   Alcohol use: Yes    Comment: occasional   Drug use: No   Sexual activity: Not Currently    Partners: Male  Other Topics Concern   Not on file  Social History Narrative   Exercise-- walking   Social Determinants of Health   Financial Resource Strain: Not on file  Food Insecurity: Not on file  Transportation Needs: Not on file  Physical Activity: Not on file  Stress: Not on file  Social Connections: Not on file  Intimate Partner Violence: Not on file    Outpatient Medications Prior to Visit  Medication Sig Dispense Refill   alendronate (FOSAMAX) 70 MG tablet Take 1 tablet (70 mg total) by mouth every 7 (seven) days. Take with a full glass of water on an empty stomach. 12 tablet 3   ALPRAZolam (XANAX) 0.25 MG tablet Take 1 tablet (0.25 mg total) by mouth 2 (two) times daily as needed. for anxiety 20 tablet 1   atorvastatin (LIPITOR) 20 MG tablet Take 1 tablet (20 mg total) by mouth daily. 90 tablet 1   docusate sodium (COLACE) 100 MG capsule Take 300 mg by mouth in the morning.      FLUoxetine (PROZAC) 40 MG capsule Take 1 capsule (40 mg total) by mouth daily. 90 capsule 1   hydrochlorothiazide (HYDRODIURIL) 25 MG tablet Take 1 tablet (25 mg total) by mouth daily. 90 tablet 3   NON FORMULARY Take 1 tablet by mouth See admin instructions. Nature Made Super C Immune Complex tablets- Take 1 tablet by mouth once a day     potassium chloride SA (KLOR-CON) 20 MEQ tablet Take 1 tablet (20 mEq total) by mouth daily. 60 tablet 1   Vitamin D, Ergocalciferol, (DRISDOL) 1.25 MG (50000 UNIT) CAPS capsule Take 1 capsule (50,000 Units total) by mouth once a week. 12 capsule 1   No facility-administered medications prior to visit.    Allergies  Allergen Reactions   Betadine  [Povidone Iodine] Hives and Itching   Codeine Nausea And Vomiting    Review of Systems  Constitutional:  Positive for malaise/fatigue. Negative for fever.       (+) hot flashes   HENT:  Negative for congestion, ear pain, hearing loss, sinus pain and sore throat.   Eyes:  Negative for blurred vision and pain.  Respiratory:  Negative for cough, sputum production, shortness of breath and wheezing.   Cardiovascular:  Negative for chest pain and palpitations.  Gastrointestinal:  Negative for blood in stool, constipation, diarrhea, nausea and vomiting.  Genitourinary:  Negative for dysuria, frequency, hematuria and urgency.  Musculoskeletal:  Negative for back pain, falls and myalgias.  Neurological:  Positive for headaches. Negative for dizziness, sensory change, loss of consciousness and weakness.  Endo/Heme/Allergies:  Negative for environmental allergies. Does not bruise/bleed easily.  Psychiatric/Behavioral:  Negative for depression and suicidal ideas. The patient is nervous/anxious. The patient does not have insomnia.       Objective:    Physical Exam Constitutional:      General: She is not in acute distress.    Appearance: Normal appearance. She is not ill-appearing.  HENT:     Head: Normocephalic and atraumatic.     Right Ear: Tympanic membrane, ear canal and external ear normal.     Left Ear: Tympanic membrane, ear canal and external ear normal.  Eyes:     Extraocular Movements: Extraocular movements intact.     Pupils: Pupils are equal, round, and reactive to light.  Cardiovascular:     Rate and Rhythm: Normal rate and regular rhythm.     Pulses: Normal pulses.     Heart sounds: Normal heart sounds. No murmur heard.   No gallop.  Pulmonary:     Effort: Pulmonary effort is normal. No respiratory distress.     Breath sounds: Normal breath sounds. No wheezing, rhonchi or rales.  Abdominal:     General: Bowel sounds are normal. There is no distension.     Palpations:  Abdomen is soft. There is no mass.     Tenderness: There is no abdominal tenderness. There is no guarding or rebound.     Hernia: No hernia is present.  Musculoskeletal:     Cervical back: Normal range of motion and neck supple.  Lymphadenopathy:     Cervical: No cervical adenopathy.  Skin:    General: Skin is warm and dry.  Neurological:     Mental Status: She is alert and oriented to person, place, and time.  Psychiatric:        Behavior: Behavior normal.    BP 110/80 (BP Location: Right Arm, Patient Position: Sitting, Cuff Size: Large)    Pulse 71    Temp 97.7 F (36.5 C) (Oral)    Resp 18    Ht '5\' 8"'  (1.727 m)    Wt 218 lb 12.8 oz (99.2 kg)    SpO2 100%    BMI 33.27 kg/m  Wt Readings from Last 3 Encounters:  02/20/21 218 lb 12.8 oz (99.2 kg)  12/09/20 218 lb 9.6 oz (99.2 kg)  11/25/20 220 lb (99.8 kg)    Diabetic Foot Exam - Simple   No data filed    Lab Results  Component Value Date   WBC 5.9 02/20/2021   HGB 12.6 02/20/2021   HCT 37.7 02/20/2021   PLT 285 02/20/2021   GLUCOSE 88 02/20/2021   CHOL 137 02/20/2021   TRIG 80 02/20/2021   HDL 77 02/20/2021   LDLDIRECT 105.0 11/23/2010   LDLCALC 44 02/20/2021   ALT 12 02/20/2021   AST 16 02/20/2021   NA 141 02/20/2021   K 3.4 (L) 02/20/2021   CL 102 02/20/2021   CREATININE 0.74 02/20/2021   BUN 13 02/20/2021   CO2 30 02/20/2021   TSH 0.87 02/20/2021   INR 0.99 03/29/2013   HGBA1C 5.4 02/20/2021   MICROALBUR 0.9 12/09/2020    Lab Results  Component Value Date  TSH 0.87 02/20/2021   Lab Results  Component Value Date   WBC 5.9 02/20/2021   HGB 12.6 02/20/2021   HCT 37.7 02/20/2021   MCV 86.5 02/20/2021   PLT 285 02/20/2021   Lab Results  Component Value Date   NA 141 02/20/2021   K 3.4 (L) 02/20/2021   CHLORIDE 106 11/18/2016   CO2 30 02/20/2021   GLUCOSE 88 02/20/2021   BUN 13 02/20/2021   CREATININE 0.74 02/20/2021   BILITOT 1.1 02/20/2021   ALKPHOS 122 (H) 12/09/2020   AST 16 02/20/2021    ALT 12 02/20/2021   PROT 6.4 02/20/2021   ALBUMIN 4.1 12/09/2020   CALCIUM 9.2 02/20/2021   ANIONGAP 12 11/25/2020   EGFR 89 (L) 11/18/2016   GFR 86.59 12/09/2020   Lab Results  Component Value Date   CHOL 137 02/20/2021   Lab Results  Component Value Date   HDL 77 02/20/2021   Lab Results  Component Value Date   LDLCALC 44 02/20/2021   Lab Results  Component Value Date   TRIG 80 02/20/2021   Lab Results  Component Value Date   CHOLHDL 1.8 02/20/2021   Lab Results  Component Value Date   HGBA1C 5.4 02/20/2021       Assessment & Plan:   Problem List Items Addressed This Visit       Unprioritized   Essential hypertension (Chronic)    Well controlled, no changes to meds. Encouraged heart healthy diet such as the DASH diet and exercise as tolerated.       History of lymphoma (Chronic)    Per oncology      Relevant Orders   CBC with Differential/Platelet (Completed)   Comprehensive metabolic panel (Completed)   Lipid panel (Completed)   TSH (Completed)   Vitamin B12 (Completed)   VITAMIN D 25 Hydroxy (Vit-D Deficiency, Fractures) (Completed)   Lymphoma, T-cell (HCC)   Obesity, morbid (HCC)   Angina at rest [facility])    No recent chest pain      Major depressive disorder with single episode, in full remission ([place])    Pt doing well       Neuropathy due to chemotherapeutic drug (HCC)    Stable       Other fatigue - Primary    Check labs  Eat 5 small meals a day  F/u if no improvement        Relevant Orders   CBC with Differential/Platelet (Completed)   Comprehensive metabolic panel (Completed)   Lipid panel (Completed)   TSH (Completed)   Vitamin B12 (Completed)   VITAMIN D 25 Hydroxy (Vit-D Deficiency, Fractures) (Completed)   Iron, TIBC and Ferritin Panel (Completed)   Other Visit Diagnoses     Hypoglycemia       Relevant Orders   Hemoglobin A1c (Completed)        No orders of the defined types were placed in this  encounter.   I,Zite Okoli,acting as a Education administrator for [employer], DO.,have documented all relevant documentation on the behalf of Ann Held, DO,as directed by  Ann Held, DO while in the presence of [place], DO. , personally preformed the services described in this documentation.  All medical record entries made by the scribe were at my direction and in my presence.  I have reviewed the chart and discharge instructions (if applicable) and agree that the record reflects my personal performance  and is accurate and complete. 02/20/2021.

## 2021-02-20 NOTE — Patient Instructions (Signed)
Fatigue °If you have fatigue, you feel tired all the time and have a lack of energy or a lack of motivation. Fatigue may make it difficult to start or complete tasks because of exhaustion. In general, occasional or mild fatigue is often a normal response to activity or life. However, long-lasting (chronic) or extreme fatigue may be a symptom of a medical condition. °Follow these instructions at home: °General instructions °Watch your fatigue for any changes. °Go to bed and get up at the same time every day. °Avoid fatigue by pacing yourself during the day and getting enough sleep at night. °Maintain a healthy weight. °Medicines °Take over-the-counter and prescription medicines only as told by your health care provider. °Take a multivitamin, if told by your health care provider.  °Do not use herbal or dietary supplements unless they are approved by your health care provider. °Activity ° °Exercise regularly, as told by your health care provider. °Use or practice techniques to help you relax, such as yoga, tai chi, meditation, or massage therapy. °Eating and drinking ° °Avoid heavy meals in the evening. °Eat a well-balanced diet, which includes lean proteins, whole grains, plenty of fruits and vegetables, and low-fat dairy products. °Avoid consuming too much caffeine. °Avoid the use of alcohol. °Drink enough fluid to keep your urine pale yellow. °Lifestyle °Change situations that cause you stress. Try to keep your work and personal schedule in balance. °Do not use any products that contain nicotine or tobacco, such as cigarettes and e-cigarettes. If you need help quitting, ask your health care provider. °Do not use drugs. °Contact a health care provider if: °Your fatigue does not get better. °You have a fever. °You suddenly lose or gain weight. °You have headaches. °You have trouble falling asleep or sleeping through the night. °You feel angry, guilty, anxious, or sad. °You are unable to have a bowel movement  (constipation). °Your skin is dry. °You have swelling in your legs or another part of your body. °Get help right away if: °You feel confused. °Your vision is blurry. °You feel faint or you pass out. °You have a severe headache. °You have severe pain in your abdomen, your back, or the area between your waist and hips (pelvis). °You have chest pain, shortness of breath, or an irregular or fast heartbeat. °You are unable to urinate, or you urinate less than normal. °You have abnormal bleeding, such as bleeding from the rectum, vagina, nose, lungs, or nipples. °You vomit blood. °You have thoughts about hurting yourself or others. °If you ever feel like you may hurt yourself or others, or have thoughts about taking your own life, get help right away. You can go to your nearest emergency department or call: °Your local emergency services (911 in the [place] °A suicide crisis helpline, such as the National Suicide Prevention Lifeline at 1-800-273-8255 or 988 in the [place] This is open 24 hours a day. °Summary °If you have fatigue, you feel tired all the time and have a lack of energy or a lack of motivation. °Fatigue may make it difficult to start or complete tasks because of exhaustion. °Long-lasting (chronic) or extreme fatigue may be a symptom of a medical condition. °Exercise regularly, as told by your health care provider. °Change situations that cause you stress. Try to keep your work and personal schedule in balance. °This information is not intended to replace advice given to you by your health care provider. Make sure you discuss any questions you have with your health care provider. °Document Revised:   08/27/2020 Document Reviewed: 12/13/2019 °Elsevier Patient Education © 2022 [employer] ° °

## 2021-02-21 LAB — HEMOGLOBIN A1C
Hgb A1c MFr Bld: 5.4 % of total Hgb (ref <5.7)
Mean Plasma Glucose: 108 mg/dL
eAG (mmol/L): 6 mmol/L

## 2021-02-21 LAB — IRON,TIBC AND FERRITIN PANEL
%SAT: 24 % (calc) (ref 16–45)
Ferritin: 53 ng/mL (ref 16–232)
Iron: 79 ug/dL (ref 45–160)
TIBC: 330 mcg/dL (calc) (ref 250–450)

## 2021-02-21 LAB — COMPREHENSIVE METABOLIC PANEL
AG Ratio: 1.7 (calc) (ref 1.0–2.5)
ALT: 12 U/L (ref 6–29)
AST: 16 U/L (ref 10–35)
Albumin: 4 g/dL (ref 3.6–5.1)
Alkaline phosphatase (APISO): 129 U/L (ref 37–153)
BUN: 13 mg/dL (ref 7–25)
CO2: 30 mmol/L (ref 20–32)
Calcium: 9.2 mg/dL (ref 8.6–10.4)
Chloride: 102 mmol/L (ref 98–110)
Creat: 0.74 mg/dL (ref 0.50–1.03)
Globulin: 2.4 g/dL (calc) (ref 1.9–3.7)
Glucose, Bld: 88 mg/dL (ref 65–99)
Potassium: 3.4 mmol/L — ABNORMAL LOW (ref 3.5–5.3)
Sodium: 141 mmol/L (ref 135–146)
Total Bilirubin: 1.1 mg/dL (ref 0.2–1.2)
Total Protein: 6.4 g/dL (ref 6.1–8.1)

## 2021-02-21 LAB — CBC WITH DIFFERENTIAL/PLATELET
Absolute Monocytes: 460 cells/uL (ref 200–950)
Basophils Absolute: 41 cells/uL (ref 0–200)
Basophils Relative: 0.7 %
Eosinophils Absolute: 100 cells/uL (ref 15–500)
Eosinophils Relative: 1.7 %
HCT: 37.7 % (ref 35.0–45.0)
Hemoglobin: 12.6 g/dL (ref 11.7–15.5)
Lymphs Abs: 1864 cells/uL (ref 850–3900)
MCH: 28.9 pg (ref 27.0–33.0)
MCHC: 33.4 g/dL (ref 32.0–36.0)
MCV: 86.5 fL (ref 80.0–100.0)
MPV: 10.2 fL (ref 7.5–12.5)
Monocytes Relative: 7.8 %
Neutro Abs: 3434 cells/uL (ref 1500–7800)
Neutrophils Relative %: 58.2 %
Platelets: 285 10*3/uL (ref 140–400)
RBC: 4.36 10*6/uL (ref 3.80–5.10)
RDW: 13.2 % (ref 11.0–15.0)
Total Lymphocyte: 31.6 %
WBC: 5.9 10*3/uL (ref 3.8–10.8)

## 2021-02-21 LAB — TSH: TSH: 0.87 mIU/L (ref 0.40–4.50)

## 2021-02-21 LAB — VITAMIN B12: Vitamin B-12: 1133 pg/mL — ABNORMAL HIGH (ref 200–1100)

## 2021-02-21 LAB — LIPID PANEL
Cholesterol: 137 mg/dL (ref <200)
HDL: 77 mg/dL (ref >=50)
LDL Cholesterol (Calc): 44 mg/dL (calc)
Non-HDL Cholesterol (Calc): 60 mg/dL (calc) (ref <130)
Total CHOL/HDL Ratio: 1.8 (calc) (ref <5.0)
Triglycerides: 80 mg/dL (ref <150)

## 2021-02-21 LAB — VITAMIN D 25 HYDROXY (VIT D DEFICIENCY, FRACTURES): Vit D, 25-Hydroxy: 67 ng/mL (ref 30–100)

## 2021-02-25 DIAGNOSIS — R5383 Other fatigue: Secondary | ICD-10-CM | POA: Insufficient documentation

## 2021-02-25 NOTE — Assessment & Plan Note (Signed)
Well controlled, no changes to meds. Encouraged heart healthy diet such as the DASH diet and exercise as tolerated.  °

## 2021-02-25 NOTE — Assessment & Plan Note (Signed)
Per oncology °

## 2021-02-25 NOTE — Assessment & Plan Note (Signed)
Pt doing well.

## 2021-02-25 NOTE — Assessment & Plan Note (Signed)
Stable

## 2021-02-25 NOTE — Assessment & Plan Note (Addendum)
Check labs  Eat 5 small meals a day  F/u if no improvement

## 2021-02-25 NOTE — Assessment & Plan Note (Signed)
No recent chest pain  

## 2021-02-26 ENCOUNTER — Ambulatory Visit: Payer: 59 | Admitting: Family Medicine

## 2021-03-05 DIAGNOSIS — Z0289 Encounter for other administrative examinations: Secondary | ICD-10-CM

## 2021-03-18 ENCOUNTER — Other Ambulatory Visit: Payer: Self-pay

## 2021-03-18 ENCOUNTER — Ambulatory Visit (INDEPENDENT_AMBULATORY_CARE_PROVIDER_SITE_OTHER): Payer: 59 | Admitting: Bariatrics

## 2021-03-18 ENCOUNTER — Encounter (INDEPENDENT_AMBULATORY_CARE_PROVIDER_SITE_OTHER): Payer: Self-pay | Admitting: Bariatrics

## 2021-03-18 VITALS — BP 134/85 | HR 70 | Temp 98.0°F | Ht 66.0 in | Wt 218.0 lb

## 2021-03-18 DIAGNOSIS — K5900 Constipation, unspecified: Secondary | ICD-10-CM

## 2021-03-18 DIAGNOSIS — E559 Vitamin D deficiency, unspecified: Secondary | ICD-10-CM

## 2021-03-18 DIAGNOSIS — Z8572 Personal history of non-Hodgkin lymphomas: Secondary | ICD-10-CM

## 2021-03-18 DIAGNOSIS — Z6835 Body mass index (BMI) 35.0-35.9, adult: Secondary | ICD-10-CM

## 2021-03-18 DIAGNOSIS — I10 Essential (primary) hypertension: Secondary | ICD-10-CM

## 2021-03-18 DIAGNOSIS — Z8579 Personal history of other malignant neoplasms of lymphoid, hematopoietic and related tissues: Secondary | ICD-10-CM

## 2021-03-18 DIAGNOSIS — E66812 Obesity, class 2: Secondary | ICD-10-CM

## 2021-03-18 DIAGNOSIS — Z1331 Encounter for screening for depression: Secondary | ICD-10-CM | POA: Diagnosis not present

## 2021-03-18 DIAGNOSIS — R5383 Other fatigue: Secondary | ICD-10-CM

## 2021-03-18 DIAGNOSIS — E162 Hypoglycemia, unspecified: Secondary | ICD-10-CM

## 2021-03-18 DIAGNOSIS — E538 Deficiency of other specified B group vitamins: Secondary | ICD-10-CM | POA: Diagnosis not present

## 2021-03-18 DIAGNOSIS — E876 Hypokalemia: Secondary | ICD-10-CM

## 2021-03-18 DIAGNOSIS — R0602 Shortness of breath: Secondary | ICD-10-CM

## 2021-03-18 DIAGNOSIS — E668 Other obesity: Secondary | ICD-10-CM

## 2021-03-18 DIAGNOSIS — E785 Hyperlipidemia, unspecified: Secondary | ICD-10-CM | POA: Diagnosis not present

## 2021-03-18 NOTE — Progress Notes (Signed)
Chief Complaint:   Anna Jackson Jackson (MR# [id number]) is a 61 y.o. female who presents for evaluation and treatment of Anna Jackson and related comorbidities. Current BMI is Body mass index is 35.19 kg/m. Anna Jackson Jackson has been struggling with her weight for many years and has been unsuccessful in either losing weight, maintaining weight loss, or reaching her healthy weight goal.  Anna Jackson Jackson is currently in the action stage of change and ready to dedicate time achieving and maintaining a healthier weight. Anna Jackson Jackson is interested in becoming our patient and working on intensive lifestyle modifications including (but not limited to) diet and exercise for weight loss.  Anna Jackson Jackson's habits were reviewed today and are as follows: Her family eats meals together, she thinks her family will eat healthier with her, her desired weight loss is 48 pounds, she has been heavy most of her life, she started gaining weight after giving birth, her heaviest weight ever was 325 pounds, she snacks frequently in the evenings, she skips meals frequently, she is frequently drinking liquids with calories, she frequently makes poor food choices, she frequently eats larger portions than normal, and she has binge eating behaviors.  Depression Screen Anna Jackson Jackson's Food and Mood (modified PHQ-9) score was 6.  Depression screen Anna Jackson Jackson 2/9 03/18/2021  Decreased Interest 2  Down, Depressed, Hopeless 0  PHQ - 2 Score 2  Altered sleeping 0  Tired, decreased energy 2  Change in appetite 0  Feeling bad or failure about yourself  0  Trouble concentrating 2  Moving slowly or fidgety/restless 0  Suicidal thoughts 0  PHQ-9 Score 6  Difficult doing work/chores Not difficult at all  Some recent data might be hidden   Subjective:   1. Other fatigue Anna Jackson Jackson admits to daytime somnolence and admits to waking up still tired. Patient has a history of symptoms of daytime fatigue and morning fatigue. Anna Jackson Jackson generally gets 5 or 6 hours of sleep per night, and states  that she has difficulty falling asleep and generally restful sleep. Snoring is present. Apneic episodes are not present. Epworth Sleepiness Score is 6.    2. SOB (shortness of breath) Anna Jackson Jackson notes increasing shortness of breath with exercising and seems to be worsening over time with weight gain. She notes getting out of breath sooner with activity than she used to. This has not gotten worse recently. Anna Jackson Jackson denies shortness of breath at rest or orthopnea.   3. Essential hypertension Anna Jackson Jackson is currently taking HCTZ. Her blood pressure is reasonably well controlled. Her blood pressure today was 134/85.  4. History of lymphoma Anna Jackson Jackson is seeing a hematology/oncologist.   5. Dyslipidemia Anna Jackson Jackson is taking Atorvastatin currently.  6. Vitamin D deficiency Anna Jackson Jackson is taking Vitamin D high dose.  7. B12 deficiency Anna Jackson Jackson is not taking B 12 currently.  8. Hypokalemia Anna Jackson Jackson is currently taking Potassium.   9. Constipation, unspecified constipation type Anna Jackson Jackson notes constipation at times. She is currently taking Metamucil.  10. Hypoglycemia Handout on Hypoglycemia was provided to Anna Jackson Jackson today.   Assessment/Plan:   1. Other fatigue Anna Jackson Jackson does feel that her weight is causing her energy to be lower than it should be. Fatigue may be related to Anna Jackson, depression or many other causes. Labs will be ordered, and in the meanwhile, Anna Jackson Jackson will focus on self care including making healthy food choices, increasing physical activity and focusing on stress reduction.  - EKG 12-Lead  2. SOB (shortness of breath) Anna Jackson Jackson does feel that she gets out of breath more easily that she used to  when she exercises. Anna Jackson Jackson's shortness of breath appears to be Anna Jackson related and exercise induced. She has agreed to work on weight loss and gradually increase exercise to treat her exercise induced shortness of breath. Will continue to monitor closely.   3. Essential hypertension Anna Jackson Jackson will continue taking her medications.She is working on healthy  weight loss and exercise to improve blood pressure control. We will watch for signs of hypotension as she continues her lifestyle modifications.  4. History of lymphoma Anna Jackson Jackson will follow up with hematology/oncologist.  5. Dyslipidemia Anna Jackson Jackson will continue taking Atorvastatin.  6. Vitamin D deficiency Low Vitamin D level contributes to fatigue and are associated with Anna Jackson, breast, and colon cancer. Anna Jackson Jackson will continue taking Vitamin D and she will follow-up for routine testing of Vitamin D, at least 2-3 times per year to avoid over-replacement.  7. B12 deficiency The diagnosis was reviewed with the patient. We will check periodically. Counseling provided today, see below. We will continue to monitor. Orders and follow up as documented in patient record.  Counseling The body needs vitamin B12: to make red blood cells; to make DNA; and to help the nerves work properly so they can carry messages from the brain to the body.  The main causes of vitamin B12 deficiency include dietary deficiency, digestive diseases, pernicious anemia, and having a surgery in which part of the stomach or small intestine is removed.  Certain medicines can make it harder for the body to absorb vitamin B12. These medicines include: heartburn medications; some antibiotics; some medications used to treat diabetes, gout, and high cholesterol.  In some cases, there are no symptoms of this condition. If the condition leads to anemia or nerve damage, various symptoms can occur, such as weakness or fatigue, shortness of breath, and numbness or tingling in your hands and feet.   Treatment:  May include taking vitamin B12 supplements.  Avoid alcohol.  Eat lots of healthy foods that contain vitamin B12: Beef, pork, chicken, [place], and organ meats, such as liver.  Seafood: This includes clams, rainbow trout, salmon, tuna, and haddock. Eggs.  Cereal and dairy products that are fortified: This means that vitamin B12 has been added  to the food.    8. Hypokalemia Anna Jackson Jackson will continue taking Potassium.   9. Constipation, unspecified constipation type Anna Jackson Jackson will continue taking Metamucil.  10. Hypoglycemia I discussed Hypoglycemia with Anna Jackson Jackson today. We will check CMP and insulin today.  - Comprehensive metabolic panel - Insulin, random  11. Depression screen Anna Jackson Jackson had a positive depression screening. Depression is commonly associated with Anna Jackson and often results in emotional eating behaviors. We will monitor this closely and work on CBT to help improve the non-hunger eating patterns. Referral to Psychology may be required if no improvement is seen as she continues in our clinic.   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 I recommend Anna Jackson Jackson begin the structured treatment plan as follows:  She has agreed to the Category 2 Plan.  We reviewed labs from 02/20/2021 CMP, Lipids, anemia panel, Vitamin D, B 12, CBC, A1C and thyroid panel. Samentha will continue meal planning. She will have no sugary drinks.   Exercise goals: Anna Jackson Jackson has difficulty bending and other activities.   Behavioral modification strategies: increasing lean protein intake, decreasing simple carbohydrates, increasing vegetables, increasing water intake,  decreasing eating out, no skipping meals, meal planning and cooking strategies, keeping healthy foods in the home, and planning for success.  She was informed of the importance of frequent follow-up visits to maximize her success with intensive lifestyle modifications for her multiple health conditions. She was informed we would discuss her lab results at her next visit unless there is a critical issue that needs to be addressed sooner. Anna Jackson Jackson agreed to keep her next visit at the agreed upon time to discuss these  results.  Objective:   Blood pressure 134/85, pulse 70, temperature 98 F (36.7 C), height 5\' 6"  (1.676 m), weight 218 lb (98.9 kg), SpO2 97 %. Body mass index is 35.19 kg/m.  EKG: Normal sinus rhythm, rate 69 bpm  Indirect Calorimeter completed today shows a VO2 of 239 and a REE of 1642.  Her calculated basal metabolic rate is 7902 thus her basal metabolic rate is better than expected.  General: Cooperative, alert, well developed, in no acute distress. HEENT: Conjunctivae and lids unremarkable. Cardiovascular: Regular rhythm.  Lungs: Normal work of breathing. Neurologic: No focal deficits.   Lab Results  Component Value Date   CREATININE 0.74 02/20/2021   BUN 13 02/20/2021   NA 141 02/20/2021   K 3.4 (L) 02/20/2021   CL 102 02/20/2021   CO2 30 02/20/2021   Lab Results  Component Value Date   ALT 12 02/20/2021   AST 16 02/20/2021   ALKPHOS 122 (H) 12/09/2020   BILITOT 1.1 02/20/2021   Lab Results  Component Value Date   HGBA1C 5.4 02/20/2021   No results found for: INSULIN Lab Results  Component Value Date   TSH 0.87 02/20/2021   Lab Results  Component Value Date   CHOL 137 02/20/2021   HDL 77 02/20/2021   LDLCALC 44 02/20/2021   LDLDIRECT 105.0 11/23/2010   TRIG 80 02/20/2021   CHOLHDL 1.8 02/20/2021   Lab Results  Component Value Date   WBC 5.9 02/20/2021   HGB 12.6 02/20/2021   HCT 37.7 02/20/2021   MCV 86.5 02/20/2021   PLT 285 02/20/2021   Lab Results  Component Value Date   IRON 79 02/20/2021   TIBC 330 02/20/2021   FERRITIN 53 02/20/2021   Attestation Statements:   Reviewed by clinician on day of visit: allergies, medications, problem list, medical history, surgical history, family history, social history, and previous encounter notes.  I, Lizbeth Bark, RMA, am acting as Location manager for [employer], DO.  I have reviewed the above documentation for accuracy and completeness, and I agree with the above. Jearld Lesch, DO

## 2021-03-19 ENCOUNTER — Encounter (INDEPENDENT_AMBULATORY_CARE_PROVIDER_SITE_OTHER): Payer: Self-pay | Admitting: Bariatrics

## 2021-03-19 LAB — COMPREHENSIVE METABOLIC PANEL
ALT: 17 IU/L (ref 0–32)
AST: 22 IU/L (ref 0–40)
Albumin/Globulin Ratio: 2 (ref 1.2–2.2)
Albumin: 4.5 g/dL (ref 3.8–4.9)
Alkaline Phosphatase: 165 IU/L — ABNORMAL HIGH (ref 44–121)
BUN/Creatinine Ratio: 22 (ref 9–23)
BUN: 15 mg/dL (ref 6–24)
Bilirubin Total: 0.8 mg/dL (ref 0.0–1.2)
CO2: 28 mmol/L (ref 20–29)
Calcium: 9.3 mg/dL (ref 8.7–10.2)
Chloride: 102 mmol/L (ref 96–106)
Creatinine, Ser: 0.69 mg/dL (ref 0.57–1.00)
Globulin, Total: 2.3 g/dL (ref 1.5–4.5)
Glucose: 86 mg/dL (ref 70–99)
Potassium: 3.7 mmol/L (ref 3.5–5.2)
Sodium: 145 mmol/L — ABNORMAL HIGH (ref 134–144)
Total Protein: 6.8 g/dL (ref 6.0–8.5)
eGFR: 101 mL/min/{1.73_m2} (ref >59)

## 2021-03-19 LAB — INSULIN, RANDOM: INSULIN: 6.4 u[IU]/mL (ref 2.6–24.9)

## 2021-04-01 ENCOUNTER — Encounter (INDEPENDENT_AMBULATORY_CARE_PROVIDER_SITE_OTHER): Payer: Self-pay | Admitting: Bariatrics

## 2021-04-01 ENCOUNTER — Ambulatory Visit (INDEPENDENT_AMBULATORY_CARE_PROVIDER_SITE_OTHER): Payer: 59 | Admitting: Bariatrics

## 2021-04-01 ENCOUNTER — Other Ambulatory Visit: Payer: Self-pay

## 2021-04-01 VITALS — BP 132/86 | HR 62 | Temp 97.6°F | Ht 66.0 in | Wt 214.0 lb

## 2021-04-01 DIAGNOSIS — E669 Obesity, unspecified: Secondary | ICD-10-CM

## 2021-04-01 DIAGNOSIS — Z6834 Body mass index (BMI) 34.0-34.9, adult: Secondary | ICD-10-CM

## 2021-04-01 DIAGNOSIS — E785 Hyperlipidemia, unspecified: Secondary | ICD-10-CM

## 2021-04-01 DIAGNOSIS — E162 Hypoglycemia, unspecified: Secondary | ICD-10-CM

## 2021-04-01 DIAGNOSIS — I10 Essential (primary) hypertension: Secondary | ICD-10-CM | POA: Diagnosis not present

## 2021-04-01 DIAGNOSIS — F509 Eating disorder, unspecified: Secondary | ICD-10-CM

## 2021-04-01 MED ORDER — BUPROPION HCL ER (SR) 150 MG PO TB12: 150.0000 mg | ORAL_TABLET | Freq: Every day | ORAL | 0 refills | Status: DC

## 2021-04-02 ENCOUNTER — Encounter (INDEPENDENT_AMBULATORY_CARE_PROVIDER_SITE_OTHER): Payer: Self-pay | Admitting: Bariatrics

## 2021-04-02 NOTE — Progress Notes (Signed)
Chief Complaint:   OBESITY Anna Jackson is here to discuss her progress with her obesity treatment plan along with follow-up of her obesity related diagnoses. [name] is on the Category 2 Plan and states she is following her eating plan approximately 80% of the time. [name] states she is walking for 20 minutes 2 times per week.  Today's visit was #: 2 Starting weight: 218 lbs Starting date: 03/18/2021 Today's weight: 214 lbs Today's date: 04/01/2021 Total lbs lost to date: 4 lbs Total lbs lost since last in-office visit: 4 lbs  Interim History: [name] is down 4 lbs since her last visit. She is having difficulty finding snacks. She stopped her sweet tea. And she states that her hypoglycemic episodes are less frequent.   Subjective:   1. Dyslipidemia [name] is currently taking Lipitor.   2. Essential hypertension [name] is taking HCTZ currently.  3. Hypoglycemia [name] notes less with eating more regularly.  4. Eating disorder, unspecified type [name] notes cravings after dinner.  Assessment/Plan:   1. Dyslipidemia Cardiovascular risk and specific lipid/LDL goals reviewed.  [name] will limit saturated fats to dark chocolate and unprocessed meats. She will have no trans fats. We discussed several lifestyle modifications today and [name] will continue to work on diet, exercise and weight loss efforts. Orders and follow up as documented in patient record.   Counseling Intensive lifestyle modifications are the first line treatment for this issue. Dietary changes: Increase soluble fiber. Decrease simple carbohydrates. Exercise changes: Moderate to vigorous-intensity aerobic activity 150 minutes per week if tolerated. Lipid-lowering medications: see documented in medical record.  2. Essential hypertension [name] will continue medications. She will have no salt added. She is working on healthy weight loss and exercise to improve blood pressure control. We will watch for signs of hypotension as she  continues her lifestyle modifications.  3. Hypoglycemia [name] will eat more consistently.   4. Eating disorder, unspecified type We will refill Wellbutrin 150 mg for 1 month with no refills. Behavior modification techniques were discussed today to help [name] deal with her emotional/non-hunger eating behaviors.  Orders and follow up as documented in patient record.   - buPROPion (WELLBUTRIN SR) 150 MG 12 hr tablet; Take 1 tablet (150 mg total) by mouth daily.  Dispense: 30 tablet; Refill: 0  5. Obesity, current BMI 34.6 [name] is currently in the action stage of change. As such, her goal is to continue with weight loss efforts. She has agreed to the Category 2 Plan.   [name] will continue to adhere closely to the plan. She will continue meal planning. We reviewed labs from 03/18/2021 CMP, glucose and insulin. Handout "On the Road" was provided today.  Exercise goals:  As is.  Behavioral modification strategies: increasing lean protein intake, decreasing simple carbohydrates, increasing vegetables, increasing water intake, decreasing eating out, no skipping meals, meal planning and cooking strategies, keeping healthy foods in the home, and planning for success.  [name] has agreed to follow-up with our clinic in 2 weeks. She was informed of the importance of frequent follow-up visits to maximize her success with intensive lifestyle modifications for her multiple health conditions.   Objective:   Blood pressure 132/86, pulse 62, temperature 97.6 F (36.4 C), height 5\' 6"  (1.676 m), weight 214 lb (97.1 kg), SpO2 98 %. Body mass index is 34.54 kg/m.  General: Cooperative, alert, well developed, in no acute distress. HEENT: Conjunctivae and lids unremarkable. Cardiovascular: Regular rhythm.  Lungs: Normal work of breathing. Neurologic: No focal deficits.  Lab Results  Component Value Date   CREATININE 0.69 03/18/2021   BUN 15 03/18/2021   NA 145 (H) 03/18/2021   K 3.7 03/18/2021   CL 102  03/18/2021   CO2 28 03/18/2021   Lab Results  Component Value Date   ALT 17 03/18/2021   AST 22 03/18/2021   ALKPHOS 165 (H) 03/18/2021   BILITOT 0.8 03/18/2021   Lab Results  Component Value Date   HGBA1C 5.4 02/20/2021   Lab Results  Component Value Date   INSULIN 6.4 03/18/2021   Lab Results  Component Value Date   TSH 0.87 02/20/2021   Lab Results  Component Value Date   CHOL 137 02/20/2021   HDL 77 02/20/2021   LDLCALC 44 02/20/2021   LDLDIRECT 105.0 11/23/2010   TRIG 80 02/20/2021   CHOLHDL 1.8 02/20/2021   Lab Results  Component Value Date   VD25OH 67 02/20/2021   VD25OH 22.00 (L) 12/31/2019   VD25OH 28 (L) 12/22/2009   Lab Results  Component Value Date   WBC 5.9 02/20/2021   HGB 12.6 02/20/2021   HCT 37.7 02/20/2021   MCV 86.5 02/20/2021   PLT 285 02/20/2021   Lab Results  Component Value Date   IRON 79 02/20/2021   TIBC 330 02/20/2021   FERRITIN 53 02/20/2021    Attestation Statements:   Reviewed by clinician on day of visit: allergies, medications, problem list, medical history, surgical history, family history, social history, and previous encounter notes.  I, Lizbeth Bark, RMA, am acting as Location manager for [employer], DO.  I have reviewed the above documentation for accuracy and completeness, and I agree with the above. Anna Lesch, DO

## 2021-04-06 ENCOUNTER — Encounter (INDEPENDENT_AMBULATORY_CARE_PROVIDER_SITE_OTHER): Payer: Self-pay | Admitting: Bariatrics

## 2021-04-06 NOTE — Telephone Encounter (Signed)
Last OV with Dr Brown 

## 2021-04-07 NOTE — Telephone Encounter (Signed)
Please review

## 2021-04-27 ENCOUNTER — Ambulatory Visit (INDEPENDENT_AMBULATORY_CARE_PROVIDER_SITE_OTHER): Payer: 59 | Admitting: Bariatrics

## 2021-04-29 ENCOUNTER — Ambulatory Visit (INDEPENDENT_AMBULATORY_CARE_PROVIDER_SITE_OTHER): Payer: 59 | Admitting: Bariatrics

## 2021-04-30 ENCOUNTER — Ambulatory Visit (INDEPENDENT_AMBULATORY_CARE_PROVIDER_SITE_OTHER): Payer: 59 | Admitting: Bariatrics

## 2021-04-30 ENCOUNTER — Other Ambulatory Visit: Payer: Self-pay

## 2021-04-30 ENCOUNTER — Encounter (INDEPENDENT_AMBULATORY_CARE_PROVIDER_SITE_OTHER): Payer: Self-pay | Admitting: Bariatrics

## 2021-04-30 VITALS — BP 122/82 | HR 83 | Temp 97.9°F | Ht 66.0 in | Wt 210.0 lb

## 2021-04-30 DIAGNOSIS — E669 Obesity, unspecified: Secondary | ICD-10-CM

## 2021-04-30 DIAGNOSIS — F5089 Other specified eating disorder: Secondary | ICD-10-CM

## 2021-04-30 DIAGNOSIS — Z6833 Body mass index (BMI) 33.0-33.9, adult: Secondary | ICD-10-CM

## 2021-04-30 DIAGNOSIS — I10 Essential (primary) hypertension: Secondary | ICD-10-CM

## 2021-04-30 DIAGNOSIS — E785 Hyperlipidemia, unspecified: Secondary | ICD-10-CM

## 2021-04-30 MED ORDER — TOPIRAMATE 50 MG PO TABS: 50.0000 mg | ORAL_TABLET | Freq: Every day | ORAL | 0 refills | Status: DC

## 2021-05-05 NOTE — Progress Notes (Signed)
? ? ? ?Chief Complaint:  ? ?OBESITY ?Anna Jackson is here to discuss her progress with her obesity treatment plan along with follow-up of her obesity related diagnoses. Anna Jackson is on the Category 2 Plan and states she is following her eating plan approximately 75% of the time. Anna Jackson states she is walking for 3 miles 3 times per week. ? ?Today's visit was #: 3 ?Starting weight: 218 lbs ?Starting date: 03/18/2021 ?Today's weight: 210 lbs ?Today's date: 04/30/2021 ?Total lbs lost to date: 8 lbs ?Total lbs lost since last in-office visit: 4 lbs ? ?Interim History: Anna Jackson is down 4 lbs since her last visit. She has struggled with being very busy. She is drinking more water and eating more protein.  ? ?Subjective:  ? ?1. Essential hypertension ?Anna Jackson is currently taking HCTZ. Her blood pressure is controlled. Her last blood pressure was 132/86. ? ?2. Dyslipidemia ?Anna Jackson is taking Lipitor currently. She denies myalgias.  ? ?3. Other disorder of eating ?Anna Jackson could not tolerate Wellbutrin.  ? ?Assessment/Plan:  ? ?1. Essential hypertension ?Anna Jackson will continue taking HCTZ. She is working on healthy weight loss and exercise to improve blood pressure control. We will watch for signs of hypotension as she continues her lifestyle modifications. ? ?2. Dyslipidemia ?Anna Jackson will continue taking Lipitor. She will continue to work on weight loss, exercise, and decreasing simple carbohydrates to help decrease the risk of diabetes.  ? ?3. Other disorder of eating ?We will refill Topamax 50 mg for 1 month with no refills. Behavior modification techniques were discussed today to help Anna Jackson deal with her emotional/non-hunger eating behaviors.  Orders and follow up as documented in patient record.    ? ?- topiramate (TOPAMAX) 50 MG tablet; Take 1 tablet (50 mg total) by mouth daily with supper.  Dispense: 30 tablet; Refill: 0 ? ?4. Obesity, current BMI 33.9 ?Anna Jackson is currently in the action stage of change. As such, her goal is to continue with weight loss  efforts. She has agreed to the Category 2 Plan.  ? ?Anna Jackson will be mindful eating. She will continue meal planning.  ? ?Exercise goals:  As is. ? ?Behavioral modification strategies: increasing lean protein intake, decreasing simple carbohydrates, increasing vegetables, increasing water intake, decreasing eating out, no skipping meals, meal planning and cooking strategies, keeping healthy foods in the home, and planning for success. ? ?Anna Jackson has agreed to follow-up with our clinic in 2 weeks. She was informed of the importance of frequent follow-up visits to maximize her success with intensive lifestyle modifications for her multiple health conditions.  ? ?Objective:  ? ?Blood pressure 122/82, pulse 83, temperature 97.9 ?F (36.6 ?C), height '5\' 6"'$  (1.676 m), weight 210 lb (95.3 kg), SpO2 98 %. ?Body mass index is 33.89 kg/m?. ? ?General: Cooperative, alert, well developed, in no acute distress. ?HEENT: Conjunctivae and lids unremarkable. ?Cardiovascular: Regular rhythm.  ?Lungs: Normal work of breathing. ?Neurologic: No focal deficits.  ? ?Lab Results  ?Component Value Date  ? CREATININE 0.69 03/18/2021  ? BUN 15 03/18/2021  ? NA 145 (H) 03/18/2021  ? K 3.7 03/18/2021  ? CL 102 03/18/2021  ? CO2 28 03/18/2021  ? ?Lab Results  ?Component Value Date  ? ALT 17 03/18/2021  ? AST 22 03/18/2021  ? ALKPHOS 165 (H) 03/18/2021  ? BILITOT 0.8 03/18/2021  ? ?Lab Results  ?Component Value Date  ? HGBA1C 5.4 02/20/2021  ? ?Lab Results  ?Component Value Date  ? INSULIN 6.4 03/18/2021  ? ?Lab Results  ?Component Value Date  ?  TSH 0.87 02/20/2021  ? ?Lab Results  ?Component Value Date  ? CHOL 137 02/20/2021  ? HDL 77 02/20/2021  ? LDLCALC 44 02/20/2021  ? LDLDIRECT 105.0 11/23/2010  ? TRIG 80 02/20/2021  ? CHOLHDL 1.8 02/20/2021  ? ?Lab Results  ?Component Value Date  ? VD25OH 67 02/20/2021  ? VD25OH 22.00 (L) 12/31/2019  ? VD25OH 28 (L) 12/22/2009  ? ?Lab Results  ?Component Value Date  ? WBC 5.9 02/20/2021  ? HGB 12.6 02/20/2021  ?  HCT 37.7 02/20/2021  ? MCV 86.5 02/20/2021  ? PLT 285 02/20/2021  ? ?Lab Results  ?Component Value Date  ? IRON 79 02/20/2021  ? TIBC 330 02/20/2021  ? FERRITIN 53 02/20/2021  ? ?Attestation Statements:  ? ?Reviewed by clinician on day of visit: allergies, medications, problem list, medical history, surgical history, family history, social history, and previous encounter notes. ? ?I, Lizbeth Bark, RMA, am acting as transcriptionist for [employer], DO. ? ?I have reviewed the above documentation for accuracy and completeness, and I agree with the above. Jearld Lesch, DO ? ?

## 2021-05-27 ENCOUNTER — Ambulatory Visit (INDEPENDENT_AMBULATORY_CARE_PROVIDER_SITE_OTHER): Payer: 59 | Admitting: Bariatrics

## 2021-06-07 ENCOUNTER — Other Ambulatory Visit: Payer: Self-pay | Admitting: Family Medicine

## 2021-06-07 DIAGNOSIS — I10 Essential (primary) hypertension: Secondary | ICD-10-CM

## 2021-06-10 ENCOUNTER — Encounter (INDEPENDENT_AMBULATORY_CARE_PROVIDER_SITE_OTHER): Payer: Self-pay | Admitting: Family Medicine

## 2021-06-10 ENCOUNTER — Ambulatory Visit (INDEPENDENT_AMBULATORY_CARE_PROVIDER_SITE_OTHER): Payer: 59 | Admitting: Family Medicine

## 2021-06-10 VITALS — BP 127/75 | HR 66 | Temp 97.8°F | Ht 66.0 in | Wt 210.0 lb

## 2021-06-10 DIAGNOSIS — R5383 Other fatigue: Secondary | ICD-10-CM

## 2021-06-10 DIAGNOSIS — Z6833 Body mass index (BMI) 33.0-33.9, adult: Secondary | ICD-10-CM | POA: Diagnosis not present

## 2021-06-10 DIAGNOSIS — E669 Obesity, unspecified: Secondary | ICD-10-CM | POA: Diagnosis not present

## 2021-06-10 DIAGNOSIS — F3289 Other specified depressive episodes: Secondary | ICD-10-CM

## 2021-06-10 DIAGNOSIS — R0602 Shortness of breath: Secondary | ICD-10-CM

## 2021-06-10 MED ORDER — TOPIRAMATE 50 MG PO TABS: 50.0000 mg | ORAL_TABLET | Freq: Every evening | ORAL | 0 refills | Status: DC

## 2021-06-23 NOTE — Progress Notes (Signed)
?TeleHealth Visit:  ?This visit was completed with telemedicine (audio/video) technology. ?Anna Jackson has verbally consented to this TeleHealth visit. The patient is located at home, the provider is located at home. The participants in this visit include the listed provider and patient. The visit was conducted today via MyChart video. ? ?OBESITY ?Anna Jackson is here to discuss her progress with her obesity treatment plan along with follow-up of her obesity related diagnoses.  ? ?Today's visit was # 5 ?Starting weight: 218 lbs ?Starting date: 03/18/2021 ?Weight at last in office visit: 210 lbs on 06/10/21 ?Total weight loss: 8 lbs at last in office visit on 06/10/21. ?Today's reported weight: No weight reported. ? ?Nutrition Plan: the Category 2 Plan.  ?Hunger is well controlled. Cravings are well controlled.  ? ?Interim History: Anna Jackson notes that lack of appetite is an issue and she is often skipping lunch despite packing it for work.  Protein intake is inadequate.  She is not measuring her amount of protein for meals.  ?Her boyfriend is also a patient. ?She would like to make a homemade protein shake for breakfast. ?They do eat out some.  They had [place] food for dinner last night.  Sometimes they have a salad when they eat out but they use ranch or thousand [employer] dressing. ?She drinks sweet tea. ? ?Assessment/Plan:  ?1.  Other depression with emotional eating ?She reports she is not having any cravings currently.  She is prescribed 50 mg of topiramate nightly but is only taking 25 mg because she felt fatigue with 50 mg. ? ?Plan: ?Continue topiramate at 25 mg nightly. ? ?2. Vitamin D Deficiency ?Vitamin D is at goal of 50. She is on weekly prescription Vitamin D 50,000 IU.  ?Lab Results  ?Component Value Date  ? VD25OH 67 02/20/2021  ? VD25OH 22.00 (L) 12/31/2019  ? VD25OH 28 (L) 12/22/2009  ? ?Plan: ?Refill prescription vitamin D 50,000 IU weekly. ? ?Obesity: Current BMI 33.9 ?Anna Jackson is currently in the action stage of  change. As such, her goal is to continue with weight loss efforts.  ?She has agreed to the Category 2 Plan.  ? ?Behavioral modification strategies: increasing lean protein intake, decreasing simple carbohydrates, decreasing liquid calories, and decreasing eating out. ?She will try unsweet tea with artificial sweetener. ?Recipe for homemade smoothies, dining out handout, suggestions for low calorie wraps and salad dressings sent via MyChart ? ?Anna Jackson has agreed to follow-up with our clinic in 2 weeks.  ? ?No orders of the defined types were placed in this encounter. ? ? ?Medications Discontinued During This Encounter  ?Medication Reason  ? Vitamin D, Ergocalciferol, (DRISDOL) 1.25 MG (50000 UNIT) CAPS capsule Reorder  ?  ? ?Meds ordered this encounter  ?Medications  ? Vitamin D, Ergocalciferol, (DRISDOL) 1.25 MG (50000 UNIT) CAPS capsule  ?  Sig: Take 1 capsule (50,000 Units total) by mouth once a week.  ?  Dispense:  12 capsule  ?  Refill:  0  ?  Order Specific Question:   Supervising Provider  ?  Answer:   Dennard Nip D [[id number]]  ?   ? ?Objective:  ? ?VITALS: Per patient if applicable, see vitals. ?GENERAL: Alert and in no acute distress. ?CARDIOPULMONARY: No increased WOB. Speaking in clear sentences.  ?PSYCH: Pleasant and cooperative. Speech normal rate and rhythm. Affect is appropriate. Insight and judgement are appropriate. Attention is focused, linear, and appropriate.  ?NEURO: Oriented as arrived to appointment on time with no prompting.  ? ?Lab Results  ?Component Value Date  ?  CREATININE 0.69 03/18/2021  ? BUN 15 03/18/2021  ? NA 145 (H) 03/18/2021  ? K 3.7 03/18/2021  ? CL 102 03/18/2021  ? CO2 28 03/18/2021  ? ?Lab Results  ?Component Value Date  ? ALT 17 03/18/2021  ? AST 22 03/18/2021  ? ALKPHOS 165 (H) 03/18/2021  ? BILITOT 0.8 03/18/2021  ? ?Lab Results  ?Component Value Date  ? HGBA1C 5.4 02/20/2021  ? ?Lab Results  ?Component Value Date  ? INSULIN 6.4 03/18/2021  ? ?Lab Results  ?Component Value  Date  ? TSH 0.87 02/20/2021  ? ?Lab Results  ?Component Value Date  ? CHOL 137 02/20/2021  ? HDL 77 02/20/2021  ? LDLCALC 44 02/20/2021  ? LDLDIRECT 105.0 11/23/2010  ? TRIG 80 02/20/2021  ? CHOLHDL 1.8 02/20/2021  ? ?Lab Results  ?Component Value Date  ? WBC 5.9 02/20/2021  ? HGB 12.6 02/20/2021  ? HCT 37.7 02/20/2021  ? MCV 86.5 02/20/2021  ? PLT 285 02/20/2021  ? ?Lab Results  ?Component Value Date  ? IRON 79 02/20/2021  ? TIBC 330 02/20/2021  ? FERRITIN 53 02/20/2021  ? ?Lab Results  ?Component Value Date  ? VD25OH 67 02/20/2021  ? VD25OH 22.00 (L) 12/31/2019  ? VD25OH 28 (L) 12/22/2009  ? ? ?Attestation Statements:  ? ?Reviewed by clinician on day of visit: allergies, medications, problem list, medical history, surgical history, family history, social history, and previous encounter notes. ? ? ?

## 2021-06-24 ENCOUNTER — Encounter (INDEPENDENT_AMBULATORY_CARE_PROVIDER_SITE_OTHER): Payer: Self-pay | Admitting: Family Medicine

## 2021-06-24 ENCOUNTER — Encounter (INDEPENDENT_AMBULATORY_CARE_PROVIDER_SITE_OTHER): Payer: Self-pay

## 2021-06-24 ENCOUNTER — Telehealth (INDEPENDENT_AMBULATORY_CARE_PROVIDER_SITE_OTHER): Payer: 59 | Admitting: Family Medicine

## 2021-06-24 DIAGNOSIS — F3289 Other specified depressive episodes: Secondary | ICD-10-CM | POA: Diagnosis not present

## 2021-06-24 DIAGNOSIS — Z6833 Body mass index (BMI) 33.0-33.9, adult: Secondary | ICD-10-CM | POA: Diagnosis not present

## 2021-06-24 DIAGNOSIS — E559 Vitamin D deficiency, unspecified: Secondary | ICD-10-CM

## 2021-06-24 DIAGNOSIS — E669 Obesity, unspecified: Secondary | ICD-10-CM | POA: Diagnosis not present

## 2021-06-24 MED ORDER — VITAMIN D (ERGOCALCIFEROL) 1.25 MG (50000 UNIT) PO CAPS: 50000.0000 [IU] | ORAL_CAPSULE | ORAL | 0 refills | Status: DC

## 2021-06-24 NOTE — Progress Notes (Signed)
Chief Complaint:   OBESITY Anna Jackson is here to discuss her progress with her obesity treatment plan along with follow-up of her obesity related diagnoses. Anna Jackson is on the Category 2 Plan and states she is following her eating plan approximately 30% of the time. Anna Jackson states she is doing 0 minutes 0 times per week.  Today's visit was #: 4 Starting weight: 218 lbs Starting date: 03/18/2021 Today's weight: 210 lbs Today's date: 06/10/2021 Total lbs lost to date: 8 Total lbs lost since last in-office visit: 0  Interim History: Anna Jackson feels she is struggling with her meal plan. She is not feeling as satisfied and would really like more breakfast options.   Subjective:   1. Other depression, with emotional eating. Anna Jackson was prescribed Topamax at her last visit but she was hesitant to take it based on side effects. She has questions about how it works and what to expect.   Assessment/Plan:   1. Other depression, with emotional eating. Patient was educated on low risk of side effects at such a low dose, and emotional eating behaviors was explained. Anna Jackson agreed to start Topamax 50 mg qhs with no refills (ok to take 1/2 if needed).  - topiramate (TOPAMAX) 50 MG tablet; Take 1 tablet (50 mg total) by mouth at bedtime.  Dispense: 30 tablet; Refill: 0  2. Obesity, current BMI 33.9 Anna Jackson is currently in the action stage of change. As such, her goal is to continue with weight loss efforts. She has agreed to the Category 2 Plan with breakfast options.   Real protein smoothie recipes were given.  Exercise goals: No exercise has been prescribed at this time.  Behavioral modification strategies: increasing lean protein intake and no skipping meals.  Anna Jackson has agreed to follow-up with our clinic in 2 weeks. She was informed of the importance of frequent follow-up visits to maximize her success with intensive lifestyle modifications for her multiple health conditions.   Objective:   Blood pressure  127/75, pulse 66, temperature 97.8 F (36.6 C), height '5\' 6"'$  (1.676 m), weight 210 lb (95.3 kg), SpO2 98 %. Body mass index is 33.89 kg/m.  General: Cooperative, alert, well developed, in no acute distress. HEENT: Conjunctivae and lids unremarkable. Cardiovascular: Regular rhythm.  Lungs: Normal work of breathing. Neurologic: No focal deficits.   Lab Results  Component Value Date   CREATININE 0.69 03/18/2021   BUN 15 03/18/2021   NA 145 (H) 03/18/2021   K 3.7 03/18/2021   CL 102 03/18/2021   CO2 28 03/18/2021   Lab Results  Component Value Date   ALT 17 03/18/2021   AST 22 03/18/2021   ALKPHOS 165 (H) 03/18/2021   BILITOT 0.8 03/18/2021   Lab Results  Component Value Date   HGBA1C 5.4 02/20/2021   Lab Results  Component Value Date   INSULIN 6.4 03/18/2021   Lab Results  Component Value Date   TSH 0.87 02/20/2021   Lab Results  Component Value Date   CHOL 137 02/20/2021   HDL 77 02/20/2021   LDLCALC 44 02/20/2021   LDLDIRECT 105.0 11/23/2010   TRIG 80 02/20/2021   CHOLHDL 1.8 02/20/2021   Lab Results  Component Value Date   VD25OH 67 02/20/2021   VD25OH 22.00 (L) 12/31/2019   VD25OH 28 (L) 12/22/2009   Lab Results  Component Value Date   WBC 5.9 02/20/2021   HGB 12.6 02/20/2021   HCT 37.7 02/20/2021   MCV 86.5 02/20/2021   PLT 285 02/20/2021  Lab Results  Component Value Date   IRON 79 02/20/2021   TIBC 330 02/20/2021   FERRITIN 53 02/20/2021   Attestation Statements:   Reviewed by clinician on day of visit: allergies, medications, problem list, medical history, surgical history, family history, social history, and previous encounter notes.  Time spent on visit including pre-visit chart review and post-visit care and charting was 35 minutes.    I, Trixie Dredge, am acting as transcriptionist for Dennard Nip, MD.  I have reviewed the above documentation for accuracy and completeness, and I agree with the above. -  Dennard Nip, MD

## 2021-07-01 ENCOUNTER — Encounter (HOSPITAL_BASED_OUTPATIENT_CLINIC_OR_DEPARTMENT_OTHER): Payer: Self-pay | Admitting: Emergency Medicine

## 2021-07-01 ENCOUNTER — Other Ambulatory Visit: Payer: Self-pay

## 2021-07-01 ENCOUNTER — Telehealth: Payer: Self-pay | Admitting: Family Medicine

## 2021-07-01 ENCOUNTER — Emergency Department (HOSPITAL_BASED_OUTPATIENT_CLINIC_OR_DEPARTMENT_OTHER): Payer: 59

## 2021-07-01 ENCOUNTER — Emergency Department (HOSPITAL_BASED_OUTPATIENT_CLINIC_OR_DEPARTMENT_OTHER)
Admission: EM | Admit: 2021-07-01 | Discharge: 2021-07-01 | Disposition: A | Discharge status: Home / Self Care | Payer: 59 | Attending: Emergency Medicine | Admitting: Emergency Medicine

## 2021-07-01 DIAGNOSIS — R079 Chest pain, unspecified: Secondary | ICD-10-CM | POA: Diagnosis present

## 2021-07-01 LAB — BASIC METABOLIC PANEL
Anion gap: 5 (ref 5–15)
BUN: 10 mg/dL (ref 6–20)
CO2: 28 mmol/L (ref 22–32)
Calcium: 8.4 mg/dL — ABNORMAL LOW (ref 8.9–10.3)
Chloride: 105 mmol/L (ref 98–111)
Creatinine, Ser: 0.69 mg/dL (ref 0.44–1.00)
GFR, Estimated: >60 mL/min (ref >60)
Glucose, Bld: 91 mg/dL (ref 70–99)
Potassium: 3 mmol/L — ABNORMAL LOW (ref 3.5–5.1)
Sodium: 138 mmol/L (ref 135–145)

## 2021-07-01 LAB — CBC WITH DIFFERENTIAL/PLATELET
Abs Immature Granulocytes: 0.01 10*3/uL (ref 0.00–0.07)
Basophils Absolute: 0 10*3/uL (ref 0.0–0.1)
Basophils Relative: 1 %
Eosinophils Absolute: 0.1 10*3/uL (ref 0.0–0.5)
Eosinophils Relative: 2 %
HCT: 37.7 % (ref 36.0–46.0)
Hemoglobin: 12.4 g/dL (ref 12.0–15.0)
Immature Granulocytes: 0 %
Lymphocytes Relative: 33 %
Lymphs Abs: 1.7 10*3/uL (ref 0.7–4.0)
MCH: 29 pg (ref 26.0–34.0)
MCHC: 32.9 g/dL (ref 30.0–36.0)
MCV: 88.1 fL (ref 80.0–100.0)
Monocytes Absolute: 0.4 10*3/uL (ref 0.1–1.0)
Monocytes Relative: 8 %
Neutro Abs: 2.9 10*3/uL (ref 1.7–7.7)
Neutrophils Relative %: 56 %
Platelets: 286 10*3/uL (ref 150–400)
RBC: 4.28 MIL/uL (ref 3.87–5.11)
RDW: 13.6 % (ref 11.5–15.5)
WBC: 5.2 10*3/uL (ref 4.0–10.5)
nRBC: 0 % (ref 0.0–0.2)

## 2021-07-01 LAB — CBG MONITORING, ED: Glucose-Capillary: 97 mg/dL (ref 70–99)

## 2021-07-01 LAB — TROPONIN I (HIGH SENSITIVITY): Troponin I (High Sensitivity): 7 ng/L (ref <18)

## 2021-07-01 LAB — D-DIMER, QUANTITATIVE: D-Dimer, Quant: 0.31 ug/mL-FEU (ref 0.00–0.50)

## 2021-07-01 MED ORDER — ACETAMINOPHEN 325 MG PO TABS
650.0000 mg | ORAL_TABLET | Freq: Once | ORAL | Status: AC
  Administered 2021-07-01: 650 mg via ORAL
  Filled 2021-07-01: qty 2

## 2021-07-01 NOTE — ED Triage Notes (Signed)
Pt via pov from home with chest pain since yesterday that is somewhat better today. Pt reports that she called her pcp to make an appt and was told to come to ED. Pt also reports that she had some blurred vision and "seeing colors" last night for about 30 minutes, accompanied by sob. Blurred vision has resolved, states she is slightly sob today. Pt alert & oriented, nad noted.  ?

## 2021-07-01 NOTE — Telephone Encounter (Signed)
Spoke with patient. Pt states having chest pain yesterday with SOB, blurred vision, dizziness, and pt felt like someone was sitting on her chest. Pt states taking aspirin and nitroglycerin and states the nitro didn't help. Pt states she woke this morning and the chest pain was gone but was starting to return w/o the other sxs. Pt was advised to go to the ED downstairs. Pt states she will go.   ?

## 2021-07-01 NOTE — ED Provider Notes (Signed)
?[facility] ?Provider Note ? ? ?CSN: [id number] ?Arrival date & time: 07/01/21  1031 ? ?  ? ?History ? ?Chief Complaint  ?Patient presents with  ? Chest Pain  ? ? ?Anna Jackson is a 61 y.o. female. ? ?Patient here with chest pain.  Had some yesterday.  Not better on its own.  Tried to make an appointment with her primary care doctor but was told to come to the ED for evaluation.  Feels slightly short of breath still.  Denies any cough or fever or sputum production.  Nothing has made it worse or better.  History of high cholesterol, anxiety, acid reflux.  Has not had any exertional chest pain.  Has had cardiac work-up in the past.  Not sure if it felt like her reflux or not.  No history of blood clot.  No recent surgery or travel. ? ? ? ?  ? ?Home Medications ?Prior to Admission medications   ?Medication Sig Start Date End Date Taking? Authorizing Provider  ?alendronate (FOSAMAX) 70 MG tablet Take 1 tablet (70 mg total) by mouth every 7 (seven) days. Take with a full glass of water on an empty stomach. 12/09/20  Yes Roma Schanz R, DO  ?atorvastatin (LIPITOR) 20 MG tablet Take 1 tablet (20 mg total) by mouth daily. 12/09/20  Yes Roma Schanz R, DO  ?docusate sodium (COLACE) 100 MG capsule Take 300 mg by mouth in the morning.    Yes [provider]  ?FLUoxetine (PROZAC) 40 MG capsule Take 1 capsule (40 mg total) by mouth daily. 12/09/20  Yes Roma Schanz R, DO  ?hydrochlorothiazide (HYDRODIURIL) 25 MG tablet Take 1 tablet (25 mg total) by mouth daily. 12/09/20  Yes Roma Schanz R, DO  ?potassium chloride SA (KLOR-CON M) 20 MEQ tablet Take 1 tablet by mouth once daily 06/08/21  Yes Ann Held, DO  ?Vitamin D, Ergocalciferol, (DRISDOL) 1.25 MG (50000 UNIT) CAPS capsule Take 1 capsule (50,000 Units total) by mouth once a week. 06/24/21  Yes Whitmire, Joneen Boers, FNP  ?topiramate (TOPAMAX) 50 MG tablet Take 1 tablet (50 mg total) by mouth at  bedtime. 06/10/21   Starlyn Skeans, MD  ?   ? ?Allergies    ?Betadine [povidone iodine] and Codeine   ? ?Review of Systems   ?Review of Systems ? ?Physical Exam ?Updated Vital Signs ?BP 132/75   Pulse (!) 55   Temp 97.8 ?F (36.6 ?C) (Oral)   Resp 17   Ht '5\' 6"'$  (1.676 m)   Wt 95.3 kg   SpO2 100%   BMI 33.89 kg/m?  ?Physical Exam ?Vitals and nursing note reviewed.  ?Constitutional:   ?   General: She is not in acute distress. ?   Appearance: She is well-developed.  ?HENT:  ?   Head: Normocephalic and atraumatic.  ?Eyes:  ?   Conjunctiva/sclera: Conjunctivae normal.  ?Cardiovascular:  ?   Rate and Rhythm: Normal rate and regular rhythm.  ?   Pulses:     ?     Carotid pulses are 2+ on the right side and 2+ on the left side. ?   Heart sounds: No murmur heard. ?Pulmonary:  ?   Effort: Pulmonary effort is normal. No respiratory distress.  ?   Breath sounds: Normal breath sounds.  ?Abdominal:  ?   Palpations: Abdomen is soft.  ?   Tenderness: There is no abdominal tenderness.  ?Musculoskeletal:     ?  General: No swelling.  ?   Cervical back: Neck supple.  ?Skin: ?   General: Skin is warm and dry.  ?   Capillary Refill: Capillary refill takes less than 2 seconds.  ?Neurological:  ?   Mental Status: She is alert.  ?Psychiatric:     ?   Mood and Affect: Mood normal.  ? ? ?ED Results / Procedures / Treatments   ?Labs ?(all labs ordered are listed, but only abnormal results are displayed) ?Labs Reviewed  ?BASIC METABOLIC PANEL - Abnormal; Notable for the following components:  ?    Result Value  ? Potassium 3.0 (*)   ? Calcium 8.4 (*)   ? All other components within normal limits  ?CBC WITH DIFFERENTIAL/PLATELET  ?D-DIMER, QUANTITATIVE  ?CBG MONITORING, ED  ?TROPONIN I (HIGH SENSITIVITY)  ? ? ?EKG ?EKG Interpretation ? ?Date/Time:  Wednesday Jul 01 2021 11:24:17 EDT ?Ventricular Rate:  54 ?PR Interval:  165 ?QRS Duration: 108 ?QT Interval:  488 ?QTC Calculation: 463 ?R Axis:   3 ?Text Interpretation: Sinus rhythm  Confirmed by Lennice Sites ([id number]) on 07/01/2021 11:52:01 AM ? ?Radiology ?DG Chest Portable 1 View ? ?Result Date: 07/01/2021 ?CLINICAL DATA:  CP EXAM: PORTABLE CHEST 1 VIEW COMPARISON:  Chest radiograph dated December 25, 2019 FINDINGS: The cardiomediastinal silhouette is unchanged in contour.LEFT axillary clips. No pleural effusion. No pneumothorax. No acute pleuroparenchymal abnormality. Visualized abdomen is unremarkable. IMPRESSION: No acute cardiopulmonary abnormality. Electronically Signed   By: Valentino Saxon M.D.   On: 07/01/2021 10:57   ? ?Procedures ?Procedures  ? ? ?Medications Ordered in ED ?Medications  ?acetaminophen (TYLENOL) tablet 650 mg (650 mg Oral Given 07/01/21 1203)  ? ? ?ED Course/ Medical Decision Making/ A&P ?  ?                        ?Medical Decision Making ?Amount and/or Complexity of Data Reviewed ?Labs: ordered. ?Radiology: ordered. ? ?Risk ?OTC drugs. ? ? ?Anna Jackson is here with chest pain.  Normal vitals.  No fever.  Had chest pain since yesterday.  Nothing has made it worse or better.  Appears slightly reproducible on exam.  She had some shortness of breath, blurred vision at some point yesterday but that mostly has resolved.  Maybe feels a little bit short of breath still now.  No recent surgery or travel.  Per chart review she did have a CT coronary study done in 2021 that showed very mild disease.  2 areas of nonobstructive CAD.  EKG per my review and interpretation shows sinus rhythm.  No ischemic changes.  Story is overall atypical for ACS.  Does not feel like her reflux symptoms.  Differential diagnosis is ACS versus pneumonia versus PE versus MSK pain versus reflux. ? ?We will check CBC, troponin, chest x-ray, D-dimer, BMP.  Will give Tylenol.  Will get chest x-ray. ? ?Per my review and interpretation of labs and imaging there is no pneumonia or pneumothorax.  Troponin is normal.  D-dimer is normal.  No significant anemia, electrolyte abnormality, kidney injury,  leukocytosis otherwise.  Not in any pain.  Did offer her second troponin but she did not want to stay which I think is reasonable given she has not had any discomfort in her original discomfort started over 2 hours ago.  Also with reassuring CT coronary study 2 years ago.  She understands return precautions.  She will follow-up with her primary care doctor.  Discharged in good condition.  Recommend Tylenol ibuprofen and rest. ? ?This chart was dictated using voice recognition software.  Despite best efforts to proofread,  errors can occur which can change the documentation meaning.  ? ? ? ? ? ? ?Final Clinical Impression(s) / ED Diagnoses ?Final diagnoses:  ?Nonspecific chest pain  ? ? ?Rx / DC Orders ?ED Discharge Orders   ? ? None  ? ?  ? ? ?  ?Lennice Sites, DO ?07/01/21 1223 ? ?

## 2021-07-01 NOTE — Telephone Encounter (Signed)
Pt states she's having chest pain but it is not life threatening and would like to sched. Pt declined speaking with triage  ?

## 2021-07-01 NOTE — Discharge Instructions (Signed)
Overall work-up is unremarkable as discussed.  Continue Tylenol, ibuprofen, rest.  Follow-up with cardiology and primary care doctor. ?

## 2021-07-02 ENCOUNTER — Encounter: Payer: Self-pay | Admitting: Family Medicine

## 2021-07-02 ENCOUNTER — Ambulatory Visit: Payer: 59 | Admitting: Family Medicine

## 2021-07-02 VITALS — BP 124/86 | HR 64 | Temp 98.1°F | Resp 18 | Ht 66.0 in | Wt 213.6 lb

## 2021-07-02 DIAGNOSIS — R079 Chest pain, unspecified: Secondary | ICD-10-CM

## 2021-07-02 NOTE — Assessment & Plan Note (Signed)
Er visit reviewed  Labs and ekg normal Pain has resolved--- tylenol and rest helped  F/u prn

## 2021-07-02 NOTE — Patient Instructions (Signed)
Chest Wall Pain Chest wall pain is pain in or around the bones and muscles of your chest. Sometimes, an injury causes this pain. Excessive coughing or overuse of arm and chest muscles may also cause chest wall pain. Sometimes, the cause may not be known. This pain may take several weeks or longer to get better. Follow these instructions at home: Managing pain, stiffness, and swelling  If directed, put ice on the painful area: Put ice in a plastic bag. Place a towel between your skin and the bag. Leave the ice on for 20 minutes, 2-3 times per day. Activity Rest as told by your health care provider. Avoid activities that cause pain. These include any activities that use your chest muscles or your abdominal and side muscles to lift heavy items. Ask your health care provider what activities are safe for you. General instructions  Take over-the-counter and prescription medicines only as told by your health care provider. Do not use any products that contain nicotine or tobacco, such as cigarettes, e-cigarettes, and chewing tobacco. These can delay healing after injury. If you need help quitting, ask your health care provider. Keep all follow-up visits as told by your health care provider. This is important. Contact a health care provider if: You have a fever. Your chest pain becomes worse. You have new symptoms. Get help right away if: You have nausea or vomiting. You feel sweaty or light-headed. You have a cough with mucus from your lungs (sputum) or you cough up blood. You develop shortness of breath. These symptoms may represent a serious problem that is an emergency. Do not wait to see if the symptoms will go away. Get medical help right away. Call your local emergency services (911 in the [place] Do not drive yourself to the hospital. Summary Chest wall pain is pain in or around the bones and muscles of your chest. Depending on the cause, it may be treated with ice, rest, medicines, and  avoiding activities that cause pain. Contact a health care provider if you have a fever, worsening chest pain, or new symptoms. Get help right away if you feel light-headed or you develop shortness of breath. These symptoms may be an emergency. This information is not intended to replace advice given to you by your health care provider. Make sure you discuss any questions you have with your health care provider. Document Revised: 04/18/2020 Document Reviewed: 04/18/2020 Elsevier Patient Education  2023 [employer]  

## 2021-07-02 NOTE — Progress Notes (Signed)
Subjective:   By signing my name below, I, Anna Jackson, attest that this documentation has been prepared under the direction and in the presence of Ann Held, DO  07/02/2021    Patient ID: Anna Jackson, female    DOB: [birth year], 61 y.o.   MRN: [id number]  Chief Complaint  Patient presents with   Chest pain    Pt states no chest pain today and no other sxs   Follow-up    HPI Patient is in today for a follow up visit.   Yesterday she was experiencing chest pain and seen an ED. While there she found no new issues. She describes her chest pain like she had a weight pressing down on her. She does not think its reflux pain. She continued having chest pain after leaving the ED. She has no pain at this time.  She denies lifting any heavy objects recently.   She continues seeing a healthy weight and wellness program. She recently started struggling losing weight. She reports being prescribed Zoloft to help manage her weight loss. She currently weighs 213 lb's and BMI of 34.48 kb/m^2.  Wt Readings from Last 3 Encounters:  07/02/21 213 lb 9.6 oz (96.9 kg)  07/01/21 210 lb (95.3 kg)  06/10/21 210 lb (95.3 kg)   She does not have an upcomming physical exam scheduled and is interested in scheduling one.    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   Mental disorder    Neuropathic pain 04/04/2014   Non Hodgkin's lymphoma ([place]) 2015   Skin infection 11/02/2013   Unspecified asthma(493.90)    Unspecified essential hypertension    URI  (upper respiratory infection) 01/15/2014   Vitamin D deficiency     Past Surgical History:  Procedure Laterality Date   AXILLARY LYMPH NODE DISSECTION Left 03/09/2013   Procedure: EXCISION LEFT AXILLARY LYMPH NODES;  Surgeon: [name], MD;  Location: [facility];  Service: General;  Laterality: Left;   BREAST BIOPSY Left 02/13/2013   high risk   BREAST EXCISIONAL BIOPSY Left 03/09/2013   high risk   CERVICAL CONIZATION W/BX     CHOLECYSTECTOMY  1995   Open, done with Hiatal hernia repair   ESOPHAGEAL MANOMETRY N/A 01/12/2017   Procedure: ESOPHAGEAL MANOMETRY (EM);  Surgeon: Doran Stabler, MD;  Location: [facility];  Service: Gastroenterology;  Laterality: N/A;   INCISIONAL HERNIA REPAIR  2004   Dr Nedra Hai.  Parietex 20x15cm mesh   KNEE SURGERY Right 02/2010   Dr Shellia Carwin, Arthroscopic   NISSEN FUNDOPLICATION  0923   OPEN, Dr Lennie Hummer   ORIF WRIST FRACTURE Right 12/25/2019   Procedure: #1 right wrist/radius open reduction internal fixation comminuted complex distal radius fracture with DVR Biomet cross lock plate and screw construct.  This was a greater than 3 part intra-articular fracture.  #2 AP lateral and oblique x-rays performed examined and interpreted by myself #3 evaluation  anesthesia ulnar styloid fracture with subsequent closed treatment based upon stability    PORTACATH PLACEMENT N/A 03/23/2013   Procedure: INSERTION PORT-A-CATH;  Surgeon: [name], MD;  Location: [facility] OR;  Service: General;  Laterality: N/A;   TUBAL LIGATION     VAGINAL HYSTERECTOMY  2002   Dr Art Raphael Gibney    Family History  Problem Relation Age of Onset   Heart disease Mother    High blood pressure Mother    High Cholesterol Mother    Anxiety disorder Mother    Heart disease Father    High Cholesterol Father    High blood pressure Father    Heart disease Brother    Alcoholism Brother    Breast cancer Maternal Grandmother 1   Cancer Maternal Grandmother 31       breast    Diabetes Cousin    Coronary artery disease Other        with hernia repair   Migraines Other    Hypertension Other    Colon cancer Neg Hx     Social History   Socioeconomic History   Marital status: Significant Other    Spouse name: Not on file   Number of children: 1   Years of education: Not on file   Highest education level: Not on file  Occupational History   Occupation: RECEIVING CLERK    Employer: [employer]   Occupation: Animal nutritionist  Tobacco Use   Smoking status: Never   Smokeless tobacco: Never  Vaping Use   Vaping Use: Never used  Substance and Sexual Activity   Alcohol use: Yes    Comment: occasional   Drug use: No   Sexual activity: Not Currently    Partners: Male  Other Topics Concern   Not on file  Social History Narrative   Exercise-- walking   Social Determinants of Health   Financial Resource Strain: Not on file  Food Insecurity: Not on file  Transportation Needs: Not on file  Physical Activity: Not on file  Stress: Not on file  Social Connections: Not on file  Intimate Partner Violence: Not on file    Outpatient Medications Prior to Visit  Medication Sig Dispense Refill   alendronate (FOSAMAX) 70 MG tablet Take 1 tablet (70 mg total) by mouth every 7 (seven) days. Take with a full glass of water on an empty stomach. 12 tablet 3   atorvastatin (LIPITOR) 20 MG tablet Take 1 tablet (20 mg total) by mouth daily. 90 tablet 1   docusate sodium (COLACE) 100 MG capsule Take 300 mg by mouth in the morning.      FLUoxetine (PROZAC) 40 MG capsule Take 1 capsule (40 mg total) by mouth daily. 90 capsule 1   hydrochlorothiazide (HYDRODIURIL) 25 MG tablet Take 1 tablet (25 mg total) by mouth daily. 90 tablet 3   potassium chloride SA (KLOR-CON M) 20 MEQ tablet Take 1 tablet by mouth once daily 60 tablet 2   Vitamin D, Ergocalciferol, (DRISDOL) 1.25 MG (50000 UNIT) CAPS capsule Take 1 capsule (50,000 Units total) by mouth once a week. 12 capsule 0    topiramate (TOPAMAX) 50 MG tablet Take 1 tablet (50 mg total) by mouth at bedtime. 30 tablet 0   No facility-administered medications prior to visit.    Allergies  Allergen Reactions   Betadine [Povidone Iodine] Hives and Itching   Codeine Nausea And Vomiting    Review of Systems  Constitutional:  Negative for fever and malaise/fatigue.  HENT:  Negative for congestion.   Eyes:  Negative for blurred vision.  Respiratory:  Negative for shortness of breath.   Cardiovascular:  Negative for chest pain, palpitations and leg swelling.  Gastrointestinal:  Negative for abdominal pain, blood in stool and nausea.  Genitourinary:  Negative for dysuria and frequency.  Musculoskeletal:  Negative for falls.  Skin:  Negative for rash.  Neurological:  Negative for dizziness, loss of consciousness and headaches.  Endo/Heme/Allergies:  Negative for environmental allergies.  Psychiatric/Behavioral:  Negative for depression. The patient is not nervous/anxious.       Objective:    Physical Exam Vitals and nursing note reviewed.  Constitutional:      General: She is not in acute distress.    Appearance: Normal appearance. She is not ill-appearing.  HENT:     Head: Normocephalic and atraumatic.     Right Ear: External ear normal.     Left Ear: External ear normal.  Eyes:     Extraocular Movements: Extraocular movements intact.     Pupils: Pupils are equal, round, and reactive to light.  Cardiovascular:     Rate and Rhythm: Normal rate and regular rhythm.     Heart sounds: Normal heart sounds. No murmur heard.   No gallop.  Pulmonary:     Effort: Pulmonary effort is normal. No respiratory distress.     Breath sounds: Normal breath sounds. No wheezing or rales.  Skin:    General: Skin is warm and dry.  Neurological:     Mental Status: She is alert and oriented to person, place, and time.  Psychiatric:        Judgment: Judgment normal.    BP 124/86 (BP Location: Left Arm, Patient  Position: Sitting, Cuff Size: Normal)   Pulse 64   Temp 98.1 F (36.7 C) (Oral)   Resp 18   Ht '5\' 6"'  (1.676 m)   Wt 213 lb 9.6 oz (96.9 kg)   SpO2 98%   BMI 34.48 kg/m  Wt Readings from Last 3 Encounters:  07/02/21 213 lb 9.6 oz (96.9 kg)  07/01/21 210 lb (95.3 kg)  06/10/21 210 lb (95.3 kg)    Diabetic Foot Exam - Simple   No data filed    Lab Results  Component Value Date   WBC 5.2 07/01/2021   HGB 12.4 07/01/2021   HCT 37.7 07/01/2021   PLT 286 07/01/2021   GLUCOSE 91 07/01/2021   CHOL 137 02/20/2021   TRIG 80 02/20/2021   HDL 77 02/20/2021   LDLDIRECT 105.0 11/23/2010   LDLCALC 44 02/20/2021   ALT 17 03/18/2021   AST 22 03/18/2021   NA 138 07/01/2021   K 3.0 (L) 07/01/2021   CL 105 07/01/2021   CREATININE 0.69 07/01/2021   BUN 10 07/01/2021   CO2 28 07/01/2021   TSH 0.87 02/20/2021   INR 0.99 03/29/2013   HGBA1C 5.4 02/20/2021   MICROALBUR 0.9 12/09/2020    Lab Results  Component Value Date   TSH 0.87 02/20/2021   Lab Results  Component Value Date   WBC 5.2 07/01/2021   HGB 12.4 07/01/2021   HCT 37.7 07/01/2021   MCV 88.1 07/01/2021   PLT 286 07/01/2021   Lab Results  Component Value Date   NA 138 07/01/2021   K 3.0 (L) 07/01/2021   CHLORIDE 106 11/18/2016   CO2 28 07/01/2021   GLUCOSE 91 07/01/2021   BUN 10 07/01/2021   CREATININE 0.69 07/01/2021   BILITOT 0.8 03/18/2021   ALKPHOS 165 (  H) 03/18/2021   AST 22 03/18/2021   ALT 17 03/18/2021   PROT 6.8 03/18/2021   ALBUMIN 4.5 03/18/2021   CALCIUM 8.4 (L) 07/01/2021   ANIONGAP 5 07/01/2021   EGFR 101 03/18/2021   GFR 86.59 12/09/2020   Lab Results  Component Value Date   CHOL 137 02/20/2021   Lab Results  Component Value Date   HDL 77 02/20/2021   Lab Results  Component Value Date   LDLCALC 44 02/20/2021   Lab Results  Component Value Date   TRIG 80 02/20/2021   Lab Results  Component Value Date   CHOLHDL 1.8 02/20/2021   Lab Results  Component Value Date   HGBA1C  5.4 02/20/2021       Assessment & Plan:   Problem List Items Addressed This Visit       Unprioritized   Chest pain - Primary    Er visit reviewed  Labs and ekg normal Pain has resolved--- tylenol and rest helped  F/u prn          No orders of the defined types were placed in this encounter.   IAnn Held, DO, personally preformed the services described in this documentation.  All medical record entries made by the scribe were at my direction and in my presence.  I have reviewed the chart and discharge instructions (if applicable) and agree that the record reflects my personal performance and is accurate and complete. 07/02/2021   I,Anna Jackson,acting as a scribe for Ann Held, DO.,have documented all relevant documentation on the behalf of Ann Held, DO,as directed by  Ann Held, DO while in the presence of Ann Held, DO.   Ann Held, DO

## 2021-07-08 ENCOUNTER — Ambulatory Visit (INDEPENDENT_AMBULATORY_CARE_PROVIDER_SITE_OTHER): Payer: 59 | Admitting: Family Medicine

## 2021-07-08 ENCOUNTER — Encounter (INDEPENDENT_AMBULATORY_CARE_PROVIDER_SITE_OTHER): Payer: Self-pay | Admitting: Family Medicine

## 2021-07-08 VITALS — BP 137/78 | HR 63 | Temp 97.7°F | Ht 66.0 in | Wt 209.0 lb

## 2021-07-08 DIAGNOSIS — E669 Obesity, unspecified: Secondary | ICD-10-CM

## 2021-07-08 DIAGNOSIS — Z6833 Body mass index (BMI) 33.0-33.9, adult: Secondary | ICD-10-CM | POA: Diagnosis not present

## 2021-07-08 DIAGNOSIS — E8881 Metabolic syndrome: Secondary | ICD-10-CM

## 2021-07-08 DIAGNOSIS — F3289 Other specified depressive episodes: Secondary | ICD-10-CM | POA: Diagnosis not present

## 2021-07-09 MED ORDER — METFORMIN HCL 500 MG PO TABS: 500.0000 mg | ORAL_TABLET | Freq: Every day | ORAL | 0 refills | Status: DC

## 2021-07-21 ENCOUNTER — Encounter (INDEPENDENT_AMBULATORY_CARE_PROVIDER_SITE_OTHER): Payer: Self-pay | Admitting: Family Medicine

## 2021-07-21 NOTE — Telephone Encounter (Signed)
Dr.Beasley 

## 2021-07-21 NOTE — Progress Notes (Signed)
Chief Complaint:   OBESITY Anna Jackson is here to discuss her progress with her obesity treatment plan along with follow-up of her obesity related diagnoses. Anna Jackson is on the Category 2 Plan and states she is following her eating plan approximately 10% of the time. Anna Jackson states she is walking for 60 minutes 3 times per week.  Today's visit was #: 6 Starting weight: 218 lbs Starting date: 03/18/2021 Today's weight: 209 lbs Today's date: 07/08/2021 Total lbs lost to date: 9 Total lbs lost since last in-office visit: 1  Interim History: Anna Jackson continues to work on her diet and exercise. She has eaten out more but she is still doing well with increasing her protein.   Subjective:   1. Insulin resistance Lamoine notes increased episodes of feeling hypoglycemic. She is not skipping meals and she is working on increasing her protein.   2. Other depression, with emotional eating. Katena tried Topamax but it made her very sleepy, so she stopped it. She still struggles with some emotional eating behaviors.   Assessment/Plan:   1. Insulin resistance Anna Jackson agreed to start metformin 500 mg q AM with food, with no refills. Anna Jackson will continue to work on weight loss, exercise, and decreasing simple carbohydrates to help decrease the risk of diabetes. Anna Jackson agreed to follow-up with [place] as directed to closely monitor her progress.  - metFORMIN (GLUCOPHAGE) 500 MG tablet; Take 1 tablet (500 mg total) by mouth daily with breakfast.  Dispense: 30 tablet; Refill: 0  2. Other depression, with emotional eating. Anna Jackson agreed to discontinue Topamax, and emotional eating behavior strategies were discussed.   3. Obesity, Current BMI 33.8 Anna Jackson is currently in the action stage of change. As such, her goal is to continue with weight loss efforts. She has agreed to the Category 2 Plan.   Eating Out handout was given today.  Exercise goals: As is.  Behavioral modification strategies: increasing lean protein intake and meal  planning and cooking strategies.  Anna Jackson has agreed to follow-up with our clinic in 4 weeks. She was informed of the importance of frequent follow-up visits to maximize her success with intensive lifestyle modifications for her multiple health conditions.   Objective:   Blood pressure 137/78, pulse 63, temperature 97.7 F (36.5 C), height '5\' 6"'$  (1.676 m), weight 209 lb (94.8 kg), SpO2 97 %. Body mass index is 33.73 kg/m.  General: Cooperative, alert, well developed, in no acute distress. HEENT: Conjunctivae and lids unremarkable. Cardiovascular: Regular rhythm.  Lungs: Normal work of breathing. Neurologic: No focal deficits.   Lab Results  Component Value Date   CREATININE 0.69 07/01/2021   BUN 10 07/01/2021   NA 138 07/01/2021   K 3.0 (L) 07/01/2021   CL 105 07/01/2021   CO2 28 07/01/2021   Lab Results  Component Value Date   ALT 17 03/18/2021   AST 22 03/18/2021   ALKPHOS 165 (H) 03/18/2021   BILITOT 0.8 03/18/2021   Lab Results  Component Value Date   HGBA1C 5.4 02/20/2021   Lab Results  Component Value Date   INSULIN 6.4 03/18/2021   Lab Results  Component Value Date   TSH 0.87 02/20/2021   Lab Results  Component Value Date   CHOL 137 02/20/2021   HDL 77 02/20/2021   LDLCALC 44 02/20/2021   LDLDIRECT 105.0 11/23/2010   TRIG 80 02/20/2021   CHOLHDL 1.8 02/20/2021   Lab Results  Component Value Date   VD25OH 67 02/20/2021   VD25OH 22.00 (L) 12/31/2019  VD25OH 28 (L) 12/22/2009   Lab Results  Component Value Date   WBC 5.2 07/01/2021   HGB 12.4 07/01/2021   HCT 37.7 07/01/2021   MCV 88.1 07/01/2021   PLT 286 07/01/2021   Lab Results  Component Value Date   IRON 79 02/20/2021   TIBC 330 02/20/2021   FERRITIN 53 02/20/2021   Attestation Statements:   Reviewed by clinician on day of visit: allergies, medications, problem list, medical history, surgical history, family history, social history, and previous encounter notes.  Time spent on visit  including pre-visit chart review and post-visit care and charting was 40 minutes.   I, Trixie Dredge, am acting as transcriptionist for Dennard Nip, MD.  I have reviewed the above documentation for accuracy and completeness, and I agree with the above. -  Dennard Nip, MD

## 2021-07-22 ENCOUNTER — Telehealth (INDEPENDENT_AMBULATORY_CARE_PROVIDER_SITE_OTHER): Payer: 59 | Admitting: Family Medicine

## 2021-07-27 ENCOUNTER — Other Ambulatory Visit: Payer: Self-pay | Admitting: Family Medicine

## 2021-07-27 ENCOUNTER — Encounter: Payer: Self-pay | Admitting: Family Medicine

## 2021-07-27 DIAGNOSIS — F419 Anxiety disorder, unspecified: Secondary | ICD-10-CM

## 2021-07-27 MED ORDER — ALPRAZOLAM 0.25 MG PO TABS: 0.2500 mg | ORAL_TABLET | Freq: Three times a day (TID) | ORAL | 0 refills | Status: DC | PRN

## 2021-07-27 NOTE — Telephone Encounter (Signed)
Pt requesting refill on alprazolam. No longer on med list. Please advise.

## 2021-08-11 ENCOUNTER — Other Ambulatory Visit (INDEPENDENT_AMBULATORY_CARE_PROVIDER_SITE_OTHER): Payer: Self-pay | Admitting: Family Medicine

## 2021-08-11 DIAGNOSIS — E88819 Insulin resistance, unspecified: Secondary | ICD-10-CM

## 2021-08-11 DIAGNOSIS — E559 Vitamin D deficiency, unspecified: Secondary | ICD-10-CM

## 2021-08-11 DIAGNOSIS — E8881 Metabolic syndrome: Secondary | ICD-10-CM

## 2021-08-11 DIAGNOSIS — E7849 Other hyperlipidemia: Secondary | ICD-10-CM

## 2021-08-11 NOTE — Progress Notes (Addendum)
TeleHealth Visit:  This visit was completed with telemedicine (audio/video) technology. Anna Jackson has verbally consented to this TeleHealth visit. The patient is located at home, the provider is located at home. The participants in this visit include the listed provider and patient. The visit was conducted today via MyChart video.  OBESITY Anna Jackson is here to discuss her progress with her obesity treatment plan along with follow-up of her obesity related diagnoses.   Today's visit was # 7 Starting weight: 218 lbs Starting date: 03/18/2021 Weight at last in office visit: 209 lbs on 07/08/21 Total weight loss: 9 lbs at last in office visit on 07/08/21. Today's reported weight: 216 lbs   Nutrition Plan: the Category 2 Plan 10% adherence due to hunger. Hunger is poorly controlled. Cravings are moderately controlled.  Current exercise: none  Interim History: Anna Jackson reports polyphagia throughout the day.  She had side effects with metformin-polydipsia, hypoglycemia, increased hunger. She is not adhering to the category 2 plan and protein intake is low.  She drinks 2 to 3 glasses of sweet tea per day (1 gallon of tea with 1 cup of sugar).  Sometimes she feels like she wants to give up on trying to lose weight.  Assessment/Plan:  1. Hyperlipidemia LDL is at goal.  Her mother and sister both died from MIs in their 72s. Medication(s): Atorvastatin 20 mg. Patient denies myalgias.  Cardiovascular risk factors: dyslipidemia, family history of premature cardiovascular disease, hypertension, obesity (BMI >= 30 kg/m2), and sedentary lifestyle  Lab Results  Component Value Date   CHOL 137 02/20/2021   HDL 77 02/20/2021   LDLCALC 44 02/20/2021   LDLDIRECT 105.0 11/23/2010   TRIG 80 02/20/2021   CHOLHDL 1.8 02/20/2021   Lab Results  Component Value Date   ALT 17 03/18/2021   AST 22 03/18/2021   ALKPHOS 165 (H) 03/18/2021   BILITOT 0.8 03/18/2021   The 10-year ASCVD risk score (Arnett DK, et al.,  2019) is: 4.4%   Values used to calculate the score:     Age: 61 years     Sex: Female     Is Non-Hispanic African American: No     Diabetic: Yes     Tobacco smoker: No     Systolic Blood Pressure: 673 mmHg     Is BP treated: Yes     HDL Cholesterol: 77 mg/dL     Total Cholesterol: 137 mg/dL  Plan: Continue statin.  2. Vitamin D Deficiency Vitamin D is at goal of 50. She is on weekly prescription Vitamin D 50,000 IU.  Lab Results  Component Value Date   VD25OH 67 02/20/2021   VD25OH 22.00 (L) 12/31/2019   VD25OH 28 (L) 12/22/2009    Plan: Refill prescription vitamin D 50,000 IU weekly. Labs ordered separately and drawn this morning.  3. Obesity: Current BMI 33.7 Anna Jackson is currently in the action stage of change. As such, her goal is to continue with weight loss efforts.  She has agreed to the Category 2 Plan.   We will start [place] since Wegovy is not available at lower doses.  If insurance will not cover, will try to get coverage for Ozempic.  She will inject 0.6 mg for 1 week and then increase to 1.2 mg if no nausea. Anna Jackson denies personal or family history of thyroid cancer, history of pancreatitis, or current cholelithiasis. Anna Jackson was informed of the most common side effects (nausea, constipation, diarrhea). New prescription:  Liraglutide -Weight Management (SAXENDA) 18 MG/3ML SOPN    Sig:  Inject 0.6 mg into the skin daily for 7 days, THEN 1.2 mg daily for 21 days.    Dispense:  15 mL    Refill:  0   Encouraged her to try unsweet tea with artificial sweetener.  Exercise goals: No exercise has been prescribed at this time.  Behavioral modification strategies: increasing lean protein intake and decreasing simple carbohydrates.  Anna Jackson has agreed to follow-up with our clinic in 4 weeks.   No orders of the defined types were placed in this encounter.   Medications Discontinued During This Encounter  Medication Reason   Vitamin D, Ergocalciferol, (DRISDOL) 1.25 MG (50000  UNIT) CAPS capsule Reorder     Meds ordered this encounter  Medications   Liraglutide -Weight Management (SAXENDA) 18 MG/3ML SOPN    Sig: Inject 0.6 mg into the skin daily for 7 days, THEN 1.2 mg daily for 21 days.    Dispense:  15 mL    Refill:  0    Order Specific Question:   Supervising Provider    Answer:   Georgia Lopes [[id number]]   Vitamin D, Ergocalciferol, (DRISDOL) 1.25 MG (50000 UNIT) CAPS capsule    Sig: Take 1 capsule (50,000 Units total) by mouth once a week.    Dispense:  12 capsule    Refill:  0    Order Specific Question:   Supervising Provider    Answer:   Georgia Lopes [[id number]]   Insulin Pen Needle 32G X 4 MM MISC    Sig: Use 1 needle daily to inject medication.    Dispense:  100 each    Refill:  0    Order Specific Question:   Supervising Provider    Answer:   Georgia Lopes [[id number]]      Objective:   VITALS: Per patient if applicable, see vitals. GENERAL: Alert and in no acute distress. CARDIOPULMONARY: No increased WOB. Speaking in clear sentences.  PSYCH: Pleasant and cooperative. Speech normal rate and rhythm. Affect is appropriate. Insight and judgement are appropriate. Attention is focused, linear, and appropriate.  NEURO: Oriented as arrived to appointment on time with no prompting.   Lab Results  Component Value Date   CREATININE 0.69 07/01/2021   BUN 10 07/01/2021   NA 138 07/01/2021   K 3.0 (L) 07/01/2021   CL 105 07/01/2021   CO2 28 07/01/2021   Lab Results  Component Value Date   ALT 17 03/18/2021   AST 22 03/18/2021   ALKPHOS 165 (H) 03/18/2021   BILITOT 0.8 03/18/2021   Lab Results  Component Value Date   HGBA1C 5.4 02/20/2021   Lab Results  Component Value Date   INSULIN 6.4 03/18/2021   Lab Results  Component Value Date   TSH 0.87 02/20/2021   Lab Results  Component Value Date   CHOL 137 02/20/2021   HDL 77 02/20/2021   LDLCALC 44 02/20/2021   LDLDIRECT 105.0 11/23/2010   TRIG 80 02/20/2021   CHOLHDL 1.8 02/20/2021    Lab Results  Component Value Date   WBC 5.2 07/01/2021   HGB 12.4 07/01/2021   HCT 37.7 07/01/2021   MCV 88.1 07/01/2021   PLT 286 07/01/2021   Lab Results  Component Value Date   IRON 79 02/20/2021   TIBC 330 02/20/2021   FERRITIN 53 02/20/2021   Lab Results  Component Value Date   VD25OH 67 02/20/2021   VD25OH 22.00 (L) 12/31/2019   VD25OH 28 (L) 12/22/2009    Attestation Statements:  Reviewed by clinician on day of visit: allergies, medications, problem list, medical history, surgical history, family history, social history, and previous encounter notes.

## 2021-08-12 ENCOUNTER — Encounter (INDEPENDENT_AMBULATORY_CARE_PROVIDER_SITE_OTHER): Payer: Self-pay

## 2021-08-12 ENCOUNTER — Telehealth (INDEPENDENT_AMBULATORY_CARE_PROVIDER_SITE_OTHER): Payer: 59 | Admitting: Family Medicine

## 2021-08-12 ENCOUNTER — Telehealth (INDEPENDENT_AMBULATORY_CARE_PROVIDER_SITE_OTHER): Payer: Self-pay | Admitting: Family Medicine

## 2021-08-12 ENCOUNTER — Ambulatory Visit (INDEPENDENT_AMBULATORY_CARE_PROVIDER_SITE_OTHER): Payer: 59 | Admitting: Family Medicine

## 2021-08-12 ENCOUNTER — Encounter (INDEPENDENT_AMBULATORY_CARE_PROVIDER_SITE_OTHER): Payer: Self-pay | Admitting: Family Medicine

## 2021-08-12 DIAGNOSIS — E669 Obesity, unspecified: Secondary | ICD-10-CM

## 2021-08-12 DIAGNOSIS — Z6833 Body mass index (BMI) 33.0-33.9, adult: Secondary | ICD-10-CM

## 2021-08-12 DIAGNOSIS — E66812 Obesity, class 2: Secondary | ICD-10-CM

## 2021-08-12 DIAGNOSIS — E559 Vitamin D deficiency, unspecified: Secondary | ICD-10-CM

## 2021-08-12 DIAGNOSIS — E8881 Metabolic syndrome: Secondary | ICD-10-CM

## 2021-08-12 DIAGNOSIS — E7849 Other hyperlipidemia: Secondary | ICD-10-CM

## 2021-08-12 DIAGNOSIS — E88819 Insulin resistance, unspecified: Secondary | ICD-10-CM

## 2021-08-12 MED ORDER — VITAMIN D (ERGOCALCIFEROL) 1.25 MG (50000 UNIT) PO CAPS: 50000.0000 [IU] | ORAL_CAPSULE | ORAL | 0 refills | Status: DC

## 2021-08-12 MED ORDER — SAXENDA 18 MG/3ML ~~LOC~~ SOPN: PEN_INJECTOR | SUBCUTANEOUS | 0 refills | Status: DC

## 2021-08-12 MED ORDER — OZEMPIC (0.25 OR 0.5 MG/DOSE) 2 MG/3ML ~~LOC~~ SOPN: 0.2500 mg | PEN_INJECTOR | SUBCUTANEOUS | 0 refills | Status: DC

## 2021-08-12 MED ORDER — INSULIN PEN NEEDLE 32G X 4 MM MISC: 0 refills | Status: DC

## 2021-08-12 NOTE — Telephone Encounter (Signed)
Anna Jackson - Prior authorization denied for [employer]. Per insurance: The requested medication and/or diagnosis are not a covered benefit and excluded from coverage in accordance with the terms and conditions of your plan benefit. Patient sent denial message via mychart.

## 2021-08-12 NOTE — Telephone Encounter (Signed)
PA needed

## 2021-08-12 NOTE — Telephone Encounter (Signed)
Patient sent message via mychart. Saxenda denied - not a covered benefit.

## 2021-08-12 NOTE — Telephone Encounter (Signed)
FYI

## 2021-08-13 ENCOUNTER — Telehealth (INDEPENDENT_AMBULATORY_CARE_PROVIDER_SITE_OTHER): Payer: Self-pay | Admitting: Family Medicine

## 2021-08-13 ENCOUNTER — Encounter (INDEPENDENT_AMBULATORY_CARE_PROVIDER_SITE_OTHER): Payer: Self-pay

## 2021-08-13 LAB — COMPREHENSIVE METABOLIC PANEL
ALT: 12 IU/L (ref 0–32)
AST: 12 IU/L (ref 0–40)
Albumin/Globulin Ratio: 2 (ref 1.2–2.2)
Albumin: 4.1 g/dL (ref 3.8–4.9)
Alkaline Phosphatase: 128 IU/L — ABNORMAL HIGH (ref 44–121)
BUN/Creatinine Ratio: 17 (ref 9–23)
BUN: 13 mg/dL (ref 6–24)
Bilirubin Total: 0.8 mg/dL (ref 0.0–1.2)
CO2: 23 mmol/L (ref 20–29)
Calcium: 9.1 mg/dL (ref 8.7–10.2)
Chloride: 103 mmol/L (ref 96–106)
Creatinine, Ser: 0.76 mg/dL (ref 0.57–1.00)
Globulin, Total: 2.1 g/dL (ref 1.5–4.5)
Glucose: 88 mg/dL (ref 70–99)
Potassium: 4.1 mmol/L (ref 3.5–5.2)
Sodium: 140 mmol/L (ref 134–144)
Total Protein: 6.2 g/dL (ref 6.0–8.5)
eGFR: 91 mL/min/{1.73_m2} (ref >59)

## 2021-08-13 LAB — HEMOGLOBIN A1C
Est. average glucose Bld gHb Est-mCnc: 108 mg/dL
Hgb A1c MFr Bld: 5.4 % (ref 4.8–5.6)

## 2021-08-13 LAB — LIPID PANEL WITH LDL/HDL RATIO
Cholesterol, Total: 189 mg/dL (ref 100–199)
HDL: 77 mg/dL (ref >39)
LDL Chol Calc (NIH): 93 mg/dL (ref 0–99)
LDL/HDL Ratio: 1.2 ratio (ref 0.0–3.2)
Triglycerides: 106 mg/dL (ref 0–149)
VLDL Cholesterol Cal: 19 mg/dL (ref 5–40)

## 2021-08-13 LAB — INSULIN, RANDOM: INSULIN: 9.3 u[IU]/mL (ref 2.6–24.9)

## 2021-08-13 LAB — VITAMIN D 25 HYDROXY (VIT D DEFICIENCY, FRACTURES): Vit D, 25-Hydroxy: 40.2 ng/mL (ref 30.0–100.0)

## 2021-08-13 NOTE — Telephone Encounter (Signed)
Anna Jackson - Prior authorization denied for [employer]. Per insurance: Patient does not have type 2 diabetes. Patient sent denial message via mychart.

## 2021-08-13 NOTE — Telephone Encounter (Signed)
Please notify pt

## 2021-08-13 NOTE — Telephone Encounter (Signed)
Please see chart, patient was sent mychart message in reference to [place] yesterday. Prior authorization is in process for Ozempic. Per message sent to patient we will notify patient once a response is received per [facility].

## 2021-09-08 ENCOUNTER — Encounter (INDEPENDENT_AMBULATORY_CARE_PROVIDER_SITE_OTHER): Payer: Self-pay | Admitting: Family Medicine

## 2021-09-08 ENCOUNTER — Ambulatory Visit (INDEPENDENT_AMBULATORY_CARE_PROVIDER_SITE_OTHER): Payer: 59 | Admitting: Family Medicine

## 2021-09-08 VITALS — BP 136/81 | HR 56 | Temp 97.8°F | Ht 66.0 in | Wt 214.0 lb

## 2021-09-08 DIAGNOSIS — F3289 Other specified depressive episodes: Secondary | ICD-10-CM | POA: Diagnosis not present

## 2021-09-08 DIAGNOSIS — Z6834 Body mass index (BMI) 34.0-34.9, adult: Secondary | ICD-10-CM | POA: Diagnosis not present

## 2021-09-08 DIAGNOSIS — I10 Essential (primary) hypertension: Secondary | ICD-10-CM

## 2021-09-08 DIAGNOSIS — E669 Obesity, unspecified: Secondary | ICD-10-CM

## 2021-09-08 DIAGNOSIS — E66812 Obesity, class 2: Secondary | ICD-10-CM

## 2021-09-08 MED ORDER — BUPROPION HCL ER (SR) 150 MG PO TB12: 150.0000 mg | ORAL_TABLET | Freq: Every day | ORAL | 0 refills | Status: DC

## 2021-09-15 NOTE — Progress Notes (Unsigned)
Chief Complaint:   OBESITY Anna Jackson is here to discuss her progress with her obesity treatment plan along with follow-up of her obesity related diagnoses. Anna Jackson is on the Category 2 Plan and states she is following her eating plan approximately 0% of the time. Anna Jackson states she is doing 0 minutes 0 times per week.  Today's visit was #: 8 Starting weight: 218 lbs Starting date: 03/18/2021 Today's weight: 214 lbs Today's date: 09/08/2021 Total lbs lost to date: 4 Total lbs lost since last in-office visit: 0  Interim History: Anna Jackson is retaining some fluid today.  She has been on vacation and actually did well with minimizing eating out and eating sugar.  She is still struggling with p.m. cravings however.  Subjective:   1. Essential hypertension Anna Jackson's blood pressure is well controlled with her diet, exercise, and weight loss.  No side effects were noted.  2. Other depression, emotional eating behaviors Anna Jackson notes increased PM cravings especially for sugar.  She is open to discussing medication options to help her.  Assessment/Plan:   1. Essential hypertension Anna Jackson will continue with her diet, exercise, and weight loss, and we will continue to follow.  2. Other depression, emotional eating behaviors Anna Jackson agreed to start Wellbutrin SR 150 mg every morning, with no refills.  We will follow-up at her next visit.  - buPROPion (WELLBUTRIN SR) 150 MG 12 hr tablet; Take 1 tablet (150 mg total) by mouth daily.  Dispense: 30 tablet; Refill: 0  3. Obesity, Current BMI 34.5 Anna Jackson is currently in the action stage of change. As such, her goal is to continue with weight loss efforts. She has agreed to the Category 2 Plan.   Behavioral modification strategies: increasing lean protein intake.  Anna Jackson has agreed to follow-up with our clinic in 3 weeks. She was informed of the importance of frequent follow-up visits to maximize her success with intensive lifestyle modifications for her multiple health  conditions.   Objective:   Blood pressure 136/81, pulse (!) 56, temperature 97.8 F (36.6 C), height '5\' 6"'$  (1.676 m), weight 214 lb (97.1 kg), SpO2 99 %. Body mass index is 34.54 kg/m.  General: Cooperative, alert, well developed, in no acute distress. HEENT: Conjunctivae and lids unremarkable. Cardiovascular: Regular rhythm.  Lungs: Normal work of breathing. Neurologic: No focal deficits.   Lab Results  Component Value Date   CREATININE 0.76 08/12/2021   BUN 13 08/12/2021   NA 140 08/12/2021   K 4.1 08/12/2021   CL 103 08/12/2021   CO2 23 08/12/2021   Lab Results  Component Value Date   ALT 12 08/12/2021   AST 12 08/12/2021   ALKPHOS 128 (H) 08/12/2021   BILITOT 0.8 08/12/2021   Lab Results  Component Value Date   HGBA1C 5.4 08/12/2021   HGBA1C 5.4 02/20/2021   Lab Results  Component Value Date   INSULIN 9.3 08/12/2021   INSULIN 6.4 03/18/2021   Lab Results  Component Value Date   TSH 0.87 02/20/2021   Lab Results  Component Value Date   CHOL 189 08/12/2021   HDL 77 08/12/2021   LDLCALC 93 08/12/2021   LDLDIRECT 105.0 11/23/2010   TRIG 106 08/12/2021   CHOLHDL 1.8 02/20/2021   Lab Results  Component Value Date   VD25OH 40.2 08/12/2021   VD25OH 67 02/20/2021   VD25OH 22.00 (L) 12/31/2019   Lab Results  Component Value Date   WBC 5.2 07/01/2021   HGB 12.4 07/01/2021   HCT 37.7 07/01/2021  MCV 88.1 07/01/2021   PLT 286 07/01/2021   Lab Results  Component Value Date   IRON 79 02/20/2021   TIBC 330 02/20/2021   FERRITIN 53 02/20/2021   Attestation Statements:   Reviewed by clinician on day of visit: allergies, medications, problem list, medical history, surgical history, family history, social history, and previous encounter notes.   I, Trixie Dredge, am acting as transcriptionist for Dennard Nip, MD.  I have reviewed the above documentation for accuracy and completeness, and I agree with the above. -  Dennard Nip, MD

## 2021-09-23 ENCOUNTER — Encounter (INDEPENDENT_AMBULATORY_CARE_PROVIDER_SITE_OTHER): Payer: Self-pay

## 2021-09-24 NOTE — Progress Notes (Signed)
TeleHealth Visit:  This visit was completed with telemedicine (audio/video) technology. Anna Jackson has verbally consented to this TeleHealth visit. The patient is located at home, the provider is located at home. The participants in this visit include the listed provider and patient. The visit was conducted today via MyChart video.  OBESITY Anna Jackson is here to discuss her progress with her obesity treatment plan along with follow-up of her obesity related diagnoses.   Today's visit was # 9 Starting weight: 218 lbs Starting date: 03/18/2021 Weight at last in office visit: 214 lbs on 09/08/21 Total weight loss: 4 lbs at last in office visit on 09/08/21. Today's reported weight: 1 lbs   Nutrition Plan: Weight Watchers online for 2 weeks Hunger is well controlled. Current exercise: none. More active.  Interim History: Anna Jackson switched to Weight Watchers online a few weeks ago and so far she likes it.  She has lost 1 pound.  She feels she is getting in adequate protein. Drinks 16 ounces of water per day and 24 ounces of sweet tea per day.  Sweet tea made with 1 cup of sugar per gallon of water.  Has no coverage for GLP-1 therapy.  We have tried to obtain Saxenda and Ozempic.  She will be getting a new insurance policy in September which she thinks will cover antiobesity medication.  Assessment/Plan:  1.Other depression/emotional eating She was started on bupropion last office visit.  Has had some mild insomnia.  Takes the bupropion in the morning.  She feels the bupropion is helping with appetite, cravings, and her energy level.  Plan: Continue bupropion 150 mg every morning.  2. Vitamin D Deficiency Vitamin D is not at goal of 50.  It is low at 40.2 on 08/12/2021. She is on weekly prescription Vitamin D 50,000 IU.  Lab Results  Component Value Date   VD25OH 40.2 08/12/2021   VD25OH 67 02/20/2021   VD25OH 22.00 (L) 12/31/2019    Plan: Continue prescription vitamin D 50,000 IU  weekly.   3. Obesity: Current BMI 34.5 Anna Jackson is currently in the action stage of change. As such, her goal is to continue with weight loss efforts.  She has agreed to use the weight watcher plan.  Exercise goals: No exercise has been prescribed at this time.  Behavioral modification strategies: increasing lean protein intake, decreasing simple carbohydrates, and decreasing liquid calories. Will try to reduce sweet tea intake to 12 ounces per day. Encouraged her to decrease sugar in sweet tea to three-quarter cup in 1 gallon.  Anna Jackson has agreed to follow-up with our clinic in 3 weeks.   No orders of the defined types were placed in this encounter.   There are no discontinued medications.   No orders of the defined types were placed in this encounter.     Objective:   VITALS: Per patient if applicable, see vitals. GENERAL: Alert and in no acute distress. CARDIOPULMONARY: No increased WOB. Speaking in clear sentences.  PSYCH: Pleasant and cooperative. Speech normal rate and rhythm. Affect is appropriate. Insight and judgement are appropriate. Attention is focused, linear, and appropriate.  NEURO: Oriented as arrived to appointment on time with no prompting.   Lab Results  Component Value Date   CREATININE 0.76 08/12/2021   BUN 13 08/12/2021   NA 140 08/12/2021   K 4.1 08/12/2021   CL 103 08/12/2021   CO2 23 08/12/2021   Lab Results  Component Value Date   ALT 12 08/12/2021   AST 12 08/12/2021   ALKPHOS  128 (H) 08/12/2021   BILITOT 0.8 08/12/2021   Lab Results  Component Value Date   HGBA1C 5.4 08/12/2021   HGBA1C 5.4 02/20/2021   Lab Results  Component Value Date   INSULIN 9.3 08/12/2021   INSULIN 6.4 03/18/2021   Lab Results  Component Value Date   TSH 0.87 02/20/2021   Lab Results  Component Value Date   CHOL 189 08/12/2021   HDL 77 08/12/2021   LDLCALC 93 08/12/2021   LDLDIRECT 105.0 11/23/2010   TRIG 106 08/12/2021   CHOLHDL 1.8 02/20/2021   Lab  Results  Component Value Date   WBC 5.2 07/01/2021   HGB 12.4 07/01/2021   HCT 37.7 07/01/2021   MCV 88.1 07/01/2021   PLT 286 07/01/2021   Lab Results  Component Value Date   IRON 79 02/20/2021   TIBC 330 02/20/2021   FERRITIN 53 02/20/2021   Lab Results  Component Value Date   VD25OH 40.2 08/12/2021   VD25OH 67 02/20/2021   VD25OH 22.00 (L) 12/31/2019    Attestation Statements:   Reviewed by clinician on day of visit: allergies, medications, problem list, medical history, surgical history, family history, social history, and previous encounter notes.  Time spent on visit including the items listed below was 31 minutes.  -preparing to see the patient (e.g., review of tests, history, previous notes) -obtaining and/or reviewing separately obtained history -counseling and educating the patient/family/caregiver -documenting clinical information in the electronic or other health record

## 2021-09-28 ENCOUNTER — Telehealth (INDEPENDENT_AMBULATORY_CARE_PROVIDER_SITE_OTHER): Payer: 59 | Admitting: Family Medicine

## 2021-09-28 ENCOUNTER — Encounter (INDEPENDENT_AMBULATORY_CARE_PROVIDER_SITE_OTHER): Payer: Self-pay | Admitting: Family Medicine

## 2021-09-28 DIAGNOSIS — Z6834 Body mass index (BMI) 34.0-34.9, adult: Secondary | ICD-10-CM | POA: Diagnosis not present

## 2021-09-28 DIAGNOSIS — E559 Vitamin D deficiency, unspecified: Secondary | ICD-10-CM

## 2021-09-28 DIAGNOSIS — E66812 Obesity, class 2: Secondary | ICD-10-CM

## 2021-09-28 DIAGNOSIS — E669 Obesity, unspecified: Secondary | ICD-10-CM | POA: Diagnosis not present

## 2021-09-28 DIAGNOSIS — F3289 Other specified depressive episodes: Secondary | ICD-10-CM

## 2021-10-04 ENCOUNTER — Other Ambulatory Visit (INDEPENDENT_AMBULATORY_CARE_PROVIDER_SITE_OTHER): Payer: Self-pay | Admitting: Family Medicine

## 2021-10-04 DIAGNOSIS — F3289 Other specified depressive episodes: Secondary | ICD-10-CM

## 2021-10-16 ENCOUNTER — Other Ambulatory Visit (INDEPENDENT_AMBULATORY_CARE_PROVIDER_SITE_OTHER): Payer: Self-pay | Admitting: Family Medicine

## 2021-10-16 DIAGNOSIS — F3289 Other specified depressive episodes: Secondary | ICD-10-CM

## 2021-10-22 ENCOUNTER — Ambulatory Visit (INDEPENDENT_AMBULATORY_CARE_PROVIDER_SITE_OTHER): Payer: 59 | Admitting: Family Medicine

## 2021-10-22 ENCOUNTER — Encounter (INDEPENDENT_AMBULATORY_CARE_PROVIDER_SITE_OTHER): Payer: Self-pay | Admitting: Family Medicine

## 2021-10-22 VITALS — BP 122/78 | HR 70 | Temp 97.7°F | Ht 66.0 in | Wt 214.0 lb

## 2021-10-22 DIAGNOSIS — E669 Obesity, unspecified: Secondary | ICD-10-CM | POA: Diagnosis not present

## 2021-10-22 DIAGNOSIS — F3289 Other specified depressive episodes: Secondary | ICD-10-CM

## 2021-10-22 DIAGNOSIS — E559 Vitamin D deficiency, unspecified: Secondary | ICD-10-CM | POA: Diagnosis not present

## 2021-10-22 DIAGNOSIS — Z6834 Body mass index (BMI) 34.0-34.9, adult: Secondary | ICD-10-CM | POA: Diagnosis not present

## 2021-10-22 MED ORDER — BUPROPION HCL ER (SR) 200 MG PO TB12: 200.0000 mg | ORAL_TABLET | Freq: Every morning | ORAL | 0 refills | Status: DC

## 2021-10-28 NOTE — Progress Notes (Unsigned)
Chief Complaint:   OBESITY Anna Jackson is here to discuss her progress with her obesity treatment plan along with follow-up of her obesity related diagnoses. Anna Jackson is on [employer] and states she is following her eating plan approximately 0% of the time. Anna Jackson states she is walking for 15 minutes 7 times per week.  Today's visit was #: 10 Starting weight: 218 lbs Starting date: 03/18/2021 Today's weight: 214 lbs Today's date: 10/22/2021 Total lbs lost to date: 4 Total lbs lost since last in-office visit: 0  Interim History: Anna Jackson continues to work on her diet, but she is struggling to follow a plan closely.  She feels she may not be eating enough.  Subjective:   1. Vitamin D deficiency Anna Jackson is on vitamin D, and her recent labs were not at goal.  2. Other depression, emotional eating behaviors Anna Jackson is stable on Wellbutrin and she felt it helped initially, but not so much currently.  He notes increased fatigue and she is struggling with meal planning.  Assessment/Plan:   1. Vitamin D deficiency Anna Jackson will continue vitamin D, and we will recheck labs in 2 months.  2. Other depression, emotional eating behaviors Anna Jackson agreed to increase Wellbutrin SR to 200 mg every morning, and we will refill for 1 month.  - buPROPion (WELLBUTRIN SR) 200 MG 12 hr tablet; Take 1 tablet (200 mg total) by mouth every morning.  Dispense: 30 tablet; Refill: 0  3. Obesity, Current BMI 34.7 Anna Jackson is currently in the action stage of change. As such, her goal is to continue with weight loss efforts. She has agreed to change to keeping a food journal and adhering to recommended goals of 1200 calories and 75+ grams of protein daily.   Exercise goals: As is.   Behavioral modification strategies: increasing lean protein intake and no skipping meals.  Anna Jackson has agreed to follow-up with our clinic in 3 weeks. She was informed of the importance of frequent follow-up visits to maximize her success with intensive  lifestyle modifications for her multiple health conditions.   Objective:   Blood pressure 122/78, pulse 70, temperature 97.7 F (36.5 C), height '5\' 6"'$  (1.676 m), weight 214 lb (97.1 kg), SpO2 94 %. Body mass index is 34.54 kg/m.  General: Cooperative, alert, well developed, in no acute distress. HEENT: Conjunctivae and lids unremarkable. Cardiovascular: Regular rhythm.  Lungs: Normal work of breathing. Neurologic: No focal deficits.   Lab Results  Component Value Date   CREATININE 0.76 08/12/2021   BUN 13 08/12/2021   NA 140 08/12/2021   K 4.1 08/12/2021   CL 103 08/12/2021   CO2 23 08/12/2021   Lab Results  Component Value Date   ALT 12 08/12/2021   AST 12 08/12/2021   ALKPHOS 128 (H) 08/12/2021   BILITOT 0.8 08/12/2021   Lab Results  Component Value Date   HGBA1C 5.4 08/12/2021   HGBA1C 5.4 02/20/2021   Lab Results  Component Value Date   INSULIN 9.3 08/12/2021   INSULIN 6.4 03/18/2021   Lab Results  Component Value Date   TSH 0.87 02/20/2021   Lab Results  Component Value Date   CHOL 189 08/12/2021   HDL 77 08/12/2021   LDLCALC 93 08/12/2021   LDLDIRECT 105.0 11/23/2010   TRIG 106 08/12/2021   CHOLHDL 1.8 02/20/2021   Lab Results  Component Value Date   VD25OH 40.2 08/12/2021   VD25OH 67 02/20/2021   VD25OH 22.00 (L) 12/31/2019   Lab Results  Component Value  Date   WBC 5.2 07/01/2021   HGB 12.4 07/01/2021   HCT 37.7 07/01/2021   MCV 88.1 07/01/2021   PLT 286 07/01/2021   Lab Results  Component Value Date   IRON 79 02/20/2021   TIBC 330 02/20/2021   FERRITIN 53 02/20/2021   Attestation Statements:   Reviewed by clinician on day of visit: allergies, medications, problem list, medical history, surgical history, family history, social history, and previous encounter notes.   I, Trixie Dredge, am acting as transcriptionist for Dennard Nip, MD.  I have reviewed the above documentation for accuracy and completeness, and I agree with the  above. -  Dennard Nip, MD

## 2021-10-30 ENCOUNTER — Telehealth: Payer: Self-pay | Admitting: Family Medicine

## 2021-10-30 NOTE — Telephone Encounter (Signed)
Pt called in to make an appt. She stated she has a metal plate in her hand and something happed to it last night and it "popped" and is now sending pain all up her arm. Advised pt she may need to seek UC she stated she did not have the money for that. Advised pt I would ask the nurse, Heather recommended ED or UC. Advised pt she would need to seek immediate care per nurse's recommendation as we do not have the equipment to fix this. She stated pcp could just order an x-ray. Advised pt this was per the cma's request. She stated we were just going to leave her to "suffer". Advised pt we want her to seek help and this would be the best option. Pt muttered something under her breath, advised pt I could not hear her and she screamed at someone else in the room "that was NOT a nice lady!" and she disconnected the call.

## 2021-10-30 NOTE — Telephone Encounter (Signed)
LM requesting call back with questions.  Recommended patient contact [facility] walk-in [facility].  Address, phone number and hours provided on message.

## 2021-10-30 NOTE — Telephone Encounter (Signed)
FYI. Pt instructed to go to UC or ED.

## 2021-11-09 NOTE — Progress Notes (Signed)
TeleHealth Visit:  This visit was completed with telemedicine (audio/video) technology. Anna Jackson has verbally consented to this TeleHealth visit. The patient is located at home, the provider is located at home. The participants in this visit include the listed provider and patient. The visit was conducted today via MyChart video.  Anna Jackson Anna Jackson is here to discuss her progress with her Anna Jackson treatment plan along with follow-up of her Anna Jackson related diagnoses.   Today's visit was # 11 Starting weight: 218 lbs Starting date: 03/18/2021 Weight at last in office visit: 214 lbs on 10/22/21 Total weight loss: 4 lbs at last in office visit on 10/22/21. Today's reported weight: 210 lbs   Nutrition Plan: keeping a food journal and adhering to [place] [address] calories and 75+ gms protein.   Current exercise: walking 15-25 minutes 2-4 days per week.   Interim History: Anna Jackson has been journaling since her last visit with Dr. Leafy Ro and has lost a few pounds.  She likes journaling and feels like this works well for her.  She is keeping calories around 1100/day.  However she thought her protein goal was 45 g/day so she has been low on protein.  She does have sweet tea daily but journals the calories. She started walking a few weeks ago for exercise.  Assessment/Plan:  1.  Other depression/emotional eating Anna Jackson has had issues with stress/emotional eating. Currently this is well controlled. Overall mood is stable.  Dose of bupropion increased to 200 mg daily from 150 mg daily.  She feels this has made a difference with cravings.  Plan: Continue bupropion 200 mg daily  2. Vitamin D Deficiency Vitamin D is not at goal of 50.  Last vitamin D level was slightly low at 40.2 on 08/12/2021. She is on weekly prescription Vitamin D 50,000 IU.  Lab Results  Component Value Date   VD25OH 40.2 08/12/2021   VD25OH 67 02/20/2021   VD25OH 22.00 (L) 12/31/2019    Plan: Refill prescription  vitamin D 50,000 IU weekly.   3. Anna Jackson: Current BMI 34.6 Anna Jackson is currently in the action stage of change. As such, her goal is to continue with weight loss efforts.  She has agreed to keeping a food journal and adhering to recommended goals of 1100-1300 calories and 75 gms protein daily.  Provided high-protein breakfast ideas.  Exercise goals: as is  Behavioral modification strategies: increasing lean protein intake, decreasing simple carbohydrates, decreasing liquid calories, and meal planning and cooking strategies.  Anna Jackson has agreed to follow-up with our clinic in 3 weeks.   No orders of the defined types were placed in this encounter.   Medications Discontinued During This Encounter  Medication Reason   Vitamin D, Ergocalciferol, (DRISDOL) 1.25 MG (50000 UNIT) CAPS capsule Reorder     Meds ordered this encounter  Medications   Vitamin D, Ergocalciferol, (DRISDOL) 1.25 MG (50000 UNIT) CAPS capsule    Sig: Take 1 capsule (50,000 Units total) by mouth once a week.    Dispense:  12 capsule    Refill:  0    Order Specific Question:   Supervising Provider    Answer:   Dell Ponto [[id number]]      Objective:   VITALS: Per patient if applicable, see vitals. GENERAL: Alert and in no acute distress. CARDIOPULMONARY: No increased WOB. Speaking in clear sentences.  PSYCH: Pleasant and cooperative. Speech normal rate and rhythm. Affect is appropriate. Insight and judgement are appropriate. Attention is focused, linear, and appropriate.  NEURO: Oriented as arrived  to appointment on time with no prompting.   Lab Results  Component Value Date   CREATININE 0.76 08/12/2021   BUN 13 08/12/2021   NA 140 08/12/2021   K 4.1 08/12/2021   CL 103 08/12/2021   CO2 23 08/12/2021   Lab Results  Component Value Date   ALT 12 08/12/2021   AST 12 08/12/2021   ALKPHOS 128 (H) 08/12/2021   BILITOT 0.8 08/12/2021   Lab Results  Component Value Date   HGBA1C 5.4 08/12/2021   HGBA1C 5.4  02/20/2021   Lab Results  Component Value Date   INSULIN 9.3 08/12/2021   INSULIN 6.4 03/18/2021   Lab Results  Component Value Date   TSH 0.87 02/20/2021   Lab Results  Component Value Date   CHOL 189 08/12/2021   HDL 77 08/12/2021   LDLCALC 93 08/12/2021   LDLDIRECT 105.0 11/23/2010   TRIG 106 08/12/2021   CHOLHDL 1.8 02/20/2021   Lab Results  Component Value Date   WBC 5.2 07/01/2021   HGB 12.4 07/01/2021   HCT 37.7 07/01/2021   MCV 88.1 07/01/2021   PLT 286 07/01/2021   Lab Results  Component Value Date   IRON 79 02/20/2021   TIBC 330 02/20/2021   FERRITIN 53 02/20/2021   Lab Results  Component Value Date   VD25OH 40.2 08/12/2021   VD25OH 67 02/20/2021   VD25OH 22.00 (L) 12/31/2019    Attestation Statements:   Reviewed by clinician on day of visit: allergies, medications, problem list, medical history, surgical history, family history, social history, and previous encounter notes.

## 2021-11-10 ENCOUNTER — Encounter (INDEPENDENT_AMBULATORY_CARE_PROVIDER_SITE_OTHER): Payer: Self-pay | Admitting: Family Medicine

## 2021-11-10 ENCOUNTER — Telehealth (INDEPENDENT_AMBULATORY_CARE_PROVIDER_SITE_OTHER): Payer: 59 | Admitting: Family Medicine

## 2021-11-10 DIAGNOSIS — E669 Obesity, unspecified: Secondary | ICD-10-CM | POA: Diagnosis not present

## 2021-11-10 DIAGNOSIS — Z6834 Body mass index (BMI) 34.0-34.9, adult: Secondary | ICD-10-CM | POA: Diagnosis not present

## 2021-11-10 DIAGNOSIS — E559 Vitamin D deficiency, unspecified: Secondary | ICD-10-CM

## 2021-11-10 DIAGNOSIS — F3289 Other specified depressive episodes: Secondary | ICD-10-CM | POA: Diagnosis not present

## 2021-11-10 MED ORDER — VITAMIN D (ERGOCALCIFEROL) 1.25 MG (50000 UNIT) PO CAPS: 50000.0000 [IU] | ORAL_CAPSULE | ORAL | 0 refills | Status: DC

## 2021-11-26 ENCOUNTER — Inpatient Hospital Stay: Payer: 59 | Admitting: Hematology and Oncology

## 2021-11-26 ENCOUNTER — Inpatient Hospital Stay: Payer: 59 | Attending: Hematology and Oncology

## 2021-11-26 ENCOUNTER — Encounter: Payer: Self-pay | Admitting: Hematology and Oncology

## 2021-12-01 ENCOUNTER — Encounter (INDEPENDENT_AMBULATORY_CARE_PROVIDER_SITE_OTHER): Payer: Self-pay | Admitting: Family Medicine

## 2021-12-01 ENCOUNTER — Ambulatory Visit (INDEPENDENT_AMBULATORY_CARE_PROVIDER_SITE_OTHER): Payer: 59 | Admitting: Family Medicine

## 2021-12-01 VITALS — BP 131/83 | HR 72 | Temp 97.9°F | Ht 66.0 in | Wt 210.0 lb

## 2021-12-01 DIAGNOSIS — Z6834 Body mass index (BMI) 34.0-34.9, adult: Secondary | ICD-10-CM | POA: Diagnosis not present

## 2021-12-01 DIAGNOSIS — F32A Depression, unspecified: Secondary | ICD-10-CM | POA: Insufficient documentation

## 2021-12-01 DIAGNOSIS — E559 Vitamin D deficiency, unspecified: Secondary | ICD-10-CM | POA: Diagnosis not present

## 2021-12-01 DIAGNOSIS — F3289 Other specified depressive episodes: Secondary | ICD-10-CM | POA: Diagnosis not present

## 2021-12-01 DIAGNOSIS — E669 Obesity, unspecified: Secondary | ICD-10-CM | POA: Diagnosis not present

## 2021-12-01 MED ORDER — VITAMIN D (ERGOCALCIFEROL) 1.25 MG (50000 UNIT) PO CAPS: 50000.0000 [IU] | ORAL_CAPSULE | ORAL | 0 refills | Status: DC

## 2021-12-01 MED ORDER — BUPROPION HCL ER (SR) 200 MG PO TB12: 200.0000 mg | ORAL_TABLET | Freq: Every morning | ORAL | 0 refills | Status: DC

## 2021-12-07 NOTE — Progress Notes (Signed)
Chief Complaint:   OBESITY Anna Jackson is here to discuss her progress with her obesity treatment plan along with follow-up of her obesity related diagnoses. Anna Jackson is on keeping a food journal and adhering to recommended goals of 1200 calories and 75+ grams of protein daily and states she is following her eating plan approximately 45% of the time. Anna Jackson states she is walking 3-4 daily for 15 minutes 7 times per week.  Today's visit was #: 11 Starting weight: 218 lbs Starting date: 03/18/2021 Today's weight: 210 lbs Today's date: 12/01/2021 Total lbs lost to date: 8 Total lbs lost since last in-office visit: 8  Interim History: Anna Jackson has done  very well with weight loss. She reports her hunger and cravings are controlled. She feels she meets hert protein needs most of the time.   Subjective:   1. Vitamin D deficiency Anna Jackson is taking Vitamin D 50,000 IU weekly, with no side effects noted.   2. Other depression, emotional eating behaviors Anna Jackson's Wellbutrin was increased at her last visit to 200 mg daily. No side effects were noted. She feels this has definitely helped to decrease cravings.   Assessment/Plan:   1. Vitamin D deficiency We will refill prescription Vitamin D for 90 days. We will recheck labs in 2 months. Anna Jackson will follow-up for routine testing of Vitamin D, at least 2-3 times per year to avoid over-replacement.  - Vitamin D, Ergocalciferol, (DRISDOL) 1.25 MG (50000 UNIT) CAPS capsule; Take 1 capsule (50,000 Units total) by mouth once a week.  Dispense: 12 capsule; Refill: 0  2. Other depression, emotional eating behaviors We will refill Wellbutrin SR 200 mg daily for 1 month. Anna Jackson will continue with her healthy eating plan and exercise.   - buPROPion (WELLBUTRIN SR) 200 MG 12 hr tablet; Take 1 tablet (200 mg total) by mouth every morning.  Dispense: 30 tablet; Refill: 0  3. Obesity, Current BMI 34.0 Anna Jackson is currently in the action stage of change. As such, her goal is to  continue with weight loss efforts. She has agreed to keeping a food journal and adhering to recommended goals of 1100-1300 calories and 75+ grams of protein daily.   Exercise goals: As is.   Behavioral modification strategies: increasing lean protein intake, decreasing simple carbohydrates, and no skipping meals.  Anna Jackson has agreed to follow-up with our clinic in 4 weeks. She was informed of the importance of frequent follow-up visits to maximize her success with intensive lifestyle modifications for her multiple health conditions.   Objective:   Blood pressure 131/83, pulse 72, temperature 97.9 F (36.6 C), height '5\' 6"'$  (1.676 m), weight 210 lb (95.3 kg), SpO2 98 %. Body mass index is 33.89 kg/m.  General: Cooperative, alert, well developed, in no acute distress. HEENT: Conjunctivae and lids unremarkable. Cardiovascular: Regular rhythm.  Lungs: Normal work of breathing. Neurologic: No focal deficits.   Lab Results  Component Value Date   CREATININE 0.76 08/12/2021   BUN 13 08/12/2021   NA 140 08/12/2021   K 4.1 08/12/2021   CL 103 08/12/2021   CO2 23 08/12/2021   Lab Results  Component Value Date   ALT 12 08/12/2021   AST 12 08/12/2021   ALKPHOS 128 (H) 08/12/2021   BILITOT 0.8 08/12/2021   Lab Results  Component Value Date   HGBA1C 5.4 08/12/2021   HGBA1C 5.4 02/20/2021   Lab Results  Component Value Date   INSULIN 9.3 08/12/2021   INSULIN 6.4 03/18/2021   Lab Results  Component Value Date   TSH 0.87 02/20/2021   Lab Results  Component Value Date   CHOL 189 08/12/2021   HDL 77 08/12/2021   LDLCALC 93 08/12/2021   LDLDIRECT 105.0 11/23/2010   TRIG 106 08/12/2021   CHOLHDL 1.8 02/20/2021   Lab Results  Component Value Date   VD25OH 40.2 08/12/2021   VD25OH 67 02/20/2021   VD25OH 22.00 (L) 12/31/2019   Lab Results  Component Value Date   WBC 5.2 07/01/2021   HGB 12.4 07/01/2021   HCT 37.7 07/01/2021   MCV 88.1 07/01/2021   PLT 286 07/01/2021    Lab Results  Component Value Date   IRON 79 02/20/2021   TIBC 330 02/20/2021   FERRITIN 53 02/20/2021   Attestation Statements:   Reviewed by clinician on day of visit: allergies, medications, problem list, medical history, surgical history, family history, social history, and previous encounter notes.   I, Trixie Dredge, am acting as transcriptionist for Dennard Nip, MD.  I have reviewed the above documentation for accuracy and completeness, and I agree with the above. -  Dennard Nip, MD

## 2021-12-28 ENCOUNTER — Telehealth (INDEPENDENT_AMBULATORY_CARE_PROVIDER_SITE_OTHER): Payer: 59 | Admitting: Family Medicine

## 2021-12-29 ENCOUNTER — Ambulatory Visit (INDEPENDENT_AMBULATORY_CARE_PROVIDER_SITE_OTHER): Payer: 59 | Admitting: Family Medicine

## 2022-01-02 ENCOUNTER — Other Ambulatory Visit: Payer: Self-pay | Admitting: Family Medicine

## 2022-01-02 DIAGNOSIS — I10 Essential (primary) hypertension: Secondary | ICD-10-CM

## 2022-01-02 DIAGNOSIS — F419 Anxiety disorder, unspecified: Secondary | ICD-10-CM

## 2022-01-13 ENCOUNTER — Ambulatory Visit (INDEPENDENT_AMBULATORY_CARE_PROVIDER_SITE_OTHER): Payer: 59 | Admitting: Family Medicine

## 2022-01-16 ENCOUNTER — Other Ambulatory Visit: Payer: Self-pay | Admitting: Family Medicine

## 2022-01-16 DIAGNOSIS — M8580 Other specified disorders of bone density and structure, unspecified site: Secondary | ICD-10-CM

## 2022-01-22 ENCOUNTER — Other Ambulatory Visit (HOSPITAL_BASED_OUTPATIENT_CLINIC_OR_DEPARTMENT_OTHER): Payer: Self-pay | Admitting: Family Medicine

## 2022-01-22 ENCOUNTER — Encounter: Payer: Self-pay | Admitting: Family Medicine

## 2022-01-22 ENCOUNTER — Ambulatory Visit (INDEPENDENT_AMBULATORY_CARE_PROVIDER_SITE_OTHER): Payer: 59 | Admitting: Family Medicine

## 2022-01-22 VITALS — BP 120/80 | HR 71 | Temp 98.0°F | Resp 18 | Ht 66.0 in | Wt 214.2 lb

## 2022-01-22 DIAGNOSIS — Z1231 Encounter for screening mammogram for malignant neoplasm of breast: Secondary | ICD-10-CM

## 2022-01-22 DIAGNOSIS — E559 Vitamin D deficiency, unspecified: Secondary | ICD-10-CM

## 2022-01-22 DIAGNOSIS — Z23 Encounter for immunization: Secondary | ICD-10-CM

## 2022-01-22 DIAGNOSIS — E538 Deficiency of other specified B group vitamins: Secondary | ICD-10-CM

## 2022-01-22 DIAGNOSIS — Z0000 Encounter for general adult medical examination without abnormal findings: Secondary | ICD-10-CM | POA: Diagnosis not present

## 2022-01-22 DIAGNOSIS — I10 Essential (primary) hypertension: Secondary | ICD-10-CM

## 2022-01-22 DIAGNOSIS — M8580 Other specified disorders of bone density and structure, unspecified site: Secondary | ICD-10-CM

## 2022-01-22 DIAGNOSIS — E7849 Other hyperlipidemia: Secondary | ICD-10-CM

## 2022-01-22 DIAGNOSIS — H538 Other visual disturbances: Secondary | ICD-10-CM

## 2022-01-22 NOTE — Progress Notes (Unsigned)
Subjective:     Anna Jackson is a 61 y.o. female and is here for a comprehensive physical exam. The patient reports no problems.  Social History   Socioeconomic History   Marital status: Significant Other    Spouse name: Not on file   Number of children: 1   Years of education: Not on file   Highest education level: Not on file  Occupational History   Occupation: RECEIVING CLERK    Employer: [employer]   Occupation: Animal nutritionist  Tobacco Use   Smoking status: Never   Smokeless tobacco: Never  Vaping Use   Vaping Use: Never used  Substance and Sexual Activity   Alcohol use: Yes    Comment: occasional   Drug use: No   Sexual activity: Not Currently    Partners: Male  Other Topics Concern   Not on file  Social History Narrative   Exercise-- walking   Social Determinants of Health   Financial Resource Strain: Not on file  Food Insecurity: Not on file  Transportation Needs: Not on file  Physical Activity: Not on file  Stress: Not on file  Social Connections: Not on file  Intimate Partner Violence: Not on file   Health Maintenance  Topic Date Due   Zoster Vaccines- Shingrix (1 of 2) Never done   MAMMOGRAM  01/18/2017   COVID-19 Vaccine (3 - Pfizer risk series) 07/18/2019   INFLUENZA VACCINE  09/15/2021   Hepatitis C Screening  05/14/2023 (Originally 11/14/1978)   COLONOSCOPY (Pts 45-20yr Insurance coverage will need to be confirmed)  11/12/2026   DTaP/Tdap/Td (5 - Td or Tdap) 12/10/2030   HIV Screening  Completed   HPV VACCINES  Aged Out    The following portions of the patient's history were reviewed and updated as appropriate: She  has a past medical history of Adenopathy, Anemia, Anxiety, Anxiety state, unspecified, Asthma, Barrett esophagus, Chest pain, Constipation, Depression, Esophageal reflux, GERD (gastroesophageal reflux disease), Headache (05/16/2013), Herpes simplex without mention of complication, Hyperlipidemia (03/12/2014), Hypertension,  Joint pain, [place]cell [place] (03/20/2013), Mental disorder, Neuropathic pain (04/04/2014), Non Hodgkin's lymphoma ([facility] (2015), Skin infection (11/02/2013), Unspecified asthma(493.90), Unspecified essential hypertension, URI (upper respiratory infection) (01/15/2014), and Vitamin D deficiency. She does not have any pertinent problems on file. She  has a past surgical history that includes Cholecystectomy (1995); Nissen fundoplication (13419; Cervical conization w/bx; Tubal ligation; Knee surgery (Right, 02/2010); Axillary lymph node dissection (Left, 03/09/2013); Portacath placement (N/A, 03/23/2013); Breast biopsy (Left, 02/13/2013); Breast excisional biopsy (Left, 03/09/2013); Esophageal manometry (N/A, 01/12/2017); Incisional hernia repair (2004); Vaginal hysterectomy (2002); and ORIF wrist fracture (Right, 12/25/2019). Her family history includes Alcoholism in her brother; Anxiety disorder in her mother; Breast cancer (age of onset: 458 in her maternal grandmother; Cancer (age of onset: 5108 in her maternal grandmother; Coronary artery disease in an other family member; Diabetes in her cousin; Heart disease in her brother, father, and mother; High Cholesterol in her father and mother; High blood pressure in her father and mother; Hypertension in an other family member; Migraines in an other family member. She  reports that she has never smoked. She has never used smokeless tobacco. She reports current alcohol use. She reports that she does not use drugs. She has a current medication list which includes the following prescription(s): alendronate, alprazolam, atorvastatin, docusate sodium, fluoxetine, hydrochlorothiazide, potassium chloride sa, and vitamin d (ergocalciferol). Current Outpatient Medications on File Prior to Visit  Medication Sig Dispense Refill   alendronate (FOSAMAX) 70 MG tablet TAKE  1 TABLET BY MOUTH ONCE A WEEK WITH  A  FULL  GLASS  OF  WATER  ON  AN  EMPTY  STOMACH 12 tablet 3    ALPRAZolam (XANAX) 0.25 MG tablet Take 1 tablet (0.25 mg total) by mouth 3 (three) times daily as needed for anxiety. 30 tablet 0   atorvastatin (LIPITOR) 20 MG tablet Take 1 tablet (20 mg total) by mouth daily. 90 tablet 1   docusate sodium (COLACE) 100 MG capsule Take 300 mg by mouth in the morning.      FLUoxetine (PROZAC) 40 MG capsule Take 1 capsule by mouth once daily 30 capsule 2   hydrochlorothiazide (HYDRODIURIL) 25 MG tablet Take 1 tablet by mouth once daily 30 tablet 2   potassium chloride SA (KLOR-CON M) 20 MEQ tablet Take 1 tablet by mouth once daily 60 tablet 2   Vitamin D, Ergocalciferol, (DRISDOL) 1.25 MG (50000 UNIT) CAPS capsule Take 1 capsule (50,000 Units total) by mouth once a week. 12 capsule 0   No current facility-administered medications on file prior to visit.   She is allergic to betadine [povidone iodine] and codeine..  Review of Systems Review of Systems  Constitutional: Negative for activity change, appetite change and fatigue.  HENT: Negative for hearing loss, congestion, tinnitus and ear discharge.  dentist q32mEyes: Negative for visual disturbance (see optho q1y -- vision corrected to 20/20 with glasses).  Respiratory: Negative for cough, chest tightness and shortness of breath.   Cardiovascular: Negative for chest pain, palpitations and leg swelling.  Gastrointestinal: Negative for abdominal pain, diarrhea, constipation and abdominal distention.  Genitourinary: Negative for urgency, frequency, decreased urine volume and difficulty urinating.  Musculoskeletal: Negative for back pain, arthralgias and gait problem.  Skin: Negative for color change, pallor and rash.  Neurological: Negative for dizziness, light-headedness, numbness and headaches.  Hematological: Negative for adenopathy. Does not bruise/bleed easily.  Psychiatric/Behavioral: Negative for suicidal ideas, confusion, sleep disturbance, self-injury, dysphoric mood, decreased concentration and  agitation.      Objective:    BP 120/80 (BP Location: Left Arm, Patient Position: Sitting, Cuff Size: Large)   Pulse 71   Temp 98 F (36.7 C) (Oral)   Resp 18   Ht '5\' 6"'$  (1.676 m)   Wt 214 lb 3.2 oz (97.2 kg)   SpO2 97%   BMI 34.57 kg/m  General appearance: alert, cooperative, and appears stated age Head: Normocephalic, without obvious abnormality, atraumatic Eyes: conjunctivae/corneas clear. PERRL, EOM's intact. Fundi benign. Ears: normal TM's and external ear canals both ears Nose: Nares normal. Septum midline. Mucosa normal. No drainage or sinus tenderness. Throat: lips, mucosa, and tongue normal; teeth and gums normal Neck: no adenopathy, no carotid bruit, no JVD, supple, symmetrical, trachea midline, and thyroid not enlarged, symmetric, no tenderness/mass/nodules Back: symmetric, no curvature. ROM normal. No CVA tenderness. Lungs: clear to auscultation bilaterally Heart: regular rate and rhythm, S1, S2 normal, no murmur, click, rub or gallop Abdomen: soft, non-tender; bowel sounds normal; no masses,  no organomegaly Extremities: extremities normal, atraumatic, no cyanosis or edema Pulses: 2+ and symmetric Skin: Skin color, texture, turgor normal. No rashes or lesions Lymph nodes: Cervical, supraclavicular, and axillary nodes normal. Neurologic: Alert and oriented X 3, normal strength and tone. Normal symmetric reflexes. Normal coordination and gait    Assessment:    Healthy female exam.    Plan:    Ghm utd Check labs  See After Visit Summary for Counseling Recommendations \   1. Need for influenza vaccination  -  Flu Vaccine QUAD 6+ mos PF IM (Fluarix Quad PF)  2. Osteopenia, unspecified location  - DG Bone Density; Future  3. Vitamin D deficiency  - Vitamin B12 - VITAMIN D 25 Hydroxy (Vit-D Deficiency, Fractures)  4. Essential hypertension Well controlled, no changes to meds. Encouraged heart healthy diet such as the DASH diet and exercise as tolerated.    - CBC with Differential/Platelet - Comprehensive metabolic panel - Lipid panel - TSH  5. Other hyperlipidemia Encourage heart healthy diet such as MIND or DASH diet, increase exercise, avoid trans fats, simple carbohydrates and processed foods, consider a krill or fish or flaxseed oil cap daily.   - CBC with Differential/Platelet - Comprehensive metabolic panel - Lipid panel - TSH  6. B12 deficiency   - Vitamin B12 - VITAMIN D 25 Hydroxy (Vit-D Deficiency, Fractures)  7. Primary hypertension   - CBC with Differential/Platelet - Comprehensive metabolic panel - Lipid panel - TSH  8. Blurry vision, bilateral   - Ambulatory referral to Ophthalmology

## 2022-01-23 LAB — COMPREHENSIVE METABOLIC PANEL
AG Ratio: 1.6 (calc) (ref 1.0–2.5)
ALT: 25 U/L (ref 6–29)
AST: 19 U/L (ref 10–35)
Albumin: 4.1 g/dL (ref 3.6–5.1)
Alkaline phosphatase (APISO): 127 U/L (ref 37–153)
BUN: 8 mg/dL (ref 7–25)
CO2: 28 mmol/L (ref 20–32)
Calcium: 9 mg/dL (ref 8.6–10.4)
Chloride: 102 mmol/L (ref 98–110)
Creat: 0.79 mg/dL (ref 0.50–1.03)
Globulin: 2.5 g/dL (calc) (ref 1.9–3.7)
Glucose, Bld: 89 mg/dL (ref 65–99)
Potassium: 3.2 mmol/L — ABNORMAL LOW (ref 3.5–5.3)
Sodium: 141 mmol/L (ref 135–146)
Total Bilirubin: 0.8 mg/dL (ref 0.2–1.2)
Total Protein: 6.6 g/dL (ref 6.1–8.1)

## 2022-01-23 LAB — CBC WITH DIFFERENTIAL/PLATELET
Absolute Monocytes: 484 cells/uL (ref 200–950)
Basophils Absolute: 47 cells/uL (ref 0–200)
Basophils Relative: 0.8 %
Eosinophils Absolute: 100 cells/uL (ref 15–500)
Eosinophils Relative: 1.7 %
HCT: 38.2 % (ref 35.0–45.0)
Hemoglobin: 12.9 g/dL (ref 11.7–15.5)
Lymphs Abs: 1900 cells/uL (ref 850–3900)
MCH: 29.6 pg (ref 27.0–33.0)
MCHC: 33.8 g/dL (ref 32.0–36.0)
MCV: 87.6 fL (ref 80.0–100.0)
MPV: 10 fL (ref 7.5–12.5)
Monocytes Relative: 8.2 %
Neutro Abs: 3369 cells/uL (ref 1500–7800)
Neutrophils Relative %: 57.1 %
Platelets: 317 10*3/uL (ref 140–400)
RBC: 4.36 10*6/uL (ref 3.80–5.10)
RDW: 13.3 % (ref 11.0–15.0)
Total Lymphocyte: 32.2 %
WBC: 5.9 10*3/uL (ref 3.8–10.8)

## 2022-01-23 LAB — LIPID PANEL
Cholesterol: 205 mg/dL — ABNORMAL HIGH (ref <200)
HDL: 95 mg/dL (ref >=50)
LDL Cholesterol (Calc): 92 mg/dL (calc)
Non-HDL Cholesterol (Calc): 110 mg/dL (calc) (ref <130)
Total CHOL/HDL Ratio: 2.2 (calc) (ref <5.0)
Triglycerides: 89 mg/dL (ref <150)

## 2022-01-23 LAB — VITAMIN B12: Vitamin B-12: 556 pg/mL (ref 200–1100)

## 2022-01-23 LAB — VITAMIN D 25 HYDROXY (VIT D DEFICIENCY, FRACTURES): Vit D, 25-Hydroxy: 40 ng/mL (ref 30–100)

## 2022-01-23 LAB — TSH: TSH: 1.17 mIU/L (ref 0.40–4.50)

## 2022-02-02 ENCOUNTER — Ambulatory Visit (INDEPENDENT_AMBULATORY_CARE_PROVIDER_SITE_OTHER): Payer: 59 | Admitting: Family Medicine

## 2022-02-09 ENCOUNTER — Other Ambulatory Visit (HOSPITAL_BASED_OUTPATIENT_CLINIC_OR_DEPARTMENT_OTHER): Payer: Self-pay

## 2022-02-09 ENCOUNTER — Encounter: Payer: Self-pay | Admitting: Family Medicine

## 2022-02-09 ENCOUNTER — Ambulatory Visit (INDEPENDENT_AMBULATORY_CARE_PROVIDER_SITE_OTHER): Payer: 59 | Admitting: Family Medicine

## 2022-02-09 VITALS — BP 127/81 | HR 77 | Temp 98.0°F | Resp 16 | Ht 68.0 in | Wt 212.2 lb

## 2022-02-09 DIAGNOSIS — J020 Streptococcal pharyngitis: Secondary | ICD-10-CM

## 2022-02-09 DIAGNOSIS — R059 Cough, unspecified: Secondary | ICD-10-CM | POA: Diagnosis not present

## 2022-02-09 DIAGNOSIS — J029 Acute pharyngitis, unspecified: Secondary | ICD-10-CM | POA: Diagnosis not present

## 2022-02-09 LAB — POCT INFLUENZA A/B
Influenza A, POC: NEGATIVE
Influenza B, POC: NEGATIVE

## 2022-02-09 LAB — POC COVID19 BINAXNOW: SARS Coronavirus 2 Ag: NEGATIVE

## 2022-02-09 LAB — POCT RAPID STREP A (OFFICE): Rapid Strep A Screen: POSITIVE — AB

## 2022-02-09 MED ORDER — AMOXICILLIN 500 MG PO CAPS: 500.0000 mg | ORAL_CAPSULE | Freq: Two times a day (BID) | ORAL | 0 refills | Status: DC

## 2022-02-09 MED ORDER — AMOXICILLIN 500 MG PO CAPS
500.0000 mg | ORAL_CAPSULE | Freq: Two times a day (BID) | ORAL | 0 refills | Status: DC
  Filled 2022-02-09: qty 20, 10d supply, fill #0

## 2022-02-09 NOTE — Patient Instructions (Addendum)
Strep positive Flu and COVID negative Sending in amoxicillin Continue supportive measures including rest, hydration, humidifier use, steam showers, warm compresses to sinuses, warm liquids with lemon and honey, and over-the-counter cough, cold, and analgesics as needed.   Please contact office for follow-up if symptoms do not improve or worsen. Seek emergency care if symptoms become severe.

## 2022-02-09 NOTE — Progress Notes (Signed)
Acute Office Visit  Subjective:     Patient ID: Anna Jackson, female    DOB: Jul 28, 1960, 61 y.o.   MRN: [id number]  Chief Complaint  Patient presents with   Sore Throat   Cough   Nasal Congestion    HPI  Upper Respiratory Infection: Patient complains of symptoms of a URI. Symptoms include congestion, cough, fever, sore throat, swollen glands, and hoarse voice, productive cough, fatigue, body aches, chills, feverish . Onset of symptoms was 5 days ago, unchanged since that time. .  She is drinking plenty of fluids. Evaluation to date: none. Treatment to date:  Tylenol Cold & Flu, hydration . She denies chest pain, dyspnea, wheezing, nausea, vomiting, diarrhea. No known sick exposures.     ROS All review of systems negative except what is listed in the HPI      Objective:    BP 127/81   Pulse 77   Temp 98 F (36.7 C)   Resp 16   Ht '5\' 8"'$  (1.727 m)   Wt 212 lb 3.2 oz (96.3 kg)   SpO2 93%   BMI 32.26 kg/m    Physical Exam Vitals reviewed.  Constitutional:      Appearance: Normal appearance.  HENT:     Head: Normocephalic and atraumatic.     Nose: Congestion present.     Mouth/Throat:     Mouth: Mucous membranes are moist.     Pharynx: Oropharynx is clear. No pharyngeal swelling or posterior oropharyngeal erythema.     Tonsils: No tonsillar exudate or tonsillar abscesses.  Eyes:     Conjunctiva/sclera: Conjunctivae normal.  Cardiovascular:     Rate and Rhythm: Normal rate and regular rhythm.     Pulses: Normal pulses.     Heart sounds: Normal heart sounds.  Pulmonary:     Effort: Pulmonary effort is normal.     Breath sounds: Normal breath sounds.  Skin:    General: Skin is warm and dry.  Neurological:     Mental Status: She is alert and oriented to person, place, and time.  Psychiatric:        Mood and Affect: Mood normal.        Behavior: Behavior normal.        Thought Content: Thought content normal.        Judgment: Judgment normal.       Results for orders placed or performed in visit on 02/09/22  POCT Influenza A/B  Result Value Ref Range   Influenza A, POC Negative Negative   Influenza B, POC Negative Negative  POC COVID-19  Result Value Ref Range   SARS Coronavirus 2 Ag Negative Negative  POCT rapid strep A  Result Value Ref Range   Rapid Strep A Screen Positive (A) Negative        Assessment & Plan:   Problem List Items Addressed This Visit   None Visit Diagnoses     Sore throat    -  Primary   Relevant Orders   POCT Influenza A/B (Completed)   POC COVID-19 (Completed)   POCT rapid strep A (Completed)   Cough, unspecified type       Relevant Orders   POCT Influenza A/B (Completed)   POC COVID-19 (Completed)   POCT rapid strep A (Completed)   Strep pharyngitis       Relevant Medications   amoxicillin (AMOXIL) 500 MG capsule      Strep positive Flu and COVID negative Sending in amoxicillin Continue supportive measures  including rest, hydration, humidifier use, steam showers, warm compresses to sinuses, warm liquids with lemon and honey, and over-the-counter cough, cold, and analgesics as needed.   Please contact office for follow-up if symptoms do not improve or worsen. Seek emergency care if symptoms become severe.    Meds ordered this encounter  Medications   DISCONTD: amoxicillin (AMOXIL) 500 MG capsule    Sig: Take 1 capsule (500 mg total) by mouth 2 (two) times daily for 10 days.    Dispense:  20 capsule    Refill:  0    Order Specific Question:   Supervising Provider    Answer:   Penni Homans A [[id number]]   amoxicillin (AMOXIL) 500 MG capsule    Sig: Take 1 capsule (500 mg total) by mouth 2 (two) times daily for 10 days.    Dispense:  20 capsule    Refill:  0    Order Specific Question:   Supervising Provider    Answer:   Penni Homans A [[id number]]    Return if symptoms worsen or fail to improve.  Terrilyn Saver, NP

## 2022-02-12 ENCOUNTER — Encounter: Payer: Self-pay | Admitting: Family Medicine

## 2022-02-12 ENCOUNTER — Other Ambulatory Visit (HOSPITAL_BASED_OUTPATIENT_CLINIC_OR_DEPARTMENT_OTHER): Payer: Self-pay

## 2022-02-12 ENCOUNTER — Ambulatory Visit: Payer: 59 | Admitting: Family Medicine

## 2022-02-12 VITALS — BP 130/80 | HR 71 | Temp 98.6°F | Resp 18 | Ht 68.0 in | Wt 212.0 lb

## 2022-02-12 DIAGNOSIS — J40 Bronchitis, not specified as acute or chronic: Secondary | ICD-10-CM

## 2022-02-12 MED ORDER — AMOXICILLIN-POT CLAVULANATE 875-125 MG PO TABS
1.0000 | ORAL_TABLET | Freq: Two times a day (BID) | ORAL | 0 refills | Status: DC
  Filled 2022-02-12: qty 20, 10d supply, fill #0

## 2022-02-12 MED ORDER — ALBUTEROL SULFATE HFA 108 (90 BASE) MCG/ACT IN AERS
2.0000 | INHALATION_SPRAY | Freq: Four times a day (QID) | RESPIRATORY_TRACT | 2 refills | Status: DC | PRN
  Filled 2022-02-12: qty 6.7, 20d supply, fill #0

## 2022-02-12 MED ORDER — PREDNISONE 10 MG PO TABS
ORAL_TABLET | ORAL | 0 refills | Status: DC
  Filled 2022-02-12: qty 20, 12d supply, fill #0

## 2022-02-12 MED ORDER — PROMETHAZINE-DM 6.25-15 MG/5ML PO SYRP
5.0000 mL | ORAL_SOLUTION | Freq: Four times a day (QID) | ORAL | 0 refills | Status: DC | PRN
  Filled 2022-02-12: qty 118, 6d supply, fill #0

## 2022-02-12 NOTE — Patient Instructions (Signed)

## 2022-02-12 NOTE — Progress Notes (Signed)
Subjective:   By signing my name below, I, Luna Glasgow, attest that this documentation has been prepared under the direction and in the presence of Roma Schanz, 02/12/2022.    Patient ID: Anna Jackson, female    DOB: [birth year], 61 y.o.   MRN: [id number]  No chief complaint on file.   HPI Patient is in today for an office visit.  Cough Patient reports that her symptoms started 12/21 with coughing, sore throat, fever, and fatigue. She reports that she is no longer experiencing a sore throat and fever, but is experiencing chest congestion and wheezing. She states that at night she feels as though she is draining in mucous and can't cough it up. Patient is requesting a doctors' note for the week because she hasn't felt good.  Health Maintenance Due  Topic Date Due  . Zoster Vaccines- Shingrix (1 of 2) Never done  . MAMMOGRAM  01/18/2017  . COVID-19 Vaccine (3 - Pfizer risk series) 07/18/2019    Past Medical History:  Diagnosis Date  . Adenopathy    left axillary  . Anemia   . Anxiety   . Anxiety state, unspecified   . Asthma    no problem with asthma in several years  . Barrett esophagus   . Chest pain   . Constipation   . Depression   . Esophageal reflux   . GERD (gastroesophageal reflux disease)   . Headache 05/16/2013  . Herpes simplex without mention of complication   . Hyperlipidemia 03/12/2014  . Hypertension   . Joint pain   . 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 Mental disorder   . Neuropathic pain 04/04/2014  . Non Hodgkin's lymphoma ([place]) 2015  . Skin infection 11/02/2013  . Unspecified asthma(493.90)   . Unspecified essential hypertension   . URI (upper respiratory infection) 01/15/2014  . Vitamin D deficiency     Past Surgical History:  Procedure Laterality Date  . AXILLARY LYMPH NODE DISSECTION Left 03/09/2013   Procedure: EXCISION LEFT AXILLARY LYMPH NODES;  Surgeon: [name], MD;  Location: [facility];  Service: General;   Laterality: Left;  . BREAST BIOPSY Left 02/13/2013   high risk  . BREAST EXCISIONAL BIOPSY Left 03/09/2013   high risk  . CERVICAL CONIZATION W/BX    . CHOLECYSTECTOMY  1995   Open, done with Hiatal hernia repair  . ESOPHAGEAL MANOMETRY N/A 01/12/2017   Procedure: ESOPHAGEAL MANOMETRY (EM);  Surgeon: Doran Stabler, MD;  Location: [facility];  Service: Gastroenterology;  Laterality: N/A;  . INCISIONAL HERNIA REPAIR  2004   Dr Nedra Hai.  Parietex 20x15cm mesh  . KNEE SURGERY Right 02/2010   Dr Shellia Carwin, Arthroscopic  . NISSEN FUNDOPLICATION  2297   OPEN, Dr Lennie Hummer  . ORIF WRIST FRACTURE Right 12/25/2019   Procedure: #1 right wrist/radius open reduction internal fixation comminuted complex distal radius fracture with DVR Biomet cross lock plate and screw construct.  This was a greater than 3 part intra-articular fracture.  #2 AP lateral and oblique x-rays performed examined and interpreted by myself #3 evaluation anesthesia ulnar styloid fracture with subsequent closed treatment based upon stability   . PORTACATH PLACEMENT N/A 03/23/2013   Procedure: INSERTION PORT-A-CATH;  Surgeon: [name], MD;  Location: [facility];  Service: General;  Laterality: N/A;  . TUBAL LIGATION    . VAGINAL HYSTERECTOMY  2002   Dr Art Raphael Gibney    Family History  Problem Relation Age of Onset  . Heart  disease Mother   . High blood pressure Mother   . High Cholesterol Mother   . Anxiety disorder Mother   . Heart disease Father   . High Cholesterol Father   . High blood pressure Father   . Heart disease Brother   . Alcoholism Brother   . Breast cancer Maternal Grandmother 88  . Cancer Maternal Grandmother 50       breast  . Diabetes Cousin   . Coronary artery disease Other        with hernia repair  . Migraines Other   . Hypertension Other   . Colon cancer Neg Hx     Social History   Socioeconomic History  . Marital status: Significant Other    Spouse name: Not on file  .  Number of children: 1  . Years of education: Not on file  . Highest education level: Not on file  Occupational History  . Occupation: Biochemist, clinical: [employer]  . Occupation: Animal nutritionist  Tobacco Use  . Smoking status: Never  . Smokeless tobacco: Never  Vaping Use  . Vaping Use: Never used  Substance and Sexual Activity  . Alcohol use: Yes    Comment: occasional  . Drug use: No  . Sexual activity: Not Currently    Partners: Male  Other Topics Concern  . Not on file  Social History Narrative   Exercise-- walking   Social Determinants of Health   Financial Resource Strain: Not on file  Food Insecurity: Not on file  Transportation Needs: Not on file  Physical Activity: Not on file  Stress: Not on file  Social Connections: Not on file  Intimate Partner Violence: Not on file    Outpatient Medications Prior to Visit  Medication Sig Dispense Refill  . alendronate (FOSAMAX) 70 MG tablet TAKE 1 TABLET BY MOUTH ONCE A WEEK WITH  A  FULL  GLASS  OF  WATER  ON  AN  EMPTY  STOMACH 12 tablet 3  . ALPRAZolam (XANAX) 0.25 MG tablet Take 1 tablet (0.25 mg total) by mouth 3 (three) times daily as needed for anxiety. 30 tablet 0  . amoxicillin (AMOXIL) 500 MG capsule Take 1 capsule (500 mg total) by mouth 2 (two) times daily for 10 days. 20 capsule 0  . atorvastatin (LIPITOR) 20 MG tablet Take 1 tablet (20 mg total) by mouth daily. 90 tablet 1  . docusate sodium (COLACE) 100 MG capsule Take 300 mg by mouth in the morning.     Marland Kitchen FLUoxetine (PROZAC) 40 MG capsule Take 1 capsule by mouth once daily 30 capsule 2  . hydrochlorothiazide (HYDRODIURIL) 25 MG tablet Take 1 tablet by mouth once daily 30 tablet 2  . potassium chloride SA (KLOR-CON M) 20 MEQ tablet Take 1 tablet by mouth once daily 60 tablet 2  . Vitamin D, Ergocalciferol, (DRISDOL) 1.25 MG (50000 UNIT) CAPS capsule Take 1 capsule (50,000 Units total) by mouth once a week. 12 capsule 0   No facility-administered  medications prior to visit.    Allergies  Allergen Reactions  . Betadine [Povidone Iodine] Hives and Itching  . Codeine Nausea And Vomiting    Review of Systems  Constitutional:  Positive for malaise/fatigue. Negative for fever.  HENT:  Positive for congestion (chest).   Eyes:  Negative for blurred vision.  Respiratory:  Positive for cough and wheezing. Negative for shortness of breath.   Cardiovascular:  Negative for chest pain, palpitations and leg swelling.  Gastrointestinal:  Negative for vomiting.  Musculoskeletal:  Negative for back pain.  Skin:  Negative for rash.  Neurological:  Negative for loss of consciousness and headaches.       Objective:    Physical Exam Vitals and nursing note reviewed.  Constitutional:      General: She is not in acute distress.    Appearance: Normal appearance. She is not ill-appearing.  HENT:     Head: Normocephalic and atraumatic.     Right Ear: External ear normal.     Left Ear: External ear normal.     Mouth/Throat:     Pharynx: Posterior oropharyngeal erythema present.  Eyes:     Extraocular Movements: Extraocular movements intact.     Pupils: Pupils are equal, round, and reactive to light.  Cardiovascular:     Rate and Rhythm: Normal rate and regular rhythm.     Heart sounds: Normal heart sounds. No murmur heard.    No gallop.  Pulmonary:     Effort: Pulmonary effort is normal. No respiratory distress.     Breath sounds: Normal breath sounds. No wheezing or rales.  Lymphadenopathy:     Cervical: Cervical adenopathy present.     Right cervical: Superficial cervical adenopathy present.     Left cervical: Superficial cervical adenopathy present.  Skin:    General: Skin is warm and dry.  Neurological:     Mental Status: She is alert and oriented to person, place, and time.  Psychiatric:        Judgment: Judgment normal.    There were no vitals taken for this visit. Wt Readings from Last 3 Encounters:  02/09/22 212 lb 3.2  oz (96.3 kg)  01/22/22 214 lb 3.2 oz (97.2 kg)  12/01/21 210 lb (95.3 kg)       Assessment & Plan:   Problem List Items Addressed This Visit   None  No orders of the defined types were placed in this encounter.   I, Roma Schanz, personally preformed the services described in this documentation.  All medical record entries made by the scribe were at my direction and in my presence.  I have reviewed the chart and discharge instructions (if applicable) and agree that the record reflects my personal performance and is accurate and complete. 02/12/2022.   I,Verona Buck,acting as a Education administrator for [employer], DO.,have documented all relevant documentation on the behalf of Ann Held, DO,as directed by  Ann Held, DO while in the presence of Ann Held, DO.    Luna Glasgow

## 2022-02-12 NOTE — Assessment & Plan Note (Signed)
D/c amoxil and start augmentin  Phenergan dm Pred taper  If no relief--- cxxr

## 2022-02-22 ENCOUNTER — Encounter (HOSPITAL_BASED_OUTPATIENT_CLINIC_OR_DEPARTMENT_OTHER): Payer: Self-pay

## 2022-02-22 ENCOUNTER — Ambulatory Visit (HOSPITAL_BASED_OUTPATIENT_CLINIC_OR_DEPARTMENT_OTHER)
Admission: RE | Admit: 2022-02-22 | Discharge: 2022-02-22 | Disposition: A | Discharge status: Home / Self Care | Payer: 59 | Source: Ambulatory Visit | Attending: Family Medicine | Admitting: Family Medicine

## 2022-02-22 DIAGNOSIS — M8580 Other specified disorders of bone density and structure, unspecified site: Secondary | ICD-10-CM | POA: Diagnosis present

## 2022-02-22 DIAGNOSIS — Z1231 Encounter for screening mammogram for malignant neoplasm of breast: Secondary | ICD-10-CM | POA: Diagnosis present

## 2022-04-08 ENCOUNTER — Other Ambulatory Visit: Payer: Self-pay | Admitting: Family Medicine

## 2022-04-08 DIAGNOSIS — E785 Hyperlipidemia, unspecified: Secondary | ICD-10-CM

## 2022-07-05 ENCOUNTER — Other Ambulatory Visit: Payer: Self-pay | Admitting: Family Medicine

## 2022-07-05 DIAGNOSIS — F419 Anxiety disorder, unspecified: Secondary | ICD-10-CM

## 2022-07-05 DIAGNOSIS — I10 Essential (primary) hypertension: Secondary | ICD-10-CM

## 2022-08-17 ENCOUNTER — Other Ambulatory Visit: Payer: Self-pay | Admitting: Family Medicine

## 2022-08-17 DIAGNOSIS — I10 Essential (primary) hypertension: Secondary | ICD-10-CM

## 2022-08-27 ENCOUNTER — Ambulatory Visit: Payer: 59 | Admitting: Family Medicine

## 2022-08-27 ENCOUNTER — Encounter: Payer: Self-pay | Admitting: Family Medicine

## 2022-08-27 VITALS — BP 110/82 | HR 65 | Temp 98.2°F | Resp 18 | Ht 68.0 in | Wt 219.4 lb

## 2022-08-27 DIAGNOSIS — I10 Essential (primary) hypertension: Secondary | ICD-10-CM

## 2022-08-27 DIAGNOSIS — E538 Deficiency of other specified B group vitamins: Secondary | ICD-10-CM

## 2022-08-27 DIAGNOSIS — E7849 Other hyperlipidemia: Secondary | ICD-10-CM

## 2022-08-27 DIAGNOSIS — F419 Anxiety disorder, unspecified: Secondary | ICD-10-CM

## 2022-08-27 DIAGNOSIS — Z23 Encounter for immunization: Secondary | ICD-10-CM | POA: Diagnosis not present

## 2022-08-27 DIAGNOSIS — Z6835 Body mass index (BMI) 35.0-35.9, adult: Secondary | ICD-10-CM

## 2022-08-27 DIAGNOSIS — E559 Vitamin D deficiency, unspecified: Secondary | ICD-10-CM | POA: Diagnosis not present

## 2022-08-27 MED ORDER — ALPRAZOLAM 0.25 MG PO TABS: 0.2500 mg | ORAL_TABLET | Freq: Three times a day (TID) | ORAL | 1 refills | Status: DC | PRN

## 2022-08-27 NOTE — Progress Notes (Signed)
Established Patient Office Visit  Subjective   Patient ID: Anna Jackson, female    DOB: [birth year]  Age: 62 y.o. MRN: [id number]  Chief Complaint  Patient presents with   Follow-up    Concerns/ questions: follow up on some previous discussions from before.  Shingrix: pt would like to start series today    HPI Discussed the use of AI scribe software for clinical note transcription with the patient, who gave verbal consent to proceed.  History of Present Illness   The patient presents with recurrent episodes of profuse sweating, heat intolerance, and nervousness, which she describes as 'almost like a panic attack.' These episodes are unpredictable, occurring sporadically and varying in frequency from multiple times a week to weeks without an episode. The patient reports feeling 'so bad' during these episodes, which are associated with fatigue. She has been managing these symptoms with Xanax. The patient has previously been told that she could be hypoglycemic, and she has noted low blood sugar readings in the 60s during these episodes.  The patient also reports a history of shingles during their cancer treatment and is interested in receiving the Shingrix vaccine. She has not had the shingles vaccine before.      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   Major depressive disorder with single episode, in full remission (HCC) 12/09/2020   Closed fracture of distal end of right radius 12/26/2019   Right wrist fracture 12/25/2019   Angina at rest 08/02/2019   Hypokalemia 07/28/2019   Chest pain, rule out acute myocardial infarction 07/27/2019   Recurrent hiatal hernia s/p robotic PEH  repair/Nissen 03/16/2017 03/16/2017   Hiatal hernia    Acute bronchitis 06/14/2016   Vitamin B12 deficiency 10/10/2014   Neuropathic pain 04/04/2014   Hyperlipidemia 03/12/2014   Obesity (BMI 30-39.9) 01/08/2014   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   Headache 05/16/2013   History of lymphoma 03/20/2013   Obesity, morbid (HCC)    Barrett's esophagus 08/10/2010   Heme positive stool 06/30/2010   Depression, major, single episode, in partial remission (HCC) 03/04/2009   GERD 12/08/2007   POSTMENOPAUSAL STATUS 11/21/2007   Anxiety state 05/09/2007   Pain in joint, lower leg 10/20/2006   HERPES SIMPLEX, UNCOMPLICATED 05/27/2006   ONYCHOMYCOSIS, TOENAILS 05/27/2006   Essential hypertension 04/26/2006   Asthma 04/26/2006   HEADACHE 04/26/2006   UMBILICAL HERNIORRHAPHY, HX OF 04/26/2006   Past Medical History:  Diagnosis Date   Adenopathy    left axillary   Anemia    Anxiety    Anxiety state, unspecified    Asthma    no problem with asthma in several years   Barrett esophagus    Chest pain    Constipation    Depression    Esophageal reflux    GERD (gastroesophageal reflux disease)    Headache 05/16/2013   Herpes simplex without mention of complication    Hyperlipidemia 03/12/2014   Hypertension    Joint pain    Lymphoma, 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   Mental disorder    Neuropathic pain 04/04/2014   Non Hodgkin's lymphoma (HCC) 2015   Skin infection 11/02/2013   Unspecified asthma(493.90)  Unspecified essential hypertension    URI (upper respiratory infection) 01/15/2014   Vitamin D deficiency    Past Surgical History:  Procedure Laterality Date   AXILLARY LYMPH NODE DISSECTION Left 03/09/2013   Procedure: EXCISION LEFT AXILLARY LYMPH NODES;  Surgeon: Ernestene Mention, MD;  Location: [facility];  Service: General;  Laterality: Left;   BREAST BIOPSY Left 02/13/2013   high risk   BREAST EXCISIONAL BIOPSY Left 03/09/2013   high risk   CERVICAL  CONIZATION W/BX     CHOLECYSTECTOMY  1995   Open, done with Hiatal hernia repair   ESOPHAGEAL MANOMETRY N/A 01/12/2017   Procedure: ESOPHAGEAL MANOMETRY (EM);  Surgeon: Sherrilyn Rist, MD;  Location: [facility];  Service: Gastroenterology;  Laterality: N/A;   INCISIONAL HERNIA REPAIR  2004   Dr Carman Ching.  Parietex 20x15cm mesh   KNEE SURGERY Right 02/2010   Dr Simonne Come, Arthroscopic   NISSEN FUNDOPLICATION  1995   OPEN, Dr Kendrick Ranch   ORIF WRIST FRACTURE Right 12/25/2019   Procedure: #1 right wrist/radius open reduction internal fixation comminuted complex distal radius fracture with DVR Biomet cross lock plate and screw construct.  This was a greater than 3 part intra-articular fracture.  #2 AP lateral and oblique x-rays performed examined and interpreted by myself #3 evaluation anesthesia ulnar styloid fracture with subsequent closed treatment based upon stability    PORTACATH PLACEMENT N/A 03/23/2013   Procedure: INSERTION PORT-A-CATH;  Surgeon: Ernestene Mention, MD;  Location: [facility] OR;  Service: General;  Laterality: N/A;   TUBAL LIGATION     VAGINAL HYSTERECTOMY  2002   Dr Art Stefano Gaul   Social History   Tobacco Use   Smoking status: Never   Smokeless tobacco: Never  Vaping Use   Vaping status: Never Used  Substance Use Topics   Alcohol use: Yes    Comment: occasional   Drug use: No   Social History   Socioeconomic History   Marital status: Significant Other    Spouse name: Not on file   Number of children: 1   Years of education: Not on file   Highest education level: Not on file  Occupational History   Occupation: RECEIVING CLERK    Employer: [employer]   Occupation: Armed forces operational officer  Tobacco Use   Smoking status: Never   Smokeless tobacco: Never  Vaping Use   Vaping status: Never Used  Substance and Sexual Activity   Alcohol use: Yes    Comment: occasional   Drug use: No   Sexual activity: Not Currently    Partners: Male  Other Topics Concern    Not on file  Social History Narrative   Exercise-- walking   Social Determinants of Health   Financial Resource Strain: Not on file  Food Insecurity: Not on file  Transportation Needs: Not on file  Physical Activity: Not on file  Stress: Not on file  Social Connections: Not on file  Intimate Partner Violence: Not on file   Family Status  Relation Name Status   Mother  Deceased   Father  Deceased   Brother  Deceased at age 48   MGM  (Not Specified)   Cousin  Alive   Other  (Not Specified)   Other  (Not Specified)   Other  (Not Specified)   Neg Hx  (Not Specified)  No partnership data on file   Family History  Problem Relation Age of Onset   Heart disease Mother    High blood pressure Mother  High Cholesterol Mother    Anxiety disorder Mother    Heart disease Father    High Cholesterol Father    High blood pressure Father    Heart disease Brother    Alcoholism Brother    Breast cancer Maternal Grandmother 61   Cancer Maternal Grandmother 71       breast   Diabetes Cousin    Coronary artery disease Other        with hernia repair   Migraines Other    Hypertension Other    Colon cancer Neg Hx    Allergies  Allergen Reactions   Betadine [Povidone Iodine] Hives and Itching   Codeine Nausea And Vomiting      Review of Systems  Constitutional:  Negative for chills, fever and malaise/fatigue.  HENT:  Negative for congestion and hearing loss.   Eyes:  Negative for blurred vision and discharge.  Respiratory:  Negative for cough, sputum production and shortness of breath.   Cardiovascular:  Negative for chest pain, palpitations and leg swelling.  Gastrointestinal:  Negative for abdominal pain, blood in stool, constipation, diarrhea, heartburn, nausea and vomiting.  Genitourinary:  Negative for dysuria, frequency, hematuria and urgency.  Musculoskeletal:  Negative for back pain, falls and myalgias.  Skin:  Negative for rash.  Neurological:  Negative for  dizziness, sensory change, loss of consciousness, weakness and headaches.  Endo/Heme/Allergies:  Negative for environmental allergies. Does not bruise/bleed easily.  Psychiatric/Behavioral:  Negative for depression and suicidal ideas. The patient is not nervous/anxious and does not have insomnia.       Objective:     BP 110/82 (BP Location: Left Arm, Patient Position: Sitting, Cuff Size: Large)   Pulse 65   Temp 98.2 F (36.8 C) (Temporal)   Resp 18   Ht 5\' 8"  (1.727 m)   Wt 219 lb 6.4 oz (99.5 kg)   SpO2 98%   BMI 33.36 kg/m  BP Readings from Last 3 Encounters:  08/27/22 110/82  02/12/22 130/80  02/09/22 127/81   Wt Readings from Last 3 Encounters:  08/27/22 219 lb 6.4 oz (99.5 kg)  02/12/22 212 lb (96.2 kg)  02/09/22 212 lb 3.2 oz (96.3 kg)   SpO2 Readings from Last 3 Encounters:  08/27/22 98%  02/12/22 98%  02/09/22 93%      Physical Exam Vitals and nursing note reviewed.  Constitutional:      General: She is not in acute distress.    Appearance: Normal appearance. She is well-developed.  HENT:     Head: Normocephalic and atraumatic.  Eyes:     General: No scleral icterus.       Right eye: No discharge.        Left eye: No discharge.  Cardiovascular:     Rate and Rhythm: Normal rate and regular rhythm.     Heart sounds: No murmur heard. Pulmonary:     Effort: Pulmonary effort is normal. No respiratory distress.     Breath sounds: Normal breath sounds.  Musculoskeletal:        General: Normal range of motion.     Cervical back: Normal range of motion and neck supple.     Right lower leg: No edema.     Left lower leg: No edema.  Skin:    General: Skin is warm and dry.  Neurological:     Mental Status: She is alert and oriented to person, place, and time.  Psychiatric:        Mood and Affect: Mood  normal.        Behavior: Behavior normal.        Thought Content: Thought content normal.        Judgment: Judgment normal.      No results found for  any visits on 08/27/22.  Last CBC Lab Results  Component Value Date   WBC 5.9 01/22/2022   HGB 12.9 01/22/2022   HCT 38.2 01/22/2022   MCV 87.6 01/22/2022   MCH 29.6 01/22/2022   RDW 13.3 01/22/2022   PLT 317 01/22/2022   Last metabolic panel Lab Results  Component Value Date   GLUCOSE 89 01/22/2022   NA 141 01/22/2022   K 3.2 (L) 01/22/2022   CL 102 01/22/2022   CO2 28 01/22/2022   BUN 8 01/22/2022   CREATININE 0.79 01/22/2022   EGFR 91 08/12/2021   CALCIUM 9.0 01/22/2022   PROT 6.6 01/22/2022   ALBUMIN 4.1 08/12/2021   LABGLOB 2.1 08/12/2021   AGRATIO 2.0 08/12/2021   BILITOT 0.8 01/22/2022   ALKPHOS 128 (H) 08/12/2021   AST 19 01/22/2022   ALT 25 01/22/2022   ANIONGAP 5 07/01/2021   Last lipids Lab Results  Component Value Date   CHOL 205 (H) 01/22/2022   HDL 95 01/22/2022   LDLCALC 92 01/22/2022   LDLDIRECT 105.0 11/23/2010   TRIG 89 01/22/2022   CHOLHDL 2.2 01/22/2022   Last hemoglobin A1c Lab Results  Component Value Date   HGBA1C 5.4 08/12/2021   Last thyroid functions Lab Results  Component Value Date   TSH 1.17 01/22/2022   Last vitamin D Lab Results  Component Value Date   VD25OH 40 01/22/2022   Last vitamin B12 and Folate Lab Results  Component Value Date   VITAMINB12 556 01/22/2022      The 10-year ASCVD risk score (Arnett DK, et al., 2019) is: 3.9%    Assessment & Plan:   Problem List Items Addressed This Visit       Unprioritized   Vitamin D deficiency - Primary   Relevant Orders   VITAMIN D 25 Hydroxy (Vit-D Deficiency, Fractures)   Class 2 severe obesity with serious comorbidity and body mass index (BMI) of 35.0 to 35.9 in adult [facility])   Relevant Orders   CBC with Differential/Platelet   Comprehensive metabolic panel   Hemoglobin A1c   Insulin, random   Vitamin B12   VITAMIN D 25 Hydroxy (Vit-D Deficiency, Fractures)   Hyperlipidemia    Encourage heart healthy diet such as MIND or DASH diet, increase exercise,  avoid trans fats, simple carbohydrates and processed foods, consider a krill or fish or flaxseed oil cap daily.        Relevant Orders   CBC with Differential/Platelet   Comprehensive metabolic panel   Lipid panel   Essential hypertension (Chronic)    Well controlled, no changes to meds. Encouraged heart healthy diet such as the DASH diet and exercise as tolerated.        Relevant Orders   Comprehensive metabolic panel   Other Visit Diagnoses     B12 deficiency       Need for shingles vaccine       Relevant Orders   Zoster Recombinant (Shingrix ) (Completed)   Anxiety       Relevant Medications   ALPRAZolam (XANAX) 0.25 MG tablet       No follow-ups on file.    Donato Schultz, DO

## 2022-08-27 NOTE — Assessment & Plan Note (Signed)
Encourage heart healthy diet such as MIND or DASH diet, increase exercise, avoid trans fats, simple carbohydrates and processed foods, consider a krill or fish or flaxseed oil cap daily.  °

## 2022-08-27 NOTE — Assessment & Plan Note (Signed)
Well controlled, no changes to meds. Encouraged heart healthy diet such as the DASH diet and exercise as tolerated.  °

## 2022-08-28 LAB — VITAMIN D 25 HYDROXY (VIT D DEFICIENCY, FRACTURES): Vit D, 25-Hydroxy: 52 ng/mL (ref 30–100)

## 2022-08-30 LAB — CBC WITH DIFFERENTIAL/PLATELET
Absolute Monocytes: 658 cells/uL (ref 200–950)
Basophils Absolute: 28 cells/uL (ref 0–200)
Basophils Relative: 0.4 %
Eosinophils Absolute: 133 cells/uL (ref 15–500)
Eosinophils Relative: 1.9 %
HCT: 38.6 % (ref 35.0–45.0)
Hemoglobin: 12.6 g/dL (ref 11.7–15.5)
Lymphs Abs: 1827 cells/uL (ref 850–3900)
MCH: 28.1 pg (ref 27.0–33.0)
MCHC: 32.6 g/dL (ref 32.0–36.0)
MCV: 86 fL (ref 80.0–100.0)
MPV: 10.3 fL (ref 7.5–12.5)
Monocytes Relative: 9.4 %
Neutro Abs: 4354 cells/uL (ref 1500–7800)
Neutrophils Relative %: 62.2 %
Platelets: 308 10*3/uL (ref 140–400)
RBC: 4.49 10*6/uL (ref 3.80–5.10)
RDW: 12.9 % (ref 11.0–15.0)
Total Lymphocyte: 26.1 %
WBC: 7 10*3/uL (ref 3.8–10.8)

## 2022-08-30 LAB — LIPID PANEL
Cholesterol: 141 mg/dL (ref <200)
HDL: 86 mg/dL (ref >=50)
LDL Cholesterol (Calc): 38 mg/dL (calc)
Non-HDL Cholesterol (Calc): 55 mg/dL (calc) (ref <130)
Total CHOL/HDL Ratio: 1.6 (calc) (ref <5.0)
Triglycerides: 83 mg/dL (ref <150)

## 2022-08-30 LAB — COMPREHENSIVE METABOLIC PANEL
AG Ratio: 1.5 (calc) (ref 1.0–2.5)
ALT: 13 U/L (ref 6–29)
AST: 15 U/L (ref 10–35)
Albumin: 3.9 g/dL (ref 3.6–5.1)
Alkaline phosphatase (APISO): 121 U/L (ref 37–153)
BUN: 13 mg/dL (ref 7–25)
CO2: 32 mmol/L (ref 20–32)
Calcium: 9.6 mg/dL (ref 8.6–10.4)
Chloride: 97 mmol/L — ABNORMAL LOW (ref 98–110)
Creat: 0.73 mg/dL (ref 0.50–1.03)
Globulin: 2.6 g/dL (calc) (ref 1.9–3.7)
Glucose, Bld: 107 mg/dL — ABNORMAL HIGH (ref 65–99)
Potassium: 2.8 mmol/L — ABNORMAL LOW (ref 3.5–5.3)
Sodium: 140 mmol/L (ref 135–146)
Total Bilirubin: 0.7 mg/dL (ref 0.2–1.2)
Total Protein: 6.5 g/dL (ref 6.1–8.1)

## 2022-08-30 LAB — HEMOGLOBIN A1C
Hgb A1c MFr Bld: 5.7 % of total Hgb — ABNORMAL HIGH (ref <5.7)
Mean Plasma Glucose: 117 mg/dL
eAG (mmol/L): 6.5 mmol/L

## 2022-08-30 LAB — INSULIN, RANDOM: Insulin: 10 u[IU]/mL

## 2022-08-30 LAB — VITAMIN B12: Vitamin B-12: 379 pg/mL (ref 200–1100)

## 2022-09-03 ENCOUNTER — Other Ambulatory Visit: Payer: Self-pay

## 2022-09-03 DIAGNOSIS — I10 Essential (primary) hypertension: Secondary | ICD-10-CM

## 2022-09-03 MED ORDER — POTASSIUM CHLORIDE CRYS ER 20 MEQ PO TBCR: 20.0000 meq | EXTENDED_RELEASE_TABLET | Freq: Two times a day (BID) | ORAL | 2 refills | Status: DC

## 2022-09-27 ENCOUNTER — Other Ambulatory Visit: Payer: Self-pay | Admitting: Family Medicine

## 2022-09-27 DIAGNOSIS — I10 Essential (primary) hypertension: Secondary | ICD-10-CM

## 2022-09-30 ENCOUNTER — Encounter (INDEPENDENT_AMBULATORY_CARE_PROVIDER_SITE_OTHER): Payer: Self-pay

## 2022-10-12 ENCOUNTER — Ambulatory Visit (HOSPITAL_BASED_OUTPATIENT_CLINIC_OR_DEPARTMENT_OTHER)
Admission: RE | Admit: 2022-10-12 | Discharge: 2022-10-12 | Disposition: A | Discharge status: Home / Self Care | Payer: 59 | Source: Ambulatory Visit | Attending: Family Medicine | Admitting: Family Medicine

## 2022-10-12 ENCOUNTER — Encounter: Payer: Self-pay | Admitting: Family Medicine

## 2022-10-12 ENCOUNTER — Ambulatory Visit: Payer: 59 | Admitting: Family Medicine

## 2022-10-12 VITALS — BP 110/80 | HR 49 | Temp 98.6°F | Resp 18 | Ht 68.0 in | Wt 218.8 lb

## 2022-10-12 DIAGNOSIS — R079 Chest pain, unspecified: Secondary | ICD-10-CM

## 2022-10-12 DIAGNOSIS — I10 Essential (primary) hypertension: Secondary | ICD-10-CM

## 2022-10-12 DIAGNOSIS — R0609 Other forms of dyspnea: Secondary | ICD-10-CM | POA: Insufficient documentation

## 2022-10-12 DIAGNOSIS — R5383 Other fatigue: Secondary | ICD-10-CM

## 2022-10-12 DIAGNOSIS — C8590 Non-Hodgkin lymphoma, unspecified, unspecified site: Secondary | ICD-10-CM | POA: Insufficient documentation

## 2022-10-12 DIAGNOSIS — E559 Vitamin D deficiency, unspecified: Secondary | ICD-10-CM

## 2022-10-12 DIAGNOSIS — E538 Deficiency of other specified B group vitamins: Secondary | ICD-10-CM

## 2022-10-12 LAB — COMPREHENSIVE METABOLIC PANEL
ALT: 18 U/L (ref 0–35)
AST: 22 U/L (ref 0–37)
Albumin: 4 g/dL (ref 3.5–5.2)
Alkaline Phosphatase: 147 U/L — ABNORMAL HIGH (ref 39–117)
BUN: 17 mg/dL (ref 6–23)
CO2: 31 mEq/L (ref 19–32)
Calcium: 9.7 mg/dL (ref 8.4–10.5)
Chloride: 99 mEq/L (ref 96–112)
Creatinine, Ser: 0.72 mg/dL (ref 0.40–1.20)
GFR: 91.21 mL/min (ref >60.00)
Glucose, Bld: 87 mg/dL (ref 70–99)
Potassium: 3.2 mEq/L — ABNORMAL LOW (ref 3.5–5.1)
Sodium: 140 mEq/L (ref 135–145)
Total Bilirubin: 1 mg/dL (ref 0.2–1.2)
Total Protein: 7 g/dL (ref 6.0–8.3)

## 2022-10-12 LAB — CBC WITH DIFFERENTIAL/PLATELET
Basophils Absolute: 0 10*3/uL (ref 0.0–0.1)
Basophils Relative: 0.6 % (ref 0.0–3.0)
Eosinophils Absolute: 0.1 10*3/uL (ref 0.0–0.7)
Eosinophils Relative: 1.9 % (ref 0.0–5.0)
HCT: 38.3 % (ref 36.0–46.0)
Hemoglobin: 12.7 g/dL (ref 12.0–15.0)
Lymphocytes Relative: 32.6 % (ref 12.0–46.0)
Lymphs Abs: 1.9 10*3/uL (ref 0.7–4.0)
MCHC: 33.1 g/dL (ref 30.0–36.0)
MCV: 87.8 fl (ref 78.0–100.0)
Monocytes Absolute: 0.4 10*3/uL (ref 0.1–1.0)
Monocytes Relative: 7.9 % (ref 3.0–12.0)
Neutro Abs: 3.3 10*3/uL (ref 1.4–7.7)
Neutrophils Relative %: 57 % (ref 43.0–77.0)
Platelets: 304 10*3/uL (ref 150.0–400.0)
RBC: 4.36 Mil/uL (ref 3.87–5.11)
RDW: 13.9 % (ref 11.5–15.5)
WBC: 5.7 10*3/uL (ref 4.0–10.5)

## 2022-10-12 LAB — VITAMIN B12: Vitamin B-12: 454 pg/mL (ref 211–911)

## 2022-10-12 LAB — TROPONIN I (HIGH SENSITIVITY): High Sens Troponin I: 10 ng/L (ref 2–17)

## 2022-10-12 LAB — MONONUCLEOSIS SCREEN: Mono Screen: NEGATIVE

## 2022-10-12 LAB — VITAMIN D 25 HYDROXY (VIT D DEFICIENCY, FRACTURES): VITD: 48.56 ng/mL (ref 30.00–100.00)

## 2022-10-12 NOTE — Progress Notes (Signed)
Established Patient Office Visit  Subjective   Patient ID: Anna Jackson, female    DOB: 1961/01/27  Age: 62 y.o. MRN: [id number]  Chief Complaint  Patient presents with   Fatigue    Pt states fatigue has gotten worse the last several weeks.   Follow-up    HPI Pt is here c/o severe fatigue x few weeks , with shortness of breath and chest pain.    The chest pain can last all day. 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   Major depressive disorder with single episode, in full remission (HCC) 12/09/2020   Closed fracture of distal end of right radius 12/26/2019   Right wrist fracture 12/25/2019   Angina at rest 08/02/2019   Hypokalemia 07/28/2019   Chest pain, rule out acute myocardial infarction 07/27/2019   Recurrent hiatal hernia s/p robotic PEH repair/Nissen 03/16/2017 03/16/2017   Hiatal hernia    Acute bronchitis 06/14/2016   B12 deficiency 10/10/2014   Neuropathic pain 04/04/2014   Hyperlipidemia 03/12/2014   Obesity (BMI 30-39.9) 01/08/2014   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   Headache 05/16/2013   History of lymphoma 03/20/2013   Obesity, morbid (HCC)    Barrett's esophagus 08/10/2010   Heme positive stool 06/30/2010   Depression, major, single episode, in partial remission (HCC) 03/04/2009   GERD 12/08/2007   POSTMENOPAUSAL STATUS 11/21/2007   Anxiety state 05/09/2007   Pain in joint, lower leg 10/20/2006   HERPES SIMPLEX, UNCOMPLICATED 05/27/2006   ONYCHOMYCOSIS, TOENAILS 05/27/2006   Primary hypertension 04/26/2006   Asthma 04/26/2006   HEADACHE 04/26/2006   UMBILICAL HERNIORRHAPHY, HX OF  04/26/2006   Past Medical History:  Diagnosis Date   Adenopathy    left axillary   Anemia    Anxiety    Anxiety state, unspecified    Asthma    no problem with asthma in several years   Barrett esophagus    Chest pain    Constipation    Depression    Esophageal reflux    GERD (gastroesophageal reflux disease)    Headache 05/16/2013   Herpes simplex without mention of complication    Hyperlipidemia 03/12/2014   Hypertension    Joint pain    Lymphoma, 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   Mental disorder    Neuropathic pain 04/04/2014   Non Hodgkin's lymphoma (HCC) 2015   Skin infection 11/02/2013   Unspecified asthma(493.90)    Unspecified essential hypertension    URI (upper respiratory infection) 01/15/2014   Vitamin D deficiency    Past Surgical History:  Procedure Laterality Date   AXILLARY LYMPH NODE DISSECTION Left 03/09/2013   Procedure: EXCISION LEFT AXILLARY LYMPH NODES;  Surgeon: Ernestene Mention, MD;  Location: [facility];  Service: General;  Laterality: Left;   BREAST BIOPSY Left 02/13/2013   high risk   BREAST EXCISIONAL BIOPSY Left 03/09/2013   high risk   CERVICAL CONIZATION W/BX     CHOLECYSTECTOMY  1995   Open, done with Hiatal hernia repair   ESOPHAGEAL MANOMETRY N/A 01/12/2017   Procedure: ESOPHAGEAL MANOMETRY (EM);  Surgeon: Sherrilyn Rist, MD;  Location: [facility];  Service: Gastroenterology;  Laterality: N/A;   INCISIONAL HERNIA REPAIR  2004   Dr Carman Ching.  Parietex 20x15cm mesh   KNEE SURGERY Right 02/2010   Dr Simonne Come, Arthroscopic   NISSEN FUNDOPLICATION  1995   OPEN, Dr Kendrick Ranch   ORIF WRIST FRACTURE Right 12/25/2019   Procedure: #1 right wrist/radius open reduction internal fixation comminuted complex distal radius fracture with DVR Biomet cross lock plate and screw construct.  This was a greater than 3 part intra-articular fracture.  #2 AP lateral and oblique x-rays performed examined and interpreted by myself #3 evaluation anesthesia ulnar  styloid fracture with subsequent closed treatment based upon stability    PORTACATH PLACEMENT N/A 03/23/2013   Procedure: INSERTION PORT-A-CATH;  Surgeon: Ernestene Mention, MD;  Location: [facility] OR;  Service: General;  Laterality: N/A;   TUBAL LIGATION     VAGINAL HYSTERECTOMY  2002   Dr Art Stefano Gaul   Social History   Tobacco Use   Smoking status: Never   Smokeless tobacco: Never  Vaping Use   Vaping status: Never Used  Substance Use Topics   Alcohol use: Yes    Comment: occasional   Drug use: No   Social History   Socioeconomic History   Marital status: Significant Other    Spouse name: Not on file   Number of children: 1   Years of education: Not on file   Highest education level: Not on file  Occupational History   Occupation: RECEIVING CLERK    Employer: [employer]   Occupation: Armed forces operational officer  Tobacco Use   Smoking status: Never   Smokeless tobacco: Never  Vaping Use   Vaping status: Never Used  Substance and Sexual Activity   Alcohol use: Yes    Comment: occasional   Drug use: No   Sexual activity: Not Currently    Partners: Male  Other Topics Concern   Not on file  Social History Narrative   Exercise-- walking   Social Determinants of Health   Financial Resource Strain: Not on file  Food Insecurity: Not on file  Transportation Needs: Not on file  Physical Activity: Not on file  Stress: Not on file  Social Connections: Not on file  Intimate Partner Violence: Not on file   Family Status  Relation Name Status   Mother  Deceased   Father  Deceased   Brother  Deceased at age 63   MGM  (Not Specified)   Building control surveyor   Other  (Not Specified)   Other  (Not Specified)   Other  (Not Specified)   Neg Hx  (Not Specified)  No partnership data on file   Family History  Problem Relation Age of Onset   Heart disease Mother    High blood pressure Mother    High Cholesterol Mother    Anxiety disorder Mother    Heart disease Father    High Cholesterol  Father    High blood pressure Father    Heart disease Brother    Alcoholism Brother    Breast cancer Maternal Grandmother 48   Cancer Maternal Grandmother 50       breast   Diabetes Cousin    Coronary artery disease Other        with hernia repair   Migraines Other    Hypertension Other    Colon cancer Neg Hx    Allergies  Allergen Reactions   Betadine [Povidone Iodine] Hives and Itching   Codeine Nausea And Vomiting  Review of Systems  Constitutional:  Negative for chills, fever and malaise/fatigue.  HENT:  Negative for congestion and hearing loss.   Eyes:  Negative for discharge.  Respiratory:  Negative for cough, sputum production and shortness of breath.   Cardiovascular:  Negative for chest pain, palpitations and leg swelling.  Gastrointestinal:  Negative for abdominal pain, blood in stool, constipation, diarrhea, heartburn, nausea and vomiting.  Genitourinary:  Negative for dysuria, frequency, hematuria and urgency.  Musculoskeletal:  Negative for back pain, falls and myalgias.  Skin:  Negative for rash.  Neurological:  Negative for dizziness, sensory change, loss of consciousness, weakness and headaches.  Endo/Heme/Allergies:  Negative for environmental allergies. Does not bruise/bleed easily.  Psychiatric/Behavioral:  Negative for depression and suicidal ideas. The patient is not nervous/anxious and does not have insomnia.       Objective:     BP 110/80 (BP Location: Left Arm, Patient Position: Sitting, Cuff Size: Normal)   Pulse (!) 49   Temp 98.6 F (37 C) (Oral)   Resp 18   Ht 5\' 8"  (1.727 m)   Wt 218 lb 12.8 oz (99.2 kg)   SpO2 97%   BMI 33.27 kg/m  BP Readings from Last 3 Encounters:  10/12/22 110/80  08/27/22 110/82  02/12/22 130/80   Wt Readings from Last 3 Encounters:  10/12/22 218 lb 12.8 oz (99.2 kg)  08/27/22 219 lb 6.4 oz (99.5 kg)  02/12/22 212 lb (96.2 kg)   SpO2 Readings from Last 3 Encounters:  10/12/22 97%  08/27/22 98%   02/12/22 98%      Physical Exam Vitals and nursing note reviewed.  Constitutional:      General: She is not in acute distress.    Appearance: Normal appearance. She is well-developed.  HENT:     Head: Normocephalic and atraumatic.  Eyes:     General: No scleral icterus.       Right eye: No discharge.        Left eye: No discharge.  Cardiovascular:     Rate and Rhythm: Normal rate and regular rhythm.     Heart sounds: No murmur heard. Pulmonary:     Effort: Pulmonary effort is normal. No respiratory distress.     Breath sounds: Normal breath sounds.  Musculoskeletal:        General: Normal range of motion.     Cervical back: Normal range of motion and neck supple.     Right lower leg: No edema.     Left lower leg: No edema.  Skin:    General: Skin is warm and dry.  Neurological:     Mental Status: She is alert and oriented to person, place, and time.  Psychiatric:        Mood and Affect: Mood normal.        Behavior: Behavior normal.        Thought Content: Thought content normal.        Judgment: Judgment normal.      Results for orders placed or performed in visit on 10/12/22  D-Dimer, Quantitative  Result Value Ref Range   D-Dimer, Quant 0.29 <0.50 mcg/mL FEU    Last CBC Lab Results  Component Value Date   WBC 7.0 08/27/2022   HGB 12.6 08/27/2022   HCT 38.6 08/27/2022   MCV 86.0 08/27/2022   MCH 28.1 08/27/2022   RDW 12.9 08/27/2022   PLT 308 08/27/2022   Last metabolic panel Lab Results  Component Value Date   GLUCOSE 107 (  H) 08/27/2022   NA 140 08/27/2022   K 2.8 (L) 08/27/2022   CL 97 (L) 08/27/2022   CO2 32 08/27/2022   BUN 13 08/27/2022   CREATININE 0.73 08/27/2022   EGFR 91 08/12/2021   CALCIUM 9.6 08/27/2022   PROT 6.5 08/27/2022   ALBUMIN 4.1 08/12/2021   LABGLOB 2.1 08/12/2021   AGRATIO 2.0 08/12/2021   BILITOT 0.7 08/27/2022   ALKPHOS 128 (H) 08/12/2021   AST 15 08/27/2022   ALT 13 08/27/2022   ANIONGAP 5 07/01/2021   Last  lipids Lab Results  Component Value Date   CHOL 141 08/27/2022   HDL 86 08/27/2022   LDLCALC 38 08/27/2022   LDLDIRECT 105.0 11/23/2010   TRIG 83 08/27/2022   CHOLHDL 1.6 08/27/2022   Last hemoglobin A1c Lab Results  Component Value Date   HGBA1C 5.7 (H) 08/27/2022   Last thyroid functions Lab Results  Component Value Date   TSH 1.17 01/22/2022   Last vitamin D Lab Results  Component Value Date   VD25OH 52 08/27/2022   Last vitamin B12 and Folate Lab Results  Component Value Date   VITAMINB12 379 08/27/2022      The 10-year ASCVD risk score (Arnett DK, et al., 2019) is: 1.6%    Assessment & Plan:   Problem List Items Addressed This Visit       Unprioritized   Lymphoma, T-cell (HCC)   Relevant Orders   DG Chest 2 View   Chest pain   Relevant Orders   EKG 12-Lead (Completed)   D-Dimer, Quantitative (Completed)   Troponin I (High Sensitivity)   Vitamin D deficiency   Relevant Orders   VITAMIN D 25 Hydroxy (Vit-D Deficiency, Fractures)   Primary hypertension - Primary   Other fatigue    Check labs  EKG---  sinus rhythm        Relevant Orders   CBC with Differential/Platelet   Comprehensive metabolic panel   Thyroid Panel With TSH   Vitamin B12   VITAMIN D 25 Hydroxy (Vit-D Deficiency, Fractures)   Epstein-Barr virus VCA antibody panel   Mononucleosis screen   DOE (dyspnea on exertion)   Relevant Orders   DG Chest 2 View   D-Dimer, Quantitative (Completed)   Troponin I (High Sensitivity)   B12 deficiency   Relevant Orders   Vitamin B12    No follow-ups on file.    Donato Schultz, DO

## 2022-10-12 NOTE — Assessment & Plan Note (Signed)
Check labs  EKG---  sinus rhythm

## 2022-10-13 LAB — THYROID PANEL WITH TSH
Free Thyroxine Index: 2.7 (ref 1.4–3.8)
T3 Uptake: 30 % (ref 22–35)
T4, Total: 9.1 ug/dL (ref 5.1–11.9)
TSH: 0.7 m[IU]/L (ref 0.40–4.50)

## 2022-10-13 LAB — EPSTEIN-BARR VIRUS VCA ANTIBODY PANEL
EBV NA IgG: >600 U/mL — ABNORMAL HIGH
EBV VCA IgG: >750 U/mL — ABNORMAL HIGH
EBV VCA IgM: <36 U/mL

## 2022-10-13 LAB — D-DIMER, QUANTITATIVE: D-Dimer, Quant: 0.29 ug{FEU}/mL (ref <0.50)

## 2022-10-13 MED ORDER — VITAMIN D (ERGOCALCIFEROL) 1.25 MG (50000 UNIT) PO CAPS: 50000.0000 [IU] | ORAL_CAPSULE | ORAL | 0 refills | Status: DC

## 2022-10-13 MED ORDER — POTASSIUM CHLORIDE CRYS ER 20 MEQ PO TBCR: 20.0000 meq | EXTENDED_RELEASE_TABLET | Freq: Two times a day (BID) | ORAL | 2 refills | Status: DC

## 2022-10-13 NOTE — Addendum Note (Signed)
Addended byConrad Gretna D on: 10/13/2022 12:01 PM   Modules accepted: Orders

## 2022-10-28 ENCOUNTER — Ambulatory Visit: Payer: 59

## 2022-11-20 ENCOUNTER — Other Ambulatory Visit: Payer: Self-pay | Admitting: Family Medicine

## 2022-11-20 DIAGNOSIS — E785 Hyperlipidemia, unspecified: Secondary | ICD-10-CM

## 2022-12-07 ENCOUNTER — Encounter: Payer: Self-pay | Admitting: Orthopaedic Surgery

## 2022-12-07 ENCOUNTER — Other Ambulatory Visit (INDEPENDENT_AMBULATORY_CARE_PROVIDER_SITE_OTHER): Payer: 59

## 2022-12-07 ENCOUNTER — Ambulatory Visit: Payer: 59 | Admitting: Orthopaedic Surgery

## 2022-12-07 VITALS — BP 120/77 | HR 70 | Ht 68.0 in | Wt 214.0 lb

## 2022-12-07 DIAGNOSIS — M4316 Spondylolisthesis, lumbar region: Secondary | ICD-10-CM

## 2022-12-07 DIAGNOSIS — M1712 Unilateral primary osteoarthritis, left knee: Secondary | ICD-10-CM | POA: Diagnosis not present

## 2022-12-07 DIAGNOSIS — M25562 Pain in left knee: Secondary | ICD-10-CM

## 2022-12-07 DIAGNOSIS — M79605 Pain in left leg: Secondary | ICD-10-CM

## 2022-12-07 MED ORDER — BUPIVACAINE HCL 0.25 % IJ SOLN
4.0000 mL | INTRAMUSCULAR | Status: AC | PRN
  Administered 2022-12-07: 4 mL via INTRA_ARTICULAR

## 2022-12-07 MED ORDER — LIDOCAINE HCL 1 % IJ SOLN
0.5000 mL | INTRAMUSCULAR | Status: AC | PRN
  Administered 2022-12-07: .5 mL

## 2022-12-07 MED ORDER — METHYLPREDNISOLONE ACETATE 40 MG/ML IJ SUSP
40.0000 mg | INTRAMUSCULAR | Status: AC | PRN
  Administered 2022-12-07: 40 mg via INTRA_ARTICULAR

## 2022-12-07 NOTE — Progress Notes (Signed)
Office Visit Note   Patient: Anna Jackson           Date of Birth: [birth year]           MRN: [id number] Visit Date: 12/07/2022              Requested by: [address] [place], [place] [address] [place] [place],  [place] [address] PCP: Zola Button, Grayling Congress, DO   Assessment & Plan: Visit Diagnoses:  1. Pain in left leg   2. Acute pain of left knee   3. Unilateral primary osteoarthritis, left knee   4. Spondylolisthesis, lumbar region     Plan: Left knee injected we did use Betadine she reported she would be allergic Betadine but but her symptoms were itching after she had a Port-A-Cath without rash or blistering.  She had also had multiple other surgeries and likely had Betadine prep with those.  Injection performed left knee anterolateral portal Betadine cleaned off Band-Aid applied she tolerated the injection well recheck 6 weeks.  Follow-Up Instructions: Return in about 6 weeks (around 01/18/2023).   Orders:  Orders Placed This Encounter  Procedures   Large Joint Inj: R knee   XR KNEE 3 VIEW LEFT   XR Lumbar Spine 2-3 Views   No orders of the defined types were placed in this encounter.     Procedures: Large Joint Inj: R knee on 12/07/2022 10:27 AM Indications: pain and joint swelling Details: 22 G 1.5 in needle, anterolateral approach  Arthrogram: No  Medications: 40 mg methylPREDNISolone acetate 40 MG/ML; 0.5 mL lidocaine 1 %; 4 mL bupivacaine 0.25 % Outcome: tolerated well, no immediate complications Procedure, treatment alternatives, risks and benefits explained, specific risks discussed. Consent was given by the patient. Immediately prior to procedure a time out was called to verify the correct patient, procedure, equipment, support staff and site/side marked as required. Patient was prepped and draped in the usual sterile fashion.       Clinical Data: No additional findings.   Subjective: Chief Complaint  Patient presents with   Left Knee -  Pain   Left Leg - Pain    HPI 62 year old female with left knee pain.  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  Past history of right knee arthroscopy several years ago with similar type problems in the right knee.  She does have a history of lymphoma.  Additional history of headaches depression and bronchitis.  Review of Systems all other systems 14 point systems noncontributory to HPI.   Objective: Vital Signs: BP 120/77   Pulse 70   Ht 5\' 8"  (1.727 m)   Wt 214 lb (97.1 kg)   BMI 32.54 kg/m   Physical Exam Constitutional:      Appearance: She is well-developed.  HENT:     Head: Normocephalic.     Right Ear: External ear normal.     Left Ear: External ear normal. There is no impacted cerumen.  Eyes:     Pupils: Pupils are equal, round, and reactive to light.  Neck:     Thyroid: No thyromegaly.     Trachea: No tracheal deviation.  Cardiovascular:     Rate and Rhythm: Normal rate.  Pulmonary:     Effort: Pulmonary effort is normal.  Abdominal:  Palpations: Abdomen is soft.  Musculoskeletal:     Cervical back: No rigidity.  Skin:    General: Skin is warm and dry.  Neurological:     Mental Status: She is alert and oriented to person, place, and time.  Psychiatric:        Behavior: Behavior normal.     Ortho Exam patient has crepitus with knee range of motion full extension good flexion she uses her arms to get from sitting to standing.  After standing for several seconds she is able ambulate across the exam floor with slight left knee limp negative logroll hips pulses are intact.  No rash over exposed skin.  Specialty Comments:  No specialty comments available.  Imaging: XR KNEE 3 VIEW LEFT  Result Date: 12/07/2022 Standing AP both knees lateral left knee obtained and reviewed.  There is collapse of the  medial compartment with 1 mm joint space marginal osteophytes medial lateral and patellofemoral degenerative changes.  Similar medial narrowing on the opposite right knee not as severe. Impression: Left greater than right knee osteoarthritis with worse medial compartment.  XR Lumbar Spine 2-3 Views  Result Date: 12/07/2022 AP lateral lumbar images are obtained and reviewed.  This shows degenerative facet changes at L4-5 with some disc space narrowing and grade 1 anterolisthesis. Impression: L4-5 degenerative anterolisthesis.  Grade 1.  Facet arthropathy noted.    PMFS History: Patient Active Problem List   Diagnosis Date Noted   Unilateral primary osteoarthritis, left knee 12/07/2022   Spondylolisthesis, lumbar region 12/07/2022   DOE (dyspnea on exertion) 10/12/2022   Bronchitis 02/12/2022   Vitamin D deficiency 12/01/2021   Depression 12/01/2021   Class 2 severe obesity with serious comorbidity and body mass index (BMI) of 35.0 to 35.9 in adult 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   Major depressive disorder with single episode, in full remission (HCC) 12/09/2020   Closed fracture of distal end of right radius 12/26/2019   Right wrist fracture 12/25/2019   Angina at rest [facility]) 08/02/2019   Hypokalemia 07/28/2019   Chest pain, rule out acute myocardial infarction 07/27/2019   Recurrent hiatal hernia s/p robotic PEH repair/Nissen 03/16/2017 03/16/2017   Hiatal hernia    Acute bronchitis 06/14/2016   B12 deficiency 10/10/2014   Neuropathic pain 04/04/2014   Hyperlipidemia 03/12/2014   Obesity (BMI 30-39.9) 01/08/2014   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   Headache 05/16/2013   History of lymphoma 03/20/2013   Obesity, morbid (HCC)    Barrett's esophagus 08/10/2010   Heme positive stool 06/30/2010   Depression, major, single episode, in partial remission (HCC) 03/04/2009   GERD 12/08/2007    POSTMENOPAUSAL STATUS 11/21/2007   Anxiety state 05/09/2007   Pain in joint, lower leg 10/20/2006   HERPES SIMPLEX, UNCOMPLICATED 05/27/2006   ONYCHOMYCOSIS, TOENAILS 05/27/2006   Primary hypertension 04/26/2006   Asthma 04/26/2006   HEADACHE 04/26/2006   UMBILICAL HERNIORRHAPHY, HX OF 04/26/2006   Past Medical History:  Diagnosis Date   Adenopathy    left axillary   Anemia    Anxiety    Anxiety state, unspecified    Asthma    no problem with asthma in several years   Barrett esophagus    Chest pain    Constipation    Depression    Esophageal reflux    GERD (gastroesophageal reflux disease)    Headache 05/16/2013   Herpes simplex without mention of complication  Hyperlipidemia 03/12/2014   Hypertension    Joint pain    Lymphoma, 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   Mental disorder    Neuropathic pain 04/04/2014   Non Hodgkin's lymphoma (HCC) 2015   Skin infection 11/02/2013   Unspecified asthma(493.90)    Unspecified essential hypertension    URI (upper respiratory infection) 01/15/2014   Vitamin D deficiency     Family History  Problem Relation Age of Onset   Heart disease Mother    High blood pressure Mother    High Cholesterol Mother    Anxiety disorder Mother    Heart disease Father    High Cholesterol Father    High blood pressure Father    Heart disease Brother    Alcoholism Brother    Breast cancer Maternal Grandmother 25   Cancer Maternal Grandmother 59       breast   Diabetes Cousin    Coronary artery disease Other        with hernia repair   Migraines Other    Hypertension Other    Colon cancer Neg Hx     Past Surgical History:  Procedure Laterality Date   AXILLARY LYMPH NODE DISSECTION Left 03/09/2013   Procedure: EXCISION LEFT AXILLARY LYMPH NODES;  Surgeon: Ernestene Mention, MD;  Location: [facility];  Service: General;  Laterality: Left;   BREAST BIOPSY Left 02/13/2013   high risk   BREAST EXCISIONAL BIOPSY Left 03/09/2013   high risk   CERVICAL  CONIZATION W/BX     CHOLECYSTECTOMY  1995   Open, done with Hiatal hernia repair   ESOPHAGEAL MANOMETRY N/A 01/12/2017   Procedure: ESOPHAGEAL MANOMETRY (EM);  Surgeon: Sherrilyn Rist, MD;  Location: [facility];  Service: Gastroenterology;  Laterality: N/A;   INCISIONAL HERNIA REPAIR  2004   Dr Carman Ching.  Parietex 20x15cm mesh   KNEE SURGERY Right 02/2010   Dr Simonne Come, Arthroscopic   NISSEN FUNDOPLICATION  1995   OPEN, Dr Kendrick Ranch   ORIF WRIST FRACTURE Right 12/25/2019   Procedure: #1 right wrist/radius open reduction internal fixation comminuted complex distal radius fracture with DVR Biomet cross lock plate and screw construct.  This was a greater than 3 part intra-articular fracture.  #2 AP lateral and oblique x-rays performed examined and interpreted by myself #3 evaluation anesthesia ulnar styloid fracture with subsequent closed treatment based upon stability    PORTACATH PLACEMENT N/A 03/23/2013   Procedure: INSERTION PORT-A-CATH;  Surgeon: Ernestene Mention, MD;  Location: [facility] OR;  Service: General;  Laterality: N/A;   TUBAL LIGATION     VAGINAL HYSTERECTOMY  2002   Dr Art Stefano Gaul   Social History   Occupational History   Occupation: RECEIVING CLERK    Employer: [employer]   Occupation: Armed forces operational officer  Tobacco Use   Smoking status: Never   Smokeless tobacco: Never  Vaping Use   Vaping status: Never Used  Substance and Sexual Activity   Alcohol use: Yes    Comment: occasional   Drug use: No   Sexual activity: Not Currently    Partners: Male

## 2022-12-15 ENCOUNTER — Ambulatory Visit: Payer: 59 | Admitting: Orthopaedic Surgery

## 2023-01-18 ENCOUNTER — Ambulatory Visit: Payer: 59 | Admitting: Orthopaedic Surgery

## 2023-01-24 ENCOUNTER — Encounter: Payer: Self-pay | Admitting: Orthopaedic Surgery

## 2023-01-24 ENCOUNTER — Telehealth: Payer: Self-pay | Admitting: Radiology

## 2023-01-24 ENCOUNTER — Ambulatory Visit: Payer: 59 | Admitting: Orthopaedic Surgery

## 2023-01-24 VITALS — BP 153/80 | HR 69 | Ht 68.0 in | Wt 214.0 lb

## 2023-01-24 DIAGNOSIS — M1712 Unilateral primary osteoarthritis, left knee: Secondary | ICD-10-CM | POA: Diagnosis not present

## 2023-01-24 MED ORDER — CYCLOBENZAPRINE HCL 5 MG PO TABS: 5.0000 mg | ORAL_TABLET | Freq: Every day | ORAL | 0 refills | Status: DC

## 2023-01-24 NOTE — Telephone Encounter (Signed)
Please obtain authorization for left knee gel injection-Yates Thanks.

## 2023-01-24 NOTE — Progress Notes (Signed)
Office Visit Note   Patient: Anna Jackson           Date of Birth: 09/05/1960           MRN: [id number] Visit Date: 01/24/2023              Requested by: [address] [place], [place] [address] [place],  [place] [address] PCP: Zola Button, Grayling Congress, DO   Assessment & Plan: Visit Diagnoses:  1. Unilateral primary osteoarthritis, left knee     Plan: Patient states she is able to work some from home but this point cannot take time off for surgery and has bone-on-bone changes and surgical treatment would be total knee arthroplasty.  Will set up for some Visco injections hopefully this will give her some time until will she is able to have enough time off that she could proceed with total knee arthroplasty and postop rehab.  Plan Visco injections left knee.  Follow-Up Instructions: No follow-ups on file.   Orders:  No orders of the defined types were placed in this encounter.  Meds ordered this encounter  Medications   cyclobenzaprine (FLEXERIL) 5 MG tablet    Sig: Take 1 tablet (5 mg total) by mouth at bedtime.    Dispense:  30 tablet    Refill:  0      Procedures: No procedures performed   Clinical Data: No additional findings.   Subjective: Chief Complaint  Patient presents with   Left Knee - Follow-up, Pain    HPI 62 year old female returns for follow-up left knee arthritis bone-on-bone changes medial compartment with marginal osteophytes.  She had an intra-articular injection 12/05/2022 and states it only lasted 2 days.  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  Past history of knee arthroscopy opposite right knee greater than 10 years ago which had mild changes and had a meniscal tear which did well.  Right knee bothers her some but not as severe as the left.  She has problems doing a lot of walking.  She uses ibuprofen really has not helped.  Today we reviewed her plain radiographs which shows bone-on-bone  medial compartment marginal osteophytes tricompartment degenerative changes but most severe in the medial compartment.  Review of Systems all other systems noncontributory to H PI.  Past history of asthma hypertension neuropathy due to chemotherapeutic drugs, T-cell lymphoma  Hx .   Objective: Vital Signs: BP (!) 153/80   Pulse 69   Ht 5\' 8"  (1.727 m)   Wt 214 lb (97.1 kg)   BMI 32.54 kg/m   Physical Exam Constitutional:      Appearance: She is well-developed.  HENT:     Head: Normocephalic.     Right Ear: External ear normal.     Left Ear: External ear normal. There is no impacted cerumen.  Eyes:     Pupils: Pupils are equal, round, and reactive to light.  Neck:     Thyroid: No thyromegaly.     Trachea: No tracheal deviation.  Cardiovascular:     Rate and Rhythm: Normal rate.  Pulmonary:     Effort: Pulmonary effort is normal.  Abdominal:     Palpations: Abdomen is soft.  Musculoskeletal:     Cervical back: No rigidity.  Skin:    General: Skin is warm and dry.  Neurological:     Mental Status: She is alert and oriented to person, place, and time.  Psychiatric:        Behavior: Behavior normal.     Ortho Exam crepitus with knee range of motion more medial lateral joint line tenderness tenderness posteriorly some prominence posterior she may have a small Baker's cyst 2+ knee effusion negative logroll hips.  Distal pulses are intact.  Specialty Comments:  No specialty comments available.  Imaging: No results found.   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   Major depressive disorder with single episode, in full remission (HCC) 12/09/2020   Closed fracture of distal end of right radius 12/26/2019   Right wrist fracture 12/25/2019   Angina at rest [facility]) 08/02/2019   Hypokalemia 07/28/2019   Chest pain, rule out acute myocardial infarction 07/27/2019   Recurrent hiatal hernia s/p robotic PEH repair/Nissen 03/16/2017 03/16/2017   Hiatal hernia    Acute bronchitis 06/14/2016   B12 deficiency 10/10/2014   Neuropathic pain 04/04/2014   Hyperlipidemia 03/12/2014   Obesity (BMI 30-39.9) 01/08/2014   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   Headache 05/16/2013   History of lymphoma 03/20/2013   Obesity, morbid (HCC)    Barrett's esophagus 08/10/2010   Heme positive stool 06/30/2010   Depression, major, single episode, in partial remission (HCC) 03/04/2009   GERD 12/08/2007   POSTMENOPAUSAL STATUS 11/21/2007   Anxiety state 05/09/2007   Pain in joint, lower leg 10/20/2006   HERPES SIMPLEX, UNCOMPLICATED 05/27/2006   ONYCHOMYCOSIS, TOENAILS 05/27/2006   Primary hypertension 04/26/2006   Asthma 04/26/2006   HEADACHE 04/26/2006   UMBILICAL HERNIORRHAPHY, HX OF 04/26/2006   Past Medical History:  Diagnosis Date   Adenopathy    left axillary   Anemia    Anxiety    Anxiety state, unspecified    Asthma    no problem with asthma in several years   Barrett esophagus    Chest pain    Constipation    Depression    Esophageal reflux    GERD (gastroesophageal reflux disease)    Headache 05/16/2013   Herpes simplex without mention of complication    Hyperlipidemia 03/12/2014   Hypertension    Joint pain    Lymphoma, 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   Mental disorder    Neuropathic pain 04/04/2014   Non Hodgkin's lymphoma (HCC) 2015   Skin infection 11/02/2013   Unspecified asthma(493.90)    Unspecified essential hypertension    URI (upper respiratory infection) 01/15/2014    Vitamin D deficiency     Family History  Problem Relation Age of Onset   Heart disease Mother    High blood pressure Mother    High Cholesterol Mother    Anxiety disorder Mother    Heart disease Father    High Cholesterol Father    High blood pressure Father    Heart disease Brother    Alcoholism Brother    Breast cancer Maternal Grandmother 48   Cancer Maternal Grandmother 50       breast   Diabetes Cousin    Coronary artery disease Other  with hernia repair   Migraines Other    Hypertension Other    Colon cancer Neg Hx     Past Surgical History:  Procedure Laterality Date   AXILLARY LYMPH NODE DISSECTION Left 03/09/2013   Procedure: EXCISION LEFT AXILLARY LYMPH NODES;  Surgeon: Ernestene Mention, MD;  Location: [facility];  Service: General;  Laterality: Left;   BREAST BIOPSY Left 02/13/2013   high risk   BREAST EXCISIONAL BIOPSY Left 03/09/2013   high risk   CERVICAL CONIZATION W/BX     CHOLECYSTECTOMY  1995   Open, done with Hiatal hernia repair   ESOPHAGEAL MANOMETRY N/A 01/12/2017   Procedure: ESOPHAGEAL MANOMETRY (EM);  Surgeon: Sherrilyn Rist, MD;  Location: [facility];  Service: Gastroenterology;  Laterality: N/A;   INCISIONAL HERNIA REPAIR  2004   Dr Carman Ching.  Parietex 20x15cm mesh   KNEE SURGERY Right 02/2010   Dr Simonne Come, Arthroscopic   NISSEN FUNDOPLICATION  1995   OPEN, Dr Kendrick Ranch   ORIF WRIST FRACTURE Right 12/25/2019   Procedure: #1 right wrist/radius open reduction internal fixation comminuted complex distal radius fracture with DVR Biomet cross lock plate and screw construct.  This was a greater than 3 part intra-articular fracture.  #2 AP lateral and oblique x-rays performed examined and interpreted by myself #3 evaluation anesthesia ulnar styloid fracture with subsequent closed treatment based upon stability    PORTACATH PLACEMENT N/A 03/23/2013   Procedure: INSERTION PORT-A-CATH;  Surgeon: Ernestene Mention, MD;  Location[facility] OR;  Service:  General;  Laterality: N/A;   TUBAL LIGATION     VAGINAL HYSTERECTOMY  2002   Dr Art Stefano Gaul   Social History   Occupational History   Occupation: RECEIVING CLERK    Employer: [employer]   Occupation: Armed forces operational officer  Tobacco Use   Smoking status: Never   Smokeless tobacco: Never  Vaping Use   Vaping status: Never Used  Substance and Sexual Activity   Alcohol use: Yes    Comment: occasional   Drug use: No   Sexual activity: Not Currently    Partners: Male

## 2023-02-18 ENCOUNTER — Other Ambulatory Visit: Payer: Self-pay | Admitting: Orthopaedic Surgery

## 2023-02-18 ENCOUNTER — Other Ambulatory Visit: Payer: Self-pay | Admitting: Family Medicine

## 2023-02-18 DIAGNOSIS — F419 Anxiety disorder, unspecified: Secondary | ICD-10-CM

## 2023-02-18 DIAGNOSIS — I10 Essential (primary) hypertension: Secondary | ICD-10-CM

## 2023-03-01 NOTE — Telephone Encounter (Signed)
 VOB submitted for Durolane, left knee.

## 2023-03-23 ENCOUNTER — Other Ambulatory Visit: Payer: Self-pay | Admitting: Family Medicine

## 2023-03-23 DIAGNOSIS — M8580 Other specified disorders of bone density and structure, unspecified site: Secondary | ICD-10-CM

## 2023-04-14 ENCOUNTER — Other Ambulatory Visit: Payer: Self-pay | Admitting: Family Medicine

## 2023-04-14 DIAGNOSIS — E559 Vitamin D deficiency, unspecified: Secondary | ICD-10-CM

## 2023-05-08 ENCOUNTER — Other Ambulatory Visit: Payer: Self-pay | Admitting: Family Medicine

## 2023-05-08 DIAGNOSIS — M8580 Other specified disorders of bone density and structure, unspecified site: Secondary | ICD-10-CM

## 2023-05-21 ENCOUNTER — Other Ambulatory Visit: Payer: Self-pay | Admitting: Family Medicine

## 2023-05-21 DIAGNOSIS — E785 Hyperlipidemia, unspecified: Secondary | ICD-10-CM

## 2023-06-10 ENCOUNTER — Other Ambulatory Visit: Payer: Self-pay

## 2023-06-10 ENCOUNTER — Emergency Department (HOSPITAL_COMMUNITY)
Admission: EM | Admit: 2023-06-10 | Discharge: 2023-06-10 | Disposition: A | Discharge status: Home / Self Care | Attending: Emergency Medicine | Admitting: Emergency Medicine

## 2023-06-10 ENCOUNTER — Emergency Department (HOSPITAL_COMMUNITY)

## 2023-06-10 DIAGNOSIS — R1031 Right lower quadrant pain: Secondary | ICD-10-CM | POA: Diagnosis present

## 2023-06-10 DIAGNOSIS — Z79899 Other long term (current) drug therapy: Secondary | ICD-10-CM | POA: Insufficient documentation

## 2023-06-10 DIAGNOSIS — I10 Essential (primary) hypertension: Secondary | ICD-10-CM | POA: Insufficient documentation

## 2023-06-10 DIAGNOSIS — N200 Calculus of kidney: Secondary | ICD-10-CM | POA: Diagnosis not present

## 2023-06-10 DIAGNOSIS — J45909 Unspecified asthma, uncomplicated: Secondary | ICD-10-CM | POA: Insufficient documentation

## 2023-06-10 LAB — CBC WITH DIFFERENTIAL/PLATELET
Abs Immature Granulocytes: 0.04 10*3/uL (ref 0.00–0.07)
Basophils Absolute: 0 10*3/uL (ref 0.0–0.1)
Basophils Relative: 1 %
Eosinophils Absolute: 0.1 10*3/uL (ref 0.0–0.5)
Eosinophils Relative: 1 %
HCT: 38.6 % (ref 36.0–46.0)
Hemoglobin: 12 g/dL (ref 12.0–15.0)
Immature Granulocytes: 1 %
Lymphocytes Relative: 16 %
Lymphs Abs: 1.4 10*3/uL (ref 0.7–4.0)
MCH: 28.3 pg (ref 26.0–34.0)
MCHC: 31.1 g/dL (ref 30.0–36.0)
MCV: 91 fL (ref 80.0–100.0)
Monocytes Absolute: 0.6 10*3/uL (ref 0.1–1.0)
Monocytes Relative: 7 %
Neutro Abs: 6.5 10*3/uL (ref 1.7–7.7)
Neutrophils Relative %: 74 %
Platelets: 289 10*3/uL (ref 150–400)
RBC: 4.24 MIL/uL (ref 3.87–5.11)
RDW: 13.5 % (ref 11.5–15.5)
WBC: 8.6 10*3/uL (ref 4.0–10.5)
nRBC: 0 % (ref 0.0–0.2)

## 2023-06-10 LAB — URINALYSIS, ROUTINE W REFLEX MICROSCOPIC
Bacteria, UA: NONE SEEN
Bilirubin Urine: NEGATIVE
Glucose, UA: NEGATIVE mg/dL
Ketones, ur: NEGATIVE mg/dL
Leukocytes,Ua: NEGATIVE
Nitrite: NEGATIVE
Protein, ur: 30 mg/dL — AB
RBC / HPF: >50 RBC/hpf (ref 0–5)
Specific Gravity, Urine: 1.025 (ref 1.005–1.030)
pH: 5 (ref 5.0–8.0)

## 2023-06-10 LAB — COMPREHENSIVE METABOLIC PANEL WITH GFR
ALT: 16 U/L (ref 0–44)
AST: 20 U/L (ref 15–41)
Albumin: 3.7 g/dL (ref 3.5–5.0)
Alkaline Phosphatase: 100 U/L (ref 38–126)
Anion gap: 8 (ref 5–15)
BUN: 20 mg/dL (ref 6–20)
CO2: 25 mmol/L (ref 22–32)
Calcium: 9.1 mg/dL (ref 8.9–10.3)
Chloride: 101 mmol/L (ref 98–111)
Creatinine, Ser: 0.78 mg/dL (ref 0.44–1.00)
GFR, Estimated: >60 mL/min (ref >60)
Glucose, Bld: 166 mg/dL — ABNORMAL HIGH (ref 70–99)
Potassium: 3.2 mmol/L — ABNORMAL LOW (ref 3.5–5.1)
Sodium: 134 mmol/L — ABNORMAL LOW (ref 135–145)
Total Bilirubin: 0.6 mg/dL (ref 0.0–1.2)
Total Protein: 6.9 g/dL (ref 6.5–8.1)

## 2023-06-10 LAB — LIPASE, BLOOD: Lipase: 32 U/L (ref 11–51)

## 2023-06-10 MED ORDER — ONDANSETRON 4 MG PO TBDP: 4.0000 mg | ORAL_TABLET | Freq: Three times a day (TID) | ORAL | 0 refills | Status: DC | PRN

## 2023-06-10 MED ORDER — POTASSIUM CHLORIDE CRYS ER 20 MEQ PO TBCR
40.0000 meq | EXTENDED_RELEASE_TABLET | Freq: Once | ORAL | Status: AC
  Administered 2023-06-10: 40 meq via ORAL
  Filled 2023-06-10: qty 2

## 2023-06-10 MED ORDER — TAMSULOSIN HCL 0.4 MG PO CAPS: 0.4000 mg | ORAL_CAPSULE | Freq: Every day | ORAL | 0 refills | Status: DC

## 2023-06-10 MED ORDER — OXYCODONE-ACETAMINOPHEN 5-325 MG PO TABS: 1.0000 | ORAL_TABLET | Freq: Four times a day (QID) | ORAL | 0 refills | Status: DC | PRN

## 2023-06-10 NOTE — ED Triage Notes (Addendum)
 Pt reports sudden right side flank pain that started this afternoon around 6 pm while sitting down lasting approx 10 min. Sts pain has been intermittent since - did break out into a sweat at the time. Denies radiation down right leg. No NVD. No CP/SOB. No abdominal pain. No urinary s/s.

## 2023-06-10 NOTE — ED Provider Notes (Signed)
 [place] EMERGENCY DEPARTMENT AT [facility] Provider Note   CSN: [id number] Arrival date & time: 06/10/23  1901     History  Chief Complaint  Patient presents with   Flank Pain    R    Anna Jackson is a 63 y.o. female.  Patient with history of hypertension, hyperlipidemia, non-Hodgkin's lymphoma in remission presents today with complaints of right flank pain.  She states that same began suddenly earlier today and was severe in nature and lasted a few minutes before resolving spontaneously without intervention.  States the pain was sharp and isolated to her right flank.  Pain did not radiate into her abdomen or down her leg.  States that the pain was so severe that she felt nauseous and broke out in a hot sweat.  This has happened 1 additional time this evening prompting her visit today.  Denies any history of similar pain previously.  No history of kidney stones.  No fevers or chills.  Denies any loss of bowel or bladder function or saddle paresthesias.  No numbness or tingling or weakness in her legs.  Currently her pain has resolved and she is asymptomatic.  No dysuria or hematuria.  The history is provided by the patient. No language interpreter was used.  Flank Pain       Home Medications Prior to Admission medications   Medication Sig Start Date End Date Taking? Authorizing Provider  albuterol  (VENTOLIN  HFA) 108 (90 Base) MCG/ACT inhaler Inhale 2 puffs into the lungs every 6 (six) hours as needed for wheezing or shortness of breath. 02/12/22   Roel Clarity R, DO  alendronate  (FOSAMAX ) 70 MG tablet TAKE 1 TABLET BY MOUTH ONCE A WEEK WITH  A  FULL  GLASS  OF  WATER   ON  AN  EMPTY  STOMACH 05/09/23   Crecencio Dodge, Candida Chalk, DO  ALPRAZolam  (XANAX ) 0.25 MG tablet Take 1 tablet (0.25 mg total) by mouth 3 (three) times daily as needed for anxiety. 08/27/22   Estill Hemming, DO  atorvastatin  (LIPITOR) 20 MG tablet Take 1 tablet (20 mg total) by mouth  daily. Pt needs office visit for further refills 05/23/23   Crecencio Dodge, Adel Holt R, DO  cyclobenzaprine  (FLEXERIL ) 5 MG tablet TAKE 1 TABLET BY MOUTH AT BEDTIME 02/21/23   Adah Acron, MD  docusate sodium  (COLACE) 100 MG capsule Take 300 mg by mouth in the morning.     [provider]  FLUoxetine  (PROZAC ) 40 MG capsule Take 1 capsule by mouth once daily 02/21/23   Crecencio Dodge, Yvonne R, DO  hydrochlorothiazide  (HYDRODIURIL ) 25 MG tablet Take 1 tablet by mouth once daily 02/21/23   Crecencio Dodge, Yvonne R, DO  potassium chloride  SA (KLOR-CON  M) 20 MEQ tablet Take 1 tablet (20 mEq total) by mouth 2 (two) times daily. 10/13/22   Estill Hemming, DO  Vitamin D , Ergocalciferol , (DRISDOL ) 1.25 MG (50000 UNIT) CAPS capsule Take 1 capsule by mouth once a week 04/15/23   Crecencio Dodge, Candida Chalk, DO      Allergies    Betadine [povidone iodine] and Codeine    Review of Systems   Review of Systems  Genitourinary:  Positive for flank pain.  All other systems reviewed and are negative.   Physical Exam Updated Vital Signs BP (!) 160/80 (BP Location: Left Arm)   Pulse 71   Temp 98.1 F (36.7 C) (Oral)   Resp 18   Ht 5\' 8"  (1.727 m)  Wt 104.3 kg   SpO2 100%   BMI 34.97 kg/m  Physical Exam Vitals and nursing note reviewed.  Constitutional:      General: She is not in acute distress.    Appearance: Normal appearance. She is normal weight. She is not ill-appearing, toxic-appearing or diaphoretic.  HENT:     Head: Normocephalic and atraumatic.  Cardiovascular:     Rate and Rhythm: Normal rate.  Pulmonary:     Effort: Pulmonary effort is normal. No respiratory distress.  Abdominal:     General: Abdomen is flat.     Palpations: Abdomen is soft.     Tenderness: There is no right CVA tenderness or left CVA tenderness.  Musculoskeletal:        General: Normal range of motion.     Cervical back: Normal range of motion.  Skin:    General: Skin is warm and dry.  Neurological:     General: No  focal deficit present.     Mental Status: She is alert.  Psychiatric:        Mood and Affect: Mood normal.        Behavior: Behavior normal.     ED Results / Procedures / Treatments   Labs (all labs ordered are listed, but only abnormal results are displayed) Labs Reviewed  URINALYSIS, ROUTINE W REFLEX MICROSCOPIC - Abnormal; Notable for the following components:      Result Value   APPearance HAZY (*)    Hgb urine dipstick LARGE (*)    Protein, ur 30 (*)    All other components within normal limits  COMPREHENSIVE METABOLIC PANEL WITH GFR - Abnormal; Notable for the following components:   Sodium 134 (*)    Potassium 3.2 (*)    Glucose, Bld 166 (*)    All other components within normal limits  LIPASE, BLOOD  CBC WITH DIFFERENTIAL/PLATELET    EKG None  Radiology CT Renal Stone Study Result Date: 06/10/2023 CLINICAL DATA:  Flank pain EXAM: CT ABDOMEN AND PELVIS WITHOUT CONTRAST TECHNIQUE: Multidetector CT imaging of the abdomen and pelvis was performed following the standard protocol without IV contrast. RADIATION DOSE REDUCTION: This exam was performed according to the departmental dose-optimization program which includes automated exposure control, adjustment of the mA and/or kV according to patient size and/or use of iterative reconstruction technique. COMPARISON:  None Available. FINDINGS: Lower chest: No acute abnormality. No pleural or pericardial effusions. Small hiatal hernia. Postop changes at the GE junction. Hepatobiliary: No focal liver abnormality is seen. Status post cholecystectomy. No biliary dilatation. Pancreas: Unremarkable. No pancreatic ductal dilatation or surrounding inflammatory changes. Spleen: Normal in size without focal abnormality. Adrenals/Urinary Tract: Adrenal glands are unremarkable. Proximal right ureteral 8 mm stone with obstruction. No nephrolithiasis on the left. Unremarkable urinary bladder. Stomach/Bowel: Stomach is within normal limits. Appendix  appears normal. No evidence of bowel wall thickening, distention, or inflammatory changes. Vascular/Lymphatic: No significant vascular findings are present. No enlarged abdominal or pelvic lymph nodes. Reproductive: Status post hysterectomy. No adnexal masses. Other: No abdominal wall hernia or abnormality. No abdominopelvic ascites. Musculoskeletal: No acute osseous abnormalities. Grade 1 L5 retrolisthesis. IMPRESSION: Proximal right ureteral 8 mm stone with obstruction. Electronically Signed   By: Sydell Eva M.D.   On: 06/10/2023 21:25    Procedures Procedures    Medications Ordered in ED Medications  potassium chloride  SA (KLOR-CON  M) CR tablet 40 mEq (40 mEq Oral Given 06/10/23 2145)    ED Course/ Medical Decision Making/ A&P  Medical Decision Making Amount and/or Complexity of Data Reviewed Labs: ordered. Radiology: ordered.  Risk Prescription drug management.   This patient is a 63 y.o. female who presents to the ED for concern of right flank pain, this involves an extensive number of treatment options, and is a complaint that carries with it a high risk of complications and morbidity. The emergent differential diagnosis prior to evaluation includes, but is not limited to,  AAA, renal vascular thrombosis, mesenteric ischemia, pyelonephritis, nephrolithiasis, cystitis, biliary colic, pancreatitis, PUD, appendicitis, diverticulitis, bowel obstruction, Ectopic Pregnancy, PID/TOA, Ovarian cyst, Ovarian torsion   This is not an exhaustive differential.   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  Additional history: Chart reviewed.   Physical Exam: Physical exam performed. The pertinent findings include: Well-appearing, abdomen soft and nontender.  No CVA tenderness. UA with RBCs, noninfectious  Lab Tests: I ordered, and personally interpreted labs.  The pertinent results include:  Na 134, K 3.2, glucose 166.   Imaging Studies: I ordered imaging studies including CT renal. I independently visualized and interpreted imaging which showed   Proximal right ureteral 8 mm stone with obstruction  I agree with the radiologist interpretation.   Medications: Offered pain medication which patient declined   Disposition: After consideration of the diagnostic results and the patients response to treatment, I feel that emergency department workup does not suggest an emergent condition requiring admission or immediate intervention beyond what has been performed at this time. The plan is: Discharge with pain medication, Zofran , and Flomax for kidney stone with referral to urology for follow-up.  Patient's urine is noninfectious, her pain is controlled without intervention at this time, she feels ready to go home.  Her kidney function is normal.  She does have an 8 mm kidney stone on the right side, likely the cause of her symptoms.  PDMP reviewed.  Patient advised not to drive or operate machinery while taking narcotic pain medications.  Return precautions discussed. Evaluation and diagnostic testing in the emergency department does not suggest an emergent condition requiring admission or immediate intervention beyond what has been performed at this time.  Plan for discharge with close PCP follow-up.  Patient is understanding  and amenable with plan, educated on red flag symptoms that would prompt immediate return.  Patient discharged in stable condition.  Final Clinical Impression(s) / ED Diagnoses Final diagnoses:  Kidney stone    Rx / DC Orders ED Discharge Orders          Ordered    oxyCODONE -acetaminophen  (PERCOCET/ROXICET) 5-325 MG tablet  Every 6 hours PRN        06/10/23 2226    ondansetron  (ZOFRAN -ODT) 4 MG disintegrating tablet  Every 8 hours PRN        06/10/23 2226    tamsulosin (FLOMAX) 0.4 MG CAPS capsule  Daily        06/10/23 2226          An After Visit Summary was printed and given to the patient.     Fredna Jasper 06/10/23 2226    Afton Horse  T, DO 06/11/23 1553

## 2023-06-10 NOTE — Discharge Instructions (Signed)
 As we discussed, you were seen in the emergency department and found to have a kidney stone.  Please take ibuprofen as needed for pain.  We are sending you home with multiple medications to assist with passing the stone and for residual pain/nausea:  -Flomax-this is a medication to help pass the stone, it allows urine to exit the body more freely.  Please take this once daily with a meal.  -Percocet-this is a narcotic/controlled substance medication that has potential addicting qualities.  We recommend that you take 1-2 tablets every 6 hours as needed for severe pain.  Do not drive or operate heavy machinery when taking this medicine as it can be sedating. Do not drink alcohol or take other sedating medications when taking this medicine for safety reasons.  Keep this out of reach of small children.  Please be aware this medicine has Tylenol in it (325 mg/tab) do not exceed the maximum dose of Tylenol in a day per over the counter recommendations should you decide to supplement with Tylenol over the counter.   -Zofran-this is an antinausea medication, you may take this every 8 hours as needed for nausea and vomiting, please allow the tablet to dissolve underneath of your tongue.   We have prescribed you new medication(s) today. Discuss the medications prescribed today with your pharmacist as they can have adverse effects and interactions with your other medicines including over the counter and prescribed medications. Seek medical evaluation if you start to experience new or abnormal symptoms after taking one of these medicines, seek care immediately if you start to experience difficulty breathing, feeling of your throat closing, facial swelling, or rash as these could be indications of a more serious allergic reaction  Please follow-up with the urology group provided in your discharge instructions within 3 to 5 days.  Return to the ER for new or worsening symptoms including but not limited to worsening  pain not controlled by these medicines, inability to keep fluids down, fever, or any other concerns that you may have.

## 2023-06-14 ENCOUNTER — Encounter: Payer: Self-pay | Admitting: Family Medicine

## 2023-06-14 ENCOUNTER — Other Ambulatory Visit: Payer: Self-pay

## 2023-06-14 ENCOUNTER — Encounter: Payer: Self-pay | Admitting: Urology

## 2023-06-14 ENCOUNTER — Ambulatory Visit: Admitting: Family Medicine

## 2023-06-14 ENCOUNTER — Ambulatory Visit (HOSPITAL_BASED_OUTPATIENT_CLINIC_OR_DEPARTMENT_OTHER)
Admission: RE | Admit: 2023-06-14 | Discharge: 2023-06-14 | Disposition: A | Discharge status: Home / Self Care | Source: Ambulatory Visit | Attending: Urology | Admitting: Urology

## 2023-06-14 ENCOUNTER — Inpatient Hospital Stay: Admitting: Family Medicine

## 2023-06-14 ENCOUNTER — Telehealth: Payer: Self-pay | Admitting: Urology

## 2023-06-14 ENCOUNTER — Encounter: Admitting: Urology

## 2023-06-14 ENCOUNTER — Ambulatory Visit (INDEPENDENT_AMBULATORY_CARE_PROVIDER_SITE_OTHER): Admitting: Urology

## 2023-06-14 VITALS — BP 133/85 | HR 65 | Ht 68.0 in | Wt 230.0 lb

## 2023-06-14 VITALS — BP 112/80 | HR 71 | Temp 97.9°F | Resp 18 | Ht 68.0 in | Wt 225.8 lb

## 2023-06-14 DIAGNOSIS — M8580 Other specified disorders of bone density and structure, unspecified site: Secondary | ICD-10-CM

## 2023-06-14 DIAGNOSIS — N200 Calculus of kidney: Secondary | ICD-10-CM | POA: Insufficient documentation

## 2023-06-14 DIAGNOSIS — N202 Calculus of kidney with calculus of ureter: Secondary | ICD-10-CM | POA: Diagnosis not present

## 2023-06-14 DIAGNOSIS — M170 Bilateral primary osteoarthritis of knee: Secondary | ICD-10-CM

## 2023-06-14 DIAGNOSIS — N201 Calculus of ureter: Secondary | ICD-10-CM | POA: Insufficient documentation

## 2023-06-14 LAB — URINALYSIS, ROUTINE W REFLEX MICROSCOPIC
Glucose, UA: NEGATIVE
Nitrite, UA: NEGATIVE
Specific Gravity, UA: 1.025 (ref 1.005–1.030)
Urobilinogen, Ur: 0.2 mg/dL (ref 0.2–1.0)
pH, UA: 5.5 (ref 5.0–7.5)

## 2023-06-14 LAB — MICROSCOPIC EXAMINATION

## 2023-06-14 MED ORDER — DICLOFENAC SODIUM 2 % EX SOLN: 2.0000 | Freq: Two times a day (BID) | CUTANEOUS | 3 refills | Status: DC | PRN

## 2023-06-14 MED ORDER — ALENDRONATE SODIUM 70 MG PO TABS: 70.0000 mg | ORAL_TABLET | ORAL | 2 refills | Status: DC

## 2023-06-14 NOTE — Telephone Encounter (Signed)
-----   Message from Joliet J sent at 06/13/2023  8:43 AM EDT ----- This one too, you let me know and I will call all of them ----- Message ----- From: Mellie Sprinkle., MD Sent: 06/11/2023  11:17 AM EDT To: Lucetta Russel  Patient in ER 4/25 with kidney stone. Please contact her to schedule a f/u appt in next week with KUB

## 2023-06-14 NOTE — H&P (View-Only) (Signed)
 Assessment: 1. Ureteral calculus; right   2. Nephrolithiasis; RIGHT     Plan: I personally reviewed the patient's chart including provider notes, lab and imaging results. I personally reviewed the CT study from 06/09/2023 with results as noted below. I reviewed the KUB study from today.  There is a 3 x 7 mm calcification located in the mid right renal shadow consistent with proximal migration of the right ureteral calculus.  Treatment options discussed with the patient including spontaneous passage, shockwave lithotripsy, and ureteroscopy. Following our discussion, she would like to proceed with shockwave lithotripsy. Procedure: The patient will be scheduled for right ESL at [facility].  Surgical request is placed with the surgery schedulers and will be scheduled at the patient's/family request. Informed consent is given as documented below. Anesthesia:  local  The patient does not have sleep apnea, history of MRSA, history of VRE, history of cardiac device requiring special anesthetic needs. Patient is stable and considered clear for surgical management in an outpatient ambulatory surgery setting as well as inpatient hospital setting.  Consent for Operation or Procedure: Provider Certification I hereby certify that the nature, purpose, benefits, usual and most frequent risks of, and alternatives to, the operation or procedure have been explained to the patient (or person authorized to sign for the patient) either by me as responsible physician or by the provider who is to perform the operation or procedure. Time spent such that the patient/family has had an opportunity to ask questions, and that those questions have been answered. The patient or the patient's representative has been advised that selected tasks may be performed by assistants to the primary health care provider(s). I believe that the patient (or person authorized to sign for the patient) understands what has been explained,  and has consented to the operation or procedure. No guarantees were implied or made.   Chief Complaint:  Chief Complaint  Patient presents with   Nephrolithiasis    History of Present Illness:  Anna Jackson is a 63 y.o. female who is seen for evaluation of a right ureteral calculus. She was seen in the emergency room on 06/10/2023 with acute onset of right-sided flank pain. Evaluation showed a normal white count, normal renal function, and no evidence of UTI. CT imaging showed a 8 mm stone in the proximal right ureter with obstruction; no other stones seen. She has only had slight pain since leaving the ER.  No nausea or vomiting.  No fevers or chills.  She does report some difficulty voiding.  No dysuria or gross hematuria.  No prior history of kidney stones.   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   Mental disorder    Neuropathic pain 04/04/2014   Non Hodgkin's lymphoma (HCC) 2015   Skin infection 11/02/2013   Unspecified asthma(493.90)    Unspecified essential hypertension    URI (upper respiratory infection) 01/15/2014   Vitamin D  deficiency  Past Surgical History:  Past Surgical History:  Procedure Laterality Date   AXILLARY LYMPH NODE DISSECTION Left 03/09/2013   Procedure: EXCISION LEFT AXILLARY LYMPH NODES;  Surgeon: Levert Ready, MD;  Location: [facility] ORS;  Service: General;  Laterality: Left;   BREAST BIOPSY Left 02/13/2013   high risk   BREAST EXCISIONAL BIOPSY Left 03/09/2013   high risk    CERVICAL CONIZATION W/BX     CHOLECYSTECTOMY  1995   Open, done with Hiatal hernia repair   ESOPHAGEAL MANOMETRY N/A 01/12/2017   Procedure: ESOPHAGEAL MANOMETRY (EM);  Surgeon: Albertina Hugger, MD;  Location: [facility] ENDOSCOPY;  Service: Gastroenterology;  Laterality: N/A;   INCISIONAL HERNIA REPAIR  2004   Dr Salena Craven.  Parietex 20x15cm mesh   KNEE SURGERY Right 02/2010   Dr Mevelyn Acton, Arthroscopic   NISSEN FUNDOPLICATION  1995   OPEN, Dr Dellie Fergusson   ORIF WRIST FRACTURE Right 12/25/2019   Procedure: #1 right wrist/radius open reduction internal fixation comminuted complex distal radius fracture with DVR Biomet cross lock plate and screw construct.  This was a greater than 3 part intra-articular fracture.  #2 AP lateral and oblique x-rays performed examined and interpreted by myself #3 evaluation anesthesia ulnar styloid fracture with subsequent closed treatment based upon stability    PORTACATH PLACEMENT N/A 03/23/2013   Procedure: INSERTION PORT-A-CATH;  Surgeon: Levert Ready, MD;  Location: [facility];  Service: General;  Laterality: N/A;   TUBAL LIGATION     VAGINAL HYSTERECTOMY  2002   Dr Art Harlon Light    Allergies:  Allergies  Allergen Reactions   Betadine [Povidone Iodine] Hives and Itching   Codeine Nausea And Vomiting    Family History:  Family History  Problem Relation Age of Onset   Heart disease Mother    High blood pressure Mother    High Cholesterol Mother    Anxiety disorder Mother    Heart disease Father    High Cholesterol Father    High blood pressure Father    Heart disease Brother    Alcoholism Brother    Breast cancer Maternal Grandmother 48   Cancer Maternal Grandmother 21       breast   Diabetes Cousin    Coronary artery disease Other        with hernia repair   Migraines Other    Hypertension Other    Colon cancer Neg Hx     Social History:  Social History   Tobacco Use   Smoking status: Never   Smokeless tobacco: Never  Vaping Use    Vaping status: Never Used  Substance Use Topics   Alcohol use: Yes    Comment: occasional   Drug use: No    Review of symptoms:  Constitutional:  Negative for unexplained weight loss, night sweats, fever, chills ENT:  Negative for nose bleeds, sinus pain, painful swallowing CV:  Negative for chest pain, shortness of breath, exercise intolerance, palpitations, loss of consciousness Resp:  Negative for cough, wheezing, shortness of breath GI:  Negative for nausea, vomiting, diarrhea, bloody stools GU:  Positives noted in HPI; otherwise negative for gross hematuria, dysuria, urinary incontinence Neuro:  Negative for seizures, poor balance, limb weakness, slurred speech Psych:  Negative for lack of energy, depression, anxiety Endocrine:  Negative for polydipsia, polyuria, symptoms of hypoglycemia (dizziness, hunger, sweating) Hematologic:  Negative for anemia, purpura, petechia, prolonged or excessive bleeding, use of anticoagulants  Allergic:  Negative for difficulty breathing or choking as a result of  exposure to anything; no shellfish allergy; no allergic response (rash/itch) to materials, foods  Physical exam: BP 133/85   Pulse 65   Ht 5\' 8"  (1.727 m)   Wt 230 lb (104.3 kg)   BMI 34.97 kg/m  GENERAL APPEARANCE:  Well appearing, well developed, well nourished, NAD HEENT: Atraumatic, Normocephalic, oropharynx clear. NECK: Supple without lymphadenopathy or thyromegaly. LUNGS: Clear to auscultation bilaterally. HEART: Regular Rate and Rhythm without murmurs, gallops, or rubs. ABDOMEN: Soft, non-tender, No Masses. EXTREMITIES: Moves all extremities well.  Without clubbing, cyanosis, or edema. NEUROLOGIC:  Alert and oriented x 3, normal gait, CN II-XII grossly intact.  MENTAL STATUS:  Appropriate. BACK:  Non-tender to palpation.  No CVAT SKIN:  Warm, dry and intact.    Results: U/A:  0-5 WBC, 3-10 RBC

## 2023-06-14 NOTE — Progress Notes (Addendum)
 Assessment: 1. Ureteral calculus; right   2. Nephrolithiasis; RIGHT     Plan: I personally reviewed the patient's chart including provider notes, lab and imaging results. I personally reviewed the CT study from 06/09/2023 with results as noted below. I reviewed the KUB study from today.  There is a 3 x 7 mm calcification located in the mid right renal shadow consistent with proximal migration of the right ureteral calculus.  Treatment options discussed with the patient including spontaneous passage, shockwave lithotripsy, and ureteroscopy. Following our discussion, she would like to proceed with shockwave lithotripsy. Procedure: The patient will be scheduled for right ESL at [facility].  Surgical request is placed with the surgery schedulers and will be scheduled at the patient's/family request. Informed consent is given as documented below. Anesthesia:  local  The patient does not have sleep apnea, history of MRSA, history of VRE, history of cardiac device requiring special anesthetic needs. Patient is stable and considered clear for surgical management in an outpatient ambulatory surgery setting as well as inpatient hospital setting.  Consent for Operation or Procedure: Provider Certification I hereby certify that the nature, purpose, benefits, usual and most frequent risks of, and alternatives to, the operation or procedure have been explained to the patient (or person authorized to sign for the patient) either by me as responsible physician or by the provider who is to perform the operation or procedure. Time spent such that the patient/family has had an opportunity to ask questions, and that those questions have been answered. The patient or the patient's representative has been advised that selected tasks may be performed by assistants to the primary health care provider(s). I believe that the patient (or person authorized to sign for the patient) understands what has been explained,  and has consented to the operation or procedure. No guarantees were implied or made.   Chief Complaint:  Chief Complaint  Patient presents with   Nephrolithiasis    History of Present Illness:  Anna Jackson is a 63 y.o. female who is seen for evaluation of a right ureteral calculus. She was seen in the emergency room on 06/10/2023 with acute onset of right-sided flank pain. Evaluation showed a normal white count, normal renal function, and no evidence of UTI. CT imaging showed a 8 mm stone in the proximal right ureter with obstruction; no other stones seen. She has only had slight pain since leaving the ER.  No nausea or vomiting.  No fevers or chills.  She does report some difficulty voiding.  No dysuria or gross hematuria.  No prior history of kidney stones.   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   Mental disorder    Neuropathic pain 04/04/2014   Non Hodgkin's lymphoma (HCC) 2015   Skin infection 11/02/2013   Unspecified asthma(493.90)    Unspecified essential hypertension    URI (upper respiratory infection) 01/15/2014   Vitamin D  deficiency  Past Surgical History:  Past Surgical History:  Procedure Laterality Date   AXILLARY LYMPH NODE DISSECTION Left 03/09/2013   Procedure: EXCISION LEFT AXILLARY LYMPH NODES;  Surgeon: Levert Ready, MD;  Location: [facility];  Service: General;  Laterality: Left;   BREAST BIOPSY Left 02/13/2013   high risk   BREAST EXCISIONAL BIOPSY Left 03/09/2013   high risk    CERVICAL CONIZATION W/BX     CHOLECYSTECTOMY  1995   Open, done with Hiatal hernia repair   ESOPHAGEAL MANOMETRY N/A 01/12/2017   Procedure: ESOPHAGEAL MANOMETRY (EM);  Surgeon: Albertina Hugger, MD;  Location: [facility];  Service: Gastroenterology;  Laterality: N/A;   INCISIONAL HERNIA REPAIR  2004   Dr Salena Craven.  Parietex 20x15cm mesh   KNEE SURGERY Right 02/2010   Dr Mevelyn Acton, Arthroscopic   NISSEN FUNDOPLICATION  1995   OPEN, Dr Dellie Fergusson   ORIF WRIST FRACTURE Right 12/25/2019   Procedure: #1 right wrist/radius open reduction internal fixation comminuted complex distal radius fracture with DVR Biomet cross lock plate and screw construct.  This was a greater than 3 part intra-articular fracture.  #2 AP lateral and oblique x-rays performed examined and interpreted by myself #3 evaluation anesthesia ulnar styloid fracture with subsequent closed treatment based upon stability    PORTACATH PLACEMENT N/A 03/23/2013   Procedure: INSERTION PORT-A-CATH;  Surgeon: Levert Ready, MD;  Location: [facility];  Service: General;  Laterality: N/A;   TUBAL LIGATION     VAGINAL HYSTERECTOMY  2002   Dr Art Harlon Light    Allergies:  Allergies  Allergen Reactions   Betadine [Povidone Iodine] Hives and Itching   Codeine Nausea And Vomiting    Family History:  Family History  Problem Relation Age of Onset   Heart disease Mother    High blood pressure Mother    High Cholesterol Mother    Anxiety disorder Mother    Heart disease Father    High Cholesterol Father    High blood pressure Father    Heart disease Brother    Alcoholism Brother    Breast cancer Maternal Grandmother 48   Cancer Maternal Grandmother 21       breast   Diabetes Cousin    Coronary artery disease Other        with hernia repair   Migraines Other    Hypertension Other    Colon cancer Neg Hx     Social History:  Social History   Tobacco Use   Smoking status: Never   Smokeless tobacco: Never  Vaping Use    Vaping status: Never Used  Substance Use Topics   Alcohol use: Yes    Comment: occasional   Drug use: No    Review of symptoms:  Constitutional:  Negative for unexplained weight loss, night sweats, fever, chills ENT:  Negative for nose bleeds, sinus pain, painful swallowing CV:  Negative for chest pain, shortness of breath, exercise intolerance, palpitations, loss of consciousness Resp:  Negative for cough, wheezing, shortness of breath GI:  Negative for nausea, vomiting, diarrhea, bloody stools GU:  Positives noted in HPI; otherwise negative for gross hematuria, dysuria, urinary incontinence Neuro:  Negative for seizures, poor balance, limb weakness, slurred speech Psych:  Negative for lack of energy, depression, anxiety Endocrine:  Negative for polydipsia, polyuria, symptoms of hypoglycemia (dizziness, hunger, sweating) Hematologic:  Negative for anemia, purpura, petechia, prolonged or excessive bleeding, use of anticoagulants  Allergic:  Negative for difficulty breathing or choking as a result of  exposure to anything; no shellfish allergy; no allergic response (rash/itch) to materials, foods  Physical exam: BP 133/85   Pulse 65   Ht 5\' 8"  (1.727 m)   Wt 230 lb (104.3 kg)   BMI 34.97 kg/m  GENERAL APPEARANCE:  Well appearing, well developed, well nourished, NAD HEENT: Atraumatic, Normocephalic, oropharynx clear. NECK: Supple without lymphadenopathy or thyromegaly. LUNGS: Clear to auscultation bilaterally. HEART: Regular Rate and Rhythm without murmurs, gallops, or rubs. ABDOMEN: Soft, non-tender, No Masses. EXTREMITIES: Moves all extremities well.  Without clubbing, cyanosis, or edema. NEUROLOGIC:  Alert and oriented x 3, normal gait, CN II-XII grossly intact.  MENTAL STATUS:  Appropriate. BACK:  Non-tender to palpation.  No CVAT SKIN:  Warm, dry and intact.    Results: U/A:  0-5 WBC, 3-10 RBC

## 2023-06-14 NOTE — Telephone Encounter (Signed)
LVM for patient to call office back to get scheduled.

## 2023-06-14 NOTE — Progress Notes (Signed)
 Established Patient Office Visit  Subjective   Patient ID: Anna Jackson, female    DOB: 12-20-1960  Age: 63 y.o. MRN: [id number]  Chief Complaint  Patient presents with   ED follow up    Pt states she has not passed the kidney stones but states she is having a procedure on Monday to remove them    HPI Discussed the use of AI scribe software for clinical note transcription with the patient, who gave verbal consent to proceed.  History of Present Illness Anna Jackson is a 63 year old female who presents for a follow-up after an emergency room visit for a kidney stone.  She has an 8 mm kidney stone that was initially located in the ureter but has since moved back into the kidney, causing significant pain. During an emergency room visit, she was prescribed Percocet for pain management, Zofran , and Flomax  to assist with passing the stone. She has only taken half a Percocet since Sunday.  She experiences bilateral knee pain due to 'bone on bone' arthritis, with the left knee being more painful. She has been delaying knee replacement surgery due to work commitments and uses ibuprofen  for pain relief, which provides minimal relief. She was previously prescribed a low dose of oxycodone  and a muscle relaxer, but these have not been effective. She has not tried Voltaren  gel or other topical treatments.  She experienced a severe pain attack while driving to a baseball game, which required her to pull over due to the intensity of the pain. This was her second pain attack.  She works as an Scientist, water quality and is in the process of training a replacement before she can proceed with knee surgery. She has been with her current employer for 23 years.  She reports significant pain in her knees and has difficulty walking, requiring the use of a mobility aid at times. No other new symptoms.   Patient Active Problem List   Diagnosis Date Noted   Ureteral calculus; right 06/14/2023   Kidney  stone 06/14/2023   Osteopenia 06/14/2023   Primary osteoarthritis of both knees 06/14/2023   Unilateral primary osteoarthritis, left knee 12/07/2022   Spondylolisthesis, lumbar region 12/07/2022   DOE (dyspnea on exertion) 10/12/2022   Bronchitis 02/12/2022   Vitamin D  deficiency 12/01/2021   Depression 12/01/2021   Class 2 severe obesity with serious comorbidity and body mass index (BMI) of 35.0 to 35.9 in adult [facility]) 12/01/2021   Chest pain 07/02/2021   Other fatigue 02/25/2021   Lymphoma, 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   Major depressive disorder with single episode, in full remission (HCC) 12/09/2020   Closed fracture of distal end of right radius 12/26/2019   Right wrist fracture 12/25/2019   Angina at rest [facility]) 08/02/2019   Hypokalemia 07/28/2019   Chest pain, rule out acute myocardial infarction 07/27/2019   Recurrent hiatal hernia s/p robotic PEH repair/Nissen 03/16/2017 03/16/2017   Hiatal hernia    Acute bronchitis 06/14/2016   B12 deficiency 10/10/2014   Neuropathic pain 04/04/2014   Hyperlipidemia 03/12/2014   Obesity (BMI 30-39.9) 01/08/2014   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   Headache 05/16/2013   History of lymphoma 03/20/2013   Obesity, morbid (HCC)    Barrett's esophagus 08/10/2010   Heme positive stool 06/30/2010   Depression, major, single episode, in partial remission (HCC) 03/04/2009   GERD 12/08/2007   POSTMENOPAUSAL STATUS 11/21/2007   Anxiety state 05/09/2007   Pain in joint, lower leg 10/20/2006  HERPES SIMPLEX, UNCOMPLICATED 05/27/2006   ONYCHOMYCOSIS, TOENAILS 05/27/2006   Primary hypertension 04/26/2006   Asthma 04/26/2006   HEADACHE 04/26/2006   UMBILICAL HERNIORRHAPHY, HX OF 04/26/2006   Past Medical History:  Diagnosis Date   Adenopathy    left axillary   Anemia    Anxiety    Anxiety state, unspecified    Asthma    no problem with asthma in several years   Barrett esophagus    Chest pain    Constipation     Depression    Esophageal reflux    GERD (gastroesophageal reflux disease)    Headache 05/16/2013   Herpes simplex without mention of complication    Hyperlipidemia 03/12/2014   Hypertension    Joint pain    Lymphoma, 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   Mental disorder    Neuropathic pain 04/04/2014   Non Hodgkin's lymphoma (HCC) 2015   Skin infection 11/02/2013   Unspecified asthma(493.90)    Unspecified essential hypertension    URI (upper respiratory infection) 01/15/2014   Vitamin D  deficiency    Past Surgical History:  Procedure Laterality Date   AXILLARY LYMPH NODE DISSECTION Left 03/09/2013   Procedure: EXCISION LEFT AXILLARY LYMPH NODES;  Surgeon: Levert Ready, MD;  Location: [facility];  Service: General;  Laterality: Left;   BREAST BIOPSY Left 02/13/2013   high risk   BREAST EXCISIONAL BIOPSY Left 03/09/2013   high risk   CERVICAL CONIZATION W/BX     CHOLECYSTECTOMY  1995   Open, done with Hiatal hernia repair   ESOPHAGEAL MANOMETRY N/A 01/12/2017   Procedure: ESOPHAGEAL MANOMETRY (EM);  Surgeon: Albertina Hugger, MD;  Location: [facility];  Service: Gastroenterology;  Laterality: N/A;   INCISIONAL HERNIA REPAIR  2004   Dr Salena Craven.  Parietex 20x15cm mesh   KNEE SURGERY Right 02/2010   Dr Mevelyn Acton, Arthroscopic   NISSEN FUNDOPLICATION  1995   OPEN, Dr Dellie Fergusson   ORIF WRIST FRACTURE Right 12/25/2019   Procedure: #1 right wrist/radius open reduction internal fixation comminuted complex distal radius fracture with DVR Biomet cross lock plate and screw construct.  This was a greater than 3 part intra-articular fracture.  #2 AP lateral and oblique x-rays performed examined and interpreted by myself #3 evaluation anesthesia ulnar styloid fracture with subsequent closed treatment based upon stability    PORTACATH PLACEMENT N/A 03/23/2013   Procedure: INSERTION PORT-A-CATH;  Surgeon: Levert Ready, MD;  Location: [facility] OR;  Service: General;  Laterality: N/A;   TUBAL LIGATION      VAGINAL HYSTERECTOMY  2002   Dr Art Harlon Light   Social History   Tobacco Use   Smoking status: Never   Smokeless tobacco: Never  Vaping Use   Vaping status: Never Used  Substance Use Topics   Alcohol use: Yes    Comment: occasional   Drug use: No   Social History   Socioeconomic History   Marital status: Significant Other    Spouse name: Not on file   Number of children: 1   Years of education: Not on file   Highest education level: GED or equivalent  Occupational History   Occupation: RECEIVING Marine scientist: [employer]   Occupation: Armed forces operational officer  Tobacco Use   Smoking status: Never   Smokeless tobacco: Never  Vaping Use   Vaping status: Never Used  Substance and Sexual Activity   Alcohol use: Yes    Comment: occasional   Drug use: No   Sexual activity: Not  Currently    Partners: Male  Other Topics Concern   Not on file  Social History Narrative   Exercise-- walking   Social Drivers of Health   Financial Resource Strain: Low Risk  (06/14/2023)   Overall Financial Resource Strain (CARDIA)    Difficulty of Paying Living Expenses: Not very hard  Food Insecurity: Food Insecurity Present (06/14/2023)   Hunger Vital Sign    Worried About Running Out of Food in the Last Year: Sometimes true    Ran Out of Food in the Last Year: Sometimes true  Transportation Needs: No Transportation Needs (06/14/2023)   PRAPARE - Administrator, Civil Service (Medical): No    Lack of Transportation (Non-Medical): No  Physical Activity: Unknown (06/14/2023)   Exercise Vital Sign    Days of Exercise per Week: 0 days    Minutes of Exercise per Session: Not on file  Stress: No Stress Concern Present (06/14/2023)   [employer] of Occupational Health - Occupational Stress Questionnaire    Feeling of Stress : Only a little  Social Connections: Moderately Integrated (06/14/2023)   Social Connection and Isolation Panel [NHANES]    Frequency of Communication with  Friends and Family: More than three times a week    Frequency of Social Gatherings with Friends and Family: More than three times a week    Attends Religious Services: 1 to 4 times per year    Active Member of [employer] or Organizations: No    Attends Engineer, structural: Not on file    Marital Status: Living with partner  Intimate Partner Violence: Not on file   Family Status  Relation Name Status   Mother  Deceased   Father  Deceased   Brother  Deceased at age 41   MGM  (Not Specified)   Building control surveyor   Other  (Not Specified)   Other  (Not Specified)   Other  (Not Specified)   Neg Hx  (Not Specified)  No partnership data on file   Family History  Problem Relation Age of Onset   Heart disease Mother    High blood pressure Mother    High Cholesterol Mother    Anxiety disorder Mother    Heart disease Father    High Cholesterol Father    High blood pressure Father    Heart disease Brother    Alcoholism Brother    Breast cancer Maternal Grandmother 48   Cancer Maternal Grandmother 50       breast   Diabetes Cousin    Coronary artery disease Other        with hernia repair   Migraines Other    Hypertension Other    Colon cancer Neg Hx    Allergies  Allergen Reactions   Betadine [Povidone Iodine] Hives and Itching   Codeine Nausea And Vomiting      ROS    Objective:     BP 112/80 (BP Location: Left Arm, Patient Position: Sitting, Cuff Size: Large)   Pulse 71   Temp 97.9 F (36.6 C) (Oral)   Resp 18   Ht 5\' 8"  (1.727 m)   Wt 225 lb 12.8 oz (102.4 kg)   SpO2 97%   BMI 34.33 kg/m  BP Readings from Last 3 Encounters:  06/14/23 112/80  06/14/23 133/85  06/10/23 (!) 160/80   Wt Readings from Last 3 Encounters:  06/14/23 225 lb 12.8 oz (102.4 kg)  06/14/23 230 lb (104.3 kg)  06/10/23 230 lb (  104.3 kg)   SpO2 Readings from Last 3 Encounters:  06/14/23 97%  06/10/23 100%  10/12/22 97%      Physical Exam   Results for orders placed or  performed in visit on 06/14/23  Microscopic Examination   Urine  Result Value Ref Range   WBC, UA 0-5 0 - 5 /hpf   RBC, Urine 3-10 (A) 0 - 2 /hpf   Epithelial Cells (non renal) 0-10 0 - 10 /hpf   Crystals Present N/A   Crystal Type Calcium  Oxalate N/A   Mucus, UA Present (A) Not Estab.   Bacteria, UA Few None seen/Few  Urinalysis, Routine w reflex microscopic  Result Value Ref Range   Specific Gravity, UA 1.025 1.005 - 1.030   pH, UA 5.5 5.0 - 7.5   Color, UA Yellow Yellow   Appearance Ur Clear Clear   Leukocytes,UA Trace (A) Negative   Protein,UA Trace Negative/Trace   Glucose, UA Negative Negative   Ketones, UA Trace (A) Negative   RBC, UA 2+ (A) Negative   Bilirubin, UA 1+ (A) Negative   Urobilinogen, Ur 0.2 0.2 - 1.0 mg/dL   Nitrite, UA Negative Negative   Microscopic Examination See below:     Last CBC Lab Results  Component Value Date   WBC 8.6 06/10/2023   HGB 12.0 06/10/2023   HCT 38.6 06/10/2023   MCV 91.0 06/10/2023   MCH 28.3 06/10/2023   RDW 13.5 06/10/2023   PLT 289 06/10/2023   Last metabolic panel Lab Results  Component Value Date   GLUCOSE 166 (H) 06/10/2023   NA 134 (L) 06/10/2023   K 3.2 (L) 06/10/2023   CL 101 06/10/2023   CO2 25 06/10/2023   BUN 20 06/10/2023   CREATININE 0.78 06/10/2023   GFRNONAA >60 06/10/2023   CALCIUM  9.1 06/10/2023   PROT 6.9 06/10/2023   ALBUMIN 3.7 06/10/2023   LABGLOB 2.1 08/12/2021   AGRATIO 2.0 08/12/2021   BILITOT 0.6 06/10/2023   ALKPHOS 100 06/10/2023   AST 20 06/10/2023   ALT 16 06/10/2023   ANIONGAP 8 06/10/2023   Last lipids Lab Results  Component Value Date   CHOL 141 08/27/2022   HDL 86 08/27/2022   LDLCALC 38 08/27/2022   LDLDIRECT 105.0 11/23/2010   TRIG 83 08/27/2022   CHOLHDL 1.6 08/27/2022   Last hemoglobin A1c Lab Results  Component Value Date   HGBA1C 5.7 (H) 08/27/2022   Last thyroid  functions Lab Results  Component Value Date   TSH 0.70 10/12/2022   T4TOTAL 9.1 10/12/2022    Last vitamin D  Lab Results  Component Value Date   VD25OH 48.56 10/12/2022   Last vitamin B12 and Folate Lab Results  Component Value Date   VITAMINB12 454 10/12/2022      The 10-year ASCVD risk score (Arnett DK, et al., 2019) is: 1.9%    Assessment & Plan:   Problem List Items Addressed This Visit       Unprioritized   Primary osteoarthritis of both knees   Relevant Medications   diclofenac  Sodium (PENNSAID ) 2 % SOLN   Osteopenia   Relevant Medications   alendronate  (FOSAMAX ) 70 MG tablet   Kidney stone - Primary   Pt to have lithotripsy  F/u as needed      Assessment and Plan Assessment & Plan Kidney stone with obstruction   An 8 mm kidney stone is causing obstruction, initially located in the ureter but has moved back to the kidney, leading to severe pain attacks and  an emergency room visit. She is scheduled for lithotripsy to fragment the stone for easier passage, choosing this method over surgery due to its shorter recovery time and less invasiveness. Lithotripsy is expected to break the stone into sand-like particles, which may pass unnoticed. Proceed with scheduled lithotripsy. Advise against working the day after the procedure to allow for recovery. Use a strainer to monitor for passage of stone fragments post-procedure. Continue Percocet for pain, Zofran  for nausea, and Flomax  to facilitate stone passage.  Bilateral knee osteoarthritis   Chronic bilateral knee osteoarthritis causes significant pain, particularly in the left knee, making walking and standing difficult. Previous treatment with low-dose oxycodone  and muscle relaxers was ineffective, and ibuprofen  provides minimal relief. She is considering knee replacement surgery but has postponed it due to work commitments. Recommend over-the-counter Voltaren  gel for topical pain relief. Suggest trying frankincense essential oil and balm for additional pain management. Consider prescription strength gel if covered by  insurance; otherwise, use over-the-counter Voltaren  gel.    No follow-ups on file.    Khadir Roam R Lowne Chase, DO

## 2023-06-15 ENCOUNTER — Ambulatory Visit: Payer: Self-pay | Admitting: Urology

## 2023-06-15 ENCOUNTER — Encounter (HOSPITAL_COMMUNITY): Payer: Self-pay | Admitting: Urology

## 2023-06-15 DIAGNOSIS — N200 Calculus of kidney: Secondary | ICD-10-CM

## 2023-06-15 NOTE — Progress Notes (Signed)
 Spoke w/ via phone for pre-op interview---[facility] needs dos----KUB              COVID test -----patient states asymptomatic no test needed Arrive at -------1145 NPO after MN NO Solid Food.  Clear liquids from MN until---0745 Med rec completed. Pt aware to hold ASA/NSAIDs and supplements per [facility] protocol.  Medications to take morning of surgery -----Oxycodone , xanax , prozac , zofran , flexeril  Diabetic/Weight loss medication ----- No Alcohol or recreational drugs for 24 hours/Tobacco products for 6 hours ---- Patient instructed to bring blue lithotripsy folder, photo id and insurance card day of surgery. Patient aware to have Driver (ride ) / caregiver for 24 hours after surgery -----Madge Schiller [phone] Patient Special Instructions -----bring blue folder and wear comfortable clothes Pre-Op special Instructions ----- take laxative of choice day before procedure Patient verbalized understanding of instructions that were given at this phone interview. Patient denies shortness of breath, chest pain, fever, cough at this phone interview.

## 2023-06-18 ENCOUNTER — Encounter: Payer: Self-pay | Admitting: Family Medicine

## 2023-06-18 NOTE — Patient Instructions (Signed)
Kidney Stones  Kidney stones are solid, rock-like deposits that form inside of the kidneys. The kidneys are a pair of organs that make urine. A kidney stone may form in a kidney and move into other parts of the urinary tract, including the tubes that connect the kidneys to the bladder (ureters), the bladder, and the tube that carries urine out of the body (urethra). As the stone moves through these areas, it can cause intense pain and block the flow of urine. Kidney stones are created when high levels of certain minerals are found in the urine. The stones are usually passed out of the body through urination, but in some cases, medical treatment may be needed to remove them. What are the causes? Kidney stones may be caused by: A condition in which certain glands produce too much parathyroid hormone (primary hyperparathyroidism), which causes too much calcium buildup in the blood. A buildup of uric acid crystals in the bladder (hyperuricosuria). Uric acid is a chemical that the body produces when you eat certain foods. It usually leaves the body in the urine. Narrowing (stricture) of one or both of the ureters. A kidney blockage that is present at birth (congenital obstruction). Past surgery on the kidney or the ureters. What increases the risk? The following factors may make you more likely to develop this condition: Having had a kidney stone in the past. Having a family history of kidney stones. Not drinking enough water. Eating a diet that is high in protein, salt (sodium), or sugar. Being overweight or obese. What are the signs or symptoms? Symptoms of a kidney stone may include: Pain in the side of the abdomen, right below the ribs (flank pain). Pain usually spreads (radiates) to the groin. Needing to urinate often or urgently. Painful urination. Blood in the urine (hematuria). Nausea. Vomiting. Fever and chills. How is this diagnosed? This condition may be diagnosed based on: Your  symptoms and medical history. A physical exam. Blood tests. Urine tests. These may be done before and after the stone passes out of your body through urination. Imaging tests, such as a CT scan, abdominal X-ray, or ultrasound. A procedure to examine the inside of the bladder (cystoscopy). How is this treated? Treatment for kidney stones depends on the size, location, and makeup of the stones. Kidney stones will often pass out of the body through urination. You may need to: Increase your fluid intake to help pass the stone. In some cases, you may be given fluids through an IV and may need to be monitored in the hospital. Take medicine for pain. Make changes in your diet to help prevent kidney stones from coming back. Sometimes, procedures are needed to remove a kidney stone. This may involve: A procedure to break up kidney stones using: A focused beam of light (laser therapy). Shock waves (extracorporeal shock wave lithotripsy). Surgery to remove kidney stones. This may be needed if you have severe pain or have stones that block your urinary tract. Follow these instructions at home: Medicines Take over-the-counter and prescription medicines only as told by your health care provider. Ask your health care provider if the medicine prescribed to you requires you to avoid driving or using heavy machinery. Eating and drinking Drink enough fluid to keep your urine pale yellow. You may be instructed to drink at least 8-10 glasses of water each day. This will help you pass the kidney stone. If directed, change your diet. This may include: Limiting how much sodium you eat. Eating more fruits   and vegetables. Limiting how much animal protein you eat. Animal proteins include red meat, poultry, fish, and eggs. Eating a normal amount of calcium (1,000-1,300 mg per day). Follow instructions from your health care provider about eating or drinking restrictions. General instructions Collect urine samples as  told by your health care provider. You may need to collect a urine sample: 24 hours after you pass the stone. 8-12 weeks after you pass the kidney stone, and every 6-12 months after that. Strain your urine every time you urinate, for as long as directed. Use the strainer that your health care provider recommends. Do not throw out the kidney stone after passing it. Keep the stone so it can be tested by your health care provider. Testing the makeup of your kidney stone may help prevent you from getting kidney stones in the future. Keep all follow-up visits. You may need follow-up X-rays or ultrasounds to make sure that your stone has passed. How is this prevented? To prevent another kidney stone: Drink enough fluid to keep your urine pale yellow. This is the best way to prevent kidney stones. Eat a healthy diet. Follow recommendations from your health care provider about foods to avoid. Recommendations vary depending on the type of kidney stone that you have. You may be instructed to eat a low-protein diet. Maintain a healthy weight. Where to find more information National Kidney Foundation (NKF): www.kidney.org [facility] (UCF): www.urologyhealth.org Contact a health care provider if: You have pain that gets worse or does not get better with medicine. Get help right away if: You have a fever or chills. You develop severe pain. You develop new abdominal pain. You faint. You are unable to urinate. Summary Kidney stones are solid, rock-like deposits that form inside of the kidneys. Kidney stones can cause nausea, vomiting, blood in the urine, abdominal pain, and the urge to urinate often. Treatment for kidney stones depends on the size, location, and makeup of the stones. Kidney stones will often pass out of the body through urination. Kidney stones can be prevented by drinking enough fluids, eating a healthy diet, and maintaining a healthy weight. This information is not intended  to replace advice given to you by your health care provider. Make sure you discuss any questions you have with your health care provider. Document Revised: 05/13/2021 Document Reviewed: 05/13/2021 Elsevier Patient Education  2024 Elsevier Inc.  

## 2023-06-18 NOTE — Assessment & Plan Note (Signed)
 Pt to have lithotripsy  F/u as needed

## 2023-06-20 ENCOUNTER — Encounter (HOSPITAL_COMMUNITY)
Admission: RE | Disposition: A | Discharge status: Home / Self Care | Payer: Self-pay | Source: Home / Self Care | Attending: Urology

## 2023-06-20 ENCOUNTER — Ambulatory Visit (HOSPITAL_COMMUNITY)
Admission: RE | Admit: 2023-06-20 | Discharge: 2023-06-20 | Disposition: A | Discharge status: Home / Self Care | Source: Home / Self Care | Attending: Urology | Admitting: Urology

## 2023-06-20 ENCOUNTER — Other Ambulatory Visit: Payer: Self-pay

## 2023-06-20 ENCOUNTER — Encounter (HOSPITAL_COMMUNITY): Payer: Self-pay | Admitting: Urology

## 2023-06-20 ENCOUNTER — Ambulatory Visit (HOSPITAL_COMMUNITY)

## 2023-06-20 DIAGNOSIS — N202 Calculus of kidney with calculus of ureter: Secondary | ICD-10-CM | POA: Insufficient documentation

## 2023-06-20 DIAGNOSIS — R109 Unspecified abdominal pain: Secondary | ICD-10-CM | POA: Diagnosis not present

## 2023-06-20 DIAGNOSIS — N200 Calculus of kidney: Secondary | ICD-10-CM

## 2023-06-20 DIAGNOSIS — N179 Acute kidney failure, unspecified: Secondary | ICD-10-CM | POA: Diagnosis not present

## 2023-06-20 HISTORY — PX: EXTRACORPOREAL SHOCK WAVE LITHOTRIPSY: SHX1557

## 2023-06-20 SURGERY — LITHOTRIPSY, ESWL
Anesthesia: LOCAL | Laterality: Right

## 2023-06-20 MED ORDER — DIAZEPAM 5 MG PO TABS
10.0000 mg | ORAL_TABLET | ORAL | Status: AC
  Administered 2023-06-20: 10 mg via ORAL
  Filled 2023-06-20: qty 2

## 2023-06-20 MED ORDER — KETOROLAC TROMETHAMINE 15 MG/ML IJ SOLN
15.0000 mg | Freq: Once | INTRAMUSCULAR | Status: AC
  Administered 2023-06-20: 15 mg via INTRAVENOUS
  Filled 2023-06-20: qty 1

## 2023-06-20 MED ORDER — DIPHENHYDRAMINE HCL 25 MG PO CAPS
25.0000 mg | ORAL_CAPSULE | ORAL | Status: AC
Start: 2023-06-20 — End: 2023-06-20
  Administered 2023-06-20: 25 mg via ORAL
  Filled 2023-06-20: qty 1

## 2023-06-20 MED ORDER — CIPROFLOXACIN HCL 500 MG PO TABS
500.0000 mg | ORAL_TABLET | ORAL | Status: AC
  Administered 2023-06-20: 500 mg via ORAL
  Filled 2023-06-20: qty 1

## 2023-06-20 MED ORDER — SODIUM CHLORIDE 0.9 % IV SOLN
INTRAVENOUS | Status: DC
  Administered 2023-06-20: 1000 mL via INTRAVENOUS

## 2023-06-20 NOTE — Op Note (Signed)
 Operative Note  Procedure:  right shock wave lithotripsy  Please see Operative Note from [facility] scanned into chart.  Milderd Meager, MD [facility] [phone]

## 2023-06-20 NOTE — Interval H&P Note (Signed)
 History and Physical Interval Note:  06/20/2023 1:08 PM  Anna Jackson  has presented today for surgery, with the diagnosis of Right nephrolithiasis Ureteral calculus.  The various methods of treatment have been discussed with the patient and family. After consideration of risks, benefits and other options for treatment, the patient has consented to  Procedure(s): LITHOTRIPSY, ESWL (Right) as a surgical intervention.  The patient's history has been reviewed, patient examined, no change in status, stable for surgery.  I have reviewed the patient's chart and labs.  Questions were answered to the patient's satisfaction.     [name]

## 2023-06-21 ENCOUNTER — Encounter (HOSPITAL_COMMUNITY): Payer: Self-pay | Admitting: Urology

## 2023-06-21 ENCOUNTER — Other Ambulatory Visit: Payer: Self-pay | Admitting: Urology

## 2023-06-21 ENCOUNTER — Telehealth: Payer: Self-pay | Admitting: Urology

## 2023-06-21 ENCOUNTER — Other Ambulatory Visit (HOSPITAL_COMMUNITY): Payer: Self-pay

## 2023-06-21 MED ORDER — OXYCODONE-ACETAMINOPHEN 5-325 MG PO TABS: 1.0000 | ORAL_TABLET | Freq: Four times a day (QID) | ORAL | 0 refills | Status: DC | PRN

## 2023-06-21 NOTE — Telephone Encounter (Signed)
 Patient called and is still experiencing a lot of pain after her lithotripsy, ESWL yesterday. Pt states she has 5 of the oxyCODONE -acetaminophen  (PERCOCET/ROXICET) 5-325 MG tablet [[id number]] left that they had prescribed her on 06/10/23 in the ED but they are not touching it. Please advise.

## 2023-06-22 ENCOUNTER — Other Ambulatory Visit: Payer: Self-pay

## 2023-06-22 ENCOUNTER — Telehealth: Payer: Self-pay | Admitting: Urology

## 2023-06-22 ENCOUNTER — Inpatient Hospital Stay (HOSPITAL_BASED_OUTPATIENT_CLINIC_OR_DEPARTMENT_OTHER)
Admission: EM | Admit: 2023-06-22 | Discharge: 2023-06-24 | DRG: 683 | Disposition: A | Discharge status: Home / Self Care | Attending: Internal Medicine | Admitting: Internal Medicine

## 2023-06-22 ENCOUNTER — Encounter (HOSPITAL_BASED_OUTPATIENT_CLINIC_OR_DEPARTMENT_OTHER): Payer: Self-pay | Admitting: Emergency Medicine

## 2023-06-22 ENCOUNTER — Emergency Department (HOSPITAL_BASED_OUTPATIENT_CLINIC_OR_DEPARTMENT_OTHER)

## 2023-06-22 DIAGNOSIS — Z8249 Family history of ischemic heart disease and other diseases of the circulatory system: Secondary | ICD-10-CM | POA: Diagnosis not present

## 2023-06-22 DIAGNOSIS — R109 Unspecified abdominal pain: Secondary | ICD-10-CM | POA: Diagnosis present

## 2023-06-22 DIAGNOSIS — Z79899 Other long term (current) drug therapy: Secondary | ICD-10-CM

## 2023-06-22 DIAGNOSIS — Z818 Family history of other mental and behavioral disorders: Secondary | ICD-10-CM | POA: Diagnosis not present

## 2023-06-22 DIAGNOSIS — Z803 Family history of malignant neoplasm of breast: Secondary | ICD-10-CM | POA: Diagnosis not present

## 2023-06-22 DIAGNOSIS — N179 Acute kidney failure, unspecified: Principal | ICD-10-CM

## 2023-06-22 DIAGNOSIS — E860 Dehydration: Secondary | ICD-10-CM

## 2023-06-22 DIAGNOSIS — F411 Generalized anxiety disorder: Secondary | ICD-10-CM | POA: Diagnosis not present

## 2023-06-22 DIAGNOSIS — Z6834 Body mass index (BMI) 34.0-34.9, adult: Secondary | ICD-10-CM | POA: Diagnosis not present

## 2023-06-22 DIAGNOSIS — Z8572 Personal history of non-Hodgkin lymphomas: Secondary | ICD-10-CM

## 2023-06-22 DIAGNOSIS — Z9049 Acquired absence of other specified parts of digestive tract: Secondary | ICD-10-CM

## 2023-06-22 DIAGNOSIS — N1330 Unspecified hydronephrosis: Secondary | ICD-10-CM

## 2023-06-22 DIAGNOSIS — Z7983 Long term (current) use of bisphosphonates: Secondary | ICD-10-CM

## 2023-06-22 DIAGNOSIS — M810 Age-related osteoporosis without current pathological fracture: Secondary | ICD-10-CM | POA: Diagnosis present

## 2023-06-22 DIAGNOSIS — N200 Calculus of kidney: Secondary | ICD-10-CM

## 2023-06-22 DIAGNOSIS — Z883 Allergy status to other anti-infective agents status: Secondary | ICD-10-CM

## 2023-06-22 DIAGNOSIS — I10 Essential (primary) hypertension: Secondary | ICD-10-CM | POA: Diagnosis not present

## 2023-06-22 DIAGNOSIS — Z90710 Acquired absence of both cervix and uterus: Secondary | ICD-10-CM

## 2023-06-22 DIAGNOSIS — N132 Hydronephrosis with renal and ureteral calculous obstruction: Secondary | ICD-10-CM | POA: Diagnosis present

## 2023-06-22 DIAGNOSIS — E669 Obesity, unspecified: Secondary | ICD-10-CM | POA: Diagnosis present

## 2023-06-22 DIAGNOSIS — J45909 Unspecified asthma, uncomplicated: Secondary | ICD-10-CM

## 2023-06-22 DIAGNOSIS — K219 Gastro-esophageal reflux disease without esophagitis: Secondary | ICD-10-CM | POA: Diagnosis not present

## 2023-06-22 DIAGNOSIS — E876 Hypokalemia: Secondary | ICD-10-CM

## 2023-06-22 DIAGNOSIS — Z87442 Personal history of urinary calculi: Secondary | ICD-10-CM

## 2023-06-22 DIAGNOSIS — Z833 Family history of diabetes mellitus: Secondary | ICD-10-CM | POA: Diagnosis not present

## 2023-06-22 DIAGNOSIS — Z83438 Family history of other disorder of lipoprotein metabolism and other lipidemia: Secondary | ICD-10-CM

## 2023-06-22 DIAGNOSIS — G8918 Other acute postprocedural pain: Secondary | ICD-10-CM | POA: Diagnosis not present

## 2023-06-22 DIAGNOSIS — F32A Depression, unspecified: Secondary | ICD-10-CM | POA: Diagnosis not present

## 2023-06-22 DIAGNOSIS — Z885 Allergy status to narcotic agent status: Secondary | ICD-10-CM | POA: Diagnosis not present

## 2023-06-22 DIAGNOSIS — E785 Hyperlipidemia, unspecified: Secondary | ICD-10-CM

## 2023-06-22 DIAGNOSIS — C859A Non-Hodgkin lymphoma, unspecified, in remission: Secondary | ICD-10-CM | POA: Diagnosis present

## 2023-06-22 DIAGNOSIS — R52 Pain, unspecified: Secondary | ICD-10-CM

## 2023-06-22 DIAGNOSIS — R112 Nausea with vomiting, unspecified: Secondary | ICD-10-CM

## 2023-06-22 LAB — COMPREHENSIVE METABOLIC PANEL WITH GFR
ALT: 16 U/L (ref 0–44)
AST: 20 U/L (ref 15–41)
Albumin: 3.6 g/dL (ref 3.5–5.0)
Alkaline Phosphatase: 113 U/L (ref 38–126)
Anion gap: 9 (ref 5–15)
BUN: 12 mg/dL (ref 6–20)
CO2: 27 mmol/L (ref 22–32)
Calcium: 8.9 mg/dL (ref 8.9–10.3)
Chloride: 102 mmol/L (ref 98–111)
Creatinine, Ser: 1.15 mg/dL — ABNORMAL HIGH (ref 0.44–1.00)
GFR, Estimated: 55 mL/min — ABNORMAL LOW (ref >60)
Glucose, Bld: 133 mg/dL — ABNORMAL HIGH (ref 70–99)
Potassium: 3 mmol/L — ABNORMAL LOW (ref 3.5–5.1)
Sodium: 138 mmol/L (ref 135–145)
Total Bilirubin: 1.2 mg/dL (ref 0.0–1.2)
Total Protein: 6.7 g/dL (ref 6.5–8.1)

## 2023-06-22 LAB — CBC WITH DIFFERENTIAL/PLATELET
Abs Immature Granulocytes: 0.04 10*3/uL (ref 0.00–0.07)
Basophils Absolute: 0 10*3/uL (ref 0.0–0.1)
Basophils Relative: 0 %
Eosinophils Absolute: 0.2 10*3/uL (ref 0.0–0.5)
Eosinophils Relative: 2 %
HCT: 36.2 % (ref 36.0–46.0)
Hemoglobin: 11.8 g/dL — ABNORMAL LOW (ref 12.0–15.0)
Immature Granulocytes: 0 %
Lymphocytes Relative: 16 %
Lymphs Abs: 1.4 10*3/uL (ref 0.7–4.0)
MCH: 28.7 pg (ref 26.0–34.0)
MCHC: 32.6 g/dL (ref 30.0–36.0)
MCV: 88.1 fL (ref 80.0–100.0)
Monocytes Absolute: 0.9 10*3/uL (ref 0.1–1.0)
Monocytes Relative: 10 %
Neutro Abs: 6.7 10*3/uL (ref 1.7–7.7)
Neutrophils Relative %: 72 %
Platelets: 245 10*3/uL (ref 150–400)
RBC: 4.11 MIL/uL (ref 3.87–5.11)
RDW: 13.3 % (ref 11.5–15.5)
WBC: 9.2 10*3/uL (ref 4.0–10.5)
nRBC: 0 % (ref 0.0–0.2)

## 2023-06-22 LAB — OSMOLALITY: Osmolality: 294 mosm/kg (ref 275–295)

## 2023-06-22 LAB — URINALYSIS, ROUTINE W REFLEX MICROSCOPIC
Bilirubin Urine: NEGATIVE
Glucose, UA: NEGATIVE mg/dL
Ketones, ur: NEGATIVE mg/dL
Leukocytes,Ua: NEGATIVE
Nitrite: NEGATIVE
Protein, ur: NEGATIVE mg/dL
Specific Gravity, Urine: 1.025 (ref 1.005–1.030)
pH: 5.5 (ref 5.0–8.0)

## 2023-06-22 LAB — URINALYSIS, MICROSCOPIC (REFLEX)

## 2023-06-22 LAB — HIV ANTIBODY (ROUTINE TESTING W REFLEX): HIV Screen 4th Generation wRfx: NONREACTIVE

## 2023-06-22 LAB — PHOSPHORUS: Phosphorus: 3.6 mg/dL (ref 2.5–4.6)

## 2023-06-22 LAB — BASIC METABOLIC PANEL WITH GFR
Anion gap: 12 (ref 5–15)
BUN: 13 mg/dL (ref 6–20)
CO2: 27 mmol/L (ref 22–32)
Calcium: 9.7 mg/dL (ref 8.9–10.3)
Chloride: 102 mmol/L (ref 98–111)
Creatinine, Ser: 1.28 mg/dL — ABNORMAL HIGH (ref 0.44–1.00)
GFR, Estimated: 48 mL/min — ABNORMAL LOW (ref >60)
Glucose, Bld: 91 mg/dL (ref 70–99)
Potassium: 3.6 mmol/L (ref 3.5–5.1)
Sodium: 140 mmol/L (ref 135–145)

## 2023-06-22 LAB — MAGNESIUM: Magnesium: 1.9 mg/dL (ref 1.7–2.4)

## 2023-06-22 LAB — CK: Total CK: 110 U/L (ref 38–234)

## 2023-06-22 MED ORDER — ONDANSETRON HCL 4 MG/2ML IJ SOLN
4.0000 mg | Freq: Once | INTRAMUSCULAR | Status: AC
  Administered 2023-06-22: 4 mg via INTRAVENOUS
  Filled 2023-06-22: qty 2

## 2023-06-22 MED ORDER — ALPRAZOLAM 0.25 MG PO TABS: 0.2500 mg | ORAL_TABLET | Freq: Three times a day (TID) | ORAL | Status: DC | PRN

## 2023-06-22 MED ORDER — OXYCODONE-ACETAMINOPHEN 5-325 MG PO TABS: 1.0000 | ORAL_TABLET | Freq: Four times a day (QID) | ORAL | Status: DC | PRN

## 2023-06-22 MED ORDER — ACETAMINOPHEN 650 MG RE SUPP: 650.0000 mg | Freq: Four times a day (QID) | RECTAL | Status: DC | PRN

## 2023-06-22 MED ORDER — OXYCODONE HCL 5 MG PO TABS
10.0000 mg | ORAL_TABLET | Freq: Once | ORAL | Status: AC
  Administered 2023-06-22: 10 mg via ORAL
  Filled 2023-06-22: qty 2

## 2023-06-22 MED ORDER — HYDROMORPHONE HCL 1 MG/ML IJ SOLN
0.5000 mg | INTRAMUSCULAR | Status: DC | PRN
  Administered 2023-06-22 – 2023-06-23 (×4): 1 mg via INTRAVENOUS
  Filled 2023-06-22 (×4): qty 1

## 2023-06-22 MED ORDER — SODIUM CHLORIDE 0.9 % IV BOLUS
1000.0000 mL | Freq: Once | INTRAVENOUS | Status: AC
  Administered 2023-06-22: 1000 mL via INTRAVENOUS

## 2023-06-22 MED ORDER — HYDROMORPHONE HCL 1 MG/ML IJ SOLN
1.0000 mg | INTRAMUSCULAR | Status: DC | PRN
  Administered 2023-06-22: 1 mg via INTRAVENOUS
  Filled 2023-06-22: qty 1

## 2023-06-22 MED ORDER — ACETAMINOPHEN 325 MG PO TABS
650.0000 mg | ORAL_TABLET | Freq: Four times a day (QID) | ORAL | Status: DC | PRN
  Administered 2023-06-22 – 2023-06-24 (×3): 650 mg via ORAL
  Filled 2023-06-22 (×3): qty 2

## 2023-06-22 MED ORDER — TAMSULOSIN HCL 0.4 MG PO CAPS
0.4000 mg | ORAL_CAPSULE | Freq: Every day | ORAL | Status: DC
  Administered 2023-06-22 – 2023-06-23 (×2): 0.4 mg via ORAL
  Filled 2023-06-22 (×2): qty 1

## 2023-06-22 MED ORDER — FLUOXETINE HCL 20 MG PO CAPS
40.0000 mg | ORAL_CAPSULE | Freq: Every day | ORAL | Status: DC
  Administered 2023-06-22 – 2023-06-24 (×3): 40 mg via ORAL
  Filled 2023-06-22 (×3): qty 2

## 2023-06-22 MED ORDER — ONDANSETRON HCL 4 MG/2ML IJ SOLN
4.0000 mg | Freq: Four times a day (QID) | INTRAMUSCULAR | Status: DC | PRN
  Administered 2023-06-22 – 2023-06-23 (×2): 4 mg via INTRAVENOUS
  Filled 2023-06-22 (×2): qty 2

## 2023-06-22 MED ORDER — LACTATED RINGERS IV SOLN: INTRAVENOUS | Status: DC

## 2023-06-22 MED ORDER — HYDROMORPHONE HCL 1 MG/ML IJ SOLN
1.0000 mg | Freq: Once | INTRAMUSCULAR | Status: AC
  Administered 2023-06-22: 1 mg via INTRAVENOUS
  Filled 2023-06-22: qty 1

## 2023-06-22 MED ORDER — METOCLOPRAMIDE HCL 5 MG/ML IJ SOLN: 5.0000 mg | Freq: Four times a day (QID) | INTRAMUSCULAR | Status: DC | PRN

## 2023-06-22 MED ORDER — POTASSIUM CHLORIDE CRYS ER 20 MEQ PO TBCR
40.0000 meq | EXTENDED_RELEASE_TABLET | Freq: Once | ORAL | Status: AC
  Administered 2023-06-22: 40 meq via ORAL
  Filled 2023-06-22: qty 2

## 2023-06-22 NOTE — ED Provider Notes (Signed)
 [facility] Provider Note   CSN: [id number] Arrival date & time: 06/22/23  1004     History  Chief Complaint  Patient presents with   Flank Pain    Anna Jackson is a 63 y.o. female who presents emergency department with a chief complaint of flank pain.  Patient underwent shockwave lithotripsy of a 3 x 7 mm proximal ureteral stone on the right side with Dr. Willye Harvey on 06/20/2023.  Anna Jackson presents to the emergency department for tractable pain despite her medications.  Patient reports since that time Anna Jackson has had intractable pain, retching, nausea.  Anna Jackson states that Anna Jackson has passed a few "very tiny stones."  But since that time has continue to have severe pain hematuria and right flank pain.   Flank Pain       Home Medications Prior to Admission medications   Medication Sig Start Date End Date Taking? Authorizing Provider  alendronate  (FOSAMAX ) 70 MG tablet Take 1 tablet (70 mg total) by mouth once a week. Take with a full glass of water  on an empty stomach. 06/14/23   Crecencio Dodge, Candida Chalk, DO  ALPRAZolam  (XANAX ) 0.25 MG tablet Take 1 tablet (0.25 mg total) by mouth 3 (three) times daily as needed for anxiety. 08/27/22   Estill Hemming, DO  atorvastatin  (LIPITOR) 20 MG tablet Take 1 tablet (20 mg total) by mouth daily. Pt needs office visit for further refills 05/23/23   Crecencio Dodge, Adel Holt R, DO  cyclobenzaprine  (FLEXERIL ) 5 MG tablet TAKE 1 TABLET BY MOUTH AT BEDTIME 02/21/23   Adah Acron, MD  docusate sodium  (COLACE) 100 MG capsule Take 300 mg by mouth in the morning.     [provider]  FLUoxetine  (PROZAC ) 40 MG capsule Take 1 capsule by mouth once daily 02/21/23   Crecencio Dodge, Yvonne R, DO  hydrochlorothiazide  (HYDRODIURIL ) 25 MG tablet Take 1 tablet by mouth once daily 02/21/23   Crecencio Dodge, Yvonne R, DO  ondansetron  (ZOFRAN -ODT) 4 MG disintegrating tablet Take 1 tablet (4 mg total) by mouth every 8 (eight) hours as  needed. 06/10/23   Smoot, Genevive Ket, PA-C  oxyCODONE -acetaminophen  (PERCOCET/ROXICET) 5-325 MG tablet Take 1 tablet by mouth every 6 (six) hours as needed for severe pain (pain score 7-10). 06/21/23   Stoneking, Ponce Brisker., MD  potassium chloride  SA (KLOR-CON  M) 20 MEQ tablet Take 1 tablet (20 mEq total) by mouth 2 (two) times daily. 10/13/22   Estill Hemming, DO  tamsulosin  (FLOMAX ) 0.4 MG CAPS capsule Take 1 capsule (0.4 mg total) by mouth daily. 06/10/23   Smoot, Genevive Ket, PA-C  Vitamin D , Ergocalciferol , (DRISDOL ) 1.25 MG (50000 UNIT) CAPS capsule Take 1 capsule by mouth once a week 04/15/23   Crecencio Dodge, Candida Chalk, DO      Allergies    Betadine [povidone iodine] and Codeine    Review of Systems   Review of Systems  Genitourinary:  Positive for flank pain.    Physical Exam Updated Vital Signs Ht 5\' 8"  (1.727 m)   Wt 101.6 kg   BMI 34.06 kg/m  Physical Exam .Physical Exam  Nursing note and vitals reviewed. Constitutional: Anna Jackson is oriented to person, place, and time. Anna Jackson appears well-developed and well-nourished appears uncomfortable. HENT:  Head: Normocephalic and atraumatic.  Eyes: Conjunctivae normal and EOM are normal. Pupils are equal, round, and reactive to light. No scleral icterus.  Neck: Normal range of motion.  Cardiovascular: Normal rate, regular rhythm  and normal heart sounds.  Exam reveals no gallop and no friction rub.   No murmur heard. Pulmonary/Chest: Effort normal and breath sounds normal. No respiratory distress.  Abdominal: Soft. Bowel sounds are normal. Anna Jackson exhibits no distension and no mass. There is no tenderness. There is no guarding.  Right CVA tenderness Neurological: Anna Jackson is alert and oriented to person, place, and time.  Skin: Skin is warm and dry. Anna Jackson is not diaphoretic.   ED Results / Procedures / Treatments   Labs (all labs ordered are listed, but only abnormal results are displayed) Labs Reviewed  CBC WITH DIFFERENTIAL/PLATELET  BASIC METABOLIC  PANEL WITH GFR  URINALYSIS, ROUTINE W REFLEX MICROSCOPIC    EKG None  Radiology DG Abd 1 View Result Date: 06/20/2023 CLINICAL DATA:  Right nephrolithiasis. EXAM: ABDOMEN - 1 VIEW COMPARISON:  Radiograph 06/14/2023.  CT 06/10/2023 FINDINGS: 6 mm calcification to the right of L3 likely corresponds to ureteral stone. The additional right renal calcifications on prior CT are not well seen by radiograph. Surgical clips in the upper abdomen. Tacks from prior abdominal hernia repair. Normal bowel gas pattern. IMPRESSION: A 6 mm calcification to the right of L3 likely corresponds to ureteral stone. Electronically Signed   By: Chadwick Colonel M.D.   On: 06/20/2023 15:45    Procedures Procedures    Medications Ordered in ED Medications  HYDROmorphone  (DILAUDID ) injection 1 mg (has no administration in time range)  ondansetron  (ZOFRAN ) injection 4 mg (has no administration in time range)    ED Course/ Medical Decision Making/ A&P Clinical Course as of 06/22/23 1733  Wed Jun 22, 2023  1104 Creatinine(!): 1.28 Creatinine up to 1.28 from 0.78 [AH]  1327 Case discussed with APP Irvin Mantel that the bump in the creatinine is likely secondary to the vomiting and poor oral intake over the past few days.  Anna Jackson has no evidence of obstructive uropathy on her CT image.  White count within normal limits.  Current plan is to give the patient more fluids make sure that Anna Jackson is tolerating p.o.'s and if Anna Jackson is still currently pain-free then we will let her go home with close follow-up for recheck of her creatinine levels. [AH]  1544 Has been get in with IV and p.o. pain medications after fluid resuscitation.  Her pain is currently 7 out of 10.  Anna Jackson is nauseous her pain is difficult to control.  Will consult with urology again to admit for pain control and acute kidney injury as Anna Jackson continues to receive IV fluid resuscitation and pain control.  [AH]  1549 Can discussed with Dr. Valeta Gaudier of urology who  recommends medicine admit [AH]  1608 Case discussed with Dr. Thelma Fire he will admit the patient for fluid resuscitation and pain control. [AH]    Clinical Course User Index [AH] Minor Iden, PA-C   .                              Medical Decision Making Amount and/or Complexity of Data Reviewed Labs: ordered. Decision-making details documented in ED Course. Radiology: ordered.  Risk Prescription drug management. Decision regarding hospitalization.   This patient presents to the ED for concern of flank pain status post lithotripsy, this involves an extensive number of treatment options, and is a complaint that carries with it a high risk of complications and morbidity.  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   Social Determinants of Health:   SDOH Screenings   Food Insecurity: Food Insecurity Present (06/14/2023)  Housing: Low Risk  (06/14/2023)  Transportation Needs: No Transportation Needs (06/14/2023)  Depression (PHQ2-9): Low Risk  (08/27/2022)  Financial Resource Strain: Low Risk  (06/14/2023)  Physical Activity: Unknown (06/14/2023)  Social Connections: Moderately Integrated (06/14/2023)  Stress: No Stress Concern Present (06/14/2023)  Tobacco Use: Low Risk  (06/22/2023)     Additional  history:  {Additional history obtained from emr   Lab Tests:  I Ordered, and personally interpreted labs.  The pertinent results include:    As per ED course Urine does not appear infected  Imaging Studies:  I ordered imaging studies including CT renal stone study I independently visualized and interpreted imaging which showed multiple small stones less than 3 mm along the right ureter I agree with the radiologist interpretation   Medicines ordered and prescription drug management:  I ordered medication including  Medications  lactated ringers  infusion ( Intravenous New Bag/Given 06/22/23 1636)  ondansetron  (ZOFRAN ) injection 4 mg (4 mg Intravenous Given 06/22/23 1636)  HYDROmorphone  (DILAUDID ) injection 1 mg (1 mg Intravenous Given 06/22/23 1637)  HYDROmorphone  (DILAUDID ) injection 1 mg (1 mg Intravenous Given 06/22/23 1027)  ondansetron  (ZOFRAN ) injection 4 mg (4 mg Intravenous Given 06/22/23 1024)  sodium chloride  0.9 % bolus 1,000 mL (0 mLs Intravenous Stopped 06/22/23 1435)  oxyCODONE  (Oxy IR/ROXICODONE ) immediate release tablet 10 mg (10 mg Oral Given 06/22/23 1500)   for pain dehydration and nausea Reevaluation of the patient after these medicines showed that the patient stayed the same I have reviewed the patients home medicines and have made adjustments as needed  Test Considered:    Critical Interventions:    Consultations Obtained: As per ED course  Problem List / ED Course:  No diagnosis found.  MDM: 63 year old female with recent lithotripsy now here with intractable pain.  Anna Jackson has associated AKI but does not appear to have severe obstruction.  Patient pain is uncontrolled especially after fluid resuscitation will need admission for pain control and fluid resuscitation.  Urology will consult.   Dispostion:  After consideration of the diagnostic results and the patients response to treatment, I feel that the patent would benefit from  admission.         Final Clinical Impression(s) / ED Diagnoses Final diagnoses:  None    Rx / DC Orders ED Discharge Orders     None         Tama Fails, PA-C 06/22/23 1748    Lowery Rue, DO 07/01/23 2249

## 2023-06-22 NOTE — Assessment & Plan Note (Signed)
 Will rehydrate with IV fluids encourage p.o. intake May benefit from orthostatics prior to discharge

## 2023-06-22 NOTE — Assessment & Plan Note (Signed)
 Due to  dehydration rehydrate and order urine lytes

## 2023-06-22 NOTE — Subjective & Objective (Addendum)
 Pt had shockwave  lithotripsy 5/5 by Dr. Willye Harvey with Urology of a 3 x 7 mm proximal ureteral stone on the right side. She has been having pain and N/V. Pain not controlled with oxycodone . Has been able  to pass some smaller stones but still in severe pain. Pt presented to [facility] Cr noted up to 1.28 from 0.78. Urology consulted and felt She has no evidence of obstructive uropathy on her CT image Dr. Valeta Gaudier of urology  recommended medicine admit

## 2023-06-22 NOTE — Assessment & Plan Note (Signed)
 Hold hydrochlorothiazide  given dehydration

## 2023-06-22 NOTE — Assessment & Plan Note (Signed)
 Restart lipitor if CK wnl

## 2023-06-22 NOTE — Plan of Care (Addendum)
 Plan of Care Note for accepted transfer  Patient: Anna Jackson    ZOX:[id number]  DOA: 06/22/2023      Facility requesting transfer: [facility] Requesting Provider: Tama Fails, PA-C Reason for transfer: intractable pain, N/V, AKI  Facility course:  62yo who just had right shockwave lithotripsy done on 5/5 by Dr Willye Harvey now presented to [facility] ED with intractable N/V/ abdominal pain, hematuria. Labs show Cr 1.28. Felt worse with PO challenge in ED. CT abd shows multiple stones with a UVJ stone, has right sided moderate hydronephrosis and hydroureter.  EDP called urology, Dr Valeta Gaudier who recommended admit to medicine for pain control. I have requested EDP to call back urology for formal consultation and evaluation of the patient who just had lithotripsy by them on 5/5.     Plan of care: The patient is accepted for admission to Med-surg  unit for OBS, at [facility].   Author: Bertram Brocks, MD  06/22/2023  Check [email or web address] for on-call coverage.

## 2023-06-22 NOTE — H&P (Signed)
 Anna Jackson FTD:[id number] DOB: Jul 30, 1960 DOA: 06/22/2023     PCP: Estill Hemming, DO   Outpatient Specialists:     GI  Dr.  Dominic Friendly, Cordelia Dessert, MD Urology Dr. Willye Harvey  Patient arrived to ER on 06/22/23 at 1004 Referred by Attending Rai, Hurman Maiden, MD   Patient coming from:    home Lives With SO    Chief Complaint:   Chief Complaint  Patient presents with   Flank Pain    HPI: Anna Jackson is a 63 y.o. female with medical history significant of osteoporosis, kidney stones HLD, anemia, HTN, T cell lymphoma, asthma  Presented with   right flank pain n/v Pt had shockwave  lithotripsy 5/5 by Dr. Willye Harvey with Urology of a 3 x 7 mm proximal ureteral stone on the right side. She has been having pain and N/V. Pain not controlled with oxycodone . Has been able  to pass some smaller stones but still in severe pain. Pt presented to [facility] Cr noted up to 1.28 from 0.78. Urology consulted and felt She has no evidence of obstructive uropathy on her CT image Dr. Valeta Gaudier of urology  recommended medicine admit      Has passed 4 stones dark in color  Denies any fever    But occasional chills   Denies significant ETOH intake   Does not smoke   Denies marijuana use       Regarding pertinent Chronic problems:     Hyperlipidemia -  on statins Lipitor (atorvastatin )  Lipid Panel     Component Value Date/Time   CHOL 141 08/27/2022 1536   CHOL 189 08/12/2021 1317   TRIG 83 08/27/2022 1536   HDL 86 08/27/2022 1536   HDL 77 08/12/2021 1317   CHOLHDL 1.6 08/27/2022 1536   VLDL 14.8 12/09/2020 1356   LDLCALC 38 08/27/2022 1536   LDLDIRECT 105.0 11/23/2010 0913   LABVLDL 19 08/12/2021 1317     HTN on hydrochlorothiazide  has not been taking for the past 1 month   last echo  Recent Results (from the past 06237 hours)  ECHOCARDIOGRAM COMPLETE   Collection Time: 07/28/19 11:47 AM  Result Value   Weight 3,312   Height 68   BP 111/67   Narrative       ECHOCARDIOGRAM REPORT     IMPRESSIONS    1. Abnormal septal motion . Left ventricular ejection fraction, by estimation, is 55%. The left ventricle has normal function. The left ventricle has no regional wall motion abnormalities. There is mild left ventricular hypertrophy. Left ventricular  diastolic parameters were normal.  2. Right ventricular systolic function is normal. The right ventricular size is normal. There is normal pulmonary artery systolic pressure.  3. Left atrial size was moderately dilated.  4. The mitral valve is normal in structure. Trivial mitral valve regurgitation. No evidence of mitral stenosis.  5. The aortic valve is normal in structure. Aortic valve regurgitation is trivial. No aortic stenosis is present.  6. The inferior vena cava is normal in size with greater than 50% respiratory variability, suggesting right atrial pressure of 3 mmHg.            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  She has no evidence of obstructive uropathy on her CT image.  White count within normal limits.   Current plan is to give the patient more fluids make sure that she is tolerating p.o.'s and if she is still currently pain-free then we will let her go home with close follow-up for recheck of her creatinine levels. [AH]  1544 Has been get in with IV and p.o. pain medications after fluid resuscitation.  Her pain is currently 7 out of 10.  She is nauseous her pain is difficult to control.  Will consult with urology again to admit for pain control and acute kidney injury as she continues to receive IV fluid resuscitation and pain control.  [AH]  1549 Can discussed with Dr. Valeta Gaudier of urology who recommends medicine admit [AH]  1608 Case discussed with Dr. Thelma Fire he will admit the patient for fluid resuscitation and pain control. [AH]    Clinical Course User Index [AH] Tama Fails, PA-C       Lab Orders         CBC with Differential         Basic metabolic panel         Urinalysis, Routine w reflex microscopic -Urine, Clean Catch         Urinalysis, Microscopic (reflex)      CTabd/pelvis - There are several 2-3 mm sized calculi in the right upper ureter, which are slightly inferior in location when compared to the prior exam. There are also multiple 1-2 mm calculi in the right vesicoureteric junction region. There is moderate right hydronephrosis and hydroureter. No left nephroureterolithiasis or obstructive uropathy.    Following Medications were ordered in ER: Medications  lactated ringers  infusion ( Intravenous New Bag/Given 06/22/23 1636)  ondansetron  (ZOFRAN ) injection 4 mg (4 mg Intravenous Given 06/22/23 1636)  HYDROmorphone  (DILAUDID ) injection 1 mg (1 mg Intravenous Given 06/22/23 1637)  HYDROmorphone  (DILAUDID ) injection 1 mg (1 mg Intravenous Given 06/22/23 1027)  ondansetron  (ZOFRAN ) injection 4 mg (4 mg Intravenous Given 06/22/23 1024)  sodium chloride  0.9 % bolus 1,000 mL (0 mLs Intravenous Stopped 06/22/23 1435)  oxyCODONE  (Oxy IR/ROXICODONE ) immediate release tablet 10 mg (10 mg Oral  Given 06/22/23 1500)    _______________________________________________________ ER Provider Called:     Urology   Dr Valeta Gaudier  They Recommend admit to medicine        ED Triage Vitals  Encounter Vitals Group     BP 06/22/23 1020 133/74     Systolic BP Percentile --      Diastolic BP Percentile --      Pulse Rate 06/22/23 1020 60  Resp 06/22/23 1020 16     Temp 06/22/23 1020 97.8 F (36.6 C)     Temp Source 06/22/23 1507 Oral     SpO2 06/22/23 1020 99 %     Weight 06/22/23 1016 223 lb 15.8 oz (101.6 kg)     Height 06/22/23 1016 5\' 8"  (1.727 m)     Head Circumference --      Peak Flow --      Pain Score 06/22/23 1018 7     Pain Loc --      Pain Education --      Exclude from Growth Chart --   ZOXW(96)@     _________________________________________ Significant initial  Findings: Abnormal Labs Reviewed  CBC WITH DIFFERENTIAL/PLATELET - Abnormal; Notable for the following components:      Result Value   Hemoglobin 11.8 (*)    All other components within normal limits  BASIC METABOLIC PANEL WITH GFR - Abnormal; Notable for the following components:   Creatinine, Ser 1.28 (*)    GFR, Estimated 48 (*)    All other components within normal limits  URINALYSIS, ROUTINE W REFLEX MICROSCOPIC - Abnormal; Notable for the following components:   Hgb urine dipstick LARGE (*)    All other components within normal limits  URINALYSIS, MICROSCOPIC (REFLEX) - Abnormal; Notable for the following components:   Bacteria, UA RARE (*)    All other components within normal limits     Cardiac Panel (last 3 results) Recent Labs    06/22/23 1947  CKTOTAL 110       The recent clinical data is shown below. Vitals:   06/22/23 1130 06/22/23 1345 06/22/23 1507 06/22/23 1830  BP: 126/65 126/71  121/74  Pulse: 64 (!) 57  72  Resp: 17 16  16   Temp:   97.8 F (36.6 C) 98.4 F (36.9 C)  TempSrc:   Oral Oral  SpO2: 98% 95%  97%  Weight:      Height:          WBC     Component Value  Date/Time   WBC 9.2 06/22/2023 1019   LYMPHSABS 1.4 06/22/2023 1019   LYMPHSABS 1.4 11/18/2016 0754   MONOABS 0.9 06/22/2023 1019   MONOABS 0.4 11/18/2016 0754   EOSABS 0.2 06/22/2023 1019   EOSABS 0.1 11/18/2016 0754   BASOSABS 0.0 06/22/2023 1019   BASOSABS 0.0 11/18/2016 0754      UA   no evidence of UTI   hematuria   Urine analysis:    Component Value Date/Time   COLORURINE YELLOW 06/22/2023 1019   APPEARANCEUR CLEAR 06/22/2023 1019   APPEARANCEUR Clear 06/14/2023 0000   LABSPEC 1.025 06/22/2023 1019   LABSPEC 1.030 06/11/2013 1559   PHURINE 5.5 06/22/2023 1019   GLUCOSEU NEGATIVE 06/22/2023 1019   GLUCOSEU Negative 06/11/2013 1559   HGBUR LARGE (A) 06/22/2023 1019   HGBUR large 12/05/2007 1251   BILIRUBINUR NEGATIVE 06/22/2023 1019   BILIRUBINUR 1+ (A) 06/14/2023 0000   BILIRUBINUR Negative 06/11/2013 1559   KETONESUR NEGATIVE 06/22/2023 1019   PROTEINUR NEGATIVE 06/22/2023 1019   UROBILINOGEN 0.2 08/20/2015 0949   UROBILINOGEN 0.2 06/11/2013 1559   NITRITE NEGATIVE 06/22/2023 1019   LEUKOCYTESUR NEGATIVE 06/22/2023 1019   LEUKOCYTESUR Negative 06/11/2013 1559    Results for orders placed or performed in visit on 06/14/23  Microscopic Examination     Status: Abnormal   Collection Time: 06/14/23 12:00 AM   Urine  Result Value Ref Range Status   WBC, UA 0-5  0 - 5 /hpf Final   RBC, Urine 3-10 (A) 0 - 2 /hpf Final   Epithelial Cells (non renal) 0-10 0 - 10 /hpf Final   Crystals Present N/A Final   Crystal Type Calcium  Oxalate N/A Final   Mucus, UA Present (A) Not Estab. Final   Bacteria, UA Few None seen/Few Final    __________________________________________________________ Recent Labs  Lab 06/22/23 1019  NA 140  K 3.6  CO2 27  GLUCOSE 91  BUN 13  CREATININE 1.28*  CALCIUM  9.7    Cr  Up from baseline see below Lab Results  Component Value Date   CREATININE 1.28 (H) 06/22/2023   CREATININE 0.78 06/10/2023   CREATININE 0.72 10/12/2022    Recent  Labs  Lab 06/22/23 1947  AST 20  ALT 16  ALKPHOS 113  BILITOT 1.2  PROT 6.7  ALBUMIN 3.6   Lab Results  Component Value Date   CALCIUM  9.7 06/22/2023       Plt: Lab Results  Component Value Date   PLT 245 06/22/2023         Recent Labs  Lab 06/22/23 1019  WBC 9.2  NEUTROABS 6.7  HGB 11.8*  HCT 36.2  MCV 88.1  PLT 245    HG/HCT  stable,       Component Value Date/Time   HGB 11.8 (L) 06/22/2023 1019   HGB 12.4 11/18/2016 0754   HCT 36.2 06/22/2023 1019   HCT 38.6 11/18/2016 0754   MCV 88.1 06/22/2023 1019   MCV 88.5 11/18/2016 0754    _______________________________________________ Hospitalist was called for admission for   AKI,  Intractable pain, Dehydration    The following Work up has been ordered so far:  Orders Placed This Encounter  Procedures   CT Renal Stone Study   CBC with Differential   Basic metabolic panel   Urinalysis, Routine w reflex microscopic -Urine, Clean Catch   Urinalysis, Microscopic (reflex)   Diet clear liquid Room service appropriate? Yes; Fluid consistency: Thin   Consult to urology   Consult to urology   Consult to hospitalist   Place in observation (patient's expected length of stay will be less than 2 midnights)     OTHER Significant initial  Findings:  labs showing:     DM  labs:  HbA1C: Recent Labs    08/27/22 1536  HGBA1C 5.7*       CBG (last 3)  No results for input(s): "GLUCAP" in the last 72 hours.        Cultures: No results found for: "SDES", "SPECREQUEST", "CULT", "REPTSTATUS"   Radiological Exams on Admission: CT Renal Stone Study Result Date: 06/22/2023 CLINICAL DATA:  Abdominal/flank pain, stone suspected. EXAM: CT ABDOMEN AND PELVIS WITHOUT CONTRAST TECHNIQUE: Multidetector CT imaging of the abdomen and pelvis was performed following the standard protocol without IV contrast. RADIATION DOSE REDUCTION: This exam was performed according to the departmental dose-optimization program which  includes automated exposure control, adjustment of the mA and/or kV according to patient size and/or use of iterative reconstruction technique. COMPARISON:  CT scan renal stone protocol from 06/10/2023. FINDINGS: Lower chest: There are dependent changes in the visualized lung bases. No overt consolidation. No pleural effusion. The heart is normal in size. No pericardial effusion. Hepatobiliary: The liver is normal in size. Non-cirrhotic configuration. No suspicious mass. No intrahepatic or extrahepatic bile duct dilation. Gallbladder is surgically absent. Pancreas: Unremarkable. No pancreatic ductal dilatation or surrounding inflammatory changes. Spleen: Within normal limits. No focal lesion. Adrenals/Urinary Tract:  Adrenal glands are unremarkable. No suspicious renal mass within the limitations of this unenhanced exam. No left nephroureterolithiasis or obstructive uropathy. There is moderate right hydronephrosis and hydroureter secondary to several 2-3 mm sized calculi in the right upper ureter, which are slightly inferior in location when compared to the prior exam. There also multiple 1-2 mm calculi in the right vesicoureteric junction region. No right nephro lithiasis. Redemonstration of several dystrophic calcifications in the right kidney upper pole, posterolaterally. There are tiny right UVJ calculus, as described above. Urinary bladder is otherwise within normal limits. Stomach/Bowel: No disproportionate dilation of the small or large bowel loops. No evidence of abnormal bowel wall thickening or inflammatory changes. The appendix is unremarkable. There are multiple diverticula mainly in the sigmoid colon, without imaging signs of diverticulitis. Vascular/Lymphatic: No ascites or pneumoperitoneum. No abdominal or pelvic lymphadenopathy, by size criteria. No aneurysmal dilation of the major abdominal arteries. Reproductive: The uterus is surgically absent. No large adnexal mass. Other: Patient is status post  hernia repair with mesh and metallic anchors in the epigastric region. The soft tissues and abdominal wall are otherwise unremarkable. Musculoskeletal: No suspicious osseous lesions. There are mild multilevel degenerative changes in the visualized spine. IMPRESSION: 1. There are several 2-3 mm sized calculi in the right upper ureter, which are slightly inferior in location when compared to the prior exam. There are also multiple 1-2 mm calculi in the right vesicoureteric junction region. There is moderate right hydronephrosis and hydroureter. No left nephroureterolithiasis or obstructive uropathy. 2. Multiple other nonacute observations, as described above. Electronically Signed   By: Beula Brunswick M.D.   On: 06/22/2023 11:35   _______________________________________________________________________________________________________ Latest  Blood pressure 121/74, pulse 72, temperature 98.4 F (36.9 C), temperature source Oral, resp. rate 16, height 5\' 8"  (1.727 m), weight 101.6 kg, SpO2 97%.   Vitals  labs and radiology finding personally reviewed  Review of Systems:    Pertinent positives include:  abdominal pain, nausea, vomiting,   Constitutional:  No weight loss, night sweats, Fevers, chills, fatigue, weight loss  HEENT:  No headaches, Difficulty swallowing,Tooth/dental problems,Sore throat,  No sneezing, itching, ear ache, nasal congestion, post nasal drip,  Cardio-vascular:  No chest pain, Orthopnea, PND, anasarca, dizziness, palpitations.no Bilateral lower extremity swelling  GI:  No heartburn, indigestion, diarrhea, change in bowel habits, loss of appetite, melena, blood in stool, hematemesis Resp:  no shortness of breath at rest. No dyspnea on exertion, No excess mucus, no productive cough, No non-productive cough, No coughing up of blood.No change in color of mucus.No wheezing. Skin:  no rash or lesions. No jaundice GU:  no dysuria, change in color of urine, no urgency or frequency.  No straining to urinate.  No flank pain.  Musculoskeletal:  No joint pain or no joint swelling. No decreased range of motion. No back pain.  Psych:  No change in mood or affect. No depression or anxiety. No memory loss.  Neuro: no localizing neurological complaints, no tingling, no weakness, no double vision, no gait abnormality, no slurred speech, no confusion  All systems reviewed and apart from HOPI all are negative _______________________________________________________________________________________________ Past Medical History:   Past Medical History:  Diagnosis Date   Adenopathy    left axillary   Anemia    Anxiety    Anxiety state, unspecified    Asthma    no problem with asthma in several years   Barrett esophagus    Chest pain    Constipation    Depression  Esophageal reflux    GERD (gastroesophageal reflux disease)    Headache 05/16/2013   Herpes simplex without mention of complication    Hyperlipidemia 03/12/2014   Hypertension    Joint pain    Lymphoma, 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   Mental disorder    Neuropathic pain 04/04/2014   Non Hodgkin's lymphoma (HCC) 2015   Skin infection 11/02/2013   Unspecified asthma(493.90)    Unspecified essential hypertension    URI (upper respiratory infection) 01/15/2014   Vitamin D  deficiency      Past Surgical History:  Procedure Laterality Date   AXILLARY LYMPH NODE DISSECTION Left 03/09/2013   Procedure: EXCISION LEFT AXILLARY LYMPH NODES;  Surgeon: Levert Ready, MD;  Location: [facility];  Service: General;  Laterality: Left;   BREAST BIOPSY Left 02/13/2013   high risk   BREAST EXCISIONAL BIOPSY Left 03/09/2013   high risk   CERVICAL CONIZATION W/BX     CHOLECYSTECTOMY  1995   Open, done with Hiatal hernia repair   ESOPHAGEAL MANOMETRY N/A 01/12/2017   Procedure: ESOPHAGEAL MANOMETRY (EM);  Surgeon: Albertina Hugger, MD;  Location: [facility];  Service: Gastroenterology;  Laterality: N/A;   EXTRACORPOREAL  SHOCK WAVE LITHOTRIPSY Right 06/20/2023   Procedure: LITHOTRIPSY, ESWL;  Surgeon: Mellie Sprinkle., MD;  Location: [facility];  Service: Urology;  Laterality: Right;   INCISIONAL HERNIA REPAIR  2004   Dr Salena Craven.  Parietex 20x15cm mesh   KNEE SURGERY Right 02/2010   Dr Mevelyn Acton, Arthroscopic   NISSEN FUNDOPLICATION  1995   OPEN, Dr Dellie Fergusson   ORIF WRIST FRACTURE Right 12/25/2019   Procedure: #1 right wrist/radius open reduction internal fixation comminuted complex distal radius fracture with DVR Biomet cross lock plate and screw construct.  This was a greater than 3 part intra-articular fracture.  #2 AP lateral and oblique x-rays performed examined and interpreted by myself #3 evaluation anesthesia ulnar styloid fracture with subsequent closed treatment based upon stability    PORTACATH PLACEMENT N/A 03/23/2013   Procedure: INSERTION PORT-A-CATH;  Surgeon: Levert Ready, MD;  Location: [facility] OR;  Service: General;  Laterality: N/A;   TUBAL LIGATION     VAGINAL HYSTERECTOMY  2002   Dr Art Harlon Light    Social History:  Ambulatory   independently       reports that she has never smoked. She has never used smokeless tobacco. She reports current alcohol use. She reports that she does not use drugs.   Family History:  Family History  Problem Relation Age of Onset   Heart disease Mother    High blood pressure Mother    High Cholesterol Mother    Anxiety disorder Mother    Heart disease Father    High Cholesterol Father    High blood pressure Father    Heart disease Brother    Alcoholism Brother    Breast cancer Maternal Grandmother 72   Cancer Maternal Grandmother 103       breast   Diabetes Cousin    Coronary artery disease Other        with hernia repair   Migraines Other    Hypertension Other    Colon cancer Neg Hx    ______________________________________________________________________________________________ Allergies: Allergies  Allergen Reactions   Betadine  [Povidone Iodine] Hives and Itching   Codeine Nausea And Vomiting     Prior to Admission medications   Medication Sig Start Date End Date Taking? Authorizing Provider  alendronate  (FOSAMAX ) 70 MG tablet Take 1 tablet (  70 mg total) by mouth once a week. Take with a full glass of water  on an empty stomach. 06/14/23   Crecencio Dodge, Candida Chalk, DO  ALPRAZolam  (XANAX ) 0.25 MG tablet Take 1 tablet (0.25 mg total) by mouth 3 (three) times daily as needed for anxiety. 08/27/22   Estill Hemming, DO  atorvastatin  (LIPITOR) 20 MG tablet Take 1 tablet (20 mg total) by mouth daily. Pt needs office visit for further refills 05/23/23   Crecencio Dodge, Adel Holt R, DO  cyclobenzaprine  (FLEXERIL ) 5 MG tablet TAKE 1 TABLET BY MOUTH AT BEDTIME 02/21/23   Adah Acron, MD  docusate sodium  (COLACE) 100 MG capsule Take 300 mg by mouth in the morning.     [provider]  FLUoxetine  (PROZAC ) 40 MG capsule Take 1 capsule by mouth once daily 02/21/23   Crecencio Dodge, Yvonne R, DO  hydrochlorothiazide  (HYDRODIURIL ) 25 MG tablet Take 1 tablet by mouth once daily 02/21/23   Crecencio Dodge, Yvonne R, DO  ondansetron  (ZOFRAN -ODT) 4 MG disintegrating tablet Take 1 tablet (4 mg total) by mouth every 8 (eight) hours as needed. 06/10/23   Smoot, Genevive Ket, PA-C  oxyCODONE -acetaminophen  (PERCOCET/ROXICET) 5-325 MG tablet Take 1 tablet by mouth every 6 (six) hours as needed for severe pain (pain score 7-10). 06/21/23   Stoneking, Ponce Brisker., MD  potassium chloride  SA (KLOR-CON  M) 20 MEQ tablet Take 1 tablet (20 mEq total) by mouth 2 (two) times daily. 10/13/22   Estill Hemming, DO  tamsulosin  (FLOMAX ) 0.4 MG CAPS capsule Take 1 capsule (0.4 mg total) by mouth daily. 06/10/23   Smoot, Genevive Ket, PA-C  Vitamin D , Ergocalciferol , (DRISDOL ) 1.25 MG (50000 UNIT) CAPS capsule Take 1 capsule by mouth once a week 04/15/23   Estill Hemming, DO     ___________________________________________________________________________________________________ Physical Exam:    06/22/2023    6:30 PM 06/22/2023    1:45 PM 06/22/2023   11:30 AM  Vitals with BMI  Systolic 121 126 914  Diastolic 74 71 65  Pulse 72 57 64     1. General:  in No  Acute distress   Well  -appearing 2. Psychological: Alert and   Oriented 3. Head/ENT:    Dry Mucous Membranes                          Head Non traumatic, neck supple                           Poor Dentition 4. SKIN:  decreased Skin turgor,  Skin clean Dry and intact no rash    5. Heart: Regular rate and rhythm no  Murmur, no Rub or gallop 6. Lungs:  no wheezes or crackles   7. Abdomen: Soft,  non-tender, Non distended   obese  bowel sounds present 8. Lower extremities: no clubbing, cyanosis, no  edema 9. Neurologically Grossly intact, moving all 4 extremities equally intact 10. MSK: Normal range of motion    Chart has been reviewed  ______________________________________________________________________________________________  Assessment/Plan  63 y.o. female with medical history significant of osteoporosis, kidney stones HLD, anemia, HTN, T cell lymphoma, asthma  Admitted for    AKI, Intractable pain,  Dehydration    Present on Admission:  AKI (acute kidney injury) (HCC)  Asthma  Hyperlipidemia  Primary hypertension  Hypokalemia  Dehydration     AKI (acute kidney injury) (HCC) Due to  dehydration rehydrate  and order urine lytes  Asthma Chronic stable  Hyperlipidemia Restart lipitor if CK wnl  Primary hypertension Hold hydrochlorothiazide  given dehydration  History of lymphoma Now in remission  Hypokalemia - will replace electrolytes and repeat  check Mg, phos and Ca level and replace as needed Monitor on telemetry   Lab Results  Component Value Date   K 3.0 (L) 06/22/2023     Lab Results  Component Value Date   CREATININE 1.15 (H) 06/22/2023   Lab Results   Component Value Date   MG 1.9 06/22/2023   Lab Results  Component Value Date   CALCIUM  8.9 06/22/2023   PHOS 3.6 06/22/2023     Kidney stone Urology felt that CT imaging shows no evidence of significant obstruction. Recommend admission for pain management and rehydration Patient currently doing better with pain medication. Will strain urine Continue Flomax  Follow renal function  Dehydration Will rehydrate with IV fluids encourage p.o. intake May benefit from orthostatics prior to discharge    Other plan as per orders.  DVT prophylaxis:  SCD     Code Status:    Code Status: Prior FULL CODE  as per patient   I had personally discussed CODE STATUS with patient   ACP  none    Family Communication:   Family not at  Bedside    Diet  Diet Orders (From admission, onward)     Start     Ordered   06/22/23 2036  Diet Heart Room service appropriate? Yes; Fluid consistency: Thin  Diet effective now       Question Answer Comment  Room service appropriate? Yes   Fluid consistency: Thin      06/22/23 2035            Disposition Plan:     To home once workup is complete and patient is stable   Following barriers for discharge:                                                       Electrolytes corrected                                                            Pain controlled with PO medications                                                            Will need to be able to tolerate PO                                 Consult Orders  (From admission, onward)           Start     Ordered   06/22/23 1551  Consult to hospitalist  Called [facility] for consult talked to Tequila at 3:53  Once       Provider:  (Not yet assigned)  Question Answer  Comment  Place call to: [facility]   Reason for Consult Admit      06/22/23 1550                         Consults called:  Urology is aware   Admission status:  ED Disposition     ED Disposition   Admit   Condition  --   Comment  Hospital Area: [facility] [[id number]]  Level of Care: Med-Surg [16]  Interfacility transfer: Yes  May place patient in observation at [facility] or [facility] if equivalent level of care is available:: Yes  Covid Evaluation: Asymptomatic - no recent exposure (last 10 days) testing not required  Diagnosis: AKI (acute kidney injury) [facility]) [[id number]]  Admitting Physician: RAI, RIPUDEEP K [[id number]]  Attending Physician: Bertram Brocks K [[id number]]           Obs      Level of care     medical floor         Teila Skalsky 06/22/2023, 9:27 PM    [facility]     after 2 AM please page floor coverage   If 7AM-7PM, please contact the day team taking care of the patient using [email or web address]

## 2023-06-22 NOTE — Telephone Encounter (Signed)
 Spoke with pt in reference to pain and refill sent to pharmacy. Pt stated that she stays at a constant 7 or 8 on pain scale. Pt stated that oxy does not even take the edge off. Pt also stated that last night she did pass 4 little fragments, she is/has been urinating well. Advised pt to go seek tx at ER due to clinic not being able to perform imaging if needed or manage pain. Pt voiced understanding.

## 2023-06-22 NOTE — ED Triage Notes (Signed)
 States had procedure to zap kidney stones  on Monday and now pain has gotten worse  states has  passed some of the smaller ones but her pain is still bAD AND PERCOCET  not working

## 2023-06-22 NOTE — Assessment & Plan Note (Signed)
 Urology felt that CT imaging shows no evidence of significant obstruction. Recommend admission for pain management and rehydration Patient currently doing better with pain medication. Will strain urine Continue Flomax  Follow renal function

## 2023-06-22 NOTE — Assessment & Plan Note (Signed)
-   will replace electrolytes and repeat  check Mg, phos and Ca level and replace as needed Monitor on telemetry   Lab Results  Component Value Date   K 3.0 (L) 06/22/2023     Lab Results  Component Value Date   CREATININE 1.15 (H) 06/22/2023   Lab Results  Component Value Date   MG 1.9 06/22/2023   Lab Results  Component Value Date   CALCIUM  8.9 06/22/2023   PHOS 3.6 06/22/2023

## 2023-06-22 NOTE — Telephone Encounter (Signed)
 Pt called to find out if a heating pad will help with the pain after lithotripsy I advised her that the instructions she was sent home with recommend that. Plus sent a msg to [place] about her pain management after surgery.

## 2023-06-22 NOTE — Assessment & Plan Note (Signed)
 Chronic-stable.

## 2023-06-22 NOTE — Assessment & Plan Note (Signed)
Now in remission. 

## 2023-06-23 ENCOUNTER — Encounter (HOSPITAL_COMMUNITY): Payer: Self-pay | Admitting: Internal Medicine

## 2023-06-23 DIAGNOSIS — Z8249 Family history of ischemic heart disease and other diseases of the circulatory system: Secondary | ICD-10-CM | POA: Diagnosis not present

## 2023-06-23 DIAGNOSIS — R52 Pain, unspecified: Secondary | ICD-10-CM

## 2023-06-23 DIAGNOSIS — E876 Hypokalemia: Secondary | ICD-10-CM | POA: Diagnosis not present

## 2023-06-23 DIAGNOSIS — R112 Nausea with vomiting, unspecified: Secondary | ICD-10-CM | POA: Diagnosis not present

## 2023-06-23 DIAGNOSIS — I10 Essential (primary) hypertension: Secondary | ICD-10-CM | POA: Diagnosis present

## 2023-06-23 DIAGNOSIS — C859A Non-Hodgkin lymphoma, unspecified, in remission: Secondary | ICD-10-CM | POA: Diagnosis present

## 2023-06-23 DIAGNOSIS — N1330 Unspecified hydronephrosis: Secondary | ICD-10-CM

## 2023-06-23 DIAGNOSIS — Z833 Family history of diabetes mellitus: Secondary | ICD-10-CM | POA: Diagnosis not present

## 2023-06-23 DIAGNOSIS — M810 Age-related osteoporosis without current pathological fracture: Secondary | ICD-10-CM | POA: Diagnosis present

## 2023-06-23 DIAGNOSIS — K219 Gastro-esophageal reflux disease without esophagitis: Secondary | ICD-10-CM | POA: Diagnosis present

## 2023-06-23 DIAGNOSIS — N179 Acute kidney failure, unspecified: Secondary | ICD-10-CM | POA: Diagnosis present

## 2023-06-23 DIAGNOSIS — E785 Hyperlipidemia, unspecified: Secondary | ICD-10-CM | POA: Diagnosis present

## 2023-06-23 DIAGNOSIS — Z885 Allergy status to narcotic agent status: Secondary | ICD-10-CM | POA: Diagnosis not present

## 2023-06-23 DIAGNOSIS — Z803 Family history of malignant neoplasm of breast: Secondary | ICD-10-CM | POA: Diagnosis not present

## 2023-06-23 DIAGNOSIS — Z6834 Body mass index (BMI) 34.0-34.9, adult: Secondary | ICD-10-CM | POA: Diagnosis not present

## 2023-06-23 DIAGNOSIS — Z83438 Family history of other disorder of lipoprotein metabolism and other lipidemia: Secondary | ICD-10-CM | POA: Diagnosis not present

## 2023-06-23 DIAGNOSIS — F32A Depression, unspecified: Secondary | ICD-10-CM | POA: Diagnosis present

## 2023-06-23 DIAGNOSIS — R109 Unspecified abdominal pain: Secondary | ICD-10-CM | POA: Diagnosis present

## 2023-06-23 DIAGNOSIS — E860 Dehydration: Secondary | ICD-10-CM | POA: Diagnosis present

## 2023-06-23 DIAGNOSIS — J45909 Unspecified asthma, uncomplicated: Secondary | ICD-10-CM | POA: Diagnosis present

## 2023-06-23 DIAGNOSIS — N132 Hydronephrosis with renal and ureteral calculous obstruction: Secondary | ICD-10-CM | POA: Diagnosis present

## 2023-06-23 DIAGNOSIS — Z883 Allergy status to other anti-infective agents status: Secondary | ICD-10-CM | POA: Diagnosis not present

## 2023-06-23 DIAGNOSIS — Z79899 Other long term (current) drug therapy: Secondary | ICD-10-CM | POA: Diagnosis not present

## 2023-06-23 DIAGNOSIS — Z7983 Long term (current) use of bisphosphonates: Secondary | ICD-10-CM | POA: Diagnosis not present

## 2023-06-23 DIAGNOSIS — F411 Generalized anxiety disorder: Secondary | ICD-10-CM | POA: Diagnosis present

## 2023-06-23 DIAGNOSIS — G8918 Other acute postprocedural pain: Secondary | ICD-10-CM | POA: Diagnosis present

## 2023-06-23 DIAGNOSIS — Z818 Family history of other mental and behavioral disorders: Secondary | ICD-10-CM | POA: Diagnosis not present

## 2023-06-23 DIAGNOSIS — E669 Obesity, unspecified: Secondary | ICD-10-CM | POA: Diagnosis present

## 2023-06-23 HISTORY — DX: Nausea with vomiting, unspecified: R11.2

## 2023-06-23 LAB — COMPREHENSIVE METABOLIC PANEL WITH GFR
ALT: 15 U/L (ref 0–44)
AST: 16 U/L (ref 15–41)
Albumin: 3.3 g/dL — ABNORMAL LOW (ref 3.5–5.0)
Alkaline Phosphatase: 105 U/L (ref 38–126)
Anion gap: 9 (ref 5–15)
BUN: 9 mg/dL (ref 6–20)
CO2: 26 mmol/L (ref 22–32)
Calcium: 8.8 mg/dL — ABNORMAL LOW (ref 8.9–10.3)
Chloride: 102 mmol/L (ref 98–111)
Creatinine, Ser: 1.28 mg/dL — ABNORMAL HIGH (ref 0.44–1.00)
GFR, Estimated: 48 mL/min — ABNORMAL LOW (ref >60)
Glucose, Bld: 103 mg/dL — ABNORMAL HIGH (ref 70–99)
Potassium: 3.5 mmol/L (ref 3.5–5.1)
Sodium: 137 mmol/L (ref 135–145)
Total Bilirubin: 1 mg/dL (ref 0.0–1.2)
Total Protein: 6.1 g/dL — ABNORMAL LOW (ref 6.5–8.1)

## 2023-06-23 LAB — MAGNESIUM: Magnesium: 2 mg/dL (ref 1.7–2.4)

## 2023-06-23 LAB — CBC
HCT: 34.8 % — ABNORMAL LOW (ref 36.0–46.0)
Hemoglobin: 11 g/dL — ABNORMAL LOW (ref 12.0–15.0)
MCH: 29.1 pg (ref 26.0–34.0)
MCHC: 31.6 g/dL (ref 30.0–36.0)
MCV: 92.1 fL (ref 80.0–100.0)
Platelets: 225 10*3/uL (ref 150–400)
RBC: 3.78 MIL/uL — ABNORMAL LOW (ref 3.87–5.11)
RDW: 13.4 % (ref 11.5–15.5)
WBC: 9.9 10*3/uL (ref 4.0–10.5)
nRBC: 0 % (ref 0.0–0.2)

## 2023-06-23 LAB — SODIUM, URINE, RANDOM: Sodium, Ur: 88 mmol/L

## 2023-06-23 LAB — CREATININE, URINE, RANDOM: Creatinine, Urine: 36 mg/dL

## 2023-06-23 LAB — OSMOLALITY, URINE: Osmolality, Ur: 272 mosm/kg — ABNORMAL LOW (ref 300–900)

## 2023-06-23 LAB — PHOSPHORUS: Phosphorus: 3.5 mg/dL (ref 2.5–4.6)

## 2023-06-23 MED ORDER — SODIUM CHLORIDE 0.9 % IV BOLUS
1000.0000 mL | Freq: Once | INTRAVENOUS | Status: AC
  Administered 2023-06-23: 1000 mL via INTRAVENOUS

## 2023-06-23 MED ORDER — ATORVASTATIN CALCIUM 10 MG PO TABS
20.0000 mg | ORAL_TABLET | Freq: Every day | ORAL | Status: DC
Start: 2023-06-23 — End: 2023-06-24
  Administered 2023-06-23 – 2023-06-24 (×2): 20 mg via ORAL
  Filled 2023-06-23 (×3): qty 2

## 2023-06-23 MED ORDER — LACTATED RINGERS IV SOLN: INTRAVENOUS | Status: DC

## 2023-06-23 NOTE — TOC Initial Note (Signed)
 Transition of Care [facility]) - Initial/Assessment Note    Patient Details  Name: Anna Jackson MRN: [id number] Date of Birth: [birth year]  Transition of Care [facility]) CM/SW Contact:    Kathryn Parish, RN Phone Number: 06/23/2023, 11:47 AM  Clinical Narrative:                 Spoke to pt in room; pt says she lives in a mobile home with spouse;  she verified insurance/PCP; he denies SDOH risks; pt says she does have DME (cane, crutches, [place]), HH services, or home oxygen; no TOC needs; TOC signing off; please place consult if needed  Expected Discharge Plan: Home/Self Care Barriers to Discharge: No Barriers Identified   Patient Goals and CMS Choice Patient states their goals for this hospitalization and ongoing recovery are:: Home   Choice offered to / list presented to : NA      Expected Discharge Plan and Services In-house Referral: NA Discharge Planning Services: CM Consult Post Acute Care Choice: NA Living arrangements for the past 2 months: Mobile Home                 DME Arranged: N/A DME Agency: NA       HH Arranged: NA HH Agency: NA        Prior Living Arrangements/Services Living arrangements for the past 2 months: Mobile Home Lives with:: Spouse Patient language and need for interpreter reviewed:: Yes Do you feel safe going back to the place where you live?: Yes      Need for Family Participation in Patient Care: No (Comment) Care giver support system in place?: Yes (comment) Current home services: DME (cane, cruthers, [place]) Criminal Activity/Legal Involvement Pertinent to Current Situation/Hospitalization: No - Comment as needed  Activities of Daily Living   ADL Screening (condition at time of admission) Independently performs ADLs?: Yes (appropriate for developmental age) Is the patient deaf or have difficulty hearing?: No Does the patient have difficulty seeing, even when wearing glasses/contacts?: No Does the patient have difficulty concentrating,  remembering, or making decisions?: No  Permission Sought/Granted Permission sought to share information with : Case Manager Permission granted to share information with : Yes, Verbal Permission Granted              Emotional Assessment Appearance:: Appears stated age Attitude/Demeanor/Rapport: Engaged Affect (typically observed): Appropriate Orientation: : Oriented to Self, Oriented to Place, Oriented to  Time, Oriented to Situation Alcohol / Substance Use: Not Applicable Psych Involvement: No (comment)  Admission diagnosis:  Dehydration [E86.0] AKI (acute kidney injury) (HCC) [N17.9] Intractable pain [R52] Patient Active Problem List   Diagnosis Date Noted   AKI (acute kidney injury) (HCC) 06/22/2023   Dehydration 06/22/2023   Ureteral calculus; right 06/14/2023   Kidney stone 06/14/2023   Osteopenia 06/14/2023   Primary osteoarthritis of both knees 06/14/2023   Unilateral primary osteoarthritis, left knee 12/07/2022   Spondylolisthesis, lumbar region 12/07/2022   DOE (dyspnea on exertion) 10/12/2022   Bronchitis 02/12/2022   Vitamin D  deficiency 12/01/2021   Depression 12/01/2021   Class 2 severe obesity with serious comorbidity and body mass index (BMI) of 35.0 to 35.9 in adult [facility]) 12/01/2021   Chest pain 07/02/2021   Other fatigue 02/25/2021   Lymphoma, 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   Major depressive disorder with single episode, in full remission (HCC) 12/09/2020   Closed fracture of distal end of right radius 12/26/2019   Right wrist fracture 12/25/2019   Angina at rest [facility]) 08/02/2019   Hypokalemia  07/28/2019   Chest pain, rule out acute myocardial infarction 07/27/2019   Recurrent hiatal hernia s/p robotic PEH repair/Nissen 03/16/2017 03/16/2017   Hiatal hernia    Acute bronchitis 06/14/2016   B12 deficiency 10/10/2014   Neuropathic pain 04/04/2014   Hyperlipidemia 03/12/2014   Obesity (BMI 30-39.9) 01/08/2014   Neuropathy due to chemotherapeutic drug (HCC)  07/18/2013   Headache 05/16/2013   History of lymphoma 03/20/2013   Obesity, morbid (HCC)    Barrett's esophagus 08/10/2010   Heme positive stool 06/30/2010   Depression, major, single episode, in partial remission (HCC) 03/04/2009   GERD 12/08/2007   POSTMENOPAUSAL STATUS 11/21/2007   Anxiety state 05/09/2007   Pain in joint, lower leg 10/20/2006   HERPES SIMPLEX, UNCOMPLICATED 05/27/2006   ONYCHOMYCOSIS, TOENAILS 05/27/2006   Primary hypertension 04/26/2006   Asthma 04/26/2006   HEADACHE 04/26/2006   UMBILICAL HERNIORRHAPHY, HX OF 04/26/2006   PCP:  Estill Hemming, DO Pharmacy:   [facility] [address], [place] - [address] [facility] [place] [address] Phone: [phone] Fax: [phone]     Social Drivers of Health (SDOH) Social History: SDOH Screenings   Food Insecurity: Patient Declined (06/23/2023)  Recent Concern: Food Insecurity - Food Insecurity Present (06/22/2023)  Housing: Low Risk  (06/22/2023)  Transportation Needs: No Transportation Needs (06/22/2023)  Utilities: Not At Risk (06/22/2023)  Depression (PHQ2-9): Low Risk  (08/27/2022)  Financial Resource Strain: Low Risk  (06/14/2023)  Physical Activity: Unknown (06/14/2023)  Social Connections: Moderately Integrated (06/14/2023)  Stress: No Stress Concern Present (06/14/2023)  Tobacco Use: Low Risk  (06/22/2023)   SDOH Interventions: Food Insecurity Interventions: Intervention Not Indicated, Inpatient TOC, Other (Comment) (Answer incorrectly to question. No needs)   Readmission Risk Interventions     No data to display

## 2023-06-23 NOTE — Plan of Care (Signed)

## 2023-06-23 NOTE — Progress Notes (Signed)
 Acknowledging spiritual care consult for assistance with HCPOA, but was unable to complete this consult today.

## 2023-06-23 NOTE — Progress Notes (Addendum)
 PROGRESS NOTE                                                                                                                                                                                                             Patient Demographics:    Anna Jackson, is a 63 y.o. female, DOB - 03/08/1960, RUE:[id number]  Outpatient Primary MD for the patient is Crecencio Dodge, Candida Chalk, DO    LOS - 0  Admit date - 06/22/2023    Chief Complaint  Patient presents with   Flank Pain       Brief Narrative (HPI from H&P)    Anna Jackson is a 63 y.o. female with medical history significant of osteoporosis, kidney stones HLD, anemia, HTN, T cell lymphoma, asthma who  presented with right flank pain and n/v. Pt had shockwave  lithotripsy 5/5 by Dr. Willye Harvey with Urology of a 3 x 7 mm proximal ureteral stone on the right side. She has been having pain and N/V. Pain not controlled with oxycodone . Has been able  to pass some smaller stones but still in severe pain. Pt presented to [facility] noted up to 1.28 from 0.78.  Urology consulted and felt She has no evidence of obstructive uropathy on her CT image Dr. Valeta Gaudier of urology  recommended medicine admit    Has passed 4 stones dark in color. Denies any fever. But occasional chills   Denies significant ETOH intake   Does not smoke  Denies marijuana use     Subjective:    Anna Jackson was evaluated at the bed side laying comfortably in bed.  Reports feeling much better today.  She was able to pass a few more stones.  Continue to have mild right flank pain but this is also improving.  Became nauseous after attempting to eat breakfast.  Will attempt lunch to see if she is able to keep it down.   Assessment  & Plan :    Assessment and Plan: AKI (acute kidney injury) (HCC) -Due to dehydration in the setting of nausea and vomiting -Creatinine still at 1.28 today, patient still dry on  exam -Give IV NS 1 L bolus and continue IV LR at 25 cc/h for another day -Trend renal function   Nephroliathiasis Nausea and vomiting Right flank pain -CT renal stone shows a few small  stones in the right ureter with moderate right hydronephrosis and hydroureter. -Patient continues to pass the stones with improvement in her right flank pain -Continue pain control with as needed Percocet and Dilaudid  -Continue as needed Reglan  and Zofran  for nausea and vomiting -Continue tamsulosin  at bedtime -Urology consulted by EDP, has yet to see the patient but based on imaging, did not think patient needs acute intervention.  Asthma -Chronic stable   Hyperlipidemia -Continue atorvastatin    Primary hypertension -Continue to hold hydrochlorothiazide  given dehydration   History of lymphoma -Now in remission   Hypokalemia, resolved -K+ of 3.5.  Normal mag and Phos  Anxiety and depression -Continue Prozac  and as needed Xanax   Obesity: Estimated body mass index is 34.06 kg/m as calculated from the following:   Height as of this encounter: 5\' 8"  (1.727 m).   Weight as of this encounter: 101.6 kg.  -Follow-up with PCP in the outpatient for nutrition and weight loss counseling       Condition -stable  Family Communication  : No family at bedside  Code Status : Full  Consults  : Urology  PUD Prophylaxis : None   Procedures  :     None      Disposition Plan  :    Status is: Observation The patient remains OBS appropriate and will d/c before 2 midnights.  DVT Prophylaxis  :    SCDs Start: 06/22/23 1936     Lab Results  Component Value Date   PLT 225 06/23/2023    Diet :  Diet Order             Diet Heart Room service appropriate? Yes; Fluid consistency: Thin  Diet effective now                    Inpatient Medications  Scheduled Meds:  FLUoxetine   40 mg Oral Daily   tamsulosin   0.4 mg Oral QHS   Continuous Infusions:  lactated ringers  125 mL/hr  at 06/23/23 0117   PRN Meds:.acetaminophen  **OR** acetaminophen , ALPRAZolam , HYDROmorphone  (DILAUDID ) injection, metoCLOPramide  (REGLAN ) injection, ondansetron  (ZOFRAN ) IV, oxyCODONE -acetaminophen   Antibiotics  :    Anti-infectives (From admission, onward)    None         Objective:   Vitals:   06/22/23 1830 06/22/23 2243 06/23/23 0210 06/23/23 0637  BP: 121/74 (!) 113/56 138/77 132/73  Pulse: 72 75 74 82  Resp: 16 18 18 19   Temp: 98.4 F (36.9 C) 98.5 F (36.9 C) 97.6 F (36.4 C) 98.3 F (36.8 C)  TempSrc: Oral Oral Oral Oral  SpO2: 97% 90% 94% 94%  Weight:      Height:        Wt Readings from Last 3 Encounters:  06/22/23 101.6 kg  06/20/23 101.6 kg  06/14/23 102.4 kg     Intake/Output Summary (Last 24 hours) at 06/23/2023 0901 Last data filed at 06/23/2023 0600 Gross per 24 hour  Intake 1000.68 ml  Output 800 ml  Net 200.68 ml     Physical Exam  General: Pleasant, well-appearing woman laying in bed. No acute distress. HEENT: [place]/AT. Anicteric sclera.  Dry mucous membrane. CV: RRR. No murmurs, rubs, or gallops. No LE edema Pulmonary: Lungs CTAB. Normal effort. No wheezing or rales. Abdominal: Soft, NT/ND. Normal bowel sounds. Mild ttp of right flank Extremities: Palpable radial and DP pulses. Normal ROM. Skin: Warm and dry. No obvious rash or lesions. Neuro: A&Ox3. Moves all extremities. Normal sensation to light touch. No focal deficit. Psych:  Normal mood and affect    RN pressure injury documentation:      Data Review:    Recent Labs  Lab 06/22/23 1019 06/23/23 0452  WBC 9.2 9.9  HGB 11.8* 11.0*  HCT 36.2 34.8*  PLT 245 225  MCV 88.1 92.1  MCH 28.7 29.1  MCHC 32.6 31.6  RDW 13.3 13.4  LYMPHSABS 1.4  --   MONOABS 0.9  --   EOSABS 0.2  --   BASOSABS 0.0  --     Recent Labs  Lab 06/22/23 1019 06/22/23 1947 06/23/23 0452  NA 140 138 137  K 3.6 3.0* 3.5  CL 102 102 102  CO2 27 27 26   ANIONGAP 12 9 9   GLUCOSE 91 133* 103*  BUN 13  12 9   CREATININE 1.28* 1.15* 1.28*  AST  --  20 16  ALT  --  16 15  ALKPHOS  --  113 105  BILITOT  --  1.2 1.0  ALBUMIN  --  3.6 3.3*  MG  --  1.9 2.0  CALCIUM  9.7 8.9 8.8*      Recent Labs  Lab 06/22/23 1019 06/22/23 1947 06/23/23 0452  MG  --  1.9 2.0  CALCIUM  9.7 8.9 8.8*    --------------------------------------------------------------------------------------------------------------- Lab Results  Component Value Date   CHOL 141 08/27/2022   HDL 86 08/27/2022   LDLCALC 38 08/27/2022   LDLDIRECT 105.0 11/23/2010   TRIG 83 08/27/2022   CHOLHDL 1.6 08/27/2022    Lab Results  Component Value Date   HGBA1C 5.7 (H) 08/27/2022   No results for input(s): "TSH", "T4TOTAL", "FREET4", "T3FREE", "THYROIDAB" in the last 72 hours. No results for input(s): "VITAMINB12", "FOLATE", "FERRITIN", "TIBC", "IRON", "RETICCTPCT" in the last 72 hours. ------------------------------------------------------------------------------------------------------------------ Cardiac Enzymes No results for input(s): "CKMB", "TROPONINI", "MYOGLOBIN" in the last 168 hours.  Invalid input(s): "CK"  Micro Results Recent Results (from the past 240 hours)  Microscopic Examination     Status: Abnormal   Collection Time: 06/14/23 12:00 AM   Urine  Result Value Ref Range Status   WBC, UA 0-5 0 - 5 /hpf Final   RBC, Urine 3-10 (A) 0 - 2 /hpf Final   Epithelial Cells (non renal) 0-10 0 - 10 /hpf Final   Crystals Present N/A Final   Crystal Type Calcium  Oxalate N/A Final   Mucus, UA Present (A) Not Estab. Final   Bacteria, UA Few None seen/Few Final    Radiology Reports CT Renal Stone Study Result Date: 06/22/2023 CLINICAL DATA:  Abdominal/flank pain, stone suspected. EXAM: CT ABDOMEN AND PELVIS WITHOUT CONTRAST TECHNIQUE: Multidetector CT imaging of the abdomen and pelvis was performed following the standard protocol without IV contrast. RADIATION DOSE REDUCTION: This exam was performed according to  the departmental dose-optimization program which includes automated exposure control, adjustment of the mA and/or kV according to patient size and/or use of iterative reconstruction technique. COMPARISON:  CT scan renal stone protocol from 06/10/2023. FINDINGS: Lower chest: There are dependent changes in the visualized lung bases. No overt consolidation. No pleural effusion. The heart is normal in size. No pericardial effusion. Hepatobiliary: The liver is normal in size. Non-cirrhotic configuration. No suspicious mass. No intrahepatic or extrahepatic bile duct dilation. Gallbladder is surgically absent. Pancreas: Unremarkable. No pancreatic ductal dilatation or surrounding inflammatory changes. Spleen: Within normal limits. No focal lesion. Adrenals/Urinary Tract: Adrenal glands are unremarkable. No suspicious renal mass within the limitations of this unenhanced exam. No left nephroureterolithiasis or obstructive uropathy. There is moderate right hydronephrosis  and hydroureter secondary to several 2-3 mm sized calculi in the right upper ureter, which are slightly inferior in location when compared to the prior exam. There also multiple 1-2 mm calculi in the right vesicoureteric junction region. No right nephro lithiasis. Redemonstration of several dystrophic calcifications in the right kidney upper pole, posterolaterally. There are tiny right UVJ calculus, as described above. Urinary bladder is otherwise within normal limits. Stomach/Bowel: No disproportionate dilation of the small or large bowel loops. No evidence of abnormal bowel wall thickening or inflammatory changes. The appendix is unremarkable. There are multiple diverticula mainly in the sigmoid colon, without imaging signs of diverticulitis. Vascular/Lymphatic: No ascites or pneumoperitoneum. No abdominal or pelvic lymphadenopathy, by size criteria. No aneurysmal dilation of the major abdominal arteries. Reproductive: The uterus is surgically absent. No  large adnexal mass. Other: Patient is status post hernia repair with mesh and metallic anchors in the epigastric region. The soft tissues and abdominal wall are otherwise unremarkable. Musculoskeletal: No suspicious osseous lesions. There are mild multilevel degenerative changes in the visualized spine. IMPRESSION: 1. There are several 2-3 mm sized calculi in the right upper ureter, which are slightly inferior in location when compared to the prior exam. There are also multiple 1-2 mm calculi in the right vesicoureteric junction region. There is moderate right hydronephrosis and hydroureter. No left nephroureterolithiasis or obstructive uropathy. 2. Multiple other nonacute observations, as described above. Electronically Signed   By: Beula Brunswick M.D.   On: 06/22/2023 11:35   DG Abd 1 View Result Date: 06/20/2023 CLINICAL DATA:  Right nephrolithiasis. EXAM: ABDOMEN - 1 VIEW COMPARISON:  Radiograph 06/14/2023.  CT 06/10/2023 FINDINGS: 6 mm calcification to the right of L3 likely corresponds to ureteral stone. The additional right renal calcifications on prior CT are not well seen by radiograph. Surgical clips in the upper abdomen. Tacks from prior abdominal hernia repair. Normal bowel gas pattern. IMPRESSION: A 6 mm calcification to the right of L3 likely corresponds to ureteral stone. Electronically Signed   By: Chadwick Colonel M.D.   On: 06/20/2023 15:45      Signature  -   Vita Grip M.D on 06/23/2023 at 9:01 AM   -  To page go to [email or web address]

## 2023-06-24 DIAGNOSIS — N179 Acute kidney failure, unspecified: Secondary | ICD-10-CM | POA: Diagnosis not present

## 2023-06-24 LAB — BASIC METABOLIC PANEL WITH GFR
Anion gap: 10 (ref 5–15)
BUN: 8 mg/dL (ref 6–20)
CO2: 26 mmol/L (ref 22–32)
Calcium: 8.6 mg/dL — ABNORMAL LOW (ref 8.9–10.3)
Chloride: 102 mmol/L (ref 98–111)
Creatinine, Ser: 0.62 mg/dL (ref 0.44–1.00)
GFR, Estimated: >60 mL/min (ref >60)
Glucose, Bld: 96 mg/dL (ref 70–99)
Potassium: 3.1 mmol/L — ABNORMAL LOW (ref 3.5–5.1)
Sodium: 138 mmol/L (ref 135–145)

## 2023-06-24 LAB — CBC
HCT: 32 % — ABNORMAL LOW (ref 36.0–46.0)
Hemoglobin: 10.4 g/dL — ABNORMAL LOW (ref 12.0–15.0)
MCH: 29.2 pg (ref 26.0–34.0)
MCHC: 32.5 g/dL (ref 30.0–36.0)
MCV: 89.9 fL (ref 80.0–100.0)
Platelets: 214 10*3/uL (ref 150–400)
RBC: 3.56 MIL/uL — ABNORMAL LOW (ref 3.87–5.11)
RDW: 13.2 % (ref 11.5–15.5)
WBC: 6.7 10*3/uL (ref 4.0–10.5)
nRBC: 0 % (ref 0.0–0.2)

## 2023-06-24 MED ORDER — POTASSIUM CHLORIDE CRYS ER 20 MEQ PO TBCR
40.0000 meq | EXTENDED_RELEASE_TABLET | Freq: Once | ORAL | Status: AC
  Administered 2023-06-24: 40 meq via ORAL
  Filled 2023-06-24: qty 2

## 2023-06-24 MED ORDER — METOPROLOL TARTRATE 5 MG/5ML IV SOLN: 5.0000 mg | INTRAVENOUS | Status: DC | PRN

## 2023-06-24 MED ORDER — HYDRALAZINE HCL 20 MG/ML IJ SOLN: 10.0000 mg | INTRAMUSCULAR | Status: DC | PRN

## 2023-06-24 MED ORDER — POTASSIUM CHLORIDE 10 MEQ/100ML IV SOLN
10.0000 meq | INTRAVENOUS | Status: AC
  Administered 2023-06-24 (×2): 10 meq via INTRAVENOUS
  Filled 2023-06-24 (×2): qty 100

## 2023-06-24 MED ORDER — IPRATROPIUM-ALBUTEROL 0.5-2.5 (3) MG/3ML IN SOLN: 3.0000 mL | RESPIRATORY_TRACT | Status: DC | PRN

## 2023-06-24 NOTE — Hospital Course (Addendum)
 Brief Narrative:   63 year old with history of osteoporosis, renal stone, HLD, anemia, T-cell lymphoma, HTN presented to the hospital with right-sided flank pain along with nausea vomiting.  Underwent shockwave lithotripsy by urology on 5/5 for large renal stone.  CT scan yesterday does not show any evidence of obstructive uropathy.  Admitting provider discussed case with urology, recommending conservative management After IV fluids she feels significantly better and renal function is back to baseline.  She would like to be discharged. For now told that I will be pausing her hydrochlorothiazide  along with potassium supplements.  This can be resumed when she follows with her PCP.  Repeat blood work.  Assessment & Plan:  Principal Problem:   AKI (acute kidney injury) (HCC) Active Problems:   Primary hypertension   Asthma   History of lymphoma   Hyperlipidemia   Hypokalemia   Right nephrolithiasis   Dehydration   Intractable pain   Intractable nausea and vomiting   Hydronephrosis, right   AKI (acute kidney injury) (HCC) -Likely prerenal in nature.  Baseline 0.7, admission 1.2.  Now resolved.   Nephrolithiasis with right-sided flank pain -Recently underwent lithotripsy.  This time CT scan shows small renal stones with moderate hydronephrosis but no obvious obstruction.  EDP discussed case with urology who recommends conservative management with hydration and pain control.  No acute intervention planned -Continue Flomax  - UA is negative   Asthma -Chronic stable   Hyperlipidemia -Continue atorvastatin    Primary hypertension Hypokalemia -Continue to hold hydrochlorothiazide  given dehydration upon discharge.  Resume after repeat blood work done with her PCP -IV as needed   History of lymphoma -Now in remission   Hypokalemia, resolved As needed patient   Anxiety and depression -Continue Prozac  and as needed Xanax    Obesity: Estimated body mass index is 34.06 kg/m as  calculated from the following:   Height as of this encounter: 5\' 8"  (1.727 m).   Weight as of this encounter: 101.6 kg.  -Follow-up with PCP in the outpatient for nutrition and weight loss counseling     DVT prophylaxis: SCDs Start: 06/22/23 1936    Code Status: Full Code Family Communication:   dcTODAY    Subjective: Seen at bedside.  Overall feeling great, wanting to be discharged   Examination:  General exam: Appears calm and comfortable  Respiratory system: Clear to auscultation. Respiratory effort normal. Cardiovascular system: S1 & S2 heard, RRR. No JVD, murmurs, rubs, gallops or clicks. No pedal edema. Gastrointestinal system: Abdomen is nondistended, soft and nontender. No organomegaly or masses felt. Normal bowel sounds heard. Central nervous system: Alert and oriented. No focal neurological deficits. Extremities: Symmetric 5 x 5 power. Skin: No rashes, lesions or ulcers Psychiatry: Judgement and insight appear normal. Mood & affect appropriate.

## 2023-06-24 NOTE — Plan of Care (Signed)

## 2023-06-24 NOTE — Plan of Care (Signed)
 Discharge instructions given.  Problem: Education: Goal: Knowledge of General Education information will improve Description: Including pain rating scale, medication(s)/side effects and non-pharmacologic comfort measures Outcome: Adequate for Discharge   Problem: Health Behavior/Discharge Planning: Goal: Ability to manage health-related needs will improve Outcome: Adequate for Discharge   Problem: Clinical Measurements: Goal: Ability to maintain clinical measurements within normal limits will improve Outcome: Adequate for Discharge Goal: Will remain free from infection Outcome: Adequate for Discharge Goal: Diagnostic test results will improve Outcome: Adequate for Discharge Goal: Respiratory complications will improve Outcome: Adequate for Discharge Goal: Cardiovascular complication will be avoided Outcome: Adequate for Discharge   Problem: Activity: Goal: Risk for activity intolerance will decrease Outcome: Adequate for Discharge   Problem: Nutrition: Goal: Adequate nutrition will be maintained Outcome: Adequate for Discharge   Problem: Coping: Goal: Level of anxiety will decrease Outcome: Adequate for Discharge   Problem: Elimination: Goal: Will not experience complications related to bowel motility Outcome: Adequate for Discharge Goal: Will not experience complications related to urinary retention Outcome: Adequate for Discharge   Problem: Pain Managment: Goal: General experience of comfort will improve and/or be controlled Outcome: Adequate for Discharge   Problem: Safety: Goal: Ability to remain free from injury will improve Outcome: Adequate for Discharge   Problem: Skin Integrity: Goal: Risk for impaired skin integrity will decrease Outcome: Adequate for Discharge

## 2023-06-24 NOTE — Discharge Summary (Signed)
 Physician Discharge Summary  NHIA MCNICHOL AOZ:[id number] DOB: 20-Sep-1960 DOA: 06/22/2023  PCP: Estill Hemming, DO  Admit date: 06/22/2023 Discharge date: 06/24/2023  Admitted From: Home Disposition: Home  Recommendations for Outpatient Follow-up:  Follow up with PCP in 1-2 weeks Please obtain BMP/CBC in one week your next doctors visit.  Repeat lab work with her PCP in about 1 week until then hold off on HCTZ and potassium supplements   Discharge Condition: Stable CODE STATUS: Full code Diet recommendation: Regular  Brief/Interim Summary: Brief Narrative:   63 year old with history of osteoporosis, renal stone, HLD, anemia, T-cell lymphoma, HTN presented to the hospital with right-sided flank pain along with nausea vomiting.  Underwent shockwave lithotripsy by urology on 5/5 for large renal stone.  CT scan yesterday does not show any evidence of obstructive uropathy.  Admitting provider discussed case with urology, recommending conservative management After IV fluids she feels significantly better and renal function is back to baseline.  She would like to be discharged. For now told that I will be pausing her hydrochlorothiazide  along with potassium supplements.  This can be resumed when she follows with her PCP.  Repeat blood work.  Assessment & Plan:  Principal Problem:   AKI (acute kidney injury) (HCC) Active Problems:   Primary hypertension   Asthma   History of lymphoma   Hyperlipidemia   Hypokalemia   Right nephrolithiasis   Dehydration   Intractable pain   Intractable nausea and vomiting   Hydronephrosis, right   AKI (acute kidney injury) (HCC) -Likely prerenal in nature.  Baseline 0.7, admission 1.2.  Now resolved.   Nephrolithiasis with right-sided flank pain -Recently underwent lithotripsy.  This time CT scan shows small renal stones with moderate hydronephrosis but no obvious obstruction.  EDP discussed case with urology who recommends conservative  management with hydration and pain control.  No acute intervention planned -Continue Flomax  - UA is negative   Asthma -Chronic stable   Hyperlipidemia -Continue atorvastatin    Primary hypertension Hypokalemia -Continue to hold hydrochlorothiazide  given dehydration upon discharge.  Resume after repeat blood work done with her PCP -IV as needed   History of lymphoma -Now in remission   Hypokalemia, resolved As needed patient   Anxiety and depression -Continue Prozac  and as needed Xanax    Obesity: Estimated body mass index is 34.06 kg/m as calculated from the following:   Height as of this encounter: 5\' 8"  (1.727 m).   Weight as of this encounter: 101.6 kg.  -Follow-up with PCP in the outpatient for nutrition and weight loss counseling     DVT prophylaxis: SCDs Start: 06/22/23 1936    Code Status: Full Code Family Communication:   dcTODAY    Subjective: Seen at bedside.  Overall feeling great, wanting to be discharged   Examination:  General exam: Appears calm and comfortable  Respiratory system: Clear to auscultation. Respiratory effort normal. Cardiovascular system: S1 & S2 heard, RRR. No JVD, murmurs, rubs, gallops or clicks. No pedal edema. Gastrointestinal system: Abdomen is nondistended, soft and nontender. No organomegaly or masses felt. Normal bowel sounds heard. Central nervous system: Alert and oriented. No focal neurological deficits. Extremities: Symmetric 5 x 5 power. Skin: No rashes, lesions or ulcers Psychiatry: Judgement and insight appear normal. Mood & affect appropriate.    Discharge Diagnoses:  Principal Problem:   AKI (acute kidney injury) (HCC) Active Problems:   Primary hypertension   Asthma   History of lymphoma   Hyperlipidemia   Hypokalemia   Right nephrolithiasis  Dehydration   Intractable pain   Intractable nausea and vomiting   Hydronephrosis, right      Discharge Exam: Vitals:   06/23/23 1953 06/24/23 0616  BP:  (!) 146/74 (!) 157/73  Pulse: 67 65  Resp: 18 16  Temp: 98.1 F (36.7 C) 97.9 F (36.6 C)  SpO2: 96% 96%   Vitals:   06/23/23 1051 06/23/23 1055 06/23/23 1953 06/24/23 0616  BP: 130/64 129/70 (!) 146/74 (!) 157/73  Pulse: 80 74 67 65  Resp: 20 20 18 16   Temp: 98.1 F (36.7 C) 97.9 F (36.6 C) 98.1 F (36.7 C) 97.9 F (36.6 C)  TempSrc: Oral Oral Oral Oral  SpO2:   96% 96%  Weight:      Height:          Discharge Instructions   Allergies as of 06/24/2023       Reactions   Betadine [povidone Iodine] Hives, Itching   Codeine Nausea And Vomiting        Medication List     PAUSE taking these medications    hydrochlorothiazide  25 MG tablet Wait to take this until your doctor or other care provider tells you to start again. Commonly known as: HYDRODIURIL  Take 1 tablet by mouth once daily   potassium chloride  SA 20 MEQ tablet Wait to take this until your doctor or other care provider tells you to start again. Commonly known as: KLOR-CON  M Take 1 tablet (20 mEq total) by mouth 2 (two) times daily.       STOP taking these medications    Ibuprofen  200 MG Caps       TAKE these medications    alendronate  70 MG tablet Commonly known as: FOSAMAX  Take 1 tablet (70 mg total) by mouth once a week. Take with a full glass of water  on an empty stomach. What changed: when to take this   ALPRAZolam  0.25 MG tablet Commonly known as: XANAX  Take 1 tablet (0.25 mg total) by mouth 3 (three) times daily as needed for anxiety.   atorvastatin  20 MG tablet Commonly known as: LIPITOR Take 1 tablet (20 mg total) by mouth daily. Pt needs office visit for further refills What changed: additional instructions   cyclobenzaprine  5 MG tablet Commonly known as: FLEXERIL  TAKE 1 TABLET BY MOUTH AT BEDTIME What changed:  when to take this reasons to take this   docusate sodium  100 MG capsule Commonly known as: COLACE Take 300 mg by mouth daily as needed for mild constipation  or moderate constipation.   FLUoxetine  40 MG capsule Commonly known as: PROZAC  Take 1 capsule by mouth once daily   ondansetron  4 MG disintegrating tablet Commonly known as: ZOFRAN -ODT Take 1 tablet (4 mg total) by mouth every 8 (eight) hours as needed. What changed: reasons to take this   oxyCODONE -acetaminophen  5-325 MG tablet Commonly known as: PERCOCET/ROXICET Take 1 tablet by mouth every 6 (six) hours as needed for severe pain (pain score 7-10).   tamsulosin  0.4 MG Caps capsule Commonly known as: FLOMAX  Take 1 capsule (0.4 mg total) by mouth daily.   Vitamin D  (Ergocalciferol ) 1.25 MG (50000 UNIT) Caps capsule Commonly known as: DRISDOL  Take 1 capsule by mouth once a week What changed: when to take this        Follow-up Information     Roel Clarity R, DO Follow up in 1 week(s).   Specialty: Family Medicine Contact information: [address] [place] [address] [phone]  Allergies  Allergen Reactions   Betadine [Povidone Iodine] Hives and Itching   Codeine Nausea And Vomiting    You were cared for by a hospitalist during your hospital stay. If you have any questions about your discharge medications or the care you received while you were in the hospital after you are discharged, you can call the unit and asked to speak with the hospitalist on call if the hospitalist that took care of you is not available. Once you are discharged, your primary care physician will handle any further medical issues. Please note that no refills for any discharge medications will be authorized once you are discharged, as it is imperative that you return to your primary care physician (or establish a relationship with a primary care physician if you do not have one) for your aftercare needs so that they can reassess your need for medications and monitor your lab values.  You were cared for by a hospitalist during your hospital stay. If you  have any questions about your discharge medications or the care you received while you were in the hospital after you are discharged, you can call the unit and asked to speak with the hospitalist on call if the hospitalist that took care of you is not available. Once you are discharged, your primary care physician will handle any further medical issues. Please note that NO REFILLS for any discharge medications will be authorized once you are discharged, as it is imperative that you return to your primary care physician (or establish a relationship with a primary care physician if you do not have one) for your aftercare needs so that they can reassess your need for medications and monitor your lab values.  Please request your Prim.MD to go over all Hospital Tests and Procedure/Radiological results at the follow up, please get all Hospital records sent to your Prim MD by signing hospital release before you go home.  Get CBC, CMP, 2 view Chest X ray checked  by Primary MD during your next visit or SNF MD in 5-7 days ( we routinely change or add medications that can affect your baseline labs and fluid status, therefore we recommend that you get the mentioned basic workup next visit with your PCP, your PCP may decide not to get them or add new tests based on their clinical decision)  On your next visit with your primary care physician please Get Medicines reviewed and adjusted.  If you experience worsening of your admission symptoms, develop shortness of breath, life threatening emergency, suicidal or homicidal thoughts you must seek medical attention immediately by calling 911 or calling your MD immediately  if symptoms less severe.  You Must read complete instructions/literature along with all the possible adverse reactions/side effects for all the Medicines you take and that have been prescribed to you. Take any new Medicines after you have completely understood and accpet all the possible adverse  reactions/side effects.   Do not drive, operate heavy machinery, perform activities at heights, swimming or participation in water  activities or provide baby sitting services if your were admitted for syncope or siezures until you have seen by Primary MD or a Neurologist and advised to do so again.  Do not drive when taking Pain medications.   Procedures/Studies: CT Renal Stone Study Result Date: 06/22/2023 CLINICAL DATA:  Abdominal/flank pain, stone suspected. EXAM: CT ABDOMEN AND PELVIS WITHOUT CONTRAST TECHNIQUE: Multidetector CT imaging of the abdomen and pelvis was performed following the standard protocol without IV contrast. RADIATION DOSE  REDUCTION: This exam was performed according to the departmental dose-optimization program which includes automated exposure control, adjustment of the mA and/or kV according to patient size and/or use of iterative reconstruction technique. COMPARISON:  CT scan renal stone protocol from 06/10/2023. FINDINGS: Lower chest: There are dependent changes in the visualized lung bases. No overt consolidation. No pleural effusion. The heart is normal in size. No pericardial effusion. Hepatobiliary: The liver is normal in size. Non-cirrhotic configuration. No suspicious mass. No intrahepatic or extrahepatic bile duct dilation. Gallbladder is surgically absent. Pancreas: Unremarkable. No pancreatic ductal dilatation or surrounding inflammatory changes. Spleen: Within normal limits. No focal lesion. Adrenals/Urinary Tract: Adrenal glands are unremarkable. No suspicious renal mass within the limitations of this unenhanced exam. No left nephroureterolithiasis or obstructive uropathy. There is moderate right hydronephrosis and hydroureter secondary to several 2-3 mm sized calculi in the right upper ureter, which are slightly inferior in location when compared to the prior exam. There also multiple 1-2 mm calculi in the right vesicoureteric junction region. No right nephro  lithiasis. Redemonstration of several dystrophic calcifications in the right kidney upper pole, posterolaterally. There are tiny right UVJ calculus, as described above. Urinary bladder is otherwise within normal limits. Stomach/Bowel: No disproportionate dilation of the small or large bowel loops. No evidence of abnormal bowel wall thickening or inflammatory changes. The appendix is unremarkable. There are multiple diverticula mainly in the sigmoid colon, without imaging signs of diverticulitis. Vascular/Lymphatic: No ascites or pneumoperitoneum. No abdominal or pelvic lymphadenopathy, by size criteria. No aneurysmal dilation of the major abdominal arteries. Reproductive: The uterus is surgically absent. No large adnexal mass. Other: Patient is status post hernia repair with mesh and metallic anchors in the epigastric region. The soft tissues and abdominal wall are otherwise unremarkable. Musculoskeletal: No suspicious osseous lesions. There are mild multilevel degenerative changes in the visualized spine. IMPRESSION: 1. There are several 2-3 mm sized calculi in the right upper ureter, which are slightly inferior in location when compared to the prior exam. There are also multiple 1-2 mm calculi in the right vesicoureteric junction region. There is moderate right hydronephrosis and hydroureter. No left nephroureterolithiasis or obstructive uropathy. 2. Multiple other nonacute observations, as described above. Electronically Signed   By: Beula Brunswick M.D.   On: 06/22/2023 11:35   Abdomen 1 view (KUB) Result Date: 06/20/2023 CLINICAL DATA:  Proximal right ureteral stone on CT. EXAM: ABDOMEN - 1 VIEW COMPARISON:  CT 06/10/2023 FINDINGS: The previous right ureteral calculus is tentatively visualized projecting over the mid lower right kidney measuring 7-8 mm. Multiple surgical clips in the upper abdomen. Tacks from prior abdominal hernia repair. Normal bowel gas pattern, moderate colonic stool burden. IMPRESSION:  The previous 7-8 mm right ureteral calculus is tentatively visualized projecting over the mid lower right kidney. Electronically Signed   By: Chadwick Colonel M.D.   On: 06/20/2023 15:46   DG Abd 1 View Result Date: 06/20/2023 CLINICAL DATA:  Right nephrolithiasis. EXAM: ABDOMEN - 1 VIEW COMPARISON:  Radiograph 06/14/2023.  CT 06/10/2023 FINDINGS: 6 mm calcification to the right of L3 likely corresponds to ureteral stone. The additional right renal calcifications on prior CT are not well seen by radiograph. Surgical clips in the upper abdomen. Tacks from prior abdominal hernia repair. Normal bowel gas pattern. IMPRESSION: A 6 mm calcification to the right of L3 likely corresponds to ureteral stone. Electronically Signed   By: Chadwick Colonel M.D.   On: 06/20/2023 15:45   CT Renal Stone Study Result Date: 06/10/2023 CLINICAL  DATA:  Flank pain EXAM: CT ABDOMEN AND PELVIS WITHOUT CONTRAST TECHNIQUE: Multidetector CT imaging of the abdomen and pelvis was performed following the standard protocol without IV contrast. RADIATION DOSE REDUCTION: This exam was performed according to the departmental dose-optimization program which includes automated exposure control, adjustment of the mA and/or kV according to patient size and/or use of iterative reconstruction technique. COMPARISON:  None Available. FINDINGS: Lower chest: No acute abnormality. No pleural or pericardial effusions. Small hiatal hernia. Postop changes at the GE junction. Hepatobiliary: No focal liver abnormality is seen. Status post cholecystectomy. No biliary dilatation. Pancreas: Unremarkable. No pancreatic ductal dilatation or surrounding inflammatory changes. Spleen: Normal in size without focal abnormality. Adrenals/Urinary Tract: Adrenal glands are unremarkable. Proximal right ureteral 8 mm stone with obstruction. No nephrolithiasis on the left. Unremarkable urinary bladder. Stomach/Bowel: Stomach is within normal limits. Appendix appears normal.  No evidence of bowel wall thickening, distention, or inflammatory changes. Vascular/Lymphatic: No significant vascular findings are present. No enlarged abdominal or pelvic lymph nodes. Reproductive: Status post hysterectomy. No adnexal masses. Other: No abdominal wall hernia or abnormality. No abdominopelvic ascites. Musculoskeletal: No acute osseous abnormalities. Grade 1 L5 retrolisthesis. IMPRESSION: Proximal right ureteral 8 mm stone with obstruction. Electronically Signed   By: Sydell Eva M.D.   On: 06/10/2023 21:25     The results of significant diagnostics from this hospitalization (including imaging, microbiology, ancillary and laboratory) are listed below for reference.     Microbiology: No results found for this or any previous visit (from the past 240 hours).   Labs: BNP (last 3 results) No results for input(s): "BNP" in the last 8760 hours. Basic Metabolic Panel: Recent Labs  Lab 06/22/23 1019 06/22/23 1947 06/23/23 0452 06/24/23 0424  NA 140 138 137 138  K 3.6 3.0* 3.5 3.1*  CL 102 102 102 102  CO2 27 27 26 26   GLUCOSE 91 133* 103* 96  BUN 13 12 9 8   CREATININE 1.28* 1.15* 1.28* 0.62  CALCIUM  9.7 8.9 8.8* 8.6*  MG  --  1.9 2.0  --   PHOS  --  3.6 3.5  --    Liver Function Tests: Recent Labs  Lab 06/22/23 1947 06/23/23 0452  AST 20 16  ALT 16 15  ALKPHOS 113 105  BILITOT 1.2 1.0  PROT 6.7 6.1*  ALBUMIN 3.6 3.3*   No results for input(s): "LIPASE", "AMYLASE" in the last 168 hours. No results for input(s): "AMMONIA" in the last 168 hours. CBC: Recent Labs  Lab 06/22/23 1019 06/23/23 0452 06/24/23 0424  WBC 9.2 9.9 6.7  NEUTROABS 6.7  --   --   HGB 11.8* 11.0* 10.4*  HCT 36.2 34.8* 32.0*  MCV 88.1 92.1 89.9  PLT 245 225 214   Cardiac Enzymes: Recent Labs  Lab 06/22/23 1947  CKTOTAL 110   BNP: Invalid input(s): "POCBNP" CBG: No results for input(s): "GLUCAP" in the last 168 hours. D-Dimer No results for input(s): "DDIMER" in the last  72 hours. Hgb A1c No results for input(s): "HGBA1C" in the last 72 hours. Lipid Profile No results for input(s): "CHOL", "HDL", "LDLCALC", "TRIG", "CHOLHDL", "LDLDIRECT" in the last 72 hours. Thyroid  function studies No results for input(s): "TSH", "T4TOTAL", "T3FREE", "THYROIDAB" in the last 72 hours.  Invalid input(s): "FREET3" Anemia work up No results for input(s): "VITAMINB12", "FOLATE", "FERRITIN", "TIBC", "IRON", "RETICCTPCT" in the last 72 hours. Urinalysis    Component Value Date/Time   COLORURINE YELLOW 06/22/2023 1019   APPEARANCEUR CLEAR 06/22/2023 1019   APPEARANCEUR  Clear 06/14/2023 0000   LABSPEC 1.025 06/22/2023 1019   LABSPEC 1.030 06/11/2013 1559   PHURINE 5.5 06/22/2023 1019   GLUCOSEU NEGATIVE 06/22/2023 1019   GLUCOSEU Negative 06/11/2013 1559   HGBUR LARGE (A) 06/22/2023 1019   HGBUR large 12/05/2007 1251   BILIRUBINUR NEGATIVE 06/22/2023 1019   BILIRUBINUR 1+ (A) 06/14/2023 0000   BILIRUBINUR Negative 06/11/2013 1559   KETONESUR NEGATIVE 06/22/2023 1019   PROTEINUR NEGATIVE 06/22/2023 1019   UROBILINOGEN 0.2 08/20/2015 0949   UROBILINOGEN 0.2 06/11/2013 1559   NITRITE NEGATIVE 06/22/2023 1019   LEUKOCYTESUR NEGATIVE 06/22/2023 1019   LEUKOCYTESUR Negative 06/11/2013 1559   Sepsis Labs Recent Labs  Lab 06/22/23 1019 06/23/23 0452 06/24/23 0424  WBC 9.2 9.9 6.7   Microbiology No results found for this or any previous visit (from the past 240 hours).   Time coordinating discharge:  I have spent 35 minutes face to face with the patient and on the ward discussing the patients care, assessment, plan and disposition with other care givers. >50% of the time was devoted counseling the patient about the risks and benefits of treatment/Discharge disposition and coordinating care.   SIGNED:   Maggie Schooner, MD  [facility] 06/24/2023, 12:49 PM   If 7PM-7AM, please contact night-coverage

## 2023-06-24 NOTE — Progress Notes (Signed)
 Chaplain visited in response to [place] for assistance with AD paperwork. Chaplain provided AD education and left paperwork with pt to review and fill out, pt will notify nurse when paperwork is filled out so chaplain team can assist with completing the paperwork in the presence of notary and witnesses.  Chaplain [place], [place] [facility]    06/24/23 1300  Spiritual Encounters  Type of Visit Initial  Care provided to: Patient  Referral source Patient request  Reason for visit Advance directives  OnCall Visit No  Spiritual Framework  Presenting Themes Goals in life/care  Interventions  Spiritual Care Interventions Made Established relationship of care and support;Decision-making support/facilitation

## 2023-06-27 ENCOUNTER — Telehealth: Payer: Self-pay

## 2023-06-27 NOTE — Transitions of Care (Post Inpatient/ED Visit) (Signed)
 06/27/2023  Name: Anna Jackson MRN: [id number] DOB: 20-Apr-1960  Today's TOC FU Call Status: Today's TOC FU Call Status:: Successful TOC FU Call Completed TOC FU Call Complete Date: 06/27/23 Patient's Name and Date of Birth confirmed.  Transition Care Management Follow-up Telephone Call Date of Discharge: 06/24/23 Discharge Facility: [facility]) Type of Discharge: Inpatient Admission Primary Inpatient Discharge Diagnosis:: Acute Kidney Injury How have you been since you were released from the hospital?: Better Any questions or concerns?: No  Items Reviewed: Did you receive and understand the discharge instructions provided?: Yes Medications obtained,verified, and reconciled?: Yes (Medications Reviewed) Any new allergies since your discharge?: No Dietary orders reviewed?: NA Do you have support at home?: Yes  Medications Reviewed Today: Medications Reviewed Today     Reviewed by Cathye Coca, LPN (Licensed Practical Nurse) on 06/27/23 at 1036  Med List Status: <None>   Medication Order Taking? Sig Documenting Provider Last Dose Status Informant  alendronate  (FOSAMAX ) 70 MG tablet [id number] Yes Take 1 tablet (70 mg total) by mouth once a week. Take with a full glass of water  on an empty stomach.  Patient taking differently: Take 70 mg by mouth every Saturday. Take with a full glass of water  on an empty stomach.   Estill Hemming, DO Taking Active Self  ALPRAZolam  (XANAX ) 0.25 MG tablet [id number] Yes Take 1 tablet (0.25 mg total) by mouth 3 (three) times daily as needed for anxiety. Estill Hemming, DO Taking Active Self  atorvastatin  (LIPITOR) 20 MG tablet [id number] Yes Take 1 tablet (20 mg total) by mouth daily. Pt needs office visit for further refills  Patient taking differently: Take 20 mg by mouth daily.   Estill Hemming, DO Taking Active Self  cyclobenzaprine  (FLEXERIL ) 5 MG tablet [id number] Yes TAKE 1 TABLET BY MOUTH AT BEDTIME  Patient  taking differently: Take 5 mg by mouth daily as needed for muscle spasms.   Adah Acron, MD Taking Active Self  docusate sodium  (COLACE) 100 MG capsule [id number] Yes Take 300 mg by mouth daily as needed for mild constipation or moderate constipation. [provider] Taking Active Self           Med Note Iola Manila   Tue Dec 25, 2019  9:57 AM)    FLUoxetine  (PROZAC ) 40 MG capsule [id number] Yes Take 1 capsule by mouth once daily Crecencio Dodge, Yvonne R, DO Taking Active Self  hydrochlorothiazide  (HYDRODIURIL ) 25 MG tablet [id number] No Take 1 tablet by mouth once daily  Patient not taking: Reported on 06/22/2023   Estill Hemming, DO Not Taking Active Self           Med Note Guido Leeks, Lavonia Powers   Wed Jun 22, 2023  8:10 PM) PROVIDER: The patient said she is in need of a NEW Rx, please  ondansetron  (ZOFRAN -ODT) 4 MG disintegrating tablet [id number] Yes Take 1 tablet (4 mg total) by mouth every 8 (eight) hours as needed.  Patient taking differently: Take 4 mg by mouth every 8 (eight) hours as needed for nausea or vomiting (dissolve orally).   Smoot, Genevive Ket, PA-C Taking Active Self           Med Note Guido Leeks, Lavonia Powers   Wed Jun 22, 2023  7:45 PM)    oxyCODONE -acetaminophen  (PERCOCET/ROXICET) 5-325 MG tablet [id number] Yes Take 1 tablet by mouth every 6 (six) hours as needed for severe pain (pain score 7-10). Stoneking, Ponce Brisker., MD Taking Active  Self  potassium chloride  SA (KLOR-CON  M) 20 MEQ tablet [id number] No Take 1 tablet (20 mEq total) by mouth 2 (two) times daily.  Patient not taking: Reported on 06/27/2023   Estill Hemming, DO Not Taking Active Self  tamsulosin  (FLOMAX ) 0.4 MG CAPS capsule [id number] Yes Take 1 capsule (0.4 mg total) by mouth daily. Smoot, Genevive Ket, PA-C Taking Active Self  Vitamin D , Ergocalciferol , (DRISDOL ) 1.25 MG (50000 UNIT) CAPS capsule [id number] Yes Take 1 capsule by mouth once a week  Patient taking differently: Take 50,000 Units by mouth every Saturday.    Estill Hemming, DO Taking Active Self            Home Care and Equipment/Supplies: Were Home Health Services Ordered?: NA Any new equipment or medical supplies ordered?: NA  Functional Questionnaire: Do you need assistance with bathing/showering or dressing?: No Do you need assistance with meal preparation?: No Do you need assistance with eating?: No Do you have difficulty maintaining continence: No Do you need assistance with getting out of bed/getting out of a chair/moving?: No Do you have difficulty managing or taking your medications?: No  Follow up appointments reviewed: PCP Follow-up appointment confirmed?: Yes Date of PCP follow-up appointment?: 06/30/23 Follow-up Provider: Dr. Crecencio Dodge Specialist [facility] Follow-up appointment confirmed?: Yes Date of Specialist follow-up appointment?: 06/29/23 Follow-Up Specialty Provider:: Urology Do you need transportation to your follow-up appointment?: No Do you understand care options if your condition(s) worsen?: Yes-patient verbalized understanding    SIGNATURE Seabron [place]ress, LPN [facility] Advisor [place] [facility] You Are. We Are. [facility] Direct Dial [phone]

## 2023-06-29 ENCOUNTER — Encounter: Payer: Self-pay | Admitting: Urology

## 2023-06-29 ENCOUNTER — Ambulatory Visit (INDEPENDENT_AMBULATORY_CARE_PROVIDER_SITE_OTHER): Admitting: Urology

## 2023-06-29 ENCOUNTER — Ambulatory Visit (HOSPITAL_BASED_OUTPATIENT_CLINIC_OR_DEPARTMENT_OTHER)
Admission: RE | Admit: 2023-06-29 | Discharge: 2023-06-29 | Disposition: A | Discharge status: Home / Self Care | Source: Ambulatory Visit | Attending: Urology | Admitting: Urology

## 2023-06-29 VITALS — BP 117/80 | HR 66 | Ht 68.0 in | Wt 224.0 lb

## 2023-06-29 DIAGNOSIS — N201 Calculus of ureter: Secondary | ICD-10-CM

## 2023-06-29 DIAGNOSIS — N200 Calculus of kidney: Secondary | ICD-10-CM | POA: Diagnosis present

## 2023-06-29 LAB — URINALYSIS, ROUTINE W REFLEX MICROSCOPIC
Bilirubin, UA: NEGATIVE
Glucose, UA: NEGATIVE
Nitrite, UA: NEGATIVE
Protein,UA: NEGATIVE
Specific Gravity, UA: 1.01 (ref 1.005–1.030)
Urobilinogen, Ur: 0.2 mg/dL (ref 0.2–1.0)
pH, UA: 7 (ref 5.0–7.5)

## 2023-06-29 LAB — MICROSCOPIC EXAMINATION

## 2023-06-29 NOTE — Progress Notes (Signed)
 Assessment: 1. Ureteral calculus; right     Plan: I reviewed the patient records from her recent ER visit. I personally reviewed the CT study from 06/22/2023 with results as noted below. KUB from today reviewed.  Some small calcifications seen in the upper right renal shadow consistent with known right renal parenchymal calcifications.  No obvious calcifications seen along the expected course of the right ureter. Recommend continuing tamsulosin  to assist with passage of stone fragments. Stone prevention discussed and information provided. Return to office in 1 month.  Chief Complaint:  Chief Complaint  Patient presents with   Nephrolithiasis    History of Present Illness:  Anna Jackson is a 63 y.o. female who is seen for further evaluation of a right ureteral calculus. She was seen in the emergency room on 06/10/2023 with acute onset of right-sided flank pain. Evaluation showed a normal white count, normal renal function, and no evidence of UTI. CT imaging showed a 8 mm stone in the proximal right ureter with obstruction; no other stones seen. She has only had slight pain since leaving the ER.  No nausea or vomiting.  No fevers or chills.  She does report some difficulty voiding.  No dysuria or gross hematuria.  No prior history of kidney stones. She underwent right shockwave lithotripsy on 06/20/2023. She was admitted to the hospital on 06/22/2023 due to right sided flank pain and AKI. CT imaging from 06/22/2023 showed several 2-3 mm calculi in the right upper ureter and multiple 1-2 mm calculi in the right distal ureter near the UVJ with moderate right hydronephrosis. She was discharged in the hospital on 06/24/2023.  Her renal function was normal at that time.  Her pain had significantly improved.  She returns today for follow-up.  She has not had any pain since her discharge from the hospital on 06/24/2023.  She reports passing a number of stone fragments while in the hospital.  No  recent passage of fragments.  No flank pain, dysuria, or gross hematuria.  No fevers or chills.  Portions of the above documentation were copied from a prior visit for review purposes only.  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   Mental disorder    Neuropathic pain 04/04/2014   Non Hodgkin's lymphoma (HCC) 2015   Skin infection 11/02/2013   Unspecified asthma(493.90)    Unspecified essential hypertension    URI (upper respiratory infection) 01/15/2014   Vitamin D  deficiency     Past Surgical History:  Past Surgical History:  Procedure Laterality Date   AXILLARY LYMPH NODE DISSECTION Left 03/09/2013   Procedure: EXCISION LEFT AXILLARY LYMPH NODES;  Surgeon: Levert Ready, MD;  Location: [facility];  Service: General;  Laterality: Left;   BREAST BIOPSY Left 02/13/2013   high risk   BREAST EXCISIONAL BIOPSY Left 03/09/2013   high risk   CERVICAL CONIZATION W/BX  CHOLECYSTECTOMY  1995   Open, done with Hiatal hernia repair   ESOPHAGEAL MANOMETRY N/A 01/12/2017   Procedure: ESOPHAGEAL MANOMETRY (EM);  Surgeon: Albertina Hugger, MD;  Location: [facility];  Service: Gastroenterology;  Laterality: N/A;   EXTRACORPOREAL SHOCK WAVE LITHOTRIPSY Right 06/20/2023   Procedure: LITHOTRIPSY, ESWL;  Surgeon: Mellie Sprinkle., MD;  Location: [facility];  Service: Urology;  Laterality: Right;   INCISIONAL HERNIA REPAIR  2004   Dr Salena Craven.  Parietex 20x15cm mesh   KNEE SURGERY Right  02/2010   Dr Mevelyn Acton, Arthroscopic   NISSEN FUNDOPLICATION  1995   OPEN, Dr Dellie Fergusson   ORIF WRIST FRACTURE Right 12/25/2019   Procedure: #1 right wrist/radius open reduction internal fixation comminuted complex distal radius fracture with DVR Biomet cross lock plate and screw construct.  This was a greater than 3 part intra-articular fracture.  #2 AP lateral and oblique x-rays performed examined and interpreted by myself #3 evaluation anesthesia ulnar styloid fracture with subsequent closed treatment based upon stability    PORTACATH PLACEMENT N/A 03/23/2013   Procedure: INSERTION PORT-A-CATH;  Surgeon: Levert Ready, MD;  Location: [facility] OR;  Service: General;  Laterality: N/A;   TUBAL LIGATION     VAGINAL HYSTERECTOMY  2002   Dr Art Harlon Light    Allergies:  Allergies  Allergen Reactions   Betadine [Povidone Iodine] Hives and Itching   Codeine Nausea And Vomiting    Family History:  Family History  Problem Relation Age of Onset   Heart disease Mother    High blood pressure Mother    High Cholesterol Mother    Anxiety disorder Mother    Heart disease Father    High Cholesterol Father    High blood pressure Father    Heart disease Brother    Alcoholism Brother    Breast cancer Maternal Grandmother 48   Cancer Maternal Grandmother 78       breast   Diabetes Cousin    Coronary artery disease Other        with hernia repair   Migraines Other    Hypertension Other    Colon cancer Neg Hx     Social History:  Social History   Tobacco Use   Smoking status: Never   Smokeless tobacco: Never  Vaping Use   Vaping status: Never Used  Substance Use Topics   Alcohol use: Yes    Comment: occasional   Drug use: No    ROS: Constitutional:  Negative for fever, chills, weight loss CV: Negative for chest pain, previous MI, hypertension Respiratory:  Negative for shortness of breath, wheezing, sleep apnea, frequent cough GI:  Negative for nausea, vomiting, bloody stool,  GERD  Physical exam: BP 117/80   Pulse 66   Ht 5\' 8"  (1.727 m)   Wt 224 lb (101.6 kg)   BMI 34.06 kg/m  GENERAL APPEARANCE:  Well appearing, well developed, well nourished, NAD HEENT:  Atraumatic, normocephalic, oropharynx clear NECK:  Supple without lymphadenopathy or thyromegaly ABDOMEN:  Soft, non-tender, no masses EXTREMITIES:  Moves all extremities well, without clubbing, cyanosis, or edema NEUROLOGIC:  Alert and oriented x 3, normal gait, CN II-XII grossly intact MENTAL STATUS:  appropriate BACK:  Non-tender to palpation, No CVAT SKIN:  Warm, dry, and intact  Results: U/A: 11-30 WBCs, 3-10 RBCs, few bacteria

## 2023-06-30 ENCOUNTER — Encounter: Payer: Self-pay | Admitting: Family Medicine

## 2023-06-30 ENCOUNTER — Ambulatory Visit: Admitting: Family Medicine

## 2023-06-30 VITALS — BP 118/80 | HR 72 | Temp 98.0°F | Resp 18 | Ht 68.0 in | Wt 226.8 lb

## 2023-06-30 DIAGNOSIS — N289 Disorder of kidney and ureter, unspecified: Secondary | ICD-10-CM

## 2023-06-30 NOTE — Progress Notes (Signed)
 Established Patient Office Visit  Subjective   Patient ID: Anna Jackson, female    DOB: 08/14/1960  Age: 63 y.o. MRN: [id number]  Chief Complaint  Patient presents with   Hospitalization Follow-up    HPI Discussed the use of AI scribe software for clinical note transcription with the patient, who gave verbal consent to proceed.  History of Present Illness Anna Jackson is a 63 year old female who presents for follow-up after hospitalization for kidney stones and dehydration.  She was hospitalized due to severe dehydration and kidney stones. Initially, she experienced significant pain post-procedure, which was not alleviated by her pain medication, prompting her to visit the ER. Blood tests revealed severe dehydration and impaired kidney function, leading to a three-day hospital stay for fluid administration. This treatment facilitated the passage of kidney stones and improved her condition.  Her kidney function is improving, although there is still a small amount of blood in her urine. She is currently off hydrochlorothiazide  (HCTZ) and potassium supplements, which were previously administered for swelling and blood pressure management. During her hospital stay, she received two bags of potassium, which caused significant discomfort.  Her family history includes her mother passing away at 61 and her father at 58 due to a drug overdose. She is concerned about her health, noting that she has outlived her family members and is mindful of her hydration to prevent future kidney stones.  She inquires about safe pain management options, mentioning that ibuprofen  is effective for her knee pain.   Patient Active Problem List   Diagnosis Date Noted   Intractable pain 06/23/2023   Intractable nausea and vomiting 06/23/2023   Hydronephrosis, right 06/23/2023   AKI (acute kidney injury) (HCC) 06/22/2023   Dehydration 06/22/2023   Ureteral calculus; right 06/14/2023   Right  nephrolithiasis 06/14/2023   Osteopenia 06/14/2023   Primary osteoarthritis of both knees 06/14/2023   Unilateral primary osteoarthritis, left knee 12/07/2022   Spondylolisthesis, lumbar region 12/07/2022   DOE (dyspnea on exertion) 10/12/2022   Bronchitis 02/12/2022   Vitamin D  deficiency 12/01/2021   Depression 12/01/2021   Class 2 severe obesity with serious comorbidity and body mass index (BMI) of 35.0 to 35.9 in adult [facility]) 12/01/2021   Chest pain 07/02/2021   Other fatigue 02/25/2021   Lymphoma, 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   Major depressive disorder with single episode, in full remission (HCC) 12/09/2020   Closed fracture of distal end of right radius 12/26/2019   Right wrist fracture 12/25/2019   Angina at rest [facility]) 08/02/2019   Hypokalemia 07/28/2019   Chest pain, rule out acute myocardial infarction 07/27/2019   Recurrent hiatal hernia s/p robotic PEH repair/Nissen 03/16/2017 03/16/2017   Hiatal hernia    Acute bronchitis 06/14/2016   B12 deficiency 10/10/2014   Neuropathic pain 04/04/2014   Hyperlipidemia 03/12/2014   Obesity (BMI 30-39.9) 01/08/2014   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   Headache 05/16/2013   History of lymphoma 03/20/2013   Obesity, morbid (HCC)    Barrett's esophagus 08/10/2010   Heme positive stool 06/30/2010   Depression, major, single episode, in partial remission (HCC) 03/04/2009   GERD 12/08/2007   POSTMENOPAUSAL STATUS 11/21/2007   Anxiety state 05/09/2007   Pain in joint, lower leg 10/20/2006   HERPES SIMPLEX, UNCOMPLICATED 05/27/2006   ONYCHOMYCOSIS, TOENAILS 05/27/2006   Primary hypertension 04/26/2006   Asthma 04/26/2006   HEADACHE 04/26/2006   UMBILICAL HERNIORRHAPHY, HX OF 04/26/2006   Past Medical History:  Diagnosis Date  Adenopathy    left axillary   Anemia    Anxiety    Anxiety state, unspecified    Asthma    no problem with asthma in several years   Barrett esophagus    Chest pain    Constipation     Depression    Esophageal reflux    GERD (gastroesophageal reflux disease)    Headache 05/16/2013   Herpes simplex without mention of complication    Hyperlipidemia 03/12/2014   Hypertension    Intractable nausea and vomiting 06/23/2023   Joint pain    Lymphoma, 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   Mental disorder    Neuropathic pain 04/04/2014   Non Hodgkin's lymphoma (HCC) 2015   Skin infection 11/02/2013   Unspecified asthma(493.90)    Unspecified essential hypertension    URI (upper respiratory infection) 01/15/2014   Vitamin D  deficiency    Past Surgical History:  Procedure Laterality Date   AXILLARY LYMPH NODE DISSECTION Left 03/09/2013   Procedure: EXCISION LEFT AXILLARY LYMPH NODES;  Surgeon: Levert Ready, MD;  Location: [facility];  Service: General;  Laterality: Left;   BREAST BIOPSY Left 02/13/2013   high risk   BREAST EXCISIONAL BIOPSY Left 03/09/2013   high risk   CERVICAL CONIZATION W/BX     CHOLECYSTECTOMY  1995   Open, done with Hiatal hernia repair   ESOPHAGEAL MANOMETRY N/A 01/12/2017   Procedure: ESOPHAGEAL MANOMETRY (EM);  Surgeon: Albertina Hugger, MD;  Location: [facility];  Service: Gastroenterology;  Laterality: N/A;   EXTRACORPOREAL SHOCK WAVE LITHOTRIPSY Right 06/20/2023   Procedure: LITHOTRIPSY, ESWL;  Surgeon: Mellie Sprinkle., MD;  Location: [facility];  Service: Urology;  Laterality: Right;   INCISIONAL HERNIA REPAIR  2004   Dr Salena Craven.  Parietex 20x15cm mesh   KNEE SURGERY Right 02/2010   Dr Mevelyn Acton, Arthroscopic   NISSEN FUNDOPLICATION  1995   OPEN, Dr Dellie Fergusson   ORIF WRIST FRACTURE Right 12/25/2019   Procedure: #1 right wrist/radius open reduction internal fixation comminuted complex distal radius fracture with DVR Biomet cross lock plate and screw construct.  This was a greater than 3 part intra-articular fracture.  #2 AP lateral and oblique x-rays performed examined and interpreted by myself #3 evaluation anesthesia ulnar styloid fracture  with subsequent closed treatment based upon stability    PORTACATH PLACEMENT N/A 03/23/2013   Procedure: INSERTION PORT-A-CATH;  Surgeon: Levert Ready, MD;  Location[facility] OR;  Service: General;  Laterality: N/A;   TUBAL LIGATION     VAGINAL HYSTERECTOMY  2002   Dr Art Harlon Light   Social History   Tobacco Use   Smoking status: Never   Smokeless tobacco: Never  Vaping Use   Vaping status: Never Used  Substance Use Topics   Alcohol use: Yes    Comment: occasional   Drug use: No   Social History   Socioeconomic History   Marital status: Significant Other    Spouse name: Not on file   Number of children: 1   Years of education: Not on file   Highest education level: GED or equivalent  Occupational History   Occupation: RECEIVING Marine scientist: [employer]   Occupation: Armed forces operational officer  Tobacco Use   Smoking status: Never   Smokeless tobacco: Never  Vaping Use   Vaping status: Never Used  Substance and Sexual Activity   Alcohol use: Yes    Comment: occasional   Drug use: No   Sexual activity: Not Currently  Partners: Male  Other Topics Concern   Not on file  Social History Narrative   Exercise-- walking   Social Drivers of Health   Financial Resource Strain: Low Risk  (06/14/2023)   Overall Financial Resource Strain (CARDIA)    Difficulty of Paying Living Expenses: Not very hard  Food Insecurity: Patient Declined (06/23/2023)   Hunger Vital Sign    Worried About Running Out of Food in the Last Year: Patient declined    Ran Out of Food in the Last Year: Patient declined  Recent Concern: Food Insecurity - Food Insecurity Present (06/22/2023)   Hunger Vital Sign    Worried About Running Out of Food in the Last Year: Sometimes true    Ran Out of Food in the Last Year: Never true  Transportation Needs: No Transportation Needs (06/22/2023)   PRAPARE - Administrator, Civil Service (Medical): No    Lack of Transportation (Non-Medical): No  Physical  Activity: Unknown (06/14/2023)   Exercise Vital Sign    Days of Exercise per Week: 0 days    Minutes of Exercise per Session: Not on file  Stress: No Stress Concern Present (06/14/2023)   [employer] of Occupational Health - Occupational Stress Questionnaire    Feeling of Stress : Only a little  Social Connections: Moderately Integrated (06/14/2023)   Social Connection and Isolation Panel [NHANES]    Frequency of Communication with Friends and Family: More than three times a week    Frequency of Social Gatherings with Friends and Family: More than three times a week    Attends Religious Services: 1 to 4 times per year    Active Member of [employer] or Organizations: No    Attends Engineer, structural: Not on file    Marital Status: Living with partner  Intimate Partner Violence: Not At Risk (06/22/2023)   Humiliation, Afraid, Rape, and Kick questionnaire    Fear of Current or Ex-Partner: No    Emotionally Abused: No    Physically Abused: No    Sexually Abused: No   Family Status  Relation Name Status   Mother  Deceased   Father  Deceased   Brother  Deceased at age 51   MGM  (Not Specified)   Building control surveyor   Other  (Not Specified)   Other  (Not Specified)   Other  (Not Specified)   Neg Hx  (Not Specified)  No partnership data on file   Family History  Problem Relation Age of Onset   Heart disease Mother    High blood pressure Mother    High Cholesterol Mother    Anxiety disorder Mother    Heart disease Father    High Cholesterol Father    High blood pressure Father    Heart disease Brother    Alcoholism Brother    Breast cancer Maternal Grandmother 48   Cancer Maternal Grandmother 50       breast   Diabetes Cousin    Coronary artery disease Other        with hernia repair   Migraines Other    Hypertension Other    Colon cancer Neg Hx    Allergies  Allergen Reactions   Betadine [Povidone Iodine] Hives and Itching   Codeine Nausea And Vomiting       Review of Systems  Constitutional:  Negative for chills, fever and malaise/fatigue.  HENT:  Negative for congestion and hearing loss.   Eyes:  Negative for discharge.  Respiratory:  Negative for cough, sputum production and shortness of breath.   Cardiovascular:  Negative for chest pain, palpitations and leg swelling.  Gastrointestinal:  Negative for abdominal pain, blood in stool, constipation, diarrhea, heartburn, nausea and vomiting.  Genitourinary:  Negative for dysuria, frequency, hematuria and urgency.  Musculoskeletal:  Negative for back pain, falls and myalgias.  Skin:  Negative for rash.  Neurological:  Negative for dizziness, sensory change, loss of consciousness, weakness and headaches.  Endo/Heme/Allergies:  Negative for environmental allergies. Does not bruise/bleed easily.  Psychiatric/Behavioral:  Negative for depression and suicidal ideas. The patient is not nervous/anxious and does not have insomnia.       Objective:     BP 118/80 (BP Location: Left Arm, Patient Position: Sitting, Cuff Size: Large)   Pulse 72   Temp 98 F (36.7 C) (Oral)   Resp 18   Ht 5\' 8"  (1.727 m)   Wt 226 lb 12.8 oz (102.9 kg)   SpO2 99%   BMI 34.48 kg/m  BP Readings from Last 3 Encounters:  06/30/23 118/80  06/29/23 117/80  06/24/23 (!) 157/73   Wt Readings from Last 3 Encounters:  06/30/23 226 lb 12.8 oz (102.9 kg)  06/29/23 224 lb (101.6 kg)  06/22/23 223 lb 15.8 oz (101.6 kg)   SpO2 Readings from Last 3 Encounters:  06/30/23 99%  06/24/23 96%  06/20/23 100%      Physical Exam Vitals and nursing note reviewed.  Constitutional:      General: She is not in acute distress.    Appearance: Normal appearance. She is well-developed.  HENT:     Head: Normocephalic and atraumatic.  Eyes:     General: No scleral icterus.       Right eye: No discharge.        Left eye: No discharge.  Cardiovascular:     Rate and Rhythm: Normal rate and regular rhythm.     Heart sounds: No  murmur heard. Pulmonary:     Effort: Pulmonary effort is normal. No respiratory distress.     Breath sounds: Normal breath sounds.  Musculoskeletal:        General: Normal range of motion.     Cervical back: Normal range of motion and neck supple.     Right lower leg: No edema.     Left lower leg: No edema.  Skin:    General: Skin is warm and dry.  Neurological:     Mental Status: She is alert and oriented to person, place, and time.  Psychiatric:        Mood and Affect: Mood normal.        Behavior: Behavior normal.        Thought Content: Thought content normal.        Judgment: Judgment normal.      Results for orders placed or performed in visit on 06/30/23  CBC with Differential/Platelet  Result Value Ref Range   WBC 6.4 4.0 - 10.5 K/uL   RBC 3.96 3.87 - 5.11 Mil/uL   Hemoglobin 11.4 (L) 12.0 - 15.0 g/dL   HCT 23.5 (L) 57.3 - 22.0 %   MCV 86.0 78.0 - 100.0 fl   MCHC 33.6 30.0 - 36.0 g/dL   RDW 25.4 27.0 - 62.3 %   Platelets 319.0 150.0 - 400.0 K/uL   Neutrophils Relative % 65.2 43.0 - 77.0 %   Lymphocytes Relative 24.9 12.0 - 46.0 %   Monocytes Relative 7.6 3.0 - 12.0 %   Eosinophils Relative 1.7  0.0 - 5.0 %   Basophils Relative 0.6 0.0 - 3.0 %   Neutro Abs 4.2 1.4 - 7.7 K/uL   Lymphs Abs 1.6 0.7 - 4.0 K/uL   Monocytes Absolute 0.5 0.1 - 1.0 K/uL   Eosinophils Absolute 0.1 0.0 - 0.7 K/uL   Basophils Absolute 0.0 0.0 - 0.1 K/uL  Comprehensive metabolic panel with GFR  Result Value Ref Range   Sodium 140 135 - 145 mEq/L   Potassium 3.2 (L) 3.5 - 5.1 mEq/L   Chloride 103 96 - 112 mEq/L   CO2 28 19 - 32 mEq/L   Glucose, Bld 93 70 - 99 mg/dL   BUN 17 6 - 23 mg/dL   Creatinine, Ser 4.09 0.40 - 1.20 mg/dL   Total Bilirubin 0.4 0.2 - 1.2 mg/dL   Alkaline Phosphatase 100 39 - 117 U/L   AST 15 0 - 37 U/L   ALT 13 0 - 35 U/L   Total Protein 6.6 6.0 - 8.3 g/dL   Albumin 3.9 3.5 - 5.2 g/dL   GFR 81.19 (L) >14.78 mL/min   Calcium  8.9 8.4 - 10.5 mg/dL    Last  CBC Lab Results  Component Value Date   WBC 6.4 06/30/2023   HGB 11.4 (L) 06/30/2023   HCT 34.0 (L) 06/30/2023   MCV 86.0 06/30/2023   MCH 29.2 06/24/2023   RDW 14.0 06/30/2023   PLT 319.0 06/30/2023   Last metabolic panel Lab Results  Component Value Date   GLUCOSE 93 06/30/2023   NA 140 06/30/2023   K 3.2 (L) 06/30/2023   CL 103 06/30/2023   CO2 28 06/30/2023   BUN 17 06/30/2023   CREATININE 1.02 06/30/2023   GFR 59.75 (L) 06/30/2023   CALCIUM  8.9 06/30/2023   PHOS 3.5 06/23/2023   PROT 6.6 06/30/2023   ALBUMIN 3.9 06/30/2023   LABGLOB 2.1 08/12/2021   AGRATIO 2.0 08/12/2021   BILITOT 0.4 06/30/2023   ALKPHOS 100 06/30/2023   AST 15 06/30/2023   ALT 13 06/30/2023   ANIONGAP 10 06/24/2023   Last lipids Lab Results  Component Value Date   CHOL 141 08/27/2022   HDL 86 08/27/2022   LDLCALC 38 08/27/2022   LDLDIRECT 105.0 11/23/2010   TRIG 83 08/27/2022   CHOLHDL 1.6 08/27/2022   Last hemoglobin A1c Lab Results  Component Value Date   HGBA1C 5.7 (H) 08/27/2022   Last thyroid  functions Lab Results  Component Value Date   TSH 0.70 10/12/2022   T4TOTAL 9.1 10/12/2022   Last vitamin D  Lab Results  Component Value Date   VD25OH 48.56 10/12/2022   Last vitamin B12 and Folate Lab Results  Component Value Date   VITAMINB12 454 10/12/2022      The 10-year ASCVD risk score (Arnett DK, et al., 2019) is: 2.1%    Assessment & Plan:   Problem List Items Addressed This Visit   None Visit Diagnoses       Renal insufficiency    -  Primary   Relevant Orders   CBC with Differential/Platelet (Completed)   Comprehensive metabolic panel with GFR (Completed)     Assessment and Plan Assessment & Plan Dehydration   Severe dehydration post-procedure resulted in hospitalization and acute kidney injury, which resolved with intravenous fluids. Pain and reduced oral intake likely masked the severity of dehydration. Encourage increased oral fluid intake, especially  in summer, to prevent dehydration and advise hydration to prevent kidney stone recurrence.  Acute kidney injury   The acute kidney injury  was secondary to severe dehydration and improved with rehydration. Recent labs show improving kidney function. Order lab tests to assess current kidney function. A follow-up ultrasound is scheduled in a month to ensure stone clearance.  Kidney stones   Recent kidney stones passed after rehydration. There is a risk of recurrence with dehydration and medications like HCTZ. She is concerned about recurrence and has been advised on hydration and medication avoidance. Discontinue HCTZ to prevent kidney stone recurrence. Encourage adherence to the stone prevention diet from the urologist and advise avoiding medications that can cause stones. Recommend Tylenol  or ibuprofen  for pain management, cautioning against dehydration.    Return in about 3 months (around 09/30/2023), or if symptoms worsen or fail to improve.    Dodd Schmid R Lowne Chase, DO

## 2023-07-01 ENCOUNTER — Ambulatory Visit: Payer: Self-pay | Admitting: Family Medicine

## 2023-07-01 LAB — CBC WITH DIFFERENTIAL/PLATELET
Basophils Absolute: 0 10*3/uL (ref 0.0–0.1)
Basophils Relative: 0.6 % (ref 0.0–3.0)
Eosinophils Absolute: 0.1 10*3/uL (ref 0.0–0.7)
Eosinophils Relative: 1.7 % (ref 0.0–5.0)
HCT: 34 % — ABNORMAL LOW (ref 36.0–46.0)
Hemoglobin: 11.4 g/dL — ABNORMAL LOW (ref 12.0–15.0)
Lymphocytes Relative: 24.9 % (ref 12.0–46.0)
Lymphs Abs: 1.6 10*3/uL (ref 0.7–4.0)
MCHC: 33.6 g/dL (ref 30.0–36.0)
MCV: 86 fl (ref 78.0–100.0)
Monocytes Absolute: 0.5 10*3/uL (ref 0.1–1.0)
Monocytes Relative: 7.6 % (ref 3.0–12.0)
Neutro Abs: 4.2 10*3/uL (ref 1.4–7.7)
Neutrophils Relative %: 65.2 % (ref 43.0–77.0)
Platelets: 319 10*3/uL (ref 150.0–400.0)
RBC: 3.96 Mil/uL (ref 3.87–5.11)
RDW: 14 % (ref 11.5–15.5)
WBC: 6.4 10*3/uL (ref 4.0–10.5)

## 2023-07-01 LAB — COMPREHENSIVE METABOLIC PANEL WITH GFR
ALT: 13 U/L (ref 0–35)
AST: 15 U/L (ref 0–37)
Albumin: 3.9 g/dL (ref 3.5–5.2)
Alkaline Phosphatase: 100 U/L (ref 39–117)
BUN: 17 mg/dL (ref 6–23)
CO2: 28 meq/L (ref 19–32)
Calcium: 8.9 mg/dL (ref 8.4–10.5)
Chloride: 103 meq/L (ref 96–112)
Creatinine, Ser: 1.02 mg/dL (ref 0.40–1.20)
GFR: 59.75 mL/min — ABNORMAL LOW (ref >60.00)
Glucose, Bld: 93 mg/dL (ref 70–99)
Potassium: 3.2 meq/L — ABNORMAL LOW (ref 3.5–5.1)
Sodium: 140 meq/L (ref 135–145)
Total Bilirubin: 0.4 mg/dL (ref 0.2–1.2)
Total Protein: 6.6 g/dL (ref 6.0–8.3)

## 2023-07-04 NOTE — Patient Instructions (Signed)
Kidney Stones  Kidney stones are solid, rock-like deposits that form inside of the kidneys. The kidneys are a pair of organs that make urine. A kidney stone may form in a kidney and move into other parts of the urinary tract, including the tubes that connect the kidneys to the bladder (ureters), the bladder, and the tube that carries urine out of the body (urethra). As the stone moves through these areas, it can cause intense pain and block the flow of urine. Kidney stones are created when high levels of certain minerals are found in the urine. The stones are usually passed out of the body through urination, but in some cases, medical treatment may be needed to remove them. What are the causes? Kidney stones may be caused by: A condition in which certain glands produce too much parathyroid hormone (primary hyperparathyroidism), which causes too much calcium buildup in the blood. A buildup of uric acid crystals in the bladder (hyperuricosuria). Uric acid is a chemical that the body produces when you eat certain foods. It usually leaves the body in the urine. Narrowing (stricture) of one or both of the ureters. A kidney blockage that is present at birth (congenital obstruction). Past surgery on the kidney or the ureters. What increases the risk? The following factors may make you more likely to develop this condition: Having had a kidney stone in the past. Having a family history of kidney stones. Not drinking enough water. Eating a diet that is high in protein, salt (sodium), or sugar. Being overweight or obese. What are the signs or symptoms? Symptoms of a kidney stone may include: Pain in the side of the abdomen, right below the ribs (flank pain). Pain usually spreads (radiates) to the groin. Needing to urinate often or urgently. Painful urination. Blood in the urine (hematuria). Nausea. Vomiting. Fever and chills. How is this diagnosed? This condition may be diagnosed based on: Your  symptoms and medical history. A physical exam. Blood tests. Urine tests. These may be done before and after the stone passes out of your body through urination. Imaging tests, such as a CT scan, abdominal X-ray, or ultrasound. A procedure to examine the inside of the bladder (cystoscopy). How is this treated? Treatment for kidney stones depends on the size, location, and makeup of the stones. Kidney stones will often pass out of the body through urination. You may need to: Increase your fluid intake to help pass the stone. In some cases, you may be given fluids through an IV and may need to be monitored in the hospital. Take medicine for pain. Make changes in your diet to help prevent kidney stones from coming back. Sometimes, procedures are needed to remove a kidney stone. This may involve: A procedure to break up kidney stones using: A focused beam of light (laser therapy). Shock waves (extracorporeal shock wave lithotripsy). Surgery to remove kidney stones. This may be needed if you have severe pain or have stones that block your urinary tract. Follow these instructions at home: Medicines Take over-the-counter and prescription medicines only as told by your health care provider. Ask your health care provider if the medicine prescribed to you requires you to avoid driving or using heavy machinery. Eating and drinking Drink enough fluid to keep your urine pale yellow. You may be instructed to drink at least 8-10 glasses of water each day. This will help you pass the kidney stone. If directed, change your diet. This may include: Limiting how much sodium you eat. Eating more fruits   and vegetables. Limiting how much animal protein you eat. Animal proteins include red meat, poultry, fish, and eggs. Eating a normal amount of calcium (1,000-1,300 mg per day). Follow instructions from your health care provider about eating or drinking restrictions. General instructions Collect urine samples as  told by your health care provider. You may need to collect a urine sample: 24 hours after you pass the stone. 8-12 weeks after you pass the kidney stone, and every 6-12 months after that. Strain your urine every time you urinate, for as long as directed. Use the strainer that your health care provider recommends. Do not throw out the kidney stone after passing it. Keep the stone so it can be tested by your health care provider. Testing the makeup of your kidney stone may help prevent you from getting kidney stones in the future. Keep all follow-up visits. You may need follow-up X-rays or ultrasounds to make sure that your stone has passed. How is this prevented? To prevent another kidney stone: Drink enough fluid to keep your urine pale yellow. This is the best way to prevent kidney stones. Eat a healthy diet. Follow recommendations from your health care provider about foods to avoid. Recommendations vary depending on the type of kidney stone that you have. You may be instructed to eat a low-protein diet. Maintain a healthy weight. Where to find more information National Kidney Foundation (NKF): www.kidney.org [facility] (UCF): www.urologyhealth.org Contact a health care provider if: You have pain that gets worse or does not get better with medicine. Get help right away if: You have a fever or chills. You develop severe pain. You develop new abdominal pain. You faint. You are unable to urinate. Summary Kidney stones are solid, rock-like deposits that form inside of the kidneys. Kidney stones can cause nausea, vomiting, blood in the urine, abdominal pain, and the urge to urinate often. Treatment for kidney stones depends on the size, location, and makeup of the stones. Kidney stones will often pass out of the body through urination. Kidney stones can be prevented by drinking enough fluids, eating a healthy diet, and maintaining a healthy weight. This information is not intended  to replace advice given to you by your health care provider. Make sure you discuss any questions you have with your health care provider. Document Revised: 05/13/2021 Document Reviewed: 05/13/2021 Elsevier Patient Education  2024 Elsevier Inc.  

## 2023-08-05 ENCOUNTER — Ambulatory Visit: Admitting: Urology

## 2023-08-05 ENCOUNTER — Encounter: Payer: Self-pay | Admitting: Urology

## 2023-08-05 VITALS — BP 149/76 | HR 80 | Ht 68.0 in | Wt 214.0 lb

## 2023-08-05 DIAGNOSIS — N201 Calculus of ureter: Secondary | ICD-10-CM

## 2023-08-05 DIAGNOSIS — N200 Calculus of kidney: Secondary | ICD-10-CM

## 2023-08-05 LAB — URINALYSIS, ROUTINE W REFLEX MICROSCOPIC
Bilirubin, UA: NEGATIVE
Glucose, UA: NEGATIVE
Ketones, UA: NEGATIVE
Nitrite, UA: NEGATIVE
Protein,UA: NEGATIVE
Specific Gravity, UA: 1.025 (ref 1.005–1.030)
Urobilinogen, Ur: 0.2 mg/dL (ref 0.2–1.0)
pH, UA: 5.5 (ref 5.0–7.5)

## 2023-08-05 LAB — MICROSCOPIC EXAMINATION

## 2023-08-05 NOTE — Progress Notes (Signed)
 Assessment: 1. Ureteral calculus; right   2. Nephrolithiasis     Plan: Continue stone prevention. Schedule for renal ultrasound for follow-up after right shockwave lithotripsy. Will contact her with results.  Chief Complaint:  Chief Complaint  Patient presents with   Nephrolithiasis    History of Present Illness:  Anna Jackson is a 63 y.o. female who is seen for further evaluation of a right ureteral calculus. She was seen in the emergency room on 06/10/2023 with acute onset of right-sided flank pain. Evaluation showed a normal white count, normal renal function, and no evidence of UTI. CT imaging showed a 8 mm stone in the proximal right ureter with obstruction; no other stones seen. She has only had slight pain since leaving the ER.  No nausea or vomiting.  No fevers or chills.  She does report some difficulty voiding.  No dysuria or gross hematuria.  No prior history of kidney stones. She underwent right shockwave lithotripsy on 06/20/2023. She was admitted to the hospital on 06/22/2023 due to right sided flank pain and AKI. CT imaging from 06/22/2023 showed several 2-3 mm calculi in the right upper ureter and multiple 1-2 mm calculi in the right distal ureter near the UVJ with moderate right hydronephrosis. She was discharged in the hospital on 06/24/2023.  Her renal function was normal at that time.  Her pain had significantly improved.  At her visit on 06/29/2023, she had not had any pain since her discharge from the hospital on 06/24/2023.  She reported passing a number of stone fragments while in the hospital.  No flank pain, dysuria, or gross hematuria.  No fevers or chills. KUB from 06/29/2023 showed calcifications in the right renal shadow but no obvious ureteral calculi.  She returns today for follow-up.  She has not had any flank pain.  She has not passed any other stone fragments since her last visit.  No dysuria or gross hematuria.  Portions of the above documentation  were copied from a prior visit for review purposes only.  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   Mental disorder    Neuropathic pain 04/04/2014   Non Hodgkin's lymphoma (HCC) 2015   Skin infection 11/02/2013   Unspecified asthma(493.90)    Unspecified essential hypertension    URI (upper respiratory infection) 01/15/2014   Vitamin D  deficiency     Past Surgical History:  Past Surgical History:  Procedure Laterality Date   AXILLARY LYMPH NODE DISSECTION Left 03/09/2013   Procedure: EXCISION LEFT AXILLARY LYMPH NODES;  Surgeon: Levert Ready, MD;  Location: [facility];  Service: General;  Laterality: Left;   BREAST BIOPSY Left 02/13/2013   high risk   BREAST EXCISIONAL BIOPSY Left 03/09/2013   high risk   CERVICAL CONIZATION W/BX     CHOLECYSTECTOMY  1995   Open, done with Hiatal hernia repair   ESOPHAGEAL  MANOMETRY N/A 01/12/2017   Procedure: ESOPHAGEAL MANOMETRY (EM);  Surgeon: Albertina Hugger, MD;  [facility];  Service: Gastroenterology;  Laterality: N/A;   EXTRACORPOREAL SHOCK WAVE LITHOTRIPSY Right 06/20/2023   Procedure: LITHOTRIPSY, ESWL;  Surgeon: Mellie Sprinkle., MD;  Location: [facility];  Service: Urology;  Laterality: Right;   INCISIONAL HERNIA REPAIR  2004   Dr Salena Craven.  Parietex 20x15cm mesh   KNEE SURGERY Right 02/2010   Dr Mevelyn Acton, Arthroscopic   NISSEN FUNDOPLICATION  1995   OPEN, Dr Dellie Fergusson   ORIF WRIST FRACTURE Right 12/25/2019    Procedure: #1 right wrist/radius open reduction internal fixation comminuted complex distal radius fracture with DVR Biomet cross lock plate and screw construct.  This was a greater than 3 part intra-articular fracture.  #2 AP lateral and oblique x-rays performed examined and interpreted by myself #3 evaluation anesthesia ulnar styloid fracture with subsequent closed treatment based upon stability    PORTACATH PLACEMENT N/A 03/23/2013   Procedure: INSERTION PORT-A-CATH;  Surgeon: Levert Ready, MD;  Location: [facility] OR;  Service: General;  Laterality: N/A;   TUBAL LIGATION     VAGINAL HYSTERECTOMY  2002   Dr Art Harlon Light    Allergies:  Allergies  Allergen Reactions   Betadine [Povidone Iodine] Hives and Itching   Codeine Nausea And Vomiting    Family History:  Family History  Problem Relation Age of Onset   Heart disease Mother    High blood pressure Mother    High Cholesterol Mother    Anxiety disorder Mother    Heart disease Father    High Cholesterol Father    High blood pressure Father    Heart disease Brother    Alcoholism Brother    Breast cancer Maternal Grandmother 48   Cancer Maternal Grandmother 49       breast   Diabetes Cousin    Coronary artery disease Other        with hernia repair   Migraines Other    Hypertension Other    Colon cancer Neg Hx     Social History:  Social History   Tobacco Use   Smoking status: Never   Smokeless tobacco: Never  Vaping Use   Vaping status: Never Used  Substance Use Topics   Alcohol use: Yes    Comment: occasional   Drug use: No    ROS: Constitutional:  Negative for fever, chills, weight loss CV: Negative for chest pain, previous MI, hypertension Respiratory:  Negative for shortness of breath, wheezing, sleep apnea, frequent cough GI:  Negative for nausea, vomiting, bloody stool, GERD  Physical exam: BP (!) 149/76   Pulse 80   Ht 5' 8 (1.727 m)   Wt 214 lb (97.1 kg)   BMI 32.54 kg/m  GENERAL APPEARANCE:  Well  appearing, well developed, well nourished, NAD HEENT:  Atraumatic, normocephalic, oropharynx clear NECK:  Supple without lymphadenopathy or thyromegaly ABDOMEN:  Soft, non-tender, no masses EXTREMITIES:  Moves all extremities well, without clubbing, cyanosis, or edema NEUROLOGIC:  Alert and oriented x 3, normal gait, CN II-XII grossly intact MENTAL STATUS:  appropriate BACK:  Non-tender to palpation, No CVAT SKIN:  Warm, dry, and intact  Results: U/A: 6-10 WBCs, 3-10 RBCs

## 2023-08-30 ENCOUNTER — Ambulatory Visit (HOSPITAL_BASED_OUTPATIENT_CLINIC_OR_DEPARTMENT_OTHER): Admission: RE | Admit: 2023-08-30 | Source: Ambulatory Visit

## 2023-08-31 ENCOUNTER — Encounter (HOSPITAL_BASED_OUTPATIENT_CLINIC_OR_DEPARTMENT_OTHER): Payer: Self-pay

## 2023-08-31 ENCOUNTER — Ambulatory Visit (HOSPITAL_BASED_OUTPATIENT_CLINIC_OR_DEPARTMENT_OTHER): Attending: Urology

## 2023-09-23 ENCOUNTER — Other Ambulatory Visit: Payer: Self-pay | Admitting: Family Medicine

## 2023-09-23 ENCOUNTER — Telehealth: Payer: Self-pay

## 2023-09-23 DIAGNOSIS — F419 Anxiety disorder, unspecified: Secondary | ICD-10-CM

## 2023-09-23 MED ORDER — FLUOXETINE HCL 40 MG PO CAPS: 40.0000 mg | ORAL_CAPSULE | Freq: Every day | ORAL | 3 refills | Status: AC

## 2023-09-23 NOTE — Telephone Encounter (Signed)
 Patient requesting prozac 

## 2023-10-26 ENCOUNTER — Ambulatory Visit (INDEPENDENT_AMBULATORY_CARE_PROVIDER_SITE_OTHER): Admitting: Student

## 2023-10-26 ENCOUNTER — Encounter: Payer: Self-pay | Admitting: Student

## 2023-10-26 ENCOUNTER — Ambulatory Visit: Payer: Self-pay | Admitting: Student

## 2023-10-26 VITALS — BP 118/78 | HR 74 | Temp 97.9°F | Resp 12

## 2023-10-26 DIAGNOSIS — J45909 Unspecified asthma, uncomplicated: Secondary | ICD-10-CM

## 2023-10-26 DIAGNOSIS — R0981 Nasal congestion: Secondary | ICD-10-CM

## 2023-10-26 DIAGNOSIS — Z789 Other specified health status: Secondary | ICD-10-CM

## 2023-10-26 DIAGNOSIS — J069 Acute upper respiratory infection, unspecified: Secondary | ICD-10-CM | POA: Insufficient documentation

## 2023-10-26 LAB — POCT INFLUENZA A/B
Influenza A, POC: NEGATIVE
Influenza B, POC: NEGATIVE

## 2023-10-26 LAB — POC COVID19 BINAXNOW: SARS Coronavirus 2 Ag: NEGATIVE

## 2023-10-26 MED ORDER — CETIRIZINE HCL 10 MG PO TABS: 10.0000 mg | ORAL_TABLET | Freq: Every day | ORAL | 11 refills | Status: AC

## 2023-10-26 MED ORDER — FLUTICASONE PROPIONATE 50 MCG/ACT NA SUSP: 2.0000 | Freq: Every day | NASAL | 6 refills | Status: AC

## 2023-10-26 MED ORDER — ALBUTEROL SULFATE HFA 108 (90 BASE) MCG/ACT IN AERS: 1.0000 | INHALATION_SPRAY | RESPIRATORY_TRACT | 2 refills | Status: AC | PRN

## 2023-10-26 NOTE — Progress Notes (Signed)
 Chief Complaint  Patient presents with   Nasal Congestion   feels tight in chess and back   right ear feels like she in a tunnel   Anna Jackson is a 63 year old female with asthma who presents with respiratory symptoms and congestion.Symptoms began on Saturday evening with a  headache, mild sore throat, runny nose, and general malaise. COVID-19, flu, and strep tests on Monday were negative. Despite this, she was treated for strep with an antibiotic, possibly cefnadir, and an injection abx  at urgent care.  Her sore throat has resolved, but she now experiences ear fullness, nasal congestion with clear mucus, mild SOB. She has asthma but is not using an inhaler and does not have one at home. She is not using allergy medications despite having allergies.  She confirms nasal congestion, dry cough. There is no wet cough, fever, or significant mucus production. Her asthma has been well-controlled in the past without daily medications. Shortness of breath has been an issue only in the last few days, coinciding with her current symptoms.  Patient denies fever, chills, CP, palpitations, dyspnea, edema, HA, vision changes, N/V/D, abdominal pain, urinary symptoms, rash, weight changes, and recent illness or hospitalizations.   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   Mental disorder    Neuropathic pain 04/04/2014   Non Hodgkin's lymphoma (HCC) 2015   Skin infection 11/02/2013   Unspecified asthma(493.90)    Unspecified  essential hypertension    URI (upper respiratory infection) 01/15/2014   Vitamin D  deficiency     Objective BP 118/78 (BP Location: Right Arm, Patient Position: Sitting, Cuff Size: Normal)   Pulse 74   Temp 97.9 F (36.6 C) (Oral)   Resp 12   SpO2 100%  General: Awake, alert, appears stated age HEENT: AT, [place], ears patent b/l and TM's neg, nares patent w/o discharge, pharynx pink and without exudates, MMM Neck: No masses or asymmetry Heart: RRR Lungs: CTAB, no accessory muscle use Psych: Age appropriate judgment and insight, normal mood and affect  Mild asthma without complication, unspecified whether persistent - Plan: albuterol  (VENTOLIN  HFA) 108 (90 Base) MCG/ACT inhaler, cetirizine  (ZYRTEC ) 10 MG tablet, fluticasone  (FLONASE ) 50 MCG/ACT nasal spray  Allergy history unknown - Plan: cetirizine  (ZYRTEC ) 10 MG tablet, fluticasone  (FLONASE ) 50 MCG/ACT nasal spray   Asthma exacerbation and allergic rhinitis Negative for COVID, influenza, and streptococcal infection. Antibiotic treatment likely unnecessary. Suspect asthma exacerbation from allergies or virus - Rx- albuterol  inhaler, 1-2 puffs every 4 hours as needed for dyspnea or wheezing; Zyrtec  daily,  Flonase  for daily use to manage nasal congestion. - Retested for COVID and influenza today. - If COVID positive, prescribe Paxlovid. Continue to push fluids, practice good hand hygiene, cover mouth when coughing. F/u prn. If starting to experience fevers, shaking, or shortness of breath, seek immediate care. Pt voiced understanding and agreement to the plan.  Lalitha Ilyas L  Wheeler, DNP, AGNP-C 10/26/23 11:24 AM

## 2023-10-26 NOTE — Addendum Note (Signed)
 Addended by: ESTELLE GILLIS D on: 10/26/2023 12:04 PM   Modules accepted: Orders

## 2023-11-14 ENCOUNTER — Telehealth (HOSPITAL_BASED_OUTPATIENT_CLINIC_OR_DEPARTMENT_OTHER): Payer: Self-pay

## 2023-11-23 ENCOUNTER — Ambulatory Visit: Admitting: Orthopaedic Surgery

## 2023-11-23 ENCOUNTER — Other Ambulatory Visit

## 2023-11-23 VITALS — Ht 66.0 in | Wt 231.6 lb

## 2023-11-23 DIAGNOSIS — M1712 Unilateral primary osteoarthritis, left knee: Secondary | ICD-10-CM

## 2023-11-23 NOTE — Progress Notes (Signed)
 Office Visit Note   Patient: Anna Jackson           Date of Birth: 24-Mar-1960           MRN: [id number] Visit Date: 11/23/2023              Requested by: [address], [place], [place] [address] [place],  [place] [address] PCP: Antonio Meth, Jamee SAUNDERS, DO   Assessment & Plan: Visit Diagnoses:  1. Primary osteoarthritis of left knee     Plan: History of Present Illness Anna Jackson is a 63 year old female who presents with left knee pain for evaluation of worsening symptoms and consideration of knee replacement surgery.  She experiences left knee pain that has progressively worsened and now radiates to the side of her foot, particularly when standing. The pain occurs even without prolonged standing. A previous cortisone injection provided relief for only two days, and gel injections were denied by her insurance.  Her medical history includes high blood pressure, though she is not currently on medication. She has no diabetes, allergies to nickel, or history of blood clots. She does not smoke or consume alcohol.  Physical Exam MUSCULOSKELETAL: Left knee pain on flexion. Left knee full extension.  No joint effusion.  Medial joint line tenderness.  Assessment and Plan Left knee primary osteoarthritis Chronic left knee pain due to primary osteoarthritis confirmed by x-ray. Previous cortisone injection provided temporary relief. Insurance denied gel injections. Pain worsened, considering total knee replacement due to severity and impact on quality of life. - detailed surgical plan discussed - Debbie to confirm surgery date  Impression is severe left knee degenerative joint disease secondary to Osteoarthritis.  Patient has attempted conservative treatment for at least 6 consecutive weeks within the past 12 weeks, including but not limited to physical therapy, home exercise program, NSAIDs, activity modification, and/or corticosteroid injections. Despite these  efforts, symptoms have not improved or have worsened. Conservative measures have been deemed unsuccessful at this time. After a detailed discussion covering diagnosis and treatment options--including the risks, benefits, alternatives, and potential complications of surgical and nonsurgical management--the patient elected to proceed with surgery  Anticoagulants: No antithrombotic Postop anticoagulation: Eliquis Diabetic: No  Nickel allergy: No Prior DVT/PE: No Tobacco use: No Clearances needed for surgery: PCP Anticipated discharge dispo: Home   Follow-Up Instructions: No follow-ups on file.   Orders:  Orders Placed This Encounter  Procedures   XR KNEE 3 VIEW LEFT   No orders of the defined types were placed in this encounter.     Procedures: No procedures performed   Clinical Data: No additional findings.   Subjective: Chief Complaint  Patient presents with   Left Knee - Pain    HPI  Review of Systems  Constitutional: Negative.   HENT: Negative.    Eyes: Negative.   Respiratory: Negative.    Cardiovascular: Negative.   Endocrine: Negative.   Musculoskeletal: Negative.   Neurological: Negative.   Hematological: Negative.   Psychiatric/Behavioral: Negative.    All other systems reviewed and are negative.    Objective: Vital Signs: Ht 5' 6 (1.676 m)   Wt 231 lb 9.6 oz (105.1 kg)   BMI 37.38 kg/m   Physical Exam Vitals and nursing note reviewed.  Constitutional:      Appearance: She is well-developed.  HENT:     Head: Atraumatic.     Nose: Nose normal.  Eyes:     Extraocular Movements: Extraocular movements  intact.  Cardiovascular:     Pulses: Normal pulses.  Pulmonary:     Effort: Pulmonary effort is normal.  Abdominal:     Palpations: Abdomen is soft.  Musculoskeletal:     Cervical back: Neck supple.  Skin:    General: Skin is warm.     Capillary Refill: Capillary refill takes less than 2 seconds.  Neurological:     Mental Status: She is  alert. Mental status is at baseline.  Psychiatric:        Behavior: Behavior normal.        Thought Content: Thought content normal.        Judgment: Judgment normal.     Ortho Exam  Specialty Comments:  No specialty comments available.  Imaging: XR KNEE 3 VIEW LEFT Result Date: 11/23/2023 X-rays of the left knee show advanced tricompartmental osteoarthritis.  Bone-on-bone joint space narrowing.  Kellgren-Lawrence stage IV    PMFS History: Patient Active Problem List   Diagnosis Date Noted   Upper respiratory tract infection 10/26/2023   Allergy history unknown 10/26/2023   Intractable pain 06/23/2023   Intractable nausea and vomiting 06/23/2023   Hydronephrosis, right 06/23/2023   AKI (acute kidney injury) 06/22/2023   Dehydration 06/22/2023   Ureteral calculus; right 06/14/2023   Right nephrolithiasis 06/14/2023   Osteopenia 06/14/2023   Primary osteoarthritis of both knees 06/14/2023   Unilateral primary osteoarthritis, left knee 12/07/2022   Spondylolisthesis, lumbar region 12/07/2022   DOE (dyspnea on exertion) 10/12/2022   Bronchitis 02/12/2022   Vitamin D  deficiency 12/01/2021   Depression 12/01/2021   Class 2 severe obesity with serious comorbidity and body mass index (BMI) of 35.0 to 35.9 in adult 12/01/2021   Chest pain 07/02/2021   Other fatigue 02/25/2021   Lymphoma, 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   Major depressive disorder with single episode, in full remission 12/09/2020   Closed fracture of distal end of right radius 12/26/2019   Right wrist fracture 12/25/2019   Angina at rest 08/02/2019   Hypokalemia 07/28/2019   Chest pain, rule out acute myocardial infarction 07/27/2019   Recurrent hiatal hernia s/p robotic PEH repair/Nissen 03/16/2017 03/16/2017   Hiatal hernia    Acute bronchitis 06/14/2016   B12 deficiency 10/10/2014   Neuropathic pain 04/04/2014   Hyperlipidemia 03/12/2014   Obesity (BMI 30-39.9) 01/08/2014   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   Headache 05/16/2013   History of lymphoma 03/20/2013   Obesity, morbid (HCC)    Barrett's esophagus 08/10/2010   Heme positive stool 06/30/2010   Depression, major, single episode, in partial remission 03/04/2009   GERD 12/08/2007   POSTMENOPAUSAL STATUS 11/21/2007   Anxiety state 05/09/2007   Pain in joint, lower leg 10/20/2006   HERPES SIMPLEX, UNCOMPLICATED 05/27/2006   ONYCHOMYCOSIS, TOENAILS 05/27/2006   Primary hypertension 04/26/2006   Asthma 04/26/2006   HEADACHE 04/26/2006   UMBILICAL HERNIORRHAPHY, HX OF 04/26/2006   Past Medical History:  Diagnosis Date   Adenopathy    left axillary   Anemia    Anxiety    Anxiety state, unspecified    Asthma    no problem with asthma in several years   Barrett esophagus    Chest pain    Constipation    Depression    Esophageal reflux    GERD (gastroesophageal reflux disease)    Headache 05/16/2013   Herpes simplex without mention of complication    Hyperlipidemia 03/12/2014   Hypertension    Intractable nausea and vomiting 06/23/2023   Joint  pain    Lymphoma, 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   Mental disorder    Neuropathic pain 04/04/2014   Non Hodgkin's lymphoma (HCC) 2015   Skin infection 11/02/2013   Unspecified asthma(493.90)    Unspecified essential hypertension    URI (upper respiratory infection) 01/15/2014   Vitamin D  deficiency     Family History  Problem Relation Age of Onset   Heart disease Mother    High blood pressure Mother    High Cholesterol Mother    Anxiety disorder Mother    Heart disease Father    High Cholesterol Father    High blood pressure Father    Heart disease Brother    Alcoholism Brother    Breast cancer Maternal Grandmother 22   Cancer Maternal Grandmother 86       breast   Diabetes Cousin    Coronary artery disease Other        with hernia repair   Migraines Other    Hypertension Other    Colon cancer Neg Hx     Past Surgical History:  Procedure Laterality  Date   AXILLARY LYMPH NODE DISSECTION Left 03/09/2013   Procedure: EXCISION LEFT AXILLARY LYMPH NODES;  Surgeon: Elon CHRISTELLA Pacini, MD;  Location: [facility];  Service: General;  Laterality: Left;   BREAST BIOPSY Left 02/13/2013   high risk   BREAST EXCISIONAL BIOPSY Left 03/09/2013   high risk   CERVICAL CONIZATION W/BX     CHOLECYSTECTOMY  1995   Open, done with Hiatal hernia repair   ESOPHAGEAL MANOMETRY N/A 01/12/2017   Procedure: ESOPHAGEAL MANOMETRY (EM);  Surgeon: Legrand Victory LITTIE DOUGLAS, MD;  Location: [facility];  Service: Gastroenterology;  Laterality: N/A;   EXTRACORPOREAL SHOCK WAVE LITHOTRIPSY Right 06/20/2023   Procedure: LITHOTRIPSY, ESWL;  Surgeon: Roseann Adine PARAS., MD;  Location: [facility];  Service: Urology;  Laterality: Right;   INCISIONAL HERNIA REPAIR  2004   Dr Georgette Poli.  Parietex 20x15cm mesh   KNEE SURGERY Right 02/2010   Dr Amy, Arthroscopic   NISSEN FUNDOPLICATION  1995   OPEN, Dr Velinda Moats   ORIF WRIST FRACTURE Right 12/25/2019   Procedure: #1 right wrist/radius open reduction internal fixation comminuted complex distal radius fracture with DVR Biomet cross lock plate and screw construct.  This was a greater than 3 part intra-articular fracture.  #2 AP lateral and oblique x-rays performed examined and interpreted by myself #3 evaluation anesthesia ulnar styloid fracture with subsequent closed treatment based upon stability    PORTACATH PLACEMENT N/A 03/23/2013   Procedure: INSERTION PORT-A-CATH;  Surgeon: Elon CHRISTELLA Pacini, MD;  Location: [facility] OR;  Service: General;  Laterality: N/A;   TUBAL LIGATION     VAGINAL HYSTERECTOMY  2002   Dr Art Manda   Social History   Occupational History   Occupation: RECEIVING CLERK    Employer: [employer]   Occupation: Armed forces operational officer  Tobacco Use   Smoking status: Never   Smokeless tobacco: Never  Vaping Use   Vaping status: Never Used  Substance and Sexual Activity   Alcohol use: Yes    Comment: occasional    Drug use: No   Sexual activity: Not Currently    Partners: Male

## 2023-11-24 ENCOUNTER — Telehealth: Payer: Self-pay | Admitting: Family Medicine

## 2023-11-24 ENCOUNTER — Encounter: Payer: Self-pay | Admitting: Family Medicine

## 2023-11-24 NOTE — Telephone Encounter (Signed)
 Pt dropped off paper to be signed by pcp. Placed paper in pcps box. Please call pt didn't say if she wanted to pick up or have it faxed

## 2023-12-01 ENCOUNTER — Telehealth: Payer: Self-pay

## 2023-12-01 ENCOUNTER — Ambulatory Visit (INDEPENDENT_AMBULATORY_CARE_PROVIDER_SITE_OTHER): Admitting: Family Medicine

## 2023-12-01 ENCOUNTER — Encounter: Payer: Self-pay | Admitting: Family Medicine

## 2023-12-01 VITALS — BP 128/80 | HR 56 | Temp 98.4°F | Resp 18 | Ht 66.0 in | Wt 233.2 lb

## 2023-12-01 DIAGNOSIS — Z23 Encounter for immunization: Secondary | ICD-10-CM | POA: Diagnosis not present

## 2023-12-01 DIAGNOSIS — Z01818 Encounter for other preprocedural examination: Secondary | ICD-10-CM | POA: Diagnosis not present

## 2023-12-01 LAB — APTT: aPTT: 38.2 s — ABNORMAL HIGH (ref 25.4–36.8)

## 2023-12-01 LAB — PROTIME-INR
INR: 1.1 ratio — ABNORMAL HIGH (ref 0.8–1.0)
Prothrombin Time: 11.4 s (ref 9.6–13.1)

## 2023-12-01 NOTE — Telephone Encounter (Signed)
 PCP Clearance sheet given  to patient-11/23/2023. (L TKA)

## 2023-12-01 NOTE — Progress Notes (Signed)
 +  Subjective:    Patient ID: Anna Jackson, female    DOB: Jan 12, 1961, 63 y.o.   MRN: [id number]  Chief Complaint  Patient presents with   Pre-op Exam    HPI Patient is in today for pre op clearance for knee replacement with Dr JERRI.    Discussed the use of AI scribe software for clinical note transcription with the patient, who gave verbal consent to proceed.  History of Present Illness Anna Jackson is a 63 year old female who presents for evaluation of left knee pain and upcoming total knee arthroplasty.  She experiences severe pain in her left knee, radiating down to her foot, which makes standing or walking for extended periods difficult. The pain is constant and debilitating, preventing her from performing activities like yard work. She had arthroscopic surgery on the other knee about fifteen years ago, which was successful, but now the left knee causes significant discomfort.  The patient reports that an x-ray was taken last week on her left knee. She is scheduled for a total knee arthroplasty under general anesthesia, although the exact date is pending. She anticipates using a walker for stability during the first week post-surgery and expects significant pain initially, but hopes for improvement within two weeks.  She denies any problems with anesthesia, breathing, or dental issues. Her EKG was reported as fine. She is currently taking Fosamax  and has no medications that would cause bleeding. She has hearing difficulties with both men and women.    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   Mental disorder    Neuropathic pain 04/04/2014   Non Hodgkin's lymphoma (HCC) 2015   Skin infection 11/02/2013   Unspecified asthma(493.90)    Unspecified essential hypertension    URI (upper respiratory infection) 01/15/2014   Vitamin D  deficiency     Past Surgical History:  Procedure Laterality Date   AXILLARY LYMPH NODE DISSECTION Left 03/09/2013   Procedure: EXCISION LEFT AXILLARY LYMPH NODES;  Surgeon: Elon CHRISTELLA Pacini, MD;  Location: [facility];  Service: General;  Laterality: Left;   BREAST BIOPSY Left 02/13/2013   high risk   BREAST EXCISIONAL BIOPSY Left 03/09/2013   high risk   CERVICAL CONIZATION W/BX     CHOLECYSTECTOMY  1995   Open, done with Hiatal hernia repair   ESOPHAGEAL MANOMETRY N/A 01/12/2017   Procedure: ESOPHAGEAL MANOMETRY (EM);  Surgeon: Legrand Victory LITTIE DOUGLAS, MD;  Location: [facility];  Service: Gastroenterology;  Laterality: N/A;   EXTRACORPOREAL SHOCK WAVE LITHOTRIPSY Right 06/20/2023   Procedure: LITHOTRIPSY, ESWL;  Surgeon: Roseann Adine PARAS., MD;  Location: [facility];  Service: Urology;  Laterality: Right;   INCISIONAL HERNIA REPAIR  2004   Dr Georgette Poli.  Parietex 20x15cm mesh   KNEE SURGERY Right 02/2010   Dr Amy, Arthroscopic   NISSEN FUNDOPLICATION  1995   OPEN, Dr Velinda Moats   ORIF  WRIST FRACTURE Right 12/25/2019   Procedure: #1 right wrist/radius open reduction internal fixation comminuted complex distal radius fracture with DVR Biomet cross lock plate and screw construct.  This was a greater than 3 part intra-articular fracture.  #2 AP lateral and oblique x-rays performed examined and interpreted by myself #3 evaluation anesthesia ulnar styloid fracture with subsequent closed treatment based upon stability    PORTACATH PLACEMENT N/A 03/23/2013   Procedure: INSERTION PORT-A-CATH;  Surgeon: Elon CHRISTELLA Pacini, MD;  Location: [facility];  Service: General;   Laterality: N/A;   TUBAL LIGATION     VAGINAL HYSTERECTOMY  2002   Dr Art Manda    Family History  Problem Relation Age of Onset   Heart disease Mother    High blood pressure Mother    High Cholesterol Mother    Anxiety disorder Mother    Heart disease Father    High Cholesterol Father    High blood pressure Father    Heart disease Brother    Alcoholism Brother    Breast cancer Maternal Grandmother 48   Cancer Maternal Grandmother 50       breast   Diabetes Cousin    Coronary artery disease Other        with hernia repair   Migraines Other    Hypertension Other    Colon cancer Neg Hx     Social History   Socioeconomic History   Marital status: Significant Other    Spouse name: Not on file   Number of children: 1   Years of education: Not on file   Highest education level: GED or equivalent  Occupational History   Occupation: Armed forces operational officer: [employer]   Occupation: Armed forces operational officer  Tobacco Use   Smoking status: Never   Smokeless tobacco: Never  Vaping Use   Vaping status: Never Used  Substance and Sexual Activity   Alcohol use: Yes    Comment: occasional   Drug use: No   Sexual activity: Not Currently    Partners: Male  Other Topics Concern   Not on file  Social History Narrative   Exercise-- walking   Social Drivers of Health   Financial Resource Strain: Low Risk  (12/01/2023)   Overall Financial Resource Strain (CARDIA)    Difficulty of Paying Living Expenses: Not very hard  Food Insecurity: Food Insecurity Present (12/01/2023)   Hunger Vital Sign    Worried About Running Out of Food in the Last Year: Sometimes true    Ran Out of Food in the Last Year: Patient declined  Transportation Needs: Patient Declined (12/01/2023)   PRAPARE - Administrator, Civil Service (Medical): Patient declined    Lack of Transportation (Non-Medical): Patient declined  Physical Activity: Inactive (12/01/2023)   Exercise Vital Sign     Days of Exercise per Week: 0 days    Minutes of Exercise per Session: Not on file  Stress: No Stress Concern Present (12/01/2023)   [employer] of Occupational Health - Occupational Stress Questionnaire    Feeling of Stress: Only a little  Social Connections: Moderately Isolated (12/01/2023)   Social Connection and Isolation Panel    Frequency of Communication with Friends and Family: More than three times a week    Frequency of Social Gatherings with Friends and Family: More than three times a week    Attends Religious Services: 1 to 4 times per year    Active Member of Clubs or Organizations: No  Attends Banker Meetings: Not on file    Marital Status: Widowed  Intimate Partner Violence: Not At Risk (06/22/2023)   Humiliation, Afraid, Rape, and Kick questionnaire    Fear of Current or Ex-Partner: No    Emotionally Abused: No    Physically Abused: No    Sexually Abused: No    Outpatient Medications Prior to Visit  Medication Sig Dispense Refill   albuterol  (VENTOLIN  HFA) 108 (90 Base) MCG/ACT inhaler Inhale 1-2 puffs into the lungs every 4 (four) hours as needed for wheezing or shortness of breath. 8.5 g 2   alendronate  (FOSAMAX ) 70 MG tablet Take 1 tablet (70 mg total) by mouth once a week. Take with a full glass of water  on an empty stomach. 4 tablet 2   ALPRAZolam  (XANAX ) 0.25 MG tablet Take 1 tablet (0.25 mg total) by mouth 3 (three) times daily as needed for anxiety. 30 tablet 1   atorvastatin  (LIPITOR) 20 MG tablet Take 1 tablet (20 mg total) by mouth daily. Pt needs office visit for further refills 90 tablet 0   cetirizine  (ZYRTEC ) 10 MG tablet Take 1 tablet (10 mg total) by mouth daily. 30 tablet 11   cyclobenzaprine  (FLEXERIL ) 5 MG tablet TAKE 1 TABLET BY MOUTH AT BEDTIME 30 tablet 0   docusate sodium  (COLACE) 100 MG capsule Take 300 mg by mouth daily as needed for mild constipation or moderate constipation.     FLUoxetine  (PROZAC ) 40 MG capsule Take 1  capsule (40 mg total) by mouth daily. 90 capsule 3   fluticasone  (FLONASE ) 50 MCG/ACT nasal spray Place 2 sprays into both nostrils daily. 16 g 6   Vitamin D , Ergocalciferol , (DRISDOL ) 1.25 MG (50000 UNIT) CAPS capsule Take 1 capsule by mouth once a week 12 capsule 0   hydrochlorothiazide  (HYDRODIURIL ) 25 MG tablet Take 1 tablet by mouth once daily 90 tablet 0   potassium chloride  SA (KLOR-CON  M) 20 MEQ tablet Take 1 tablet (20 mEq total) by mouth 2 (two) times daily. 60 tablet 2   cefdinir (OMNICEF) 300 MG capsule Take 300 mg by mouth 2 (two) times daily.     No facility-administered medications prior to visit.    Allergies  Allergen Reactions   Betadine [Povidone Iodine] Hives and Itching   Codeine Nausea And Vomiting    Review of Systems  Constitutional:  Negative for fever and malaise/fatigue.  HENT:  Negative for congestion.   Eyes:  Negative for blurred vision.  Respiratory:  Negative for cough and shortness of breath.   Cardiovascular:  Negative for chest pain, palpitations and leg swelling.  Gastrointestinal:  Negative for abdominal pain, blood in stool, nausea and vomiting.  Genitourinary:  Negative for dysuria and frequency.  Musculoskeletal:  Positive for joint pain. Negative for back pain and falls.  Skin:  Negative for rash.  Neurological:  Negative for dizziness, loss of consciousness and headaches.  Endo/Heme/Allergies:  Negative for environmental allergies.  Psychiatric/Behavioral:  Negative for depression. The patient is not nervous/anxious.        Objective:    Physical Exam Vitals and nursing note reviewed.  Constitutional:      General: She is not in acute distress.    Appearance: Normal appearance. She is well-developed.  HENT:     Head: Normocephalic and atraumatic.  Eyes:     General: No scleral icterus.       Right eye: No discharge.        Left eye: No discharge.  Cardiovascular:  Rate and Rhythm: Normal rate and regular rhythm.     Heart  sounds: No murmur heard. Pulmonary:     Effort: Pulmonary effort is normal. No respiratory distress.     Breath sounds: Normal breath sounds.  Musculoskeletal:        General: Tenderness present.     Cervical back: Normal range of motion and neck supple.     Left knee: Swelling present. Decreased range of motion. Tenderness present.     Right lower leg: No edema.     Left lower leg: No edema.  Skin:    General: Skin is warm and dry.  Neurological:     Mental Status: She is alert and oriented to person, place, and time.  Psychiatric:        Mood and Affect: Mood normal.        Behavior: Behavior normal.        Thought Content: Thought content normal.        Judgment: Judgment normal.     BP 128/80 (BP Location: Left Arm, Patient Position: Sitting, Cuff Size: Large)   Pulse (!) 56   Temp 98.4 F (36.9 C) (Oral)   Resp 18   Ht 5' 6 (1.676 m)   Wt 233 lb 3.2 oz (105.8 kg)   SpO2 98%   BMI 37.64 kg/m  Wt Readings from Last 3 Encounters:  12/01/23 233 lb 3.2 oz (105.8 kg)  11/23/23 231 lb 9.6 oz (105.1 kg)  08/05/23 214 lb (97.1 kg)    Diabetic Foot Exam - Simple   No data filed    Lab Results  Component Value Date   WBC 6.4 06/30/2023   HGB 11.4 (L) 06/30/2023   HCT 34.0 (L) 06/30/2023   PLT 319.0 06/30/2023   GLUCOSE 93 06/30/2023   CHOL 141 08/27/2022   TRIG 83 08/27/2022   HDL 86 08/27/2022   LDLDIRECT 105.0 11/23/2010   LDLCALC 38 08/27/2022   ALT 13 06/30/2023   AST 15 06/30/2023   NA 140 06/30/2023   K 3.2 (L) 06/30/2023   CL 103 06/30/2023   CREATININE 1.02 06/30/2023   BUN 17 06/30/2023   CO2 28 06/30/2023   TSH 0.70 10/12/2022   INR 0.99 03/29/2013   HGBA1C 5.7 (H) 08/27/2022   MICROALBUR 0.8 11/09/2019    Lab Results  Component Value Date   TSH 0.70 10/12/2022   Lab Results  Component Value Date   WBC 6.4 06/30/2023   HGB 11.4 (L) 06/30/2023   HCT 34.0 (L) 06/30/2023   MCV 86.0 06/30/2023   PLT 319.0 06/30/2023   Lab Results   Component Value Date   NA 140 06/30/2023   K 3.2 (L) 06/30/2023   CHLORIDE 106 11/18/2016   CO2 28 06/30/2023   GLUCOSE 93 06/30/2023   BUN 17 06/30/2023   CREATININE 1.02 06/30/2023   BILITOT 0.4 06/30/2023   ALKPHOS 100 06/30/2023   AST 15 06/30/2023   ALT 13 06/30/2023   PROT 6.6 06/30/2023   ALBUMIN 3.9 06/30/2023   CALCIUM  8.9 06/30/2023   ANIONGAP 10 06/24/2023   EGFR 91 08/12/2021   GFR 59.75 (L) 06/30/2023   Lab Results  Component Value Date   CHOL 141 08/27/2022   Lab Results  Component Value Date   HDL 86 08/27/2022   Lab Results  Component Value Date   LDLCALC 38 08/27/2022   Lab Results  Component Value Date   TRIG 83 08/27/2022   Lab Results  Component Value Date  CHOLHDL 1.6 08/27/2022   Lab Results  Component Value Date   HGBA1C 5.7 (H) 08/27/2022   EKG--- nsr     Assessment & Plan:  Pre-op examination Assessment & Plan: Cleared for knee replacement   Orders: -     EKG 12-Lead -     CBC with Differential/Platelet -     Comprehensive metabolic panel with GFR -     Lipid panel -     TSH -     APTT -     Protime-INR  Need for influenza vaccination -     Flu vaccine trivalent PF, 6mos and older(Flulaval,Afluria,Fluarix,Fluzone)  Assessment and Plan Assessment & Plan Left knee osteoarthritis with severe pain and functional limitation   Chronic left knee osteoarthritis causes severe pain radiating to the foot, worsened by activities like walking. X-rays reveal bone-on-bone contact, significantly affecting her quality of life and daily activities. Proceed with total knee arthroplasty under general anesthesia. Coordinate with the surgical team to schedule the procedure promptly. Provide a temporary disability form for six months.  Hearing evaluation   She reports difficulty hearing both men and women. Evaluation is deferred until after knee surgery recovery. Arrange for a hearing test post-recovery.  General Health  Maintenance    Anna Jackson, [place]

## 2023-12-01 NOTE — Patient Instructions (Signed)
Preparing for Knee Replacement Preparing for your knee replacement surgery can make your recovery easier and more comfortable. Talk with your health care provider so you can learn what will happen before, during, and after surgery. Ask questions if there's something you don't understand. Tell a health care provider about: Any allergies you have. All medicines you're taking, including vitamins, herbs, eye drops, creams, and over-the-counter medicines. Any problems you or family members have had with anesthesia. Any bleeding problems you have. Any surgeries you've had. Any medical conditions you have. Whether you're pregnant or may be pregnant. What happens before the procedure? Visit your providers You'll need to have a physical exam. When you go to the exam, bring a list of all the medicines and supplements you take. This includes herbs and vitamins. You may need more tests to check if it's safe for you to have surgery. Visit your dentist. Get your teeth cleaned and checked before the surgery. Germs from your mouth or an open wound can travel to your new joint and infect it. Tell your dentist that you plan to have a knee replacement. Keep all appointments. Know the costs of surgery To find out about the costs, call your insurance provider as soon as you decide to have surgery. Ask questions like: How much of the surgery and hospital stay will be covered? What will be covered for: Medical device? Physical therapy? Care at home? Prepare your home Pick a recovery spot that's not your bed. It's good to sit up a lot while you're getting better. A reclining chair might be a good spot. Put things that you often use on a small table near your spot. These may include the TV remote, a cordless phone or your mobile phone, a book, a laptop, and a water glass. Put the things you need on shelves and in drawers that are as high as your kitchen counter. Do this in your kitchen, bathroom, and bedroom.  This way, everything you need will be easy to reach. You may be given a walker to use at home. Check if you have enough room to use it. Try walking around your home with your hands out about 6 inches (15 cm) from your sides. If you don't hit anything while doing this, then you have enough room. Try walking from: Your spot to your kitchen and bathroom. Your bed to the bathroom. Prepare some meals to freeze and reheat later. Make your home safe for recovery     To prevent tripping, clear your floors. Pick up any clutter and throw rugs. Consider getting: Grab bars to put in the shower and near the toilet. A raised toilet seat. This will help you get on and off the toilet more easily. A tub or shower bench. Prepare your body If you smoke, quit as soon as you can. If there's time, it is best to quit several months before surgery. Tell your provider if you use any products that contain nicotine or tobacco. These products include cigarettes, chewing tobacco, and vaping devices, such as e-cigarettes. These can delay healing after surgery. If you need help quitting, ask your provider. Talk to your provider about doing exercises before your surgery. Doing these exercises in the weeks before your surgery may help lessen pain and improve movement in your knee after surgery. Do the exercises given by your provider. Eat a healthy diet. Do not change your diet unless your provider tells you to do that. Do not drink any alcohol for at least 48 hours  before surgery. Plan your recovery In the first few weeks after surgery, doing your usual activities might be tough. You may get tired quickly, and you won't be able to move your leg as much. To make sure you have all the help you need after your surgery: Plan to have a responsible adult take you home from the hospital. You will not be allowed to drive. Your provider will tell you how many days you can expect to be in the hospital. Do not do your normal  tasks and duties for at least 4-6 weeks after surgery. This includes work, caring for others, and volunteering. Plan to have a responsible adult stay with you day and night for the first week. This person should be someone you're comfortable with. You may need this person to help you with your exercises and with personal care, such as bathing and using the toilet. If you live alone, arrange for someone to take care of your home and pets for the first 4-6 weeks after surgery. Arrange for drivers to take you to follow-up visits, the grocery store, and other places you may need to go for at least 4-6 weeks. Consider applying for a disability parking permit. To get an application, call your local department of motor vehicles (DMV) or your provider's office. This information is not intended to replace advice given to you by your health care provider. Make sure you discuss any questions you have with your health care provider. Document Revised: 05/14/2022 Document Reviewed: 05/14/2022 Elsevier Patient Education  2024 [employer].

## 2023-12-01 NOTE — Assessment & Plan Note (Signed)
Cleared for knee replacement

## 2023-12-02 LAB — CBC WITH DIFFERENTIAL/PLATELET
Basophils Absolute: 0.1 K/uL (ref 0.0–0.1)
Basophils Relative: 0.8 % (ref 0.0–3.0)
Eosinophils Absolute: 0.1 K/uL (ref 0.0–0.7)
Eosinophils Relative: 2.2 % (ref 0.0–5.0)
HCT: 39.4 % (ref 36.0–46.0)
Hemoglobin: 12.8 g/dL (ref 12.0–15.0)
Lymphocytes Relative: 23.8 % (ref 12.0–46.0)
Lymphs Abs: 1.5 K/uL (ref 0.7–4.0)
MCHC: 32.5 g/dL (ref 30.0–36.0)
MCV: 87 fl (ref 78.0–100.0)
Monocytes Absolute: 0.6 K/uL (ref 0.1–1.0)
Monocytes Relative: 8.7 % (ref 3.0–12.0)
Neutro Abs: 4.1 K/uL (ref 1.4–7.7)
Neutrophils Relative %: 64.5 % (ref 43.0–77.0)
Platelets: 295 K/uL (ref 150.0–400.0)
RBC: 4.53 Mil/uL (ref 3.87–5.11)
RDW: 14.1 % (ref 11.5–15.5)
WBC: 6.4 K/uL (ref 4.0–10.5)

## 2023-12-02 LAB — COMPREHENSIVE METABOLIC PANEL WITH GFR
ALT: 10 U/L (ref 0–35)
AST: 14 U/L (ref 0–37)
Albumin: 4.2 g/dL (ref 3.5–5.2)
Alkaline Phosphatase: 139 U/L — ABNORMAL HIGH (ref 39–117)
BUN: 14 mg/dL (ref 6–23)
CO2: 30 meq/L (ref 19–32)
Calcium: 9 mg/dL (ref 8.4–10.5)
Chloride: 101 meq/L (ref 96–112)
Creatinine, Ser: 0.73 mg/dL (ref 0.40–1.20)
GFR: 88.99 mL/min (ref >60.00)
Glucose, Bld: 92 mg/dL (ref 70–99)
Potassium: 3.8 meq/L (ref 3.5–5.1)
Sodium: 139 meq/L (ref 135–145)
Total Bilirubin: 0.7 mg/dL (ref 0.2–1.2)
Total Protein: 6.7 g/dL (ref 6.0–8.3)

## 2023-12-02 LAB — LIPID PANEL
Cholesterol: 156 mg/dL (ref 0–200)
HDL: 81.9 mg/dL (ref >39.00)
LDL Cholesterol: 57 mg/dL (ref 0–99)
NonHDL: 73.81
Total CHOL/HDL Ratio: 2
Triglycerides: 84 mg/dL (ref 0.0–149.0)
VLDL: 16.8 mg/dL (ref 0.0–40.0)

## 2023-12-02 LAB — TSH: TSH: 1.59 u[IU]/mL (ref 0.35–5.50)

## 2023-12-11 ENCOUNTER — Ambulatory Visit: Payer: Self-pay | Admitting: Family Medicine

## 2023-12-19 ENCOUNTER — Encounter: Payer: Self-pay | Admitting: Radiology

## 2024-01-09 ENCOUNTER — Other Ambulatory Visit: Payer: Self-pay | Admitting: Family Medicine

## 2024-01-09 DIAGNOSIS — F419 Anxiety disorder, unspecified: Secondary | ICD-10-CM

## 2024-01-09 NOTE — Telephone Encounter (Signed)
 Requesting: alprazolam  0.25mg   Contract: 08/02/19 UDS: 08/02/19 Last Visit: 12/01/23 Next Visit: None Last Refill: 08/27/22 #30 and 1RF   Please Advise

## 2024-01-16 ENCOUNTER — Encounter (HOSPITAL_COMMUNITY): Admission: RE | Admit: 2024-01-16 | Source: Ambulatory Visit

## 2024-02-08 ENCOUNTER — Encounter: Admitting: Orthopaedic Surgery

## 2024-03-01 ENCOUNTER — Other Ambulatory Visit: Payer: Self-pay | Admitting: Family Medicine

## 2024-03-01 DIAGNOSIS — M8580 Other specified disorders of bone density and structure, unspecified site: Secondary | ICD-10-CM

## 2024-03-08 NOTE — Patient Instructions (Signed)
 SURGICAL WAITING ROOM VISITATION  Patients having surgery or a procedure may have no more than 2 support people in the waiting area - these visitors may rotate.    Children ages 44 and under will not be able to visit patients in [facility] under most circumstances.   Visitors with respiratory illnesses are discouraged from visiting and should remain at home.  If the patient needs to stay at the hospital during part of their recovery, the visitor guidelines for inpatient rooms apply. Pre-op nurse will coordinate an appropriate time for 1 support person to accompany patient in pre-op.  This support person may not rotate.    Please refer to the [facility] website for the visitor guidelines for Inpatients (after your surgery is over and you are in a regular room).       Your procedure is scheduled on: 03/23/2024    Report to [facility] Main Entrance    Report to admitting at  0715 AM   Call this number if you have problems the morning of surgery [phone]   Do not eat food :After Midnight.   After Midnight you may have the following liquids until __ 0645____ AM  DAY OF SURGERY  Water  Non-Citrus Juices (without pulp, NO RED-Apple, White grape, White cranberry) Black Coffee (NO MILK/CREAM OR CREAMERS, sugar ok)  Clear Tea (NO MILK/CREAM OR CREAMERS, sugar ok) regular and decaf                             Plain Jell-O (NO RED)                                           Fruit ices (not with fruit pulp, NO RED)                                     Popsicles (NO RED)                                                               Sports drinks like Gatorade (NO RED)                   The day of surgery:  Drink ONE (1) Pre-Surgery Clear Ensure or G2 at 0645 AM the morning of surgery. Drink in one sitting. Do not sip.  This drink was given to you during your hospital  pre-op appointment visit. Nothing else to drink after completing the  Pre-Surgery Clear Ensure or  G2.          If you have questions, please contact your surgeons office.       Oral Hygiene is also important to reduce your risk of infection.                                    Remember - BRUSH YOUR TEETH THE MORNING OF SURGERY WITH YOUR REGULAR TOOTHPASTE  DENTURES WILL BE REMOVED PRIOR TO SURGERY PLEASE DO NOT APPLY Poly grip  OR ADHESIVES!!!   Do NOT smoke after Midnight   Stop all vitamins and herbal supplements 7 days before surgery.   Take these medicines the morning of surgery with A SIP OF WATER :  inhalers as usual and bring, xanax  if needed, zyrtec , prozac , flonase    DO NOT TAKE ANY ORAL DIABETIC MEDICATIONS DAY OF YOUR SURGERY  Bring CPAP mask and tubing day of surgery.                              You may not have any metal on your body including hair pins, jewelry, and body piercing             Do not wear make-up, lotions, powders, perfumes/cologne, or deodorant  Do not wear nail polish including gel and S&S, artificial/acrylic nails, or any other type of covering on natural nails including finger and toenails. If you have artificial nails, gel coating, etc. that needs to be removed by a nail salon please have this removed prior to surgery or surgery may need to be canceled/ delayed if the surgeon/ anesthesia feels like they are unable to be safely monitored.   Do not shave  48 hours prior to surgery.               Men may shave face and neck.   Do not bring valuables to the hospital. [facility] IS NOT             RESPONSIBLE   FOR VALUABLES.   Contacts, glasses, dentures or bridgework may not be worn into surgery.   Bring small overnight bag day of surgery.   DO NOT BRING YOUR HOME MEDICATIONS TO THE HOSPITAL. PHARMACY WILL DISPENSE MEDICATIONS LISTED ON YOUR MEDICATION LIST TO YOU DURING YOUR ADMISSION IN THE HOSPITAL!    Patients discharged on the day of surgery will not be allowed to drive home.  Someone NEEDS to stay with you for the first 24 hours  after anesthesia.   Special Instructions: Bring a copy of your healthcare power of attorney and living will documents the day of surgery if you haven't scanned them before.              Please read over the following fact sheets you were given: IF YOU HAVE QUESTIONS ABOUT YOUR PRE-OP INSTRUCTIONS PLEASE CALL [phone].   If you received a COVID test during your pre-op visit  it is requested that you wear a mask when out in public, stay away from anyone that may not be feeling well and notify your surgeon if you develop symptoms. If you test positive for Covid or have been in contact with anyone that has tested positive in the last 10 days please notify you surgeon.      Pre-operative 4 CHG Bath Instructions   You can play a key role in reducing the risk of infection after surgery. Your skin needs to be as free of germs as possible. You can reduce the number of germs on your skin by washing with CHG (chlorhexidine  gluconate) soap before surgery. CHG is an antiseptic soap that kills germs and continues to kill germs even after washing.   DO NOT use if you have an allergy to chlorhexidine /CHG or antibacterial soaps. If your skin becomes reddened or irritated, stop using the CHG and notify one of our RNs at [phone].   Please shower with the CHG soap starting 4 days before surgery using the following  schedule:     Please keep in mind the following:  DO NOT shave, including legs and underarms, starting the day of your first shower.   You may shave your face at any point before/day of surgery.  Place clean sheets on your bed the day you start using CHG soap. Use a clean washcloth (not used since being washed) for each shower. DO NOT sleep with pets once you start using the CHG.   CHG Shower Instructions:  If you choose to wash your hair and private area, wash first with your normal shampoo/soap.  After you use shampoo/soap, rinse your hair and body thoroughly to remove shampoo/soap  residue.  Turn the water  OFF and apply about 3 tablespoons (45 ml) of CHG soap to a CLEAN washcloth.  Apply CHG soap ONLY FROM YOUR NECK DOWN TO YOUR TOES (washing for 3-5 minutes)  DO NOT use CHG soap on face, private areas, open wounds, or sores.  Pay special attention to the area where your surgery is being performed.  If you are having back surgery, having someone wash your back for you may be helpful. Wait 2 minutes after CHG soap is applied, then you may rinse off the CHG soap.  Pat dry with a clean towel  Put on clean clothes/pajamas   If you choose to wear lotion, please use ONLY the CHG-compatible lotions on the back of this paper.     Additional instructions for the day of surgery: DO NOT APPLY any lotions, deodorants, cologne, or perfumes.   Put on clean/comfortable clothes.  Brush your teeth.  Ask your nurse before applying any prescription medications to the skin.      CHG Compatible Lotions   Aveeno Moisturizing lotion  Cetaphil Moisturizing Cream  Cetaphil Moisturizing Lotion  Clairol Herbal Essence Moisturizing Lotion, Dry Skin  Clairol Herbal Essence Moisturizing Lotion, Extra Dry Skin  Clairol Herbal Essence Moisturizing Lotion, Normal Skin  Curel Age Defying Therapeutic Moisturizing Lotion with Alpha Hydroxy  Curel Extreme Care Body Lotion  Curel Soothing Hands Moisturizing Hand Lotion  Curel Therapeutic Moisturizing Cream, Fragrance-Free  Curel Therapeutic Moisturizing Lotion, Fragrance-Free  Curel Therapeutic Moisturizing Lotion, Original Formula  Eucerin Daily Replenishing Lotion  Eucerin Dry Skin Therapy Plus Alpha Hydroxy Crme  Eucerin Dry Skin Therapy Plus Alpha Hydroxy Lotion  Eucerin Original Crme  Eucerin Original Lotion  Eucerin Plus Crme Eucerin Plus Lotion  Eucerin TriLipid Replenishing Lotion  Keri Anti-Bacterial Hand Lotion  Keri Deep Conditioning Original Lotion Dry Skin Formula Softly Scented  Keri Deep Conditioning Original Lotion,  Fragrance Free Sensitive Skin Formula  Keri Lotion Fast Absorbing Fragrance Free Sensitive Skin Formula  Keri Lotion Fast Absorbing Softly Scented Dry Skin Formula  Keri Original Lotion  Keri Skin Renewal Lotion Keri Silky Smooth Lotion  Keri Silky Smooth Sensitive Skin Lotion  Nivea Body Creamy Conditioning Oil  Nivea Body Extra Enriched Teacher, Adult Education Moisturizing Lotion Nivea Crme  Nivea Skin Firming Lotion  NutraDerm 30 Skin Lotion  NutraDerm Skin Lotion  NutraDerm Therapeutic Skin Cream  NutraDerm Therapeutic Skin Lotion  ProShield Protective Hand Cream  Provon moisturizing lotion

## 2024-03-08 NOTE — Progress Notes (Addendum)
 Anesthesia Review:  PCP: Jamee Shanks Chase LVO 12/01/23  Cardiologist : none   PPM/ ICD: Device Orders: Rep Notified:  Chest x-ray : EKG : 12/01/2023  Echo : 2021  Stress test: Cardiac Cath :  CT cors- 2021   Activity level: can do a flight of stairs wthout difficutly  Sleep Study/ CPAP : none  Fasting Blood Sugar :      / Checks Blood Sugar -- times a day:    Blood Thinner/ Instructions /Last Dose: ASA / Instructions/ Last Dose :    Hx of Hypertension- has not been on meds in over a year per pt on preop of 03/13/24.  Preop med hx and instructons completed on 03/13/24.  Bag in lab with instructnos and soap and Ensure drink.     BMP hemolyzed at preop will need to be redrawn DOS.  Order placed in epic.

## 2024-03-12 ENCOUNTER — Other Ambulatory Visit: Payer: Self-pay | Admitting: Physician Assistant

## 2024-03-12 MED ORDER — DOCUSATE SODIUM 100 MG PO CAPS: 100.0000 mg | ORAL_CAPSULE | Freq: Every day | ORAL | 2 refills | Status: AC | PRN

## 2024-03-12 MED ORDER — ONDANSETRON HCL 4 MG PO TABS: 4.0000 mg | ORAL_TABLET | Freq: Three times a day (TID) | ORAL | 0 refills | Status: AC | PRN

## 2024-03-12 MED ORDER — METHOCARBAMOL 500 MG PO TABS: 500.0000 mg | ORAL_TABLET | Freq: Two times a day (BID) | ORAL | 2 refills | Status: AC | PRN

## 2024-03-12 MED ORDER — APIXABAN 2.5 MG PO TABS: ORAL_TABLET | ORAL | 0 refills | Status: AC

## 2024-03-12 MED ORDER — OXYCODONE-ACETAMINOPHEN 5-325 MG PO TABS: 1.0000 | ORAL_TABLET | Freq: Three times a day (TID) | ORAL | 0 refills | Status: AC | PRN

## 2024-03-13 ENCOUNTER — Encounter (HOSPITAL_COMMUNITY)
Admission: RE | Admit: 2024-03-13 | Discharge: 2024-03-13 | Disposition: A | Source: Ambulatory Visit | Attending: Orthopaedic Surgery | Admitting: Orthopaedic Surgery

## 2024-03-13 ENCOUNTER — Encounter (HOSPITAL_COMMUNITY): Payer: Self-pay | Admitting: Orthopaedic Surgery

## 2024-03-13 ENCOUNTER — Encounter (HOSPITAL_COMMUNITY): Payer: Self-pay

## 2024-03-13 ENCOUNTER — Other Ambulatory Visit: Payer: Self-pay

## 2024-03-13 DIAGNOSIS — M170 Bilateral primary osteoarthritis of knee: Secondary | ICD-10-CM | POA: Diagnosis not present

## 2024-03-13 DIAGNOSIS — Z01812 Encounter for preprocedural laboratory examination: Secondary | ICD-10-CM | POA: Diagnosis not present

## 2024-03-13 DIAGNOSIS — Z01818 Encounter for other preprocedural examination: Secondary | ICD-10-CM

## 2024-03-13 HISTORY — DX: Unspecified osteoarthritis, unspecified site: M19.90

## 2024-03-13 HISTORY — DX: Personal history of urinary calculi: Z87.442

## 2024-03-16 ENCOUNTER — Encounter (HOSPITAL_COMMUNITY)
Admission: RE | Admit: 2024-03-16 | Discharge: 2024-03-16 | Disposition: A | Source: Ambulatory Visit | Attending: Orthopaedic Surgery | Admitting: Orthopaedic Surgery

## 2024-03-16 DIAGNOSIS — M170 Bilateral primary osteoarthritis of knee: Secondary | ICD-10-CM | POA: Insufficient documentation

## 2024-03-16 DIAGNOSIS — Z01812 Encounter for preprocedural laboratory examination: Secondary | ICD-10-CM | POA: Insufficient documentation

## 2024-03-16 DIAGNOSIS — Z01818 Encounter for other preprocedural examination: Secondary | ICD-10-CM

## 2024-03-16 LAB — CBC
HCT: 41.5 % (ref 36.0–46.0)
Hemoglobin: 13 g/dL (ref 12.0–15.0)
MCH: 28.6 pg (ref 26.0–34.0)
MCHC: 31.3 g/dL (ref 30.0–36.0)
MCV: 91.4 fL (ref 80.0–100.0)
Platelets: 307 10*3/uL (ref 150–400)
RBC: 4.54 MIL/uL (ref 3.87–5.11)
RDW: 13.2 % (ref 11.5–15.5)
WBC: 6.1 10*3/uL (ref 4.0–10.5)
nRBC: 0 % (ref 0.0–0.2)

## 2024-03-16 LAB — SURGICAL PCR SCREEN
MRSA, PCR: NEGATIVE
Staphylococcus aureus: NEGATIVE

## 2024-03-20 DIAGNOSIS — M1712 Unilateral primary osteoarthritis, left knee: Secondary | ICD-10-CM | POA: Insufficient documentation

## 2024-03-23 ENCOUNTER — Encounter (HOSPITAL_COMMUNITY): Admission: RE | Disposition: A | Payer: Self-pay | Source: Ambulatory Visit | Attending: Orthopaedic Surgery

## 2024-03-23 ENCOUNTER — Ambulatory Visit (HOSPITAL_COMMUNITY): Payer: Self-pay | Admitting: Medical

## 2024-03-23 ENCOUNTER — Ambulatory Visit (HOSPITAL_COMMUNITY): Admission: RE | Admit: 2024-03-23 | Admitting: Orthopaedic Surgery

## 2024-03-23 ENCOUNTER — Ambulatory Visit (HOSPITAL_COMMUNITY): Admitting: Certified Registered Nurse Anesthetist

## 2024-03-23 ENCOUNTER — Ambulatory Visit (HOSPITAL_COMMUNITY)

## 2024-03-23 ENCOUNTER — Other Ambulatory Visit: Payer: Self-pay

## 2024-03-23 DIAGNOSIS — M170 Bilateral primary osteoarthritis of knee: Secondary | ICD-10-CM

## 2024-03-23 DIAGNOSIS — M1712 Unilateral primary osteoarthritis, left knee: Secondary | ICD-10-CM | POA: Insufficient documentation

## 2024-03-23 DIAGNOSIS — Z01818 Encounter for other preprocedural examination: Secondary | ICD-10-CM

## 2024-03-23 LAB — CBC
HCT: 40.3 % (ref 36.0–46.0)
Hemoglobin: 12.9 g/dL (ref 12.0–15.0)
MCH: 28.8 pg (ref 26.0–34.0)
MCHC: 32 g/dL (ref 30.0–36.0)
MCV: 90 fL (ref 80.0–100.0)
Platelets: 315 10*3/uL (ref 150–400)
RBC: 4.48 MIL/uL (ref 3.87–5.11)
RDW: 13.2 % (ref 11.5–15.5)
WBC: 6.3 10*3/uL (ref 4.0–10.5)
nRBC: 0 % (ref 0.0–0.2)

## 2024-03-23 LAB — BASIC METABOLIC PANEL WITH GFR
Anion gap: 13 (ref 5–15)
BUN: 12 mg/dL (ref 8–23)
CO2: 27 mmol/L (ref 22–32)
Calcium: 9.9 mg/dL (ref 8.9–10.3)
Chloride: 102 mmol/L (ref 98–111)
Creatinine, Ser: 0.72 mg/dL (ref 0.44–1.00)
GFR, Estimated: >60 mL/min
Glucose, Bld: 79 mg/dL (ref 70–99)
Potassium: 3.4 mmol/L — ABNORMAL LOW (ref 3.5–5.1)
Sodium: 141 mmol/L (ref 135–145)

## 2024-03-23 MED ORDER — ONDANSETRON HCL 4 MG/2ML IJ SOLN
INTRAMUSCULAR | Status: DC | PRN
  Administered 2024-03-23: 4 mg via INTRAVENOUS

## 2024-03-23 MED ORDER — OXYCODONE HCL 5 MG PO TABS
ORAL_TABLET | ORAL | Status: AC
  Filled 2024-03-23: qty 1

## 2024-03-23 MED ORDER — LACTATED RINGERS IV SOLN: INTRAVENOUS | Status: DC

## 2024-03-23 MED ORDER — TRANEXAMIC ACID 1000 MG/10ML IV SOLN
2000.0000 mg | INTRAVENOUS | Status: DC
  Filled 2024-03-23: qty 20

## 2024-03-23 MED ORDER — OXYCODONE HCL 5 MG PO TABS
5.0000 mg | ORAL_TABLET | Freq: Four times a day (QID) | ORAL | Status: DC | PRN
  Administered 2024-03-23: 5 mg via ORAL

## 2024-03-23 MED ORDER — PHENYLEPHRINE HCL-NACL 20-0.9 MG/250ML-% IV SOLN
INTRAVENOUS | Status: DC | PRN
  Administered 2024-03-23: 35 ug/min via INTRAVENOUS

## 2024-03-23 MED ORDER — PROPOFOL 10 MG/ML IV BOLUS
INTRAVENOUS | Status: DC | PRN
  Administered 2024-03-23: 10 mg via INTRAVENOUS
  Administered 2024-03-23: 20 mg via INTRAVENOUS

## 2024-03-23 MED ORDER — ONDANSETRON HCL 4 MG/2ML IJ SOLN
INTRAMUSCULAR | Status: AC
  Filled 2024-03-23: qty 2

## 2024-03-23 MED ORDER — DROPERIDOL 2.5 MG/ML IJ SOLN: 0.6250 mg | Freq: Once | INTRAMUSCULAR | Status: DC | PRN

## 2024-03-23 MED ORDER — VANCOMYCIN HCL 1000 MG IV SOLR
INTRAVENOUS | Status: AC
  Filled 2024-03-23: qty 20

## 2024-03-23 MED ORDER — MEPIVACAINE HCL (PF) 2 % IJ SOLN
INTRAMUSCULAR | Status: DC | PRN
  Administered 2024-03-23: 3 mL via INTRATHECAL

## 2024-03-23 MED ORDER — ISOPROPYL ALCOHOL 70 % SOLN
Status: DC | PRN
  Administered 2024-03-23: 1 via TOPICAL

## 2024-03-23 MED ORDER — ACETAMINOPHEN 10 MG/ML IV SOLN
1000.0000 mg | Freq: Once | INTRAVENOUS | Status: DC | PRN
  Administered 2024-03-23: 1000 mg via INTRAVENOUS

## 2024-03-23 MED ORDER — FENTANYL CITRATE (PF) 50 MCG/ML IJ SOSY
50.0000 ug | PREFILLED_SYRINGE | INTRAMUSCULAR | Status: DC
  Administered 2024-03-23: 50 ug via INTRAVENOUS
  Filled 2024-03-23 (×2): qty 2

## 2024-03-23 MED ORDER — ACETAMINOPHEN 10 MG/ML IV SOLN
INTRAVENOUS | Status: AC
  Filled 2024-03-23: qty 100

## 2024-03-23 MED ORDER — HYDROMORPHONE HCL 1 MG/ML IJ SOLN
0.2500 mg | INTRAMUSCULAR | Status: DC | PRN
  Administered 2024-03-23: 0.5 mg via INTRAVENOUS

## 2024-03-23 MED ORDER — STERILE WATER FOR IRRIGATION IR SOLN
Status: DC | PRN
  Administered 2024-03-23: 2000 mL

## 2024-03-23 MED ORDER — 0.9 % SODIUM CHLORIDE (POUR BTL) OPTIME
TOPICAL | Status: DC | PRN
  Administered 2024-03-23: 1000 mL

## 2024-03-23 MED ORDER — OXYCODONE HCL 5 MG PO TABS
5.0000 mg | ORAL_TABLET | Freq: Once | ORAL | Status: AC | PRN
  Administered 2024-03-23: 5 mg via ORAL

## 2024-03-23 MED ORDER — BUPIVACAINE-MELOXICAM ER 200-6 MG/7ML IJ SOLN
INTRAMUSCULAR | Status: DC | PRN
  Administered 2024-03-23: 400 mg

## 2024-03-23 MED ORDER — OXYCODONE HCL 5 MG/5ML PO SOLN: 5.0000 mg | Freq: Once | ORAL | Status: AC | PRN

## 2024-03-23 MED ORDER — PROPOFOL 500 MG/50ML IV EMUL
INTRAVENOUS | Status: DC | PRN
  Administered 2024-03-23: 100 ug/kg/min via INTRAVENOUS

## 2024-03-23 MED ORDER — LACTATED RINGERS IV BOLUS: 500.0000 mL | Freq: Once | INTRAVENOUS | Status: DC

## 2024-03-23 MED ORDER — CEFAZOLIN SODIUM-DEXTROSE 2-4 GM/100ML-% IV SOLN: 2.0000 g | Freq: Four times a day (QID) | INTRAVENOUS | Status: DC

## 2024-03-23 MED ORDER — ORAL CARE MOUTH RINSE: 15.0000 mL | Freq: Once | OROMUCOSAL | Status: AC

## 2024-03-23 MED ORDER — PROPOFOL 1000 MG/100ML IV EMUL
INTRAVENOUS | Status: AC
  Filled 2024-03-23: qty 100

## 2024-03-23 MED ORDER — POVIDONE-IODINE 10 % EX SWAB: 2.0000 | Freq: Once | CUTANEOUS | Status: DC

## 2024-03-23 MED ORDER — HYDROMORPHONE HCL 1 MG/ML IJ SOLN
INTRAMUSCULAR | Status: AC
  Filled 2024-03-23: qty 1

## 2024-03-23 MED ORDER — MEPERIDINE HCL 25 MG/ML IJ SOLN: 6.2500 mg | INTRAMUSCULAR | Status: DC | PRN

## 2024-03-23 MED ORDER — METHOCARBAMOL 500 MG PO TABS
500.0000 mg | ORAL_TABLET | Freq: Four times a day (QID) | ORAL | Status: DC | PRN
  Administered 2024-03-23: 500 mg via ORAL

## 2024-03-23 MED ORDER — BUPIVACAINE-MELOXICAM ER 400-12 MG/14ML IJ SOLN
INTRAMUSCULAR | Status: AC
  Filled 2024-03-23: qty 1

## 2024-03-23 MED ORDER — ROPIVACAINE HCL 5 MG/ML IJ SOLN
INTRAMUSCULAR | Status: DC | PRN
  Administered 2024-03-23: 20 mL via PERINEURAL

## 2024-03-23 MED ORDER — MIDAZOLAM HCL (PF) 2 MG/2ML IJ SOLN
1.0000 mg | INTRAMUSCULAR | Status: DC
  Administered 2024-03-23: 2 mg via INTRAVENOUS
  Filled 2024-03-23 (×2): qty 2

## 2024-03-23 MED ORDER — TRANEXAMIC ACID 1000 MG/10ML IV SOLN
INTRAVENOUS | Status: DC | PRN
  Administered 2024-03-23: 2000 mg via TOPICAL

## 2024-03-23 MED ORDER — METHOCARBAMOL 500 MG PO TABS
ORAL_TABLET | ORAL | Status: AC
  Filled 2024-03-23: qty 1

## 2024-03-23 MED ORDER — CEFAZOLIN SODIUM-DEXTROSE 2-4 GM/100ML-% IV SOLN
2.0000 g | INTRAVENOUS | Status: AC
  Administered 2024-03-23: 2 g via INTRAVENOUS
  Filled 2024-03-23: qty 100

## 2024-03-23 MED ORDER — TRANEXAMIC ACID-NACL 1000-0.7 MG/100ML-% IV SOLN
1000.0000 mg | INTRAVENOUS | Status: AC
  Administered 2024-03-23: 1000 mg via INTRAVENOUS
  Filled 2024-03-23: qty 100

## 2024-03-23 MED ORDER — CHLORHEXIDINE GLUCONATE 0.12 % MT SOLN
15.0000 mL | Freq: Once | OROMUCOSAL | Status: AC
  Administered 2024-03-23: 15 mL via OROMUCOSAL

## 2024-03-23 MED ORDER — TRANEXAMIC ACID-NACL 1000-0.7 MG/100ML-% IV SOLN: 1000.0000 mg | Freq: Once | INTRAVENOUS | Status: DC

## 2024-03-23 MED ORDER — METHOCARBAMOL 1000 MG/10ML IJ SOLN: 500.0000 mg | Freq: Four times a day (QID) | INTRAMUSCULAR | Status: DC | PRN

## 2024-03-23 MED ORDER — VANCOMYCIN HCL IN DEXTROSE 1-5 GM/200ML-% IV SOLN
INTRAVENOUS | Status: AC
  Filled 2024-03-23: qty 200

## 2024-03-23 MED ORDER — SODIUM CHLORIDE 0.9 % IR SOLN
Status: DC | PRN
  Administered 2024-03-23: 1000 mL

## 2024-03-23 MED ORDER — VANCOMYCIN HCL 1 G IV SOLR
INTRAVENOUS | Status: DC | PRN
  Administered 2024-03-23: 1000 mg via TOPICAL

## 2024-03-23 MED ORDER — OXYCODONE HCL 5 MG PO TABS: 10.0000 mg | ORAL_TABLET | Freq: Four times a day (QID) | ORAL | Status: DC | PRN

## 2024-03-23 NOTE — Discharge Instructions (Signed)

## 2024-03-23 NOTE — Transfer of Care (Signed)
 Immediate Anesthesia Transfer of Care Note  Patient: Anna Jackson  Procedure(s) Performed: ARTHROPLASTY, KNEE, TOTAL (Left: Knee)  Patient Location: PACU  Anesthesia Type:Spinal  Level of Consciousness: awake, alert , and oriented  Airway & Oxygen Therapy: Patient Spontanous Breathing and Patient connected to face mask oxygen  Post-op Assessment: Report given to RN and Post -op Vital signs reviewed and stable  Post vital signs: Reviewed and stable  Last Vitals:  Vitals Value Taken Time  BP 123/75 03/23/24 12:08  Temp    Pulse 68 03/23/24 12:09  Resp 14 03/23/24 12:09  SpO2 97 % 03/23/24 12:09  Vitals shown include unfiled device data.  Last Pain:  Vitals:   03/23/24 0746  TempSrc: Oral  PainSc:          Complications: No notable events documented.

## 2024-03-23 NOTE — Anesthesia Postprocedure Evaluation (Signed)
"   Anesthesia Post Note  Patient: Stephane JAYSON Breed  Procedure(s) Performed: ARTHROPLASTY, KNEE, TOTAL (Left: Knee)     Patient location during evaluation: PACU Anesthesia Type: Spinal Level of consciousness: oriented and awake and alert Pain management: pain level controlled Vital Signs Assessment: post-procedure vital signs reviewed and stable Respiratory status: spontaneous breathing, respiratory function stable and patient connected to nasal cannula oxygen Cardiovascular status: blood pressure returned to baseline and stable Postop Assessment: no headache, no backache, no apparent nausea or vomiting and spinal receding Anesthetic complications: no   No notable events documented.  Last Vitals:  Vitals:   03/23/24 1345 03/23/24 1403  BP: (!) 143/76 130/79  Pulse: (!) 51 (!) 54  Resp: 13 16  Temp:    SpO2: 93% 98%    Last Pain:  Vitals:   03/23/24 1510  TempSrc:   PainSc: 6                  Franky JONETTA Bald      "

## 2024-03-23 NOTE — Anesthesia Procedure Notes (Signed)
 Procedure Name: MAC Date/Time: 03/23/2024 9:56 AM  Performed by: Zulema Leita PARAS, CRNAPre-anesthesia Checklist: Patient identified, Emergency Drugs available, Suction available and Patient being monitored Oxygen Delivery Method: Simple face mask

## 2024-03-23 NOTE — Anesthesia Procedure Notes (Signed)
 Anesthesia Regional Block: Adductor canal block   Pre-Anesthetic Checklist: , timeout performed,  Correct Patient, Correct Site, Correct Laterality,  Correct Procedure, Correct Position, site marked,  Risks and benefits discussed,  Surgical consent,  Pre-op evaluation,  At surgeon's request and post-op pain management  Laterality: Left  Prep: chloraprep       Needles:  Injection technique: Single-shot  Needle Type: Echogenic Stimulator Needle     Needle Length: 9cm  Needle Gauge: 21     Additional Needles:   Procedures:,,,, ultrasound used (permanent image in chart),,    Narrative:  Start time: 03/23/2024 9:20 AM End time: 03/23/2024 9:25 AM Injection made incrementally with aspirations every 5 mL.  Performed by: Personally  Anesthesiologist: Tilford Franky BIRCH, MD  Additional Notes: Discussed risks and benefits of the nerve block in detail, including but not limited vascular injury, permanent nerve damage and infection.   Patient tolerated the procedure well. Local anesthetic introduced in an incremental fashion under minimal resistance after negative aspirations. No paresthesias were elicited. After completion of the procedure, no acute issues were identified and patient continued to be monitored by RN.

## 2024-03-23 NOTE — Anesthesia Preprocedure Evaluation (Addendum)
 "                                  Anesthesia Evaluation  Patient identified by MRN, date of birth, ID band Patient awake    Reviewed: Allergy & Precautions, NPO status , Patient's Chart, lab work & pertinent test results  Airway Mallampati: II  TM Distance: >3 FB Neck ROM: Full    Dental  (+) Teeth Intact, Dental Advisory Given, Missing   Pulmonary asthma    breath sounds clear to auscultation       Cardiovascular hypertension, + angina  + DOE   Rhythm:Regular Rate:Normal     Neuro/Psych  Headaches PSYCHIATRIC DISORDERS Anxiety Depression     Neuromuscular disease    GI/Hepatic Neg liver ROS, hiatal hernia,GERD  ,,  Endo/Other  negative endocrine ROS    Renal/GU Renal disease     Musculoskeletal  (+) Arthritis ,    Abdominal   Peds  Hematology negative hematology ROS (+)   Anesthesia Other Findings   Reproductive/Obstetrics                              Anesthesia Physical Anesthesia Plan  ASA: 3  Anesthesia Plan: Spinal   Post-op Pain Management: Regional block*   Induction: Intravenous  PONV Risk Score and Plan: 3 and Ondansetron , Dexamethasone , Midazolam  and Propofol  infusion  Airway Management Planned: Natural Airway and Simple Face Mask  Additional Equipment: None  Intra-op Plan:   Post-operative Plan:   Informed Consent: I have reviewed the patients History and Physical, chart, labs and discussed the procedure including the risks, benefits and alternatives for the proposed anesthesia with the patient or authorized representative who has indicated his/her understanding and acceptance.       Plan Discussed with: CRNA  Anesthesia Plan Comments: (Lab Results      Component                Value               Date                      WBC                      6.1                 03/16/2024                HGB                      13.0                03/16/2024                HCT                       41.5                03/16/2024                MCV                      91.4                03/16/2024  PLT                      307                 03/16/2024           )         Anesthesia Quick Evaluation  "

## 2024-03-23 NOTE — Evaluation (Signed)
 Physical Therapy Evaluation Patient Details Name: Anna Jackson MRN: [id number] DOB: Dec 23, 1960 Today's Date: 03/23/2024  History of Present Illness  64 yo female presents to therapy s/p L TKA on 03/23/2024 due to failure of conservative measures. Pt PMH includes but is not limited to: cholecystectomy, hernia, R wrist fx s/p ORIF, adenopathy, anxiety, arthritis, depression, kidney stones, HLD, HTN, non hodgkin's lymphoma, and neuropathic pain.  Clinical Impression    Anna Jackson is a 64 y.o. female POD 0 s/p L TKA. Patient reports IND with mobility at baseline. Patient is now limited by functional impairments (see PT problem list below) and requires CGA for transfers and gait with RW. Patient was able to ambulate 55 feet x 2 with RW and CGA and cues for safe walker management. Patient educated on safe sequencing for stair mobility, fall risk prevention, use of RW, CP/ice, pain management and goal, L LE positioning, and car transfers pt and spouse verbalized understanding of safe guarding position for people assisting with mobility. Patient instructed in exercises to facilitate ROM and circulation reviewed and HO provided. Patient will benefit from continued skilled PT interventions to address impairments and progress towards PLOF. Patient has met mobility goals at adequate level for discharge home with family support and [facility] services; will continue to follow if pt continues acute stay to progress towards Mod I goals.       If plan is discharge home, recommend the following: A little help with walking and/or transfers;A little help with bathing/dressing/bathroom;Assistance with cooking/housework;Assist for transportation;Help with stairs or ramp for entrance   Can travel by private vehicle        Equipment Recommendations Rolling walker (2 wheels)  Recommendations for Other Services       Functional Status Assessment Patient has had a recent decline in their functional status and  demonstrates the ability to make significant improvements in function in a reasonable and predictable amount of time.     Precautions / Restrictions Precautions Precautions: Fall;Knee Restrictions Weight Bearing Restrictions Per Provider Order: No      Mobility  Bed Mobility Overal bed mobility: Needs Assistance Bed Mobility: Supine to Sit     Supine to sit: Contact guard, HOB elevated     General bed mobility comments: min cues    Transfers Overall transfer level: Needs assistance Equipment used: Rolling walker (2 wheels) Transfers: Sit to/from Stand Sit to Stand: Contact guard assist           General transfer comment: min cues for safety, RW and UE placement for bed, recliner and commode transfers    Ambulation/Gait Ambulation/Gait assistance: Contact guard assist Gait Distance (Feet): 55 Feet Assistive device: Rolling walker (2 wheels) Gait Pattern/deviations: Step-to pattern, Decreased stance time - left, Antalgic, Trunk flexed Gait velocity: decreased     General Gait Details: B UE support at RW with slight trunk flexion to offload L LE in stance phase, min cues for safety, RW management  Stairs Stairs: Yes Stairs assistance: Contact guard assist Stair Management: Two rails Number of Stairs: 3 General stair comments: step navigation with B handrails, cues for safety, sequencing and step to pattern  Wheelchair Mobility     Tilt Bed    Modified Rankin (Stroke Patients Only)       Balance Overall balance assessment: Needs assistance Sitting-balance support: Feet supported Sitting balance-Leahy Scale: Good     Standing balance support: Bilateral upper extremity supported, During functional activity, Reliant on assistive device for balance Standing balance-Leahy Scale: Fair Standing  balance comment: static standing no UE support and no overt LOB                             Pertinent Vitals/Pain Pain Assessment Pain Assessment:  0-10 Pain Score: 5  Pain Location: L knee and LE Pain Descriptors / Indicators: Aching, Constant, Discomfort, Dull, Grimacing, Operative site guarding Pain Intervention(s): Limited activity within patient's tolerance, Monitored during session, Premedicated before session, Repositioned, Ice applied    Home Living Family/patient expects to be discharged to:: Private residence Living Arrangements: Spouse/significant other Available Help at Discharge: Family Type of Home: House Home Access: Stairs to enter Entrance Stairs-Rails: Right;Left;Can reach both Secretary/administrator of Steps: 3   Home Layout: One level Home Equipment: Production Assistant, Radio - single point      Prior Function Prior Level of Function : Independent/Modified Independent;Driving             Mobility Comments: intermittent use of SPC, IND for all ADLs self care tasks and IADLs       Extremity/Trunk Assessment        Lower Extremity Assessment Lower Extremity Assessment: LLE deficits/detail LLE Deficits / Details: ankle DF/PF 5/5: SLR < 10 degree lag LLE Sensation: WNL    Cervical / Trunk Assessment Cervical / Trunk Assessment: Normal  Communication   Communication Communication: No apparent difficulties    Cognition Arousal: Alert Behavior During Therapy: WFL for tasks assessed/performed   PT - Cognitive impairments: No apparent impairments                         Following commands: Intact       Cueing       General Comments      Exercises Total Joint Exercises Ankle Circles/Pumps: AROM, Both, 10 reps Quad Sets: AROM, Left, 5 reps Short Arc Quad: AROM, Left, 5 reps Heel Slides: AROM, Left, 5 reps Hip ABduction/ADduction: AROM, Left, 5 reps Straight Leg Raises: AROM, Left, 5 reps Knee Flexion: AROM, Left, 5 reps, Seated   Assessment/Plan    PT Assessment Patient needs continued PT services  PT Problem List Decreased strength;Decreased range of motion;Decreased activity  tolerance;Decreased balance;Decreased mobility;Decreased coordination;Pain       PT Treatment Interventions DME instruction;Gait training;Stair training;Functional mobility training;Therapeutic exercise;Therapeutic activities;Balance training;Neuromuscular re-education;Patient/family education;Modalities    PT Goals (Current goals can be found in the Care Plan section)  Acute Rehab PT Goals Patient Stated Goal: go to the gym, walk through [employer] no pain PT Goal Formulation: With patient Time For Goal Achievement: 04/06/24 Potential to Achieve Goals: Good    Frequency 7X/week     Co-evaluation               AM-PAC PT 6 Clicks Mobility  Outcome Measure Help needed turning from your back to your side while in a flat bed without using bedrails?: None Help needed moving from lying on your back to sitting on the side of a flat bed without using bedrails?: A Little Help needed moving to and from a bed to a chair (including a wheelchair)?: A Little Help needed standing up from a chair using your arms (e.g., wheelchair or bedside chair)?: A Little Help needed to walk in hospital room?: A Little Help needed climbing 3-5 steps with a railing? : A Little 6 Click Score: 19    End of Session Equipment Utilized During Treatment: Gait belt Activity Tolerance: Patient tolerated treatment well;No  increased pain Patient left: in chair;with call bell/phone within reach;with family/visitor present Nurse Communication: Mobility status;Other (comment) (pt readiness for same day d/c from PT standpoint) PT Visit Diagnosis: Unsteadiness on feet (R26.81);Other abnormalities of gait and mobility (R26.89);Muscle weakness (generalized) (M62.81);Difficulty in walking, not elsewhere classified (R26.2);Pain Pain - Right/Left: Left Pain - part of body: Knee;Leg    Time: 8541-8451 PT Time Calculation (min) (ACUTE ONLY): 50 min   Charges:   PT Evaluation $PT Eval Low Complexity: 1 Low PT  Treatments $Gait Training: 8-22 mins $Therapeutic Exercise: 8-22 mins PT General Charges $$ ACUTE PT VISIT: 1 Visit         [place], PT Acute Rehab   [place] VEAR Drone 03/23/2024, 4:55 PM

## 2024-03-23 NOTE — H&P (Signed)
 "   PREOPERATIVE H&P  Chief Complaint: left knee osteoarthritis  HPI: Anna Jackson is a 64 y.o. female who presents for surgical treatment of left knee osteoarthritis.  She denies any changes in medical history.  Past Surgical History:  Procedure Laterality Date   AXILLARY LYMPH NODE DISSECTION Left 03/09/2013   Procedure: EXCISION LEFT AXILLARY LYMPH NODES;  Surgeon: Elon CHRISTELLA Pacini, MD;  Location: [facility];  Service: General;  Laterality: Left;   BREAST BIOPSY Left 02/13/2013   high risk   BREAST EXCISIONAL BIOPSY Left 03/09/2013   high risk   CERVICAL CONIZATION W/BX     CHOLECYSTECTOMY  1995   Open, done with Hiatal hernia repair   ESOPHAGEAL MANOMETRY N/A 01/12/2017   Procedure: ESOPHAGEAL MANOMETRY (EM);  Surgeon: Legrand Victory LITTIE DOUGLAS, MD;  Location: [facility];  Service: Gastroenterology;  Laterality: N/A;   EXTRACORPOREAL SHOCK WAVE LITHOTRIPSY Right 06/20/2023   Procedure: LITHOTRIPSY, ESWL;  Surgeon: Roseann Adine PARAS., MD;  Location: [facility];  Service: Urology;  Laterality: Right;   INCISIONAL HERNIA REPAIR  2004   Dr Georgette Poli.  Parietex 20x15cm mesh   KNEE SURGERY Right 02/2010   Dr Amy, Arthroscopic   NISSEN FUNDOPLICATION  1995   OPEN, Dr Velinda Moats   ORIF WRIST FRACTURE Right 12/25/2019   Procedure: #1 right wrist/radius open reduction internal fixation comminuted complex distal radius fracture with DVR Biomet cross lock plate and screw construct.  This was a greater than 3 part intra-articular fracture.  #2 AP lateral and oblique x-rays performed examined and interpreted by myself #3 evaluation anesthesia ulnar styloid fracture with subsequent closed treatment based upon stability    PORTACATH PLACEMENT N/A 03/23/2013   Procedure: INSERTION PORT-A-CATH;  Surgeon: Elon CHRISTELLA Pacini, MD;  Location: [facility] OR;  Service: General;  Laterality: N/A;   portacath removal     TUBAL LIGATION     VAGINAL HYSTERECTOMY  2002   Dr Art Manda   Social History    Socioeconomic History   Marital status: Significant Other    Spouse name: Not on file   Number of children: 1   Years of education: Not on file   Highest education level: GED or equivalent  Occupational History   Occupation: RECEIVING Marine Scientist: [employer]   Occupation: Armed Forces Operational Officer  Tobacco Use   Smoking status: Never   Smokeless tobacco: Never  Vaping Use   Vaping status: Never Used  Substance and Sexual Activity   Alcohol  use: Never   Drug use: No   Sexual activity: Not Currently    Partners: Male  Other Topics Concern   Not on file  Social History Narrative   Exercise-- walking   Social Drivers of Health   Tobacco Use: Low Risk (03/13/2024)   Patient History    Smoking Tobacco Use: Never    Smokeless Tobacco Use: Never    Passive Exposure: Not on file  Financial Resource Strain: Low Risk (12/01/2023)   Overall Financial Resource Strain (CARDIA)    Difficulty of Paying Living Expenses: Not very hard  Food Insecurity: Food Insecurity Present (12/01/2023)   Epic    Worried About Programme Researcher, Broadcasting/film/video in the Last Year: Sometimes true    Ran Out of Food in the Last Year: Patient declined  Transportation Needs: Patient Declined (12/01/2023)   Epic    Lack of Transportation (Medical): Patient declined    Lack of Transportation (Non-Medical): Patient declined  Physical Activity: Inactive (12/01/2023)   Exercise  Vital Sign    Days of Exercise per Week: 0 days    Minutes of Exercise per Session: Not on file  Stress: No Stress Concern Present (12/01/2023)   [employer] of Occupational Health - Occupational Stress Questionnaire    Feeling of Stress: Only a little  Social Connections: Moderately Isolated (12/01/2023)   Social Connection and Isolation Panel    Frequency of Communication with Friends and Family: More than three times a week    Frequency of Social Gatherings with Friends and Family: More than three times a week    Attends Religious  Services: 1 to 4 times per year    Active Member of [employer] or Organizations: No    Attends Banker Meetings: Not on file    Marital Status: Widowed  Depression (PHQ2-9): Low Risk (10/26/2023)   Depression (PHQ2-9)    PHQ-2 Score: 4  Alcohol  Screen: Not on file  Housing: Low Risk (12/01/2023)   Epic    Unable to Pay for Housing in the Last Year: No    Number of Times Moved in the Last Year: 0    Homeless in the Last Year: No  Utilities: Not At Risk (06/22/2023)   AHC Utilities    Threatened with loss of utilities: No  Health Literacy: Not on file   Family History  Problem Relation Age of Onset   Heart disease Mother    High blood pressure Mother    High Cholesterol Mother    Anxiety disorder Mother    Heart disease Father    High Cholesterol Father    High blood pressure Father    Heart disease Brother    Alcoholism Brother    Breast cancer Maternal Grandmother 48   Cancer Maternal Grandmother 106       breast   Diabetes Cousin    Coronary artery disease Other        with hernia repair   Migraines Other    Hypertension Other    Colon cancer Neg Hx    Allergies[1] Prior to Admission medications  Medication Sig Start Date End Date Taking? Authorizing Provider  alendronate  (FOSAMAX ) 70 MG tablet TAKE 1 TABLET BY MOUTH ONCE A WEEK WITH A FULL GLASS OF WATER  ON AN EMPTY STOMACH 03/02/24  Yes Antonio Cyndee Jamee JONELLE, DO  apixaban  (ELIQUIS ) 2.5 MG TABS tablet Take one tab po bid x 30 days after surgery to prevent blood clots 03/12/24   Jule Ronal CROME, PA-C  atorvastatin  (LIPITOR) 20 MG tablet Take 1 tablet (20 mg total) by mouth daily. Pt needs office visit for further refills 05/23/23  Yes Lowne Cyndee Jamee R, DO  cetirizine  (ZYRTEC ) 10 MG tablet Take 1 tablet (10 mg total) by mouth daily. 10/26/23  Yes Wheeler Harlene CROME, NP  cyclobenzaprine  (FLEXERIL ) 5 MG tablet TAKE 1 TABLET BY MOUTH AT BEDTIME 02/21/23  Yes Barbarann Oneil BROCKS, MD  docusate sodium  (COLACE) 100 MG capsule  Take 300 mg by mouth daily as needed for mild constipation or moderate constipation.   Yes [provider]  docusate sodium  (COLACE) 100 MG capsule Take 1 capsule (100 mg total) by mouth daily as needed. 03/12/24 03/12/25 Yes Jule Ronal CROME, PA-C  FLUoxetine  (PROZAC ) 40 MG capsule Take 1 capsule (40 mg total) by mouth daily. 09/23/23  Yes Antonio Cyndee Jamee R, DO  fluticasone  (FLONASE ) 50 MCG/ACT nasal spray Place 2 sprays into both nostrils daily. 10/26/23  Yes Yacopino, Jessica L, NP  methocarbamol  (ROBAXIN ) 500 MG tablet  Take 1 tablet (500 mg total) by mouth 2 (two) times daily as needed. 03/12/24  Yes Jule Ronal CROME, PA-C  ondansetron  (ZOFRAN ) 4 MG tablet Take 1 tablet (4 mg total) by mouth every 8 (eight) hours as needed for nausea or vomiting. 03/12/24   Jule Ronal CROME, PA-C  oxyCODONE -acetaminophen  (PERCOCET) 5-325 MG tablet Take 1-2 tablets by mouth every 8 (eight) hours as needed. To be taken after surgery 03/12/24   Jule Ronal CROME, PA-C  Vitamin D , Ergocalciferol , (DRISDOL ) 1.25 MG (50000 UNIT) CAPS capsule Take 1 capsule by mouth once a week 04/15/23  Yes Antonio Cyndee Jamee JONELLE, DO  albuterol  (VENTOLIN  HFA) 108 (90 Base) MCG/ACT inhaler Inhale 1-2 puffs into the lungs every 4 (four) hours as needed for wheezing or shortness of breath. 10/26/23   Wheeler Harlene CROME, NP  ALPRAZolam  (XANAX ) 0.25 MG tablet TAKE 1 TABLET BY MOUTH THREE TIMES DAILY AS NEEDED FOR ANXIETY 01/09/24   Antonio Cyndee Jamee R, DO     Positive ROS: All other systems have been reviewed and were otherwise negative with the exception of those mentioned in the HPI and as above.  Physical Exam: General: Alert, no acute distress Cardiovascular: No pedal edema Respiratory: No cyanosis, no use of accessory musculature GI: abdomen soft Skin: No lesions in the area of chief complaint Neurologic: Sensation intact distally Psychiatric: Patient is competent for consent with normal mood and affect Lymphatic: no  lymphedema  MUSCULOSKELETAL: exam stable  Assessment: left knee osteoarthritis  Plan: Plan for Procedures: ARTHROPLASTY, KNEE, TOTAL  The risks benefits and alternatives were discussed with the patient including but not limited to the risks of nonoperative treatment, versus surgical intervention including infection, bleeding, nerve injury,  blood clots, cardiopulmonary complications, morbidity, mortality, among others, and they were willing to proceed.   Ozell Cummins, MD 03/23/2024 9:10 AM     [1]  Allergies Allergen Reactions   Betadine  [Povidone Iodine ] Hives and Itching    Pt states on 03/13/24 that when she had port that betadine  used at that time made her itch but she has had betadine  used since that time with no problems    Codeine Nausea And Vomiting   "

## 2024-03-23 NOTE — Progress Notes (Signed)
 Orthopedic Tech Progress Note Patient Details:  Anna Jackson May 11, 1960 [id number]  Ortho Devices Type of Ortho Device: Bone foam zero knee Ortho Device/Splint Location: LLE Ortho Device/Splint Interventions: Application   Post Interventions Patient Tolerated: Well  Massie BRAVO [name] 03/23/2024, 12:37 PM

## 2024-03-23 NOTE — Op Note (Signed)
 "  Total Knee Arthroplasty Procedure Note  Preoperative diagnosis: Left knee osteoarthritis  Postoperative diagnosis:same  Operative findings: Complete loss articular cartilage from all 3 compartments Osteopenia  Operative procedure: Left total knee arthroplasty. CPT [phone]  Surgeon: N. Ozell Cummins, MD  Assist: Ronal Morna Grave, PA-C; necessary for the timely completion of procedure and due to complexity of procedure.  Anesthesia: Spinal, regional, local  Tourniquet time: see anesthesia record  Implants: Zimmer persona Femur: CR 8 Tibia: E with 30 mm cemented stem Patella: 29 mm Polyethylene: 11 mm medial congruent  Indication: Anna Jackson is a 64 y.o. year old female with a history of knee pain. Having failed conservative management, the patient elected to proceed with a total knee arthroplasty.  We have reviewed the risk and benefits of the surgery and they elected to proceed after voicing understanding.  Procedure:  After informed consent was obtained and understanding of the risk were voiced including but not limited to bleeding, infection, damage to surrounding structures including nerves and vessels, blood clots, leg length inequality and the failure to achieve desired results, the operative extremity was marked with verbal confirmation of the patient in the holding area.   The patient was then brought to the operating room and transported to the operating room table in the supine position.  A tourniquet was applied to the operative extremity around the upper thigh. The operative limb was then prepped and draped in the usual sterile fashion and preoperative antibiotics were administered.  A time out was performed prior to the start of surgery confirming the correct extremity, preoperative antibiotic administration, as well as team members, implants and instruments available for the case. Correct surgical site was also confirmed with preoperative radiographs. The  limb was then elevated for exsanguination and the tourniquet was inflated. A midline incision was made and a standard medial parapatellar approach was performed.  The infrapatellar fat pad was removed.  Suprapatellar synovium was removed to reveal the anterior distal femoral cortex.  A medial peel was performed to release the capsule and the deep MCL off of the medial tibial plateau back to the semimembranosus.  The patella was then everted which showed complete loss of articular cartilage and was resected down to 14 mm and sized to a 29 mm.  A cover was placed on the patella for protection from retractors.  The knee was then brought into flexion and we then turned our attention to the femur.  The ACL was sacrificed.  Start site was drilled in the femur and the intramedullary distal femoral cutting guide was placed, set at 5 degrees valgus, taking 10 mm of distal resection. The distal cut was made. Osteophytes were then removed.  Next, the proximal tibial cutting guide was placed with appropriate slope, varus/valgus alignment and depth of resection.  The drop rod was attached to confirm that it was aimed at the second metatarsal.  The proximal tibial cut was made taking 2 mm off the lower medial side. Gap blocks were then used to assess the extension gap and alignment, and appropriate soft tissue releases were performed. Attention was turned back to the femur, which was sized using the sizing guide to a size 8 standard. Appropriate rotation of the femoral component was determined using epicondylar axis, Whitesides line, and assessing the flexion gap under ligament tension. The appropriate size 4-in-1 cutting block was placed and checked with an angel wing and cuts were made. Posterior femoral osteophytes and uncapped bone were then removed with the curved  osteotome.  The menisci were removed.  Trial components were placed, and stability was checked in full extension, mid-flexion, and deep flexion.  I liked the 11  mm poly insert.  PCL was resected to balance the flexion space as it was a little tight. Proper tibial rotation was determined and marked.  The patella tracked well without a lateral release.  The femoral lugs were then drilled. Trial components were then removed and tibial preparation performed.  The tibial was osteopenic.  The trial tibia was pointed to the medial third of the tibial tubercle.  The tibia was sized for a size E component and prepared for a 30 mm cemented stem.  Trial components were removed.    The bony surfaces were irrigated with a pulse lavage and then dried. Bone cement was vacuum mixed on the back table, and the final components sized above were cemented into place.  Antibiotic irrigation was placed in the knee joint and soft tissues while the cement cured.  After cement had finished curing, excess cement was removed. The stability of the construct was re-evaluated throughout a range of motion and found to be acceptable. The trial liner was removed, the knee was copiously irrigated, and the knee was re-evaluated for any excess bone debris. The real polyethylene liner, 11 mm thick, was inserted and checked to ensure the locking mechanism had engaged appropriately. The tourniquet was deflated and hemostasis was achieved. The wound was irrigated with normal saline.  One gram of vancomycin  powder was placed in the surgical bed.  Topical mixture of 0.25% bupivacaine  and meloxicam  was placed in the joint for postoperative pain.  Capsular closure was performed with a #1 stratafix in flexion, subcutaneous fat closed with a 0 vicryl suture, then subcutaneous tissue closed with interrupted 2.0 vicryl suture. The skin was then closed with a 2.0 nylon and dermabond. A sterile dressing was applied.  The patient was awakened in the operating room and taken to recovery in stable condition. All sponge, needle, and instrument counts were correct at the end of the case.  Morna Grave was necessary for  opening, closing, retracting, limb positioning and overall facilitation and completion of the surgery.  Position: supine  Complications: none.  Time Out: performed   Drains/Packing: none Estimated blood loss: minimal Returned to Recovery Room: in good condition.   Mechanical VTE (DVT) Prophylaxis: sequential compression devices, TED thigh-high  Chemical VTE (DVT) Prophylaxis: eliquis  POD 1  Fluid Replacement  Crystalloid: see anesthesia record Blood: none  FFP: none   Specimens Removed: 1 to pathology  Sponge and Instrument Count Correct? yes  PACU: portable radiograph - knee AP and Lateral  Plan/RTC: Return in 2 weeks for suture removal.  Weight Bearing/Load Lower Extremity: full   Implant Name Type Inv. Item Serial No. Manufacturer Lot No. LRB No. Used Action  CEMENT BONE REFOBACIN [id number] US  - [id number] Cement CEMENT BONE REFOBACIN [id number] US   ZIMMER RECON(ORTH,TRAU,BIO,SG) [id number] Left 2 Implanted  STEM TIBIA 5 DEG SZ E L KNEE - [id number] Knees STEM TIBIA 5 DEG SZ E L KNEE  [employer]IMMER [employer]ON(ORTH,TRAU,BIO,SG) [id number] Left 1 Implanted  STEM TIB ST PERS 14+30 - [id number] Stem STEM TIB ST PERS 14+30  [employer]IMMER [employer]ON(ORTH,TRAU,BIO,SG) [id number] Left 1 Implanted  FEMORAL KNEE COMP SZ 8STD LT - [id number] Knees FEMORAL KNEE COMP SZ 8STD LT  [employer]MER [employer]ON(ORTH,TRAU,BIO,SG) [id number] Left 1 Implanted  INSERT MED AS PERS SZ 8-11 LT - [id number] Insert INSERT MED AS PERS SZ 8-11 LT  [employer]MER [employer]ON(ORTH,TRAU,BIO,SG) [id number] Left 1  Implanted  STEM POLY PAT PLY 46M KNEE - [id number] Knees STEM POLY PAT PLY 46M KNEE  ZIMMER RECON(ORTH,TRAU,BIO,SG) [id number] Left 1 Implanted    N. Ozell Cummins, MD [facility] 11:25 AM  "

## 2024-04-10 ENCOUNTER — Encounter: Admitting: Orthopaedic Surgery
